# Patient Record
Sex: Male | Born: 1960 | Race: Black or African American | Hispanic: No | Marital: Single | Smoking: Current every day smoker
Health system: Southern US, Community
[De-identification: ages and names within clinical notes are randomized; demographics above are authoritative.]

## PROBLEM LIST (undated history)

## (undated) DIAGNOSIS — Z720 Tobacco use: Secondary | ICD-10-CM

## (undated) DIAGNOSIS — I639 Cerebral infarction, unspecified: Secondary | ICD-10-CM

## (undated) DIAGNOSIS — I82409 Acute embolism and thrombosis of unspecified deep veins of unspecified lower extremity: Secondary | ICD-10-CM

## (undated) DIAGNOSIS — E669 Obesity, unspecified: Secondary | ICD-10-CM

## (undated) DIAGNOSIS — E1169 Type 2 diabetes mellitus with other specified complication: Secondary | ICD-10-CM

## (undated) DIAGNOSIS — I739 Peripheral vascular disease, unspecified: Secondary | ICD-10-CM

## (undated) DIAGNOSIS — I63332 Cerebral infarction due to thrombosis of left posterior cerebral artery: Secondary | ICD-10-CM

## (undated) DIAGNOSIS — R569 Unspecified convulsions: Secondary | ICD-10-CM

## (undated) DIAGNOSIS — E785 Hyperlipidemia, unspecified: Secondary | ICD-10-CM

## (undated) DIAGNOSIS — I2699 Other pulmonary embolism without acute cor pulmonale: Secondary | ICD-10-CM

## (undated) DIAGNOSIS — F1090 Alcohol use, unspecified, uncomplicated: Secondary | ICD-10-CM

## (undated) DIAGNOSIS — IMO0002 Reserved for concepts with insufficient information to code with codable children: Secondary | ICD-10-CM

## (undated) HISTORY — DX: Cerebral infarction, unspecified: I63.9

## (undated) HISTORY — DX: Hyperlipidemia, unspecified: E78.5

## (undated) HISTORY — DX: Tobacco use: Z72.0

## (undated) HISTORY — DX: Type 2 diabetes mellitus with other specified complication: E66.9

## (undated) HISTORY — DX: Morbid (severe) obesity due to excess calories: E66.01

## (undated) HISTORY — DX: Reserved for concepts with insufficient information to code with codable children: IMO0002

## (undated) HISTORY — DX: Alcohol use, unspecified, uncomplicated: F10.90

## (undated) HISTORY — DX: Cerebral infarction due to thrombosis of left posterior cerebral artery: I63.332

## (undated) HISTORY — DX: Obesity, unspecified: E11.69

---

## 2016-02-04 ENCOUNTER — Other Ambulatory Visit: Payer: Self-pay

## 2016-02-04 ENCOUNTER — Inpatient Hospital Stay (HOSPITAL_COMMUNITY): Payer: Medicaid Other

## 2016-02-04 ENCOUNTER — Emergency Department (HOSPITAL_COMMUNITY): Payer: Medicaid Other

## 2016-02-04 ENCOUNTER — Inpatient Hospital Stay (HOSPITAL_COMMUNITY)
Admission: EM | Admit: 2016-02-04 | Discharge: 2016-02-10 | DRG: 041 | Disposition: A | Discharge status: Skilled Nursing Facility | Payer: Medicaid Other | Attending: Internal Medicine | Admitting: Internal Medicine

## 2016-02-04 ENCOUNTER — Encounter (HOSPITAL_COMMUNITY): Payer: Self-pay | Admitting: Emergency Medicine

## 2016-02-04 DIAGNOSIS — R7989 Other specified abnormal findings of blood chemistry: Secondary | ICD-10-CM | POA: Diagnosis not present

## 2016-02-04 DIAGNOSIS — E1169 Type 2 diabetes mellitus with other specified complication: Secondary | ICD-10-CM

## 2016-02-04 DIAGNOSIS — Z6835 Body mass index (BMI) 35.0-35.9, adult: Secondary | ICD-10-CM | POA: Diagnosis not present

## 2016-02-04 DIAGNOSIS — F1290 Cannabis use, unspecified, uncomplicated: Secondary | ICD-10-CM | POA: Diagnosis present

## 2016-02-04 DIAGNOSIS — Z720 Tobacco use: Secondary | ICD-10-CM | POA: Diagnosis present

## 2016-02-04 DIAGNOSIS — R262 Difficulty in walking, not elsewhere classified: Secondary | ICD-10-CM | POA: Diagnosis present

## 2016-02-04 DIAGNOSIS — R531 Weakness: Secondary | ICD-10-CM

## 2016-02-04 DIAGNOSIS — R471 Dysarthria and anarthria: Secondary | ICD-10-CM | POA: Diagnosis present

## 2016-02-04 DIAGNOSIS — G8191 Hemiplegia, unspecified affecting right dominant side: Secondary | ICD-10-CM | POA: Diagnosis present

## 2016-02-04 DIAGNOSIS — E041 Nontoxic single thyroid nodule: Secondary | ICD-10-CM | POA: Diagnosis present

## 2016-02-04 DIAGNOSIS — F1090 Alcohol use, unspecified, uncomplicated: Secondary | ICD-10-CM | POA: Diagnosis present

## 2016-02-04 DIAGNOSIS — I6789 Other cerebrovascular disease: Secondary | ICD-10-CM | POA: Diagnosis not present

## 2016-02-04 DIAGNOSIS — R488 Other symbolic dysfunctions: Secondary | ICD-10-CM | POA: Diagnosis present

## 2016-02-04 DIAGNOSIS — I63532 Cerebral infarction due to unspecified occlusion or stenosis of left posterior cerebral artery: Secondary | ICD-10-CM | POA: Diagnosis not present

## 2016-02-04 DIAGNOSIS — I10 Essential (primary) hypertension: Secondary | ICD-10-CM | POA: Diagnosis present

## 2016-02-04 DIAGNOSIS — F17210 Nicotine dependence, cigarettes, uncomplicated: Secondary | ICD-10-CM | POA: Diagnosis present

## 2016-02-04 DIAGNOSIS — R479 Unspecified speech disturbances: Secondary | ICD-10-CM | POA: Diagnosis present

## 2016-02-04 DIAGNOSIS — E785 Hyperlipidemia, unspecified: Secondary | ICD-10-CM | POA: Diagnosis present

## 2016-02-04 DIAGNOSIS — I639 Cerebral infarction, unspecified: Secondary | ICD-10-CM | POA: Diagnosis present

## 2016-02-04 DIAGNOSIS — R4701 Aphasia: Secondary | ICD-10-CM | POA: Diagnosis present

## 2016-02-04 DIAGNOSIS — I63432 Cerebral infarction due to embolism of left posterior cerebral artery: Secondary | ICD-10-CM | POA: Insufficient documentation

## 2016-02-04 DIAGNOSIS — E119 Type 2 diabetes mellitus without complications: Secondary | ICD-10-CM | POA: Diagnosis present

## 2016-02-04 DIAGNOSIS — F1099 Alcohol use, unspecified with unspecified alcohol-induced disorder: Secondary | ICD-10-CM | POA: Diagnosis not present

## 2016-02-04 DIAGNOSIS — H5340 Unspecified visual field defects: Secondary | ICD-10-CM | POA: Diagnosis present

## 2016-02-04 DIAGNOSIS — IMO0002 Reserved for concepts with insufficient information to code with codable children: Secondary | ICD-10-CM | POA: Diagnosis present

## 2016-02-04 DIAGNOSIS — R809 Proteinuria, unspecified: Secondary | ICD-10-CM | POA: Diagnosis present

## 2016-02-04 DIAGNOSIS — F1020 Alcohol dependence, uncomplicated: Secondary | ICD-10-CM

## 2016-02-04 DIAGNOSIS — E669 Obesity, unspecified: Secondary | ICD-10-CM

## 2016-02-04 DIAGNOSIS — I63332 Cerebral infarction due to thrombosis of left posterior cerebral artery: Secondary | ICD-10-CM | POA: Diagnosis present

## 2016-02-04 HISTORY — DX: Cerebral infarction, unspecified: I63.9

## 2016-02-04 LAB — HEPATIC FUNCTION PANEL
ALT: 25 U/L (ref 10–40)
AST: 47 U/L — AB (ref 14–40)
Alkaline Phosphatase: 55 U/L (ref 25–125)
BILIRUBIN, TOTAL: 1.7 mg/dL

## 2016-02-04 LAB — COMPREHENSIVE METABOLIC PANEL
ALBUMIN: 4.3 g/dL (ref 3.5–5.0)
ALK PHOS: 55 U/L (ref 38–126)
ALT: 25 U/L (ref 17–63)
AST: 47 U/L — AB (ref 15–41)
Anion gap: 9 (ref 5–15)
BILIRUBIN TOTAL: 1.7 mg/dL — AB (ref 0.3–1.2)
BUN: 15 mg/dL (ref 6–20)
CO2: 23 mmol/L (ref 22–32)
CREATININE: 0.82 mg/dL (ref 0.61–1.24)
Calcium: 9.2 mg/dL (ref 8.9–10.3)
Chloride: 104 mmol/L (ref 101–111)
GFR calc Af Amer: >60 mL/min (ref >60)
GLUCOSE: 139 mg/dL — AB (ref 65–99)
POTASSIUM: 4.9 mmol/L (ref 3.5–5.1)
Sodium: 136 mmol/L (ref 135–145)

## 2016-02-04 LAB — URINALYSIS, ROUTINE W REFLEX MICROSCOPIC
BILIRUBIN URINE: NEGATIVE
Glucose, UA: NEGATIVE mg/dL
KETONES UR: NEGATIVE mg/dL
LEUKOCYTES UA: NEGATIVE
NITRITE: NEGATIVE
PH: 5.5 (ref 5.0–8.0)
PROTEIN: 100 mg/dL — AB
Specific Gravity, Urine: 1.036 — ABNORMAL HIGH (ref 1.005–1.030)

## 2016-02-04 LAB — I-STAT TROPONIN, ED: Troponin i, poc: 0 ng/mL (ref 0.00–0.08)

## 2016-02-04 LAB — I-STAT CHEM 8, ED
BUN: 21 mg/dL — AB (ref 6–20)
CALCIUM ION: 1.06 mmol/L — AB (ref 1.12–1.23)
CREATININE: 0.8 mg/dL (ref 0.61–1.24)
Chloride: 104 mmol/L (ref 101–111)
GLUCOSE: 141 mg/dL — AB (ref 65–99)
HEMATOCRIT: 51 % (ref 39.0–52.0)
HEMOGLOBIN: 17.3 g/dL — AB (ref 13.0–17.0)
Potassium: 4.3 mmol/L (ref 3.5–5.1)
Sodium: 138 mmol/L (ref 135–145)
TCO2: 26 mmol/L (ref 0–100)

## 2016-02-04 LAB — CBC AND DIFFERENTIAL
HCT: 47 % (ref 41–53)
Hemoglobin: 16.7 g/dL (ref 13.5–17.5)
PLATELETS: 148 10*3/uL — AB (ref 150–399)
WBC: 9.9 10*3/mL

## 2016-02-04 LAB — CBC
HCT: 45.9 % (ref 39.0–52.0)
HEMATOCRIT: 47.4 % (ref 39.0–52.0)
HEMOGLOBIN: 16.7 g/dL (ref 13.0–17.0)
Hemoglobin: 15.8 g/dL (ref 13.0–17.0)
MCH: 32.3 pg (ref 26.0–34.0)
MCH: 31.7 pg (ref 26.0–34.0)
MCHC: 35.2 g/dL (ref 30.0–36.0)
MCHC: 34.4 g/dL (ref 30.0–36.0)
MCV: 91.7 fL (ref 78.0–100.0)
MCV: 92 fL (ref 78.0–100.0)
PLATELETS: 144 10*3/uL — AB (ref 150–400)
Platelets: 148 10*3/uL — ABNORMAL LOW (ref 150–400)
RBC: 5.17 MIL/uL (ref 4.22–5.81)
RBC: 4.99 MIL/uL (ref 4.22–5.81)
RDW: 13.2 % (ref 11.5–15.5)
RDW: 13.3 % (ref 11.5–15.5)
WBC: 9.9 10*3/uL (ref 4.0–10.5)
WBC: 9.6 10*3/uL (ref 4.0–10.5)

## 2016-02-04 LAB — DIFFERENTIAL
BASOS ABS: 0 10*3/uL (ref 0.0–0.1)
Basophils Relative: 0 %
EOS ABS: 0.1 10*3/uL (ref 0.0–0.7)
Eosinophils Relative: 1 %
LYMPHS ABS: 2.2 10*3/uL (ref 0.7–4.0)
LYMPHS PCT: 22 %
MONOS PCT: 6 %
Monocytes Absolute: 0.6 10*3/uL (ref 0.1–1.0)
NEUTROS ABS: 7.2 10*3/uL (ref 1.7–7.7)
NEUTROS PCT: 71 %

## 2016-02-04 LAB — TROPONIN I: Troponin I: 0.03 ng/mL (ref <0.031)

## 2016-02-04 LAB — BASIC METABOLIC PANEL
BUN: 15 mg/dL (ref 4–21)
CREATININE: 0.8 mg/dL (ref 0.6–1.3)
GLUCOSE: 139 mg/dL
Potassium: 4.9 mmol/L (ref 3.4–5.3)
Sodium: 136 mmol/L — AB (ref 137–147)

## 2016-02-04 LAB — RAPID URINE DRUG SCREEN, HOSP PERFORMED
Amphetamines: NOT DETECTED
Barbiturates: NOT DETECTED
Benzodiazepines: NOT DETECTED
Cocaine: NOT DETECTED
OPIATES: NOT DETECTED
TETRAHYDROCANNABINOL: POSITIVE — AB

## 2016-02-04 LAB — CREATININE, SERUM
Creatinine, Ser: 0.88 mg/dL (ref 0.61–1.24)
GFR calc Af Amer: >60 mL/min (ref >60)
GFR calc non Af Amer: >60 mL/min (ref >60)

## 2016-02-04 LAB — PROTIME-INR
INR: 1.01 (ref 0.00–1.49)
Prothrombin Time: 13.5 seconds (ref 11.6–15.2)

## 2016-02-04 LAB — APTT: APTT: 29 s (ref 24–37)

## 2016-02-04 LAB — URINE MICROSCOPIC-ADD ON: Bacteria, UA: NONE SEEN

## 2016-02-04 LAB — ETHANOL: Alcohol, Ethyl (B): <5 mg/dL (ref <5)

## 2016-02-04 LAB — CBG MONITORING, ED: Glucose-Capillary: 137 mg/dL — ABNORMAL HIGH (ref 65–99)

## 2016-02-04 MED ORDER — VITAMIN B-1 100 MG PO TABS
100.0000 mg | ORAL_TABLET | Freq: Every day | ORAL | Status: DC
  Administered 2016-02-05 – 2016-02-10 (×5): 100 mg via ORAL
  Filled 2016-02-04 (×6): qty 1

## 2016-02-04 MED ORDER — ACETAMINOPHEN 325 MG PO TABS
650.0000 mg | ORAL_TABLET | Freq: Four times a day (QID) | ORAL | Status: DC | PRN
  Administered 2016-02-04: 650 mg via ORAL
  Filled 2016-02-04: qty 2

## 2016-02-04 MED ORDER — NICOTINE 21 MG/24HR TD PT24
21.0000 mg | MEDICATED_PATCH | Freq: Every day | TRANSDERMAL | Status: DC
Start: 2016-02-04 — End: 2016-02-10
  Administered 2016-02-04 – 2016-02-10 (×7): 21 mg via TRANSDERMAL
  Filled 2016-02-04 (×7): qty 1

## 2016-02-04 MED ORDER — ACETAMINOPHEN 500 MG PO TABS: 1000.0000 mg | ORAL_TABLET | Freq: Three times a day (TID) | ORAL | Status: DC | PRN

## 2016-02-04 MED ORDER — IBUPROFEN 200 MG PO TABS
600.0000 mg | ORAL_TABLET | Freq: Four times a day (QID) | ORAL | Status: DC | PRN
  Administered 2016-02-04 – 2016-02-06 (×4): 600 mg via ORAL
  Filled 2016-02-04 (×4): qty 3

## 2016-02-04 MED ORDER — SODIUM CHLORIDE 0.9 % IV SOLN: INTRAVENOUS | Status: AC

## 2016-02-04 MED ORDER — ENOXAPARIN SODIUM 60 MG/0.6ML ~~LOC~~ SOLN
0.5000 mg/kg | SUBCUTANEOUS | Status: DC
  Administered 2016-02-04 – 2016-02-08 (×5): 60 mg via SUBCUTANEOUS
  Filled 2016-02-04 (×7): qty 0.6

## 2016-02-04 MED ORDER — FENTANYL CITRATE (PF) 100 MCG/2ML IJ SOLN
50.0000 ug | Freq: Once | INTRAMUSCULAR | Status: AC
  Administered 2016-02-04: 50 ug via INTRAVENOUS
  Filled 2016-02-04: qty 2

## 2016-02-04 MED ORDER — LORAZEPAM 1 MG PO TABS: 0.0000 mg | ORAL_TABLET | Freq: Two times a day (BID) | ORAL | Status: DC

## 2016-02-04 MED ORDER — ADULT MULTIVITAMIN W/MINERALS CH
1.0000 | ORAL_TABLET | Freq: Every day | ORAL | Status: DC
  Administered 2016-02-05 – 2016-02-07 (×3): 1 via ORAL
  Filled 2016-02-04 (×3): qty 1

## 2016-02-04 MED ORDER — METOCLOPRAMIDE HCL 5 MG/ML IJ SOLN
10.0000 mg | Freq: Once | INTRAMUSCULAR | Status: AC
  Administered 2016-02-04: 10 mg via INTRAVENOUS
  Filled 2016-02-04: qty 2

## 2016-02-04 MED ORDER — ASPIRIN 325 MG PO TABS
325.0000 mg | ORAL_TABLET | Freq: Every day | ORAL | Status: DC
  Administered 2016-02-04 – 2016-02-10 (×7): 325 mg via ORAL
  Filled 2016-02-04 (×7): qty 1

## 2016-02-04 MED ORDER — THIAMINE HCL 100 MG/ML IJ SOLN: 100.0000 mg | Freq: Every day | INTRAMUSCULAR | Status: DC

## 2016-02-04 MED ORDER — FOLIC ACID 1 MG PO TABS
1.0000 mg | ORAL_TABLET | Freq: Every day | ORAL | Status: DC
  Administered 2016-02-05 – 2016-02-07 (×3): 1 mg via ORAL
  Filled 2016-02-04 (×3): qty 1

## 2016-02-04 MED ORDER — LORAZEPAM 2 MG/ML IJ SOLN: 1.0000 mg | Freq: Four times a day (QID) | INTRAMUSCULAR | Status: DC | PRN

## 2016-02-04 MED ORDER — LORAZEPAM 1 MG PO TABS
1.0000 mg | ORAL_TABLET | Freq: Four times a day (QID) | ORAL | Status: DC | PRN
  Administered 2016-02-05 – 2016-02-06 (×2): 1 mg via ORAL

## 2016-02-04 MED ORDER — LORAZEPAM 1 MG PO TABS
0.0000 mg | ORAL_TABLET | Freq: Four times a day (QID) | ORAL | Status: AC
  Administered 2016-02-05: 2 mg via ORAL
  Administered 2016-02-06: 1 mg via ORAL
  Filled 2016-02-04: qty 2
  Filled 2016-02-04: qty 1

## 2016-02-04 MED ORDER — IOPAMIDOL (ISOVUE-370) INJECTION 76%
INTRAVENOUS | Status: AC
  Administered 2016-02-04: 50 mL
  Filled 2016-02-04: qty 50

## 2016-02-04 MED ORDER — STROKE: EARLY STAGES OF RECOVERY BOOK
Freq: Once | Status: AC
  Administered 2016-02-04: 22:00:00
  Filled 2016-02-04: qty 1

## 2016-02-04 MED ORDER — SODIUM CHLORIDE 0.9 % IV SOLN: 100.0000 mL/h | INTRAVENOUS | Status: DC

## 2016-02-04 MED ORDER — ENOXAPARIN SODIUM 40 MG/0.4ML ~~LOC~~ SOLN: 40.0000 mg | SUBCUTANEOUS | Status: DC

## 2016-02-04 MED ORDER — SENNOSIDES-DOCUSATE SODIUM 8.6-50 MG PO TABS: 1.0000 | ORAL_TABLET | Freq: Every evening | ORAL | Status: DC | PRN

## 2016-02-04 MED ORDER — ASPIRIN 300 MG RE SUPP: 300.0000 mg | Freq: Every day | RECTAL | Status: DC

## 2016-02-04 MED ORDER — SODIUM CHLORIDE 0.9 % IV BOLUS (SEPSIS)
500.0000 mL | Freq: Once | INTRAVENOUS | Status: AC
  Administered 2016-02-04: 500 mL via INTRAVENOUS

## 2016-02-04 NOTE — Progress Notes (Signed)
Patient admitted to room 5M11 at 1750. Alert and in stable condition.

## 2016-02-04 NOTE — ED Notes (Signed)
Pt arrives via [facility] for c/o right arm weakness that began 2 weeks ago, pt reports on Monday his right leg became weak and he was unable to walk. Pt presents with some slurred speech. Ems reports hand grips equal bilaterally and no leg drift was present.

## 2016-02-04 NOTE — ED Notes (Signed)
Dr. Patel at bedside 

## 2016-02-04 NOTE — ED Notes (Signed)
Patient called out for urinal and was moving all extremities at this time and has no further complaints. Asked to turn lights off and close curtains behind

## 2016-02-04 NOTE — Progress Notes (Signed)
CSW engaged with Patient's sister in law outside of Patient's room at Patient's sister in law's request. Patient's sister expressed concerns with Patient not having any insurance. CSW explained that Patient will be contacted by a financial counselor once inpatient. Patient's sister inquired about rehab or home health services upon discharge. CSW explained the SNF process. Patient to be admitted for further evaluation and management. During that time, Patient to be assessed by PT and OT to determine disposition. Patient's sister in law appreciative of CSW's visit. No further needs/concerns at this time.          Lance MussAshley Gardner,MSW, LCSW [facility] ED/50M Clinical Social Worker [phone]

## 2016-02-04 NOTE — H&P (Signed)
Date: 02/04/2016               Patient Name:  Ronald Forbes MRN: [id number]  DOB: [birth year] Age / Sex: 55 y.o., male   PCP: No primary care provider on file.         Medical Service: Internal Medicine Teaching Service         Attending Physician: Dr. Aldine Contes, MD    First Contact: Dr. Zada Finders Pager: [phone]  Second Contact: Dr. Charlott Rakes Pager: [phone]       After Hours (After 5p/  First Contact Pager: [phone]  weekends / holidays): Second Contact Pager: [phone]   Chief Complaint: "I cant move my right side"  History of Present Illness:  Ronald Forbes is a 55 year old male with PMH of alcohol and tobacco use who presented to the ED with slurred speech and right sided weakness. History is limited due to patient status. Patient states that he began to have severe headaches about 2 weeks ago that were not relieved with OTC tylenol. He says the headaches progressively worsened and he noticed numbness/tingling and some weakness in his right side. He says this slowly worsened until yesterday when he was unable to move his right arm or leg. He reports slurred speech with trouble speaking his intended words, but understands when spoken to and recognizes when he is saying words he does not mean to say. He reports decreased sensation on the right side of his face.  In the ED, CT head showed a large cortical infarct involving the posterior left parietal and occipital lobes extending into the posterior left temporal lobe, likely subacute. An old infarct of the left basal ganglia was also seen. CTA Head and Neck showed a large subacute infarct of the left PCA territory. The left posterior cerebral artery is occluded. A 2 cm right thyroid lesion was incidentally seen.  Social Hx: 1 PPD for 40 years, drinks 6 pack daily, occasional marijuana, no cocaine/heroin/IVDU  Family Hx: No known family history.  Meds: Current Facility-Administered Medications  Medication Dose Route  Frequency Provider Last Rate Last Dose  .  stroke: mapping our early stages of recovery book   Does not apply Once Zada Finders, MD      . 0.9 %  sodium chloride infusion  100 mL/hr Intravenous Continuous Carmin Muskrat, MD      . 0.9 %  sodium chloride infusion   Intravenous Continuous Zada Finders, MD      . aspirin suppository 300 mg  300 mg Rectal Daily Zada Finders, MD       Or  . aspirin tablet 325 mg  325 mg Oral Daily Zada Finders, MD   325 mg at 02/04/16 1641  . enoxaparin (LOVENOX) injection 60 mg  0.5 mg/kg Subcutaneous Q24H Nischal Narendra, MD      . folic acid (FOLVITE) tablet 1 mg  1 mg Oral Daily Riccardo Dubin, MD      . ibuprofen (ADVIL,MOTRIN) tablet 600 mg  600 mg Oral Q6H PRN Zada Finders, MD      . LORazepam (ATIVAN) tablet 1 mg  1 mg Oral Q6H PRN Riccardo Dubin, MD       Or  . LORazepam (ATIVAN) injection 1 mg  1 mg Intravenous Q6H PRN Riccardo Dubin, MD      . LORazepam (ATIVAN) tablet 0-4 mg  0-4 mg Oral Q6H Riccardo Dubin, MD       Followed by  . [  START ON 02/06/2016] LORazepam (ATIVAN) tablet 0-4 mg  0-4 mg Oral Q12H Riccardo Dubin, MD      . metoCLOPramide (REGLAN) injection 10 mg  10 mg Intravenous Once Greta Doom, MD      . multivitamin with minerals tablet 1 tablet  1 tablet Oral Daily Riccardo Dubin, MD      . nicotine (NICODERM CQ - dosed in mg/24 hours) patch 21 mg  21 mg Transdermal Daily Zada Finders, MD   21 mg at 02/04/16 1642  . senna-docusate (Senokot-S) tablet 1 tablet  1 tablet Oral QHS PRN Zada Finders, MD      . thiamine (VITAMIN B-1) tablet 100 mg  100 mg Oral Daily Riccardo Dubin, MD       Or  . thiamine (B-1) injection 100 mg  100 mg Intravenous Daily Riccardo Dubin, MD        Allergies: Allergies as of 02/04/2016  . (No Known Allergies)   History reviewed. No pertinent past medical history. History reviewed. No pertinent past surgical history. No family history on file. Social History   Social History  . Marital Status:  Single    Spouse Name: N/A  . Number of Children: N/A  . Years of Education: N/A   Occupational History  . Not on file.   Social History Main Topics  . Smoking status: Current Every Day Smoker -- 1.00 packs/day for 40 years    Types: Cigarettes  . Smokeless tobacco: Not on file  . Alcohol Use: 0.0 oz/week    0 Standard drinks or equivalent per week     Comment: 6 pack/day  . Drug Use: Yes    Special: Marijuana     Comment: occasional marijuana  . Sexual Activity: Not on file   Other Topics Concern  . Not on file   Social History Narrative   Lives alone   Used to work as a Dealer    Review of Systems: Review of Systems  Constitutional: Negative for fever and chills.  HENT: Negative for hearing loss and tinnitus.   Eyes: Negative for blurred vision and double vision.  Respiratory: Negative for cough, sputum production and shortness of breath.   Cardiovascular: Negative for chest pain, palpitations and leg swelling.  Gastrointestinal: Negative for nausea, vomiting, abdominal pain, diarrhea and constipation.  Genitourinary: Negative for dysuria and frequency.  Musculoskeletal: Negative for myalgias and falls.  Neurological: Positive for tingling, sensory change, speech change, focal weakness, weakness and headaches. Negative for dizziness, seizures and loss of consciousness.  Psychiatric/Behavioral: Negative for memory loss.     Physical Exam: Blood pressure 156/78, pulse 58, temperature 98.2 F (36.8 C), temperature source Oral, resp. rate 20, height _0  (1.905 m), weight 269 lb 6.4 oz (122.2 kg), SpO2 97 %. Physical Exam  Constitutional: He appears well-developed and well-nourished.  Left pupil more constricted compared to right, both reactive to light, EOMI  HENT:  Head: Normocephalic and atraumatic.  Mouth/Throat: Oropharynx is clear and moist.  Neck: Normal range of motion.  Cardiovascular: Normal rate and regular rhythm.   No murmur heard. Pulmonary/Chest:  Effort normal. No respiratory distress. He has no wheezes. He has no rales.  Abdominal: Soft. Bowel sounds are normal. He exhibits no distension. There is no tenderness. There is no guarding.  Musculoskeletal: He exhibits no edema or tenderness.  Neurological: He is alert.  Oriented to city and hospital, birth month and day, says "2003" for birth year and current year. Speech is  slurred with occasional aphasia. Slight right sided facial droop, decreased sensation on right side of face. RUE 3/5, RLE 3/5, LUE and LLE 5/5, diminished plantar and dorsiflexion on RLE, unable to illicit PT reflexes, finger to nose and rapid finger tap intact on left, decreased ability on right due to weakness. Gait not assessed due to fall risk.  Skin: He is not diaphoretic.     Lab results: Basic Metabolic Panel:  Recent Labs  02/04/16 0849 02/04/16 0859 02/04/16 1601  NA 136 138  --   K 4.9 4.3  --   CL 104 104  --   CO2 23  --   --   GLUCOSE 139* 141*  --   BUN 15 21*  --   CREATININE 0.82 0.80 0.88  CALCIUM 9.2  --   --    Liver Function Tests:  Recent Labs  02/04/16 0849  AST 47*  ALT 25  ALKPHOS 55  BILITOT 1.7*  PROT >12.0*  ALBUMIN 4.3   No results for input(s): LIPASE, AMYLASE in the last 72 hours. No results for input(s): AMMONIA in the last 72 hours. CBC:  Recent Labs  02/04/16 0849 02/04/16 0859 02/04/16 1601  WBC 9.9  --  9.6  NEUTROABS 7.2  --   --   HGB 16.7 17.3* 15.8  HCT 47.4 51.0 45.9  MCV 91.7  --  92.0  PLT 148*  --  144*   Cardiac Enzymes:  Recent Labs  02/04/16 0849  TROPONINI 0.03   BNP: No results for input(s): PROBNP in the last 72 hours. D-Dimer: No results for input(s): DDIMER in the last 72 hours. CBG:  Recent Labs  02/04/16 0850  GLUCAP 137*   Hemoglobin A1C: No results for input(s): HGBA1C in the last 72 hours. Fasting Lipid Panel: No results for input(s): CHOL, HDL, LDLCALC, TRIG, CHOLHDL, LDLDIRECT in the last 72 hours. Thyroid  Function Tests: No results for input(s): TSH, T4TOTAL, FREET4, T3FREE, THYROIDAB in the last 72 hours. Anemia Panel: No results for input(s): VITAMINB12, FOLATE, FERRITIN, TIBC, IRON, RETICCTPCT in the last 72 hours. Coagulation:  Recent Labs  02/04/16 0849  LABPROT 13.5  INR 1.01   Urine Drug Screen: Drugs of Abuse     Component Value Date/Time   LABOPIA NONE DETECTED 02/04/2016 1025   COCAINSCRNUR NONE DETECTED 02/04/2016 1025   LABBENZ NONE DETECTED 02/04/2016 1025   AMPHETMU NONE DETECTED 02/04/2016 1025   THCU POSITIVE* 02/04/2016 1025   LABBARB NONE DETECTED 02/04/2016 1025    Alcohol Level:  Recent Labs  02/04/16 1102  ETH <5   Urinalysis:  Recent Labs  02/04/16 1025  COLORURINE YELLOW  LABSPEC 1.036*  PHURINE 5.5  GLUCOSEU NEGATIVE  HGBUR TRACE*  BILIRUBINUR NEGATIVE  KETONESUR NEGATIVE  PROTEINUR 100*  NITRITE NEGATIVE  LEUKOCYTESUR NEGATIVE     Imaging results:  Ct Angio Head W Or Wo Contrast  02/04/2016  CLINICAL DATA:  Stroke. Right-sided weakness 2 weeks. Slurred speech. EXAM: CT ANGIOGRAPHY HEAD AND NECK TECHNIQUE: Multidetector CT imaging of the head and neck was performed using the standard protocol during bolus administration of intravenous contrast. Multiplanar CT image reconstructions and MIPs were obtained to evaluate the vascular anatomy. Carotid stenosis measurements (when applicable) are obtained utilizing NASCET criteria, using the distal internal carotid diameter as the denominator. CONTRAST:  50 mL Isovue 370 IV COMPARISON:  CT 02/04/2016 FINDINGS: CTA NECK Aortic arch: Normal aortic arch. Lung apices clear. Proximal great vessels widely patent. Right carotid system: Mild atherosclerotic disease  at the right carotid bifurcation without stenosis. No dissection Left carotid system: Mild atherosclerotic disease at the bifurcation without stenosis or dissection. Vertebral arteries:Right vertebral artery dominant. Right vertebral artery widely  patent to the basilar. Left vertebral artery is small and ends in PICA without significant contribution to the basilar. Skeleton: Cervical degenerative change.  No fracture or bony lesion. Other neck: Right thyroid lesion measuring 20 mm. This has irregular margins and is noncalcified. Thyroid ultrasound and possible biopsy recommended to rule out neoplasm. CTA HEAD Anterior circulation: Mild atherosclerotic calcified gauge in the cavernous carotid bilaterally without significant stenosis. Anterior and middle cerebral arteries widely patent bilaterally without stenosis. Posterior circulation: Right vertebral artery patent to the basilar. Right PICA patent. Left vertebral artery hypoplastic ending in PICA without significant contribution to the basilar. Basilar widely patent. Superior cerebellar artery patent bilaterally. Posterior communicating artery patent bilaterally. Right posterior cerebral artery widely patent Left posterior cerebral artery occluded at the origin with possible faint reconstitution distally. Large subacute infarct left PCA territory. Venous sinuses: Patent Anatomic variants: negative for cerebral aneurysm Delayed phase: Slight enhancement along the periphery of the left PCA infarct compatible with subacute infarction. No enhancing mass lesion. IMPRESSION: Large subacute infarct left PCA territory Left posterior cerebral artery is occluded with faint reconstitution in the midportion. No significant carotid or vertebral artery stenosis in the neck. 2 cm right thyroid lesion. Recommend thyroid ultrasound and biopsy to rule out neoplasm. Electronically Signed   By: Franchot Gallo M.D.   On: 02/04/2016 10:34   Ct Head Wo Contrast  02/04/2016  CLINICAL DATA:  Code stroke earlier today. Right-sided weakness for 2 weeks. Left PCA infarct. EXAM: CT HEAD WITHOUT CONTRAST TECHNIQUE: Contiguous axial images were obtained from the base of the skull through the vertex without intravenous contrast.  COMPARISON:  CTA earlier same date. FINDINGS: 1708 hours. Brain: Large subacute infarct in the left posterior cerebral artery distribution is again noted, involving the medial left temporal, parietal and occipital lobes. This has not significantly changed compared with the prior examination. There are chronic small vessel ischemic changes in the left basal ganglia. There is no midline shift, hydrocephalus or evidence of acute intracranial hemorrhage. Intracranial vascular calcifications are noted. Bones/sinuses/visualized face: The visualized paranasal sinuses, mastoid air cells and middle ears are clear. Cold lamina papyracea deformity on the right is noted. The calvarium is intact. IMPRESSION: 1. Stable large subacute left PCA distribution infarct from examination earlier today. 2. No new findings. Electronically Signed   By: Richardean Sale M.D.   On: 02/04/2016 17:39   Ct Head Wo Contrast  02/04/2016  ADDENDUM REPORT: 02/04/2016 09:49 ADDENDUM: Findings discussed with Dr. Leonel Ramsay on 02/04/2016 at 0945 hours. Electronically Signed   By: Lavonia Dana M.D.   On: 02/04/2016 09:49  02/04/2016  CLINICAL DATA:  Slurred speech, stroke symptoms, onset 2 weeks ago but worse this morning EXAM: CT HEAD WITHOUT CONTRAST TECHNIQUE: Contiguous axial images were obtained from the base of the skull through the vertex without intravenous contrast. COMPARISON:  None FINDINGS: Normal ventricular morphology. No midline shift. Large area of low attenuation involving the LEFT occipital and posterior parietal regions compatible with a LEFT PCA territory infarct. This extends into the posterior LEFT temporal lobe. Mild mass effect upon the atrium of the LEFT lateral ventricle. Old lacunar infarct anterior LEFT basal ganglia into anterior limb LEFT internal capsule. No intracranial hemorrhage or mass lesion. Posterior fossa unremarkable. No extra-axial fluid collections. Bones and sinuses unremarkable. IMPRESSION: Large area of  cortical infarction involving the posterior LEFT parietal and LEFT occipital lobes extending into posterior LEFT temporal lobe, probably subacute in age. Old appearing lacunar infarct anterior LEFT basal ganglia into the anterior limb of LEFT internal capsule. No midline shift or intracranial hemorrhage identified. Electronically Signed: By: Lavonia Dana M.D. On: 02/04/2016 09:32   Ct Angio Neck W Or Wo Contrast  02/04/2016  CLINICAL DATA:  Stroke. Right-sided weakness 2 weeks. Slurred speech. EXAM: CT ANGIOGRAPHY HEAD AND NECK TECHNIQUE: Multidetector CT imaging of the head and neck was performed using the standard protocol during bolus administration of intravenous contrast. Multiplanar CT image reconstructions and MIPs were obtained to evaluate the vascular anatomy. Carotid stenosis measurements (when applicable) are obtained utilizing NASCET criteria, using the distal internal carotid diameter as the denominator. CONTRAST:  50 mL Isovue 370 IV COMPARISON:  CT 02/04/2016 FINDINGS: CTA NECK Aortic arch: Normal aortic arch. Lung apices clear. Proximal great vessels widely patent. Right carotid system: Mild atherosclerotic disease at the right carotid bifurcation without stenosis. No dissection Left carotid system: Mild atherosclerotic disease at the bifurcation without stenosis or dissection. Vertebral arteries:Right vertebral artery dominant. Right vertebral artery widely patent to the basilar. Left vertebral artery is small and ends in PICA without significant contribution to the basilar. Skeleton: Cervical degenerative change.  No fracture or bony lesion. Other neck: Right thyroid lesion measuring 20 mm. This has irregular margins and is noncalcified. Thyroid ultrasound and possible biopsy recommended to rule out neoplasm. CTA HEAD Anterior circulation: Mild atherosclerotic calcified gauge in the cavernous carotid bilaterally without significant stenosis. Anterior and middle cerebral arteries widely patent  bilaterally without stenosis. Posterior circulation: Right vertebral artery patent to the basilar. Right PICA patent. Left vertebral artery hypoplastic ending in PICA without significant contribution to the basilar. Basilar widely patent. Superior cerebellar artery patent bilaterally. Posterior communicating artery patent bilaterally. Right posterior cerebral artery widely patent Left posterior cerebral artery occluded at the origin with possible faint reconstitution distally. Large subacute infarct left PCA territory. Venous sinuses: Patent Anatomic variants: negative for cerebral aneurysm Delayed phase: Slight enhancement along the periphery of the left PCA infarct compatible with subacute infarction. No enhancing mass lesion. IMPRESSION: Large subacute infarct left PCA territory Left posterior cerebral artery is occluded with faint reconstitution in the midportion. No significant carotid or vertebral artery stenosis in the neck. 2 cm right thyroid lesion. Recommend thyroid ultrasound and biopsy to rule out neoplasm. Electronically Signed   By: Franchot Gallo M.D.   On: 02/04/2016 10:34    Other results: EKG: sinus rhythm, incomplete RBBB, LAFB.  Assessment & Plan by Problem: Principal Problem:   CVA (cerebral vascular accident) ([place]) Active Problems:   Tobacco abuse   Alcohol use disorder ([place])   Morbid obesity ([place])  Left Occipital Infarct: CTA Head and Neck showed a large subacute infarct of the left PCA territory. The left posterior cerebral artery is occluded, suggesting an atherosclerotic cause. He does not follow with a PCP or have known past medical conditions, but does have risk factor in smoking history. Will continue with stroke workup and risk factor assessment and modification. -MRI Brain w/o contrast -ASA 325 mg -TTE -PT/OT/SLP eval -Check Hg A1C, Lipid panel -Admit to telemetry with neuro checks -Risk factor modification -NS @ 125 mL/hr -Permissive HTN <220/120  Elevated  total protein: AST/ALT is 47/25 with mild increased bilirubin of 1.7 and total protein elevated at >12.0. UA does show proteinuria at 100 mg/dL. Will repeat CMP in morning. Would think about Amyloidosis  or multiple myeloma if consistently elevated. -Repeat CMP -HCV antibody -HIV antibody  Thyroid nodule: 2 cm right thyroid lesion was seen on CTA.  -TSH -Consider thyroid U/S and possible biopsy  EtOH use: Reports drinking a 6 pack nightly, denies history of withdrawal or seizures. -CIWA protocol -Thiamine, Folic Acid, MVM   Tobacco use: Smoked 1 PPD for ~40 years.  -Smoking cessation counseling -Nicotine patch   Dispo: Disposition is deferred at this time, awaiting improvement of current medical problems. Anticipated discharge in approximately 1-3 day(s).   The patient does not have a current PCP (No primary care provider on file.) and does need an [facility] hospital follow-up appointment after discharge.  The patient does not have transportation limitations that hinder transportation to clinic appointments.  Signed: Zada Finders, MD 02/04/2016, 6:20 PM

## 2016-02-04 NOTE — ED Notes (Signed)
Pt now able to move right arm and right leg again. Dr. Amada JupiterKirkpatrick aware.

## 2016-02-04 NOTE — ED Notes (Signed)
Pt given meal tray.

## 2016-02-04 NOTE — ED Provider Notes (Signed)
CSN: [id number]     Arrival date & time 02/04/16  0865 History   First MD Initiated Contact with Patient 02/04/16 [phone]     Chief Complaint  Patient presents with  . Stroke Symptoms    HPI  Patient presents with concern of weakness, new speech difficulty. Symptoms began about 2 weeks ago, initially with right upper and right lower extremity weakness. Over the interval, the symptoms have increased, with difficulty walking. Patient notes that over the past day he has had new dysarthria as well. During this illness he has had no medical attention, and there are no clear alleviating or exacerbating factors. He denies pain, confusion, disorientation. He is here with 2 family members who corroborate the history of present illness.   History reviewed. No pertinent past medical history. History reviewed. No pertinent past surgical history. No family history on file. Social History  Substance Use Topics  . Smoking status: Current Every Day Smoker -- 1.00 packs/day    Types: Cigarettes  . Smokeless tobacco: None  . Alcohol Use: Yes     Comment: 40 oz/ week    Review of Systems  Constitutional:       Per HPI, otherwise negative  HENT:       Per HPI, otherwise negative  Respiratory:       Per HPI, otherwise negative  Cardiovascular:       Per HPI, otherwise negative  Gastrointestinal: Negative for vomiting.  Endocrine:       Negative aside from HPI  Genitourinary:       Neg aside from HPI   Musculoskeletal:       Per HPI, otherwise negative  Skin: Negative.   Neurological: Positive for facial asymmetry, speech difficulty, weakness, light-headedness, numbness and headaches. Negative for syncope.      Allergies  Review of patient's allergies indicates no known allergies.  Home Medications   Prior to Admission medications   Medication Sig Start Date End Date Taking? Authorizing Provider  ibuprofen (ADVIL,MOTRIN) 200 MG tablet Take 800 mg by mouth every 6 (six) hours as  needed for mild pain.   Yes Historical Provider, MD  Multiple Vitamins-Minerals (MULTIVITAMIN WITH MINERALS) tablet Take 1 tablet by mouth daily.   Yes Historical Provider, MD   BP 161/104 mmHg  Pulse 65  Temp(Src) 97.7 F (36.5 C) (Oral)  Resp 22  Ht 6\' 3"  (1.905 m)  Wt 280 lb (127.007 kg)  BMI 35.00 kg/m2  SpO2 94% Physical Exam  Constitutional: He is oriented to person, place, and time. He appears well-developed. No distress.  HENT:  Head: Normocephalic and atraumatic.  Eyes: Conjunctivae and EOM are normal.  Cardiovascular: Normal rate and regular rhythm.   Pulmonary/Chest: Effort normal. No stridor. No respiratory distress.  Abdominal: He exhibits no distension.  Musculoskeletal: He exhibits no edema.  Neurological: He is alert and oriented to person, place, and time. A cranial nerve deficit and sensory deficit is present. He exhibits abnormal muscle tone. He displays no seizure activity. Coordination abnormal.  R facial asym,  NIH 11  Skin: Skin is warm and dry.  Psychiatric: He has a normal mood and affect.  Nursing note and vitals reviewed.   ED Course  Procedures (including critical care time) Labs Review Labs Reviewed  CBC - Abnormal; Notable for the following:    Platelets 148 (*)    All other components within normal limits  COMPREHENSIVE METABOLIC PANEL - Abnormal; Notable for the following:    Glucose, Bld 139 (*)  Total Protein >12.0 (*)    AST 47 (*)    Total Bilirubin 1.7 (*)    All other components within normal limits  CBG MONITORING, ED - Abnormal; Notable for the following:    Glucose-Capillary 137 (*)    All other components within normal limits  I-STAT CHEM 8, ED - Abnormal; Notable for the following:    BUN 21 (*)    Glucose, Bld 141 (*)    Calcium, Ion 1.06 (*)    Hemoglobin 17.3 (*)    All other components within normal limits  PROTIME-INR  APTT  DIFFERENTIAL  TROPONIN I  ETHANOL  URINE RAPID DRUG SCREEN, HOSP PERFORMED  URINALYSIS,  ROUTINE W REFLEX MICROSCOPIC (NOT AT [facility])  I-STAT TROPOININ, ED  CBG MONITORING, ED    Imaging Review Ct Angio Head W Or Wo Contrast  02/04/2016  CLINICAL DATA:  Stroke. Right-sided weakness 2 weeks. Slurred speech. EXAM: CT ANGIOGRAPHY HEAD AND NECK TECHNIQUE: Multidetector CT imaging of the head and neck was performed using the standard protocol during bolus administration of intravenous contrast. Multiplanar CT image reconstructions and MIPs were obtained to evaluate the vascular anatomy. Carotid stenosis measurements (when applicable) are obtained utilizing NASCET criteria, using the distal internal carotid diameter as the denominator. CONTRAST:  50 mL Isovue 370 IV COMPARISON:  CT 02/04/2016 FINDINGS: CTA NECK Aortic arch: Normal aortic arch. Lung apices clear. Proximal great vessels widely patent. Right carotid system: Mild atherosclerotic disease at the right carotid bifurcation without stenosis. No dissection Left carotid system: Mild atherosclerotic disease at the bifurcation without stenosis or dissection. Vertebral arteries:Right vertebral artery dominant. Right vertebral artery widely patent to the basilar. Left vertebral artery is small and ends in PICA without significant contribution to the basilar. Skeleton: Cervical degenerative change.  No fracture or bony lesion. Other neck: Right thyroid lesion measuring 20 mm. This has irregular margins and is noncalcified. Thyroid ultrasound and possible biopsy recommended to rule out neoplasm. CTA HEAD Anterior circulation: Mild atherosclerotic calcified gauge in the cavernous carotid bilaterally without significant stenosis. Anterior and middle cerebral arteries widely patent bilaterally without stenosis. Posterior circulation: Right vertebral artery patent to the basilar. Right PICA patent. Left vertebral artery hypoplastic ending in PICA without significant contribution to the basilar. Basilar widely patent. Superior cerebellar artery patent  bilaterally. Posterior communicating artery patent bilaterally. Right posterior cerebral artery widely patent Left posterior cerebral artery occluded at the origin with possible faint reconstitution distally. Large subacute infarct left PCA territory. Venous sinuses: Patent Anatomic variants: negative for cerebral aneurysm Delayed phase: Slight enhancement along the periphery of the left PCA infarct compatible with subacute infarction. No enhancing mass lesion. IMPRESSION: Large subacute infarct left PCA territory Left posterior cerebral artery is occluded with faint reconstitution in the midportion. No significant carotid or vertebral artery stenosis in the neck. 2 cm right thyroid lesion. Recommend thyroid ultrasound and biopsy to rule out neoplasm. Electronically Signed   By: Marlan Palau M.D.   On: 02/04/2016 10:34   Ct Head Wo Contrast  02/04/2016  ADDENDUM REPORT: 02/04/2016 09:49 ADDENDUM: Findings discussed with Dr. Amada Jupiter on 02/04/2016 at 0945 hours. Electronically Signed   By: Ulyses Southward M.D.   On: 02/04/2016 09:49  02/04/2016  CLINICAL DATA:  Slurred speech, stroke symptoms, onset 2 weeks ago but worse this morning EXAM: CT HEAD WITHOUT CONTRAST TECHNIQUE: Contiguous axial images were obtained from the base of the skull through the vertex without intravenous contrast. COMPARISON:  None FINDINGS: Normal ventricular morphology. No midline shift.  Large area of low attenuation involving the LEFT occipital and posterior parietal regions compatible with a LEFT PCA territory infarct. This extends into the posterior LEFT temporal lobe. Mild mass effect upon the atrium of the LEFT lateral ventricle. Old lacunar infarct anterior LEFT basal ganglia into anterior limb LEFT internal capsule. No intracranial hemorrhage or mass lesion. Posterior fossa unremarkable. No extra-axial fluid collections. Bones and sinuses unremarkable. IMPRESSION: Large area of cortical infarction involving the posterior LEFT  parietal and LEFT occipital lobes extending into posterior LEFT temporal lobe, probably subacute in age. Old appearing lacunar infarct anterior LEFT basal ganglia into the anterior limb of LEFT internal capsule. No midline shift or intracranial hemorrhage identified. Electronically Signed: By: Ulyses Southward M.D. On: 02/04/2016 09:32   Ct Angio Neck W Or Wo Contrast  02/04/2016  CLINICAL DATA:  Stroke. Right-sided weakness 2 weeks. Slurred speech. EXAM: CT ANGIOGRAPHY HEAD AND NECK TECHNIQUE: Multidetector CT imaging of the head and neck was performed using the standard protocol during bolus administration of intravenous contrast. Multiplanar CT image reconstructions and MIPs were obtained to evaluate the vascular anatomy. Carotid stenosis measurements (when applicable) are obtained utilizing NASCET criteria, using the distal internal carotid diameter as the denominator. CONTRAST:  50 mL Isovue 370 IV COMPARISON:  CT 02/04/2016 FINDINGS: CTA NECK Aortic arch: Normal aortic arch. Lung apices clear. Proximal great vessels widely patent. Right carotid system: Mild atherosclerotic disease at the right carotid bifurcation without stenosis. No dissection Left carotid system: Mild atherosclerotic disease at the bifurcation without stenosis or dissection. Vertebral arteries:Right vertebral artery dominant. Right vertebral artery widely patent to the basilar. Left vertebral artery is small and ends in PICA without significant contribution to the basilar. Skeleton: Cervical degenerative change.  No fracture or bony lesion. Other neck: Right thyroid lesion measuring 20 mm. This has irregular margins and is noncalcified. Thyroid ultrasound and possible biopsy recommended to rule out neoplasm. CTA HEAD Anterior circulation: Mild atherosclerotic calcified gauge in the cavernous carotid bilaterally without significant stenosis. Anterior and middle cerebral arteries widely patent bilaterally without stenosis. Posterior circulation:  Right vertebral artery patent to the basilar. Right PICA patent. Left vertebral artery hypoplastic ending in PICA without significant contribution to the basilar. Basilar widely patent. Superior cerebellar artery patent bilaterally. Posterior communicating artery patent bilaterally. Right posterior cerebral artery widely patent Left posterior cerebral artery occluded at the origin with possible faint reconstitution distally. Large subacute infarct left PCA territory. Venous sinuses: Patent Anatomic variants: negative for cerebral aneurysm Delayed phase: Slight enhancement along the periphery of the left PCA infarct compatible with subacute infarction. No enhancing mass lesion. IMPRESSION: Large subacute infarct left PCA territory Left posterior cerebral artery is occluded with faint reconstitution in the midportion. No significant carotid or vertebral artery stenosis in the neck. 2 cm right thyroid lesion. Recommend thyroid ultrasound and biopsy to rule out neoplasm. Electronically Signed   By: Marlan Palau M.D.   On: 02/04/2016 10:34   I have personally reviewed and evaluated these images and lab results as part of my medical decision-making.   EKG Interpretation   Date/Time:  Wednesday February 04 2016 08:44:37 EDT Ventricular Rate:  63 PR Interval:    QRS Duration: 117 QT Interval:  422 QTC Calculation: 432 R Axis:   -46 Text Interpretation:  Sinus rhythm Incomplete RBBB and LAFB Artifact  Abnormal ekg Confirmed by Gerhard Munch  MD [phone]) on 02/04/2016 9:40:11  AM     I discussed patient's case with our neurology team after my initial  evaluation. Of concern for stroke, integration of transformation from ischemic to hemorrhagic given his description of new speech deficit, patient was a code stroke.   Update: Patient does not have hemorrhagic findings on CT, though CT demonstrates. MDM   Final diagnoses:  Stroke (cerebrum) (HCC)  Stroke (cerebrum) [facility](HCC)  Patient presents with new  right-sided weakness, speech difficulty, is found to have stroke. Patient's case discussed with neurology, patient required admission for further evaluation, management.   Gerhard Munchobert Quentina Fronek, MD 02/04/16 1052

## 2016-02-04 NOTE — ED Notes (Signed)
Dr. Jeraldine LootsLockwood at bedside to assess patient.

## 2016-02-04 NOTE — ED Notes (Signed)
Pt returning to room from CT with Marsh DollyBrooke RN, Pharmacy, Dr. Amada JupiterKirkpatrick and Ronaldo MiyamotoKyle EMT at bedside.

## 2016-02-04 NOTE — Consult Note (Signed)
Neurology Consultation Reason for Consult: Stroke Referring Physician: Jeraldine Loots, R  CC: right arm weakness  History is obtained from:patient  HPI: Ronald Forbes is a 55 y.o. male who does not go to the doctor who presents with right arm weakness/slurred speech worse today, but present over the past 2 weeks. He states that his symptoms got worse today and this ws what prompted him to come to the emergency room.    LKW: about 2 weeks ago tpa given?: no, out of window.     ROS: A 14 point ROS was performed and is negative except as noted in the HPI.   PMH: Does not see doctors  FHx: Mother - stroke  Social History:  reports that he has been smoking Cigarettes.  He has been smoking about 1.00 pack per day. He does not have any smokeless tobacco history on file. He reports that he drinks alcohol. He reports that he uses illicit drugs (Marijuana). Drinks 1 -2 40 oz beers per day  Exam: Current vital signs: BP 150/88 mmHg  Pulse 64  Temp(Src) 97.7 F (36.5 C) (Oral)  Resp 13  Ht  (1.905 m)  Wt 127.007 kg (280 lb)  BMI 35.00 kg/m2  SpO2 94% Vital signs in last 24 hours: Temp:  [97.7 F (36.5 C)] 97.7 F (36.5 C) (06/21 0849) Pulse Rate:  [64] 64 (06/21 0849) Resp:  [13] 13 (06/21 0849) BP: (150)/(88) 150/88 mmHg (06/21 0849) SpO2:  [94 %] 94 % (06/21 0849) Weight:  [127.007 kg (280 lb)] 127.007 kg (280 lb) (06/21 0849)   Physical Exam  Constitutional: Appears well-developed and well-nourished.  Psych: Affect appropriate to situation Eyes: No scleral injection HENT: No OP obstrucion Head: Normocephalic.  Cardiovascular: Normal rate and regular rhythm.  Respiratory: Effort normal and breath sounds normal to anterior ascultation GI: Soft.  No distension. There is no tenderness.  Skin: WDI  Neuro: Mental Status: Patient is awake, alert, oriented to person, place, month, year, and situation. Patient is able to give a clear and coherent history. No signs of  aphasia or neglect Cranial Nerves: II: R hemianopia. Pupils are equal, round, and reactive to light.   III,IV, VI: EOMI without ptosis or diploplia.  V: Facial sensation is decreased on the right VII: Facial movement is symmetric.  VIII: hearing is intact to voice X: Uvula elevates symmetrically XI: Shoulder shrug is symmetric. XII: tongue is midline without atrophy or fasciculations.  Motor: Tone is normal. Bulk is normal. 5/5 strength was present on the left, on the right he has 3/5 proximally and 4-/5 distally. In his leg he has 4-/5 strength throughout.  Sensory: Diminished throughout the right side.  Cerebellar: FNF intact on the left   I have reviewed labs in epic and the results pertinent to this consultation are: cmp - milldy elevated bilirubin/AST, otherwise unremarkable  I have reviewed the images obtained:CT head - left occipital infarct  Impression: 55 yo M with left occipital infarct, possibilities include large vessel athero(PCA) vs embolism. Will need further workup and PT. Concern for extension this AM.    Recommendations: 1. HgbA1c, fasting lipid panel 2. MRI brain without contrast, no MRA needed 3. Frequent neuro checks 4. Echocardiogram 5. CT angio head and neck 6. Prophylactic therapy-Antiplatelet med: Aspirin - dose  PO or  PR 7. Risk factor modification 8. Telemetry monitoring 9. PT consult, OT consult, Speech consult 10. please page stroke NP  Or  PA  Or MD  M-F from 8am -4 pm starting  6/22 as this patient will be followed by the stroke team at this point.   You can look them up on [email or web address]      Ritta SlotMcNeill Naryah Clenney, MD [facility] [phone]  If 7pm- 7am, please page neurology on call as listed in AMION.

## 2016-02-05 ENCOUNTER — Inpatient Hospital Stay (HOSPITAL_COMMUNITY): Payer: Medicaid Other

## 2016-02-05 DIAGNOSIS — Z720 Tobacco use: Secondary | ICD-10-CM

## 2016-02-05 DIAGNOSIS — E785 Hyperlipidemia, unspecified: Secondary | ICD-10-CM

## 2016-02-05 DIAGNOSIS — I639 Cerebral infarction, unspecified: Secondary | ICD-10-CM

## 2016-02-05 DIAGNOSIS — F1099 Alcohol use, unspecified with unspecified alcohol-induced disorder: Secondary | ICD-10-CM

## 2016-02-05 DIAGNOSIS — I6789 Other cerebrovascular disease: Secondary | ICD-10-CM

## 2016-02-05 LAB — ECHOCARDIOGRAM COMPLETE
CHL CUP MV DEC (S): 352
E decel time: 352 msec
FS: 28 % (ref 28–44)
Height: 75 in
IV/PV OW: 1.38
LA diam end sys: 32 mm
LA diam index: 1.29 cm/m2
LA vol A4C: 55.9 ml
LA vol: 65.1 mL
LASIZE: 32 mm
LAVOLIN: 26.1 mL/m2
LDCA: 3.46 cm2
LV TDI E'MEDIAL: 6.09
LV e' LATERAL: 9.14 cm/s
LVOTD: 21 mm
MVPKEVEL: 0.6 m/s
PW: 8 mm — AB (ref 0.6–1.1)
Reg peak vel: 224 cm/s
TAPSE: 23.3 mm
TDI e' lateral: 9.14
TRMAXVEL: 224 cm/s
Weight: 4310.43 oz

## 2016-02-05 LAB — GLUCOSE, CAPILLARY
GLUCOSE-CAPILLARY: 218 mg/dL — AB (ref 65–99)
Glucose-Capillary: 133 mg/dL — ABNORMAL HIGH (ref 65–99)
Glucose-Capillary: 177 mg/dL — ABNORMAL HIGH (ref 65–99)

## 2016-02-05 LAB — COMPREHENSIVE METABOLIC PANEL
ALBUMIN: 3.9 g/dL (ref 3.5–5.0)
ALT: 29 U/L (ref 17–63)
ANION GAP: 9 (ref 5–15)
AST: 23 U/L (ref 15–41)
Alkaline Phosphatase: 53 U/L (ref 38–126)
BILIRUBIN TOTAL: 0.9 mg/dL (ref 0.3–1.2)
BUN: 15 mg/dL (ref 6–20)
CHLORIDE: 103 mmol/L (ref 101–111)
CO2: 23 mmol/L (ref 22–32)
Calcium: 9.2 mg/dL (ref 8.9–10.3)
Creatinine, Ser: 0.91 mg/dL (ref 0.61–1.24)
GFR calc Af Amer: >60 mL/min (ref >60)
GLUCOSE: 131 mg/dL — AB (ref 65–99)
POTASSIUM: 3.6 mmol/L (ref 3.5–5.1)
Sodium: 135 mmol/L (ref 135–145)
TOTAL PROTEIN: 7.1 g/dL (ref 6.5–8.1)

## 2016-02-05 LAB — LIPID PANEL
Cholesterol: 221 mg/dL — ABNORMAL HIGH (ref 0–200)
HDL: 36 mg/dL — ABNORMAL LOW (ref >40)
LDL CALC: 112 mg/dL — AB (ref 0–99)
TRIGLYCERIDES: 364 mg/dL — AB (ref <150)
Total CHOL/HDL Ratio: 6.1 RATIO
VLDL: 73 mg/dL — AB (ref 0–40)

## 2016-02-05 LAB — SEDIMENTATION RATE: SED RATE: 3 mm/h (ref 0–16)

## 2016-02-05 LAB — CBC
HEMATOCRIT: 46.7 % (ref 39.0–52.0)
HEMOGLOBIN: 15.9 g/dL (ref 13.0–17.0)
MCH: 31.9 pg (ref 26.0–34.0)
MCHC: 34 g/dL (ref 30.0–36.0)
MCV: 93.8 fL (ref 78.0–100.0)
Platelets: 151 10*3/uL (ref 150–400)
RBC: 4.98 MIL/uL (ref 4.22–5.81)
RDW: 13.3 % (ref 11.5–15.5)
WBC: 10.1 10*3/uL (ref 4.0–10.5)

## 2016-02-05 LAB — C-REACTIVE PROTEIN: CRP: 1.2 mg/dL — ABNORMAL HIGH (ref <1.0)

## 2016-02-05 LAB — TSH: TSH: 0.318 u[IU]/mL — ABNORMAL LOW (ref 0.350–4.500)

## 2016-02-05 LAB — RPR: RPR: NONREACTIVE

## 2016-02-05 LAB — VITAMIN B12: VITAMIN B 12: 830 pg/mL (ref 180–914)

## 2016-02-05 LAB — HIV ANTIBODY (ROUTINE TESTING W REFLEX): HIV SCREEN 4TH GENERATION: NONREACTIVE

## 2016-02-05 MED ORDER — ATORVASTATIN CALCIUM 40 MG PO TABS
40.0000 mg | ORAL_TABLET | Freq: Every day | ORAL | Status: DC
  Administered 2016-02-05 – 2016-02-08 (×4): 40 mg via ORAL
  Filled 2016-02-05 (×5): qty 1

## 2016-02-05 NOTE — Progress Notes (Signed)
  Echocardiogram 2D Echocardiogram has been performed.  Arvil ChacoFoster, Iniko Robles 02/05/2016, 3:31 PM

## 2016-02-05 NOTE — Progress Notes (Signed)
   Subjective: Patient feels some improvement this morning however still has significant weakness at his RUE and RLE with intermittent fluent aphasia. Sister-in-law and nephew are present and updated on management and plan going forward. Family states that patient has "peaks and valleys" of his symptoms but no improvement back to his baseline during peaks. Patient reports right sided visual field deficit today.  Objective: Vital signs in last 24 hours: Filed Vitals:   02/05/16 0405 02/05/16 0634 02/05/16 1028 02/05/16 1409  BP: 132/84 143/89 149/88 115/67  Pulse: 59 61 64 65  Temp:   98.8 F (37.1 C) 99.9 F (37.7 C)  TempSrc:   Oral Oral  Resp: 18 22 19 17   Height:      Weight:      SpO2: 92% 94% 96% 95%   Weight change:  No intake or output data in the 24 hours ending 02/05/16 1757   General: resting in bed, no acute distress HEENT: PRRL left more constricted compared to right, right lateral visual field deficit Cardiac: RRR, no rubs, murmurs or gallops Pulm: clear to auscultation bilaterally, moving normal volumes of air Abd: soft, nontender, nondistended, BS present Ext: warm and well perfused, no pedal edema Neuro: alert and oriented to person, knows birthdate, cannot say year, fluent aphasia, RUE 1/5, RLE 3/5, LUE and LLE 5/5   Assessment/Plan: Principal Problem:   CVA (cerebral vascular accident) (HCC) Active Problems:   Tobacco abuse   Alcohol use disorder (HCC)   Morbid obesity (HCC)  Left PCA territory and midbrain infarct: CTA Head and Neck showed a large subacute infarct of the left PCA territory. The left posterior cerebral artery is occluded, suggesting an atherosclerotic cause. MRI shows a large acute/subacute PCA territory infarct and acute left midbrain infarct. TTE with EF 55-60%, no PFO. Neurology has arranged for TEE on 6/23. Patient with continued right sided weakness, fluent aphasia, and right lateral visual field deficit. -Neurology following, appreciate  assistance -ASA 325 mg -Atorvastatin 40 mg daily -PT/OT/SLP eval recommending CIR. -Hgb A1C pending -Lipid panel: Cholesterol 221, Triglycerides 364, HDL 36, LDL 112 -Admit to telemetry with neuro checks -Risk factor modification -Permissive HTN <220/120  Elevated total protein: On admission, AST/ALT is 47/25 with mild increased bilirubin of 1.7 and total protein elevated at >12.0. UA does show proteinuria at 100 mg/dL. Repeat CMP has normalized. -HCV antibody pending -HIV antibody negative  Thyroid nodule: 2 cm right thyroid lesion was seen on CTA. TSH slightly low at 0.318.  -Check free T4, T3 -Consider thyroid U/S and possible biopsy outpatient  EtOH use: Reports drinking a 6 pack nightly, denies history of withdrawal or seizures. -CIWA protocol -Thiamine, Folic Acid, MVM  Tobacco use: Smoked 1 PPD for ~40 years.  -Smoking cessation counseling -Nicotine patch  Dispo: Disposition is deferred at this time, awaiting improvement of current medical problems.  Anticipated discharge in approximately 2-3 day(s).   The patient does not have a current PCP (No primary care provider on file.) and [facility].  The patient does have transportation limitations that hinder transportation to clinic appointments.    LOS: 1 day   Darreld McleanVishal Shataya Winkles, MD 02/05/2016, 5:57 PM

## 2016-02-05 NOTE — Progress Notes (Signed)
VASCULAR LAB PRELIMINARY  PRELIMINARY  PRELIMINARY  PRELIMINARY  Bilateral lower extremity venous duplex has been completed.     Bilateral:  No evidence of DVT, superficial thrombosis, or Baker's Cyst.   Jenetta Logesami Brittish Bolinger, RVT, RDMS 02/05/2016, 12:40 PM

## 2016-02-05 NOTE — Evaluation (Addendum)
Speech Language Pathology Evaluation Patient Details Name: Ronald Forbes MRN: [id number] DOB: 12-05-1960 Today's Date: 02/05/2016 Time: 1030-1050 SLP Time Calculation (min) (ACUTE ONLY): 20 min  Problem List:  Patient Active Problem List   Diagnosis Date Noted  . Tobacco abuse 02/04/2016  . Alcohol use disorder (HCC) 02/04/2016  . Morbid obesity (HCC) 02/04/2016  . CVA (cerebral vascular accident) (HCC) 02/04/2016   Past Medical History: History reviewed. No pertinent past medical history. Past Surgical History: History reviewed. No pertinent past surgical history. HPI:  Pt is a [age] y.o. male with PMH of alcohol/ tobacco abuse, presented to ED on 6/21 with slurred speech and R side weakness. Pt reports slurred speech and trouble speaking intended words but understands when spoken to and recognizes when saying words not intended. Also reports decreased sensaiton on R side of face. MRI showed large subacute L PCA territory infarct affecting posterior temporal/ medial occipital lobes and much of thalamus, early hemorrhagic transformation; additional acute infarct of L midbrain- cerebral peduncle. Bedside swallow eval ordered- pt passed RN stroke swallow screen but RN reported concerns with alertness levels.    Assessment / Plan / Recommendation Clinical Impression  Pt currently presenting with a fluent aphasia. Speech in conversation containing frequent semantic paraphasias and neologisms. Pt was able to name objects in environment 2 out of 6 correct- phonemic/ semantic cues not helpful in word retrieval. No impairments in sentence repetition. Able to follow 1-step directions 100% accuracy, 2-step directions 50% accuracy. Pt aware of speech errors on most occasions and frustrated- stated "my brain knows it but the right words won't come out". Cognition was difficult to fully assess given aphasia; pt was able to recall recent events and demonstrated adequate selective attention to tasks presented,  along with good awareness of deficits. Speech is slightly slurred due to R side weakness but intelligible. Pt will benefit from continued speech therapy to address receptive and expressive language skills needed for safety and functional communication in the acute care setting, and will likely benefit from continued therapy at next level of care. Will continue to follow.      SLP Assessment  Patient needs continued Speech Lanaguage Pathology Services    Follow Up Recommendations  Inpatient Rehab    Frequency and Duration min 2x/week  2 weeks      SLP Evaluation Prior Functioning  Cognitive/Linguistic Baseline: Within functional limits Type of Home: House   Cognition  Overall Cognitive Status: Difficult to assess Arousal/Alertness: Awake/alert Orientation Level: Oriented to person;Other (comment) (difficult to assess other orientation ?s due to aphasia) Attention: Selective Selective Attention: Appears intact Memory: Appears intact (with limited assessment) Awareness: Appears intact Safety/Judgment: Appears intact    Comprehension  Auditory Comprehension Overall Auditory Comprehension: Impaired Yes/No Questions: Within Functional Limits Commands: Impaired One Step Basic Commands: 75-100% accurate Two Step Basic Commands: 50-74% accurate Conversation: Simple EffectiveTechniques: Repetition;Visual/Gestural cues Reading Comprehension Reading Status: Impaired Word level: Impaired    Expression Expression Primary Mode of Expression: Verbal Verbal Expression Overall Verbal Expression: Impaired Initiation: No impairment Level of Generative/Spontaneous Verbalization: Conversation Repetition: No impairment Naming: Impairment Confrontation: Impaired Convergent: 25-49% accurate Verbal Errors: Neologisms;Perseveration;Semantic paraphasias Pragmatics: No impairment Effective Techniques:  (attempted phonemic/ semantic cues- ineffective) Non-Verbal Means of Communication: Not  applicable Written Expression Dominant Hand: Right Written Expression: Unable to assess (comment)   Oral / Motor  Oral Motor/Sensory Function Overall Oral Motor/Sensory Function: Mild impairment Facial ROM: Reduced right Facial Symmetry: Abnormal symmetry right Facial Strength: Reduced right Facial Sensation: Within Functional Limits Lingual  ROM: Within Functional Limits Lingual Symmetry: Within Functional Limits Lingual Strength: Within Functional Limits Motor Speech Overall Motor Speech: Appears within functional limits for tasks assessed Respiration: Within functional limits Phonation: Normal Resonance: Within functional limits Articulation: Impaired Level of Impairment: Conversation Intelligibility: Intelligible Motor Planning: Witnin functional limits Motor Speech Errors: Not applicable   GO                    Ronald Forbes, Amy K, MA, CCC-SLP 02/05/2016, 11:04 AM [phone]

## 2016-02-05 NOTE — Evaluation (Signed)
Clinical/Bedside Swallow Evaluation Patient Details  Name: Ronald Forbes MRN: [id number] Date of Birth: 14-Apr-1961  Today's Date: 02/05/2016 Time: SLP Start Time (ACUTE ONLY): 1030 SLP Stop Time (ACUTE ONLY): 1050 SLP Time Calculation (min) (ACUTE ONLY): 20 min  Past Medical History: History reviewed. No pertinent past medical history. Past Surgical History: History reviewed. No pertinent past surgical history. HPI:  Pt is a 55 y.o. male with PMH of alcohol/ tobacco abuse, presented to ED on 6/21 with slurred speech and R side weakness. Pt reports slurred speech and trouble speaking intended words but understands when spoken to and recognizes when saying words not intended. Also reports decreased sensaiton on R side of face. MRI showed large subacute L PCA territory infarct affecting posterior temporal/ medial occipital lobes and much of thalamus, early hemorrhagic transformation; additional acute infarct of L midbrain- cerebral peduncle. Bedside swallow eval ordered- pt passed RN stroke swallow screen but RN reported concerns with alertness levels.    Assessment / Plan / Recommendation Clinical Impression  Pt showed no overt s/s of aspiration across consistencies. No pocketing or oral residuals noted despite R side weakness. Aspiration risk appears mild. Recommend continuing regulr diet, thin liquids, meds whole with liquid. Reviewed general aspiration precautions with pt- chew foods thoroughly, small bites/ sips, sit upright. Pt may need assistance with meal set-up for feeding on left side. SLP will sign off for swallowing order but will continue to follow for speech/ language.     Aspiration Risk  Mild aspiration risk    Diet Recommendation Regular;Thin liquid   Liquid Administration via: Cup;Straw Medication Administration: Whole meds with liquid Supervision: Patient able to self feed;Comment;Intermittent supervision to cue for compensatory strategies (staff assist with set-up for feeding  on L side) Compensations: Slow rate;Small sips/bites Postural Changes: Seated upright at 90 degrees    Other  Recommendations Oral Care Recommendations: Oral care BID   Follow up Recommendations  Other (comment) (none for swallowing; will cont to follow for speech/ languag)    Frequency and Duration            Prognosis        Swallow Study   General HPI: Pt is a 55 y.o. male with PMH of alcohol/ tobacco abuse, presented to ED on 6/21 with slurred speech and R side weakness. Pt reports slurred speech and trouble speaking intended words but understands when spoken to and recognizes when saying words not intended. Also reports decreased sensaiton on R side of face. MRI showed large subacute L PCA territory infarct affecting posterior temporal/ medial occipital lobes and much of thalamus, early hemorrhagic transformation; additional acute infarct of L midbrain- cerebral peduncle. Bedside swallow eval ordered- pt passed RN stroke swallow screen but RN reported concerns with alertness levels.  Type of Study: Bedside Swallow Evaluation Diet Prior to this Study: Regular;Thin liquids Temperature Spikes Noted: No Respiratory Status: Room air History of Recent Intubation: No Behavior/Cognition: Alert;Cooperative Oral Cavity Assessment: Within Functional Limits Oral Care Completed by SLP: No Oral Cavity - Dentition: Adequate natural dentition Vision: Functional for self-feeding Self-Feeding Abilities: Able to feed self (some assist due to R side weakness) Patient Positioning: Upright in bed Baseline Vocal Quality: Normal Volitional Cough: Strong Volitional Swallow: Able to elicit    Oral/Motor/Sensory Function Overall Oral Motor/Sensory Function: Mild impairment Facial ROM: Reduced right Facial Symmetry: Abnormal symmetry right Facial Strength: Reduced right Facial Sensation: Within Functional Limits Lingual ROM: Within Functional Limits Lingual Symmetry: Within Functional  Limits Lingual Strength: Within Functional Limits  Ice Chips Ice chips: Not tested   Thin Liquid Thin Liquid: Within functional limits Presentation: Cup;Straw    Nectar Thick Nectar Thick Liquid: Not tested   Honey Thick Honey Thick Liquid: Not tested   Puree Puree: Within functional limits Presentation: Self Fed;Spoon   Solid   GO   Solid: Within functional limits Presentation: Self Fed       Ronald Forbes Ronald Forbes K, MA, CCC-SLP 02/05/2016,10:52 AM [phone]

## 2016-02-05 NOTE — Evaluation (Signed)
Physical Therapy Evaluation Patient Details Name: Ronald Forbes MRN: [id number] DOB: Jan 11, 1961 Today's Date: 02/05/2016   History of Present Illness  pt is a 55 y/o male with h/o smoking, marijuana use, admitted to ED with slurred speech/ expressive aphasia and Right sided weakness. 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  Clinical Impression  Pt admitted with/for R sided weakness, MRI showing large L parieto-occipal infarct.  Pt currently limited functionally due to the problems listed. ( See problems list.)   Pt will benefit from PT to maximize function and safety in order to get ready for next venue listed below.     Follow Up Recommendations CIR    Equipment Recommendations  Other (comment) (TBA next venue)    Recommendations for Other Services Rehab consult     Precautions / Restrictions Precautions Precautions: Fall      Mobility  Bed Mobility Overal bed mobility: Needs Assistance;+2 for physical assistance Bed Mobility: Rolling;Sidelying to Sit;Sit to Supine Rolling: Min assist Sidelying to sit: Min assist;+2 for safety/equipment   Sit to supine: Mod assist;+2 for safety/equipment   General bed mobility comments: cues for technique, stability assist  Transfers Overall transfer level: Needs assistance   Transfers: Sit to/from Stand Sit to Stand: Mod assist;+2 physical assistance         General transfer comment: assist to come forward, stay symmetrical, stabilize R UE and R knee.  Ambulation/Gait             General Gait Details: NT  Stairs            Wheelchair Mobility    Modified Rankin (Stroke Patients Only) Modified Rankin (Stroke Patients Only) Pre-Morbid Rankin Score: No symptoms Modified Rankin: Severe disability     Balance Overall balance assessment: Needs assistance Sitting-balance support: No upper extremity supported Sitting balance-Leahy Scale: Fair Sitting balance -  Comments: Uncoordinated, but could maintain sitting EOB without assist,    Standing balance support: Single extremity supported;Bilateral upper extremity supported Standing balance-Leahy Scale: Poor Standing balance comment: stood at EOB with 2 person assist for 1-2 min working on w/shifting to left, stability of R side, weightbearing through R UE/hand                             Pertinent Vitals/Pain Pain Assessment: No/denies pain    Home Living Family/patient expects to be discharged to:: Private residence Living Arrangements: Alone Available Help at Discharge: Family;Available PRN/intermittently Type of Home: Apartment Home Access: Level entry     Home Layout: One level Home Equipment: None      Prior Function Level of Independence: Independent         Comments: 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[employer], educationalmaintenance     Hand Dominance   Dominant Hand: Right    Extremity/Trunk Assessment   Upper Extremity Assessment: Defer to OT evaluation           Lower Extremity Assessment: RLE deficits/detail RLE Deficits / Details: brunstrom 1       Communication   Communication: Expressive difficulties  Cognition Arousal/Alertness: Awake/alert Behavior During Therapy: WFL for tasks assessed/performed Overall Cognitive Status: Impaired/Different from baseline Area of Impairment: Memory     Memory: Decreased short-term memory              General Comments      Exercises        Assessment/Plan    PT Assessment Patient needs continued  PT services  PT Diagnosis Hemiplegia dominant side   PT Problem List Decreased strength;Decreased activity tolerance;Decreased balance;Decreased mobility;Decreased coordination;Decreased cognition;Decreased knowledge of use of DME;Decreased safety awareness;Decreased knowledge of precautions;Impaired sensation  PT Treatment Interventions DME instruction;Gait training;Functional mobility training;Therapeutic activities;Therapeutic  exercise;Neuromuscular re-education;Balance training;Patient/family education   PT Goals (Current goals can be found in the Care Plan section) Acute Rehab PT Goals Patient Stated Goal: get better, independent PT Goal Formulation: With patient Time For Goal Achievement: 02/19/16 Potential to Achieve Goals: Good    Frequency Min 3X/week   Barriers to discharge Decreased caregiver support      Co-evaluation               End of Session   Activity Tolerance: Patient tolerated treatment well Patient left: in bed;with call bell/phone within reach Nurse Communication: Mobility status         Time: [id number] PT Time Calculation (min) (ACUTE ONLY): 32 min   Charges:   PT Evaluation $PT Eval Moderate Complexity: 1 Procedure     PT G Codes:        [name], [name] 02/05/2016, 4:56 PM 02/05/2016  [name], PT [phone]  (pager)

## 2016-02-05 NOTE — Progress Notes (Signed)
    [facility] has been requested to perform a transesophageal echocardiogram on 02/06/2016 with Dr. Eden EmmsNishan for CVA.  After careful review of history and examination, the risks and benefits of transesophageal echocardiogram have been explained including risks of esophageal damage, perforation (1:10,000 risk), bleeding, pharyngeal hematoma as well as other potential complications associated with conscious sedation including aspiration, arrhythmia, respiratory failure and death. Alternatives to treatment were discussed, questions were answered. Patient is willing to proceed.   I have reviewed lab work, Hgb 15.9, Platelets 151,000. His blood pressure has been stable without the need for pressers.   Laverda PageLindsay Jemar Paulsen, NP-C 02/05/2016 4:00 PM

## 2016-02-05 NOTE — Progress Notes (Signed)
STROKE TEAM PROGRESS NOTE   HISTORY OF PRESENT ILLNESS (per record) Ronald Forbes is a 55 y.o. male who does not go to the doctor who presents with right arm weakness/slurred speech worse today, but present over the past 2 weeks. He states that his symptoms got worse today and this was what prompted him to come to the emergency room. He was LKW about 2 weeks ago. Patient was not administered IV t-PA secondary to delay in arrival. He was admitted for further evaluation and treatment.   SUBJECTIVE (INTERVAL HISTORY) No family is at the bedside.  He is awake, lying in the bed. Overall he feels his condition is stable. He is frustrated with his language. He still has right hemianopia, right  Hemisensory loss and hemiplegia. He needs TEE and loop.   OBJECTIVE Temp:  [97.8 F (36.6 C)-98.8 F (37.1 C)] 98.8 F (37.1 C) (06/22 1028) Pulse Rate:  [52-67] 64 (06/22 1028) Cardiac Rhythm:  [-] Normal sinus rhythm (06/22 0700) Resp:  [14-27] 19 (06/22 1028) BP: (119-156)/(69-96) 149/88 mmHg (06/22 1028) SpO2:  [88 %-97 %] 96 % (06/22 1028)  CBC:  Recent Labs Lab 02/04/16 0849  02/04/16 1601 02/05/16 0001  WBC 9.9  --  9.6 10.1  NEUTROABS 7.2  --   --   --   HGB 16.7  < > 15.8 15.9  HCT 47.4  < > 45.9 46.7  MCV 91.7  --  92.0 93.8  PLT 148*  --  144* 151  < > = values in this interval not displayed.  Basic Metabolic Panel:   Recent Labs Lab 02/04/16 0849 02/04/16 0859 02/04/16 1601 02/05/16 0001  NA 136 138  --  135  K 4.9 4.3  --  3.6  CL 104 104  --  103  CO2 23  --   --  23  GLUCOSE 139* 141*  --  131*  BUN 15 21*  --  15  CREATININE 0.82 0.80 0.88 0.91  CALCIUM 9.2  --   --  9.2    Lipid Panel:     Component Value Date/Time   CHOL 221* 02/05/2016 0001   TRIG 364* 02/05/2016 0001   HDL 36* 02/05/2016 0001   CHOLHDL 6.1 02/05/2016 0001   VLDL 73* 02/05/2016 0001   LDLCALC 112* 02/05/2016 0001   HgbA1c: No results found for: HGBA1C Urine Drug Screen:      Component Value Date/Time   LABOPIA NONE DETECTED 02/04/2016 1025   COCAINSCRNUR NONE DETECTED 02/04/2016 1025   LABBENZ NONE DETECTED 02/04/2016 1025   AMPHETMU NONE DETECTED 02/04/2016 1025   THCU POSITIVE* 02/04/2016 1025   LABBARB NONE DETECTED 02/04/2016 1025      IMAGING I have personally reviewed the radiological images below and agree with the radiology interpretations.  Ct Head Wo Contrast 02/04/2016   1. Stable large subacute left PCA distribution infarct from examination earlier today. 2. No new findings.   Ct Head Wo Contrast 02/04/2016  Large area of cortical infarction involving the posterior LEFT parietal and LEFT occipital lobes extending into posterior LEFT temporal lobe, probably subacute in age. Old appearing lacunar infarct anterior LEFT basal ganglia into the anterior limb of LEFT internal capsule. No midline shift or intracranial hemorrhage identified.   Ct Angio Head & Neck W Or Wo Contrast 02/04/2016  Large subacute infarct left PCA territory Left posterior cerebral artery is occluded with faint reconstitution in the midportion. No significant carotid or vertebral artery stenosis in the neck. 2 cm right  thyroid lesion. Recommend thyroid ultrasound and biopsy to rule out neoplasm.   Mr Brain Wo Contrast 02/04/2016  Large acute/subacute LEFT PCA territory infarct. Early hemorrhagic transformation without lobar hematoma. Additional acute infarction of the LEFT midbrain likely accounts for the reported RIGHT hemiparesis. Chronic microvascular ischemic change with areas of chronic lacunar hemorrhagic insult as described.   LE Venous Doppler Bilateral: No evidence of DVT, superficial thrombosis, or Baker's Cyst.  TTE - - Left ventricle: The cavity size was normal. Wall thickness was  increased in a pattern of mild LVH. Systolic function was normal.  The estimated ejection fraction was in the range of 55% to 60%.  Wall motion was normal; there were no regional wall  motion  abnormalities. Doppler parameters are consistent with abnormal  left ventricular relaxation (grade 1 diastolic dysfunction). - Atrial septum: No defect or patent foramen ovale was identified.  PHYSICAL EXAM Physical exam  Temp:  [98.8 F (37.1 C)-99.9 F (37.7 C)] 98.9 F (37.2 C) (06/22 2136) Pulse Rate:  [59-65] 65 (06/22 2136) Resp:  [16-22] 16 (06/22 2136) BP: (115-149)/(61-90) 142/90 mmHg (06/22 2136) SpO2:  [90 %-97 %] 97 % (06/22 2136)  General - Well nourished, well developed, in no apparent distress.  Ophthalmologic - Fundi not visualized due to noncooperation.  Cardiovascular - Regular rate and rhythm.  Mental Status -  Awake, alert, orientated to place, but not orientated to time or people. Language including expression, repetition, comprehension was assessed and found intact, however, has anomia. Fund of Knowledge was assessed and was impaired.  Cranial Nerves II - XII - II - right homonymous hemianopia. III, IV, VI - Extraocular movements intact. V - right facial decreased sensation, 50% of left. VII - right facial droop. VIII - Hearing & vestibular intact bilaterally. X - Palate elevates symmetrically. XI - Chin turning & shoulder shrug intact bilaterally. XII - Tongue protrusion intact.  Motor Strength - The patient's strength was normal in LUE and LLE, and 0/5 RUE and 2/5 RLE.  Bulk was normal and fasciculations were absent.   Motor Tone - Muscle tone was assessed at the neck and appendages and was normal.  Reflexes - The patient's reflexes were 1+ in all extremities and he had no pathological reflexes.  Sensory - Light touch, temperature/pinprick were assessed and were decreased on the right, 20% of left.    Coordination - The patient had normal movements in the left hand with no ataxia or dysmetria.  Tremor was absent.  Gait and Station - not tested due to weakness.   ASSESSMENT/PLAN [name] is a 55 y.o. male with history of  alcohol and cigarette use presenting with right sided weakness. He did not receive IV t-PA due to delay in arrival.   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 2cm R thyroid lesion.  2D Echo  EF 55-60%   LE doppler negative for DVT TEE to look for embolic source. Arranged with [facility] for tomorrow.  If positive for PFO (patent foramen ovale), check bilateral lower extremity venous dopplers to rule out DVT as possible source of stroke. (I have made patient NPO after midnight tonight). If TEE negative, a  [facility] electrophysiologist will consult and consider placement of an implantable loop recorder to evaluate for atrial  fibrillation as etiology of stroke. This has been explained to patient/family by Dr. Roda ShuttersXu and they are agreeable.   LDL 112  HgbA1c pending  Hypercoagulable work up pending  Lovenox 60 mg sq daily for VTE prophylaxis Diet heart healthy/carb modified Room service appropriate?: Yes; Fluid consistency:: Thin  No antithrombotic prior to admission, now on aspirin 325 mg daily.   Patient counseled to be compliant with his antithrombotic medications  Ongoing aggressive stroke risk factor management  Therapy recommendations:  pending   Disposition:  small bowel obstruction   Hypertension  Stable  Permissive hypertension (OK if < 220/120) but gradually normalize in 5-7 days  Long-term BP goal normotensive  Hyperlipidemia  Home meds:  No statin  LDL 112, goal < 70  Now on lipitor 40 mg daily  Continue statin at discharge  Tobacco abuse  Current smoker  Smoking cessation counseling provided  Nicotine patch provided  Pt is willing to quit  Other Stroke Risk Factors  ETOH use, advised to drink no  more than 2 drink(s) a day  Marijuana use, UDS positive for THC on admission  Obesity, Body mass index is 33.67 kg/(m^2)., recommend weight loss, diet and exercise as appropriate   Other Active Problems  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 After hours, contact General Neurology

## 2016-02-05 NOTE — Evaluation (Signed)
Occupational Therapy Evaluation Patient Details Name: Ronald Forbes MRN: [id number] DOB: Feb 08, 1961 Today's Date: 02/05/2016    History of Present Illness pt is a 55 y/o male with h/o smoking, marijuana use, admitted to ED with slurred speech/ expressive aphasia and Right sided weakness. 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   Clinical Impression   Pt reports he was independent with ADLs and mobility PTA. Currently pt requiring mod assist +2 for sit to stand from EOB and mod-max assist for ADLs. Pt presenting with R sided visual deficits, impaired short term memory, and RUE strength/coordination/sensation deficits impacting his independence and safety with ADLs and functional mobility. Pt very agreeable to participate in therapy and eager to regain functional independence. Recommending CIR level therapies to maximize pts independence and safety with ADLs and functional mobility prior to return home. Pt would benefit from continued skilled OT to address established goals.    Follow Up Recommendations  CIR;Supervision/Assistance - 24 hour    Equipment Recommendations  Other (comment) (TBD at next venue)    Recommendations for Other Services Rehab consult     Precautions / Restrictions Precautions Precautions: Fall Restrictions Weight Bearing Restrictions: No      Mobility Bed Mobility Overal bed mobility: Needs Assistance;+2 for physical assistance Bed Mobility: Rolling;Sidelying to Sit;Sit to Supine Rolling: Min assist Sidelying to sit: Min assist;+2 for safety/equipment   Sit to supine: Mod assist;+2 for safety/equipment   General bed mobility comments: cues for technique, stability assist  Transfers Overall transfer level: Needs assistance   Transfers: Sit to/from Stand Sit to Stand: Mod assist;+2 physical assistance         General transfer comment: assist to come forward, stay symmetrical, stabilize R UE and  R knee.    Balance Overall balance assessment: Needs assistance Sitting-balance support: No upper extremity supported Sitting balance-Leahy Scale: Fair Sitting balance - Comments: Uncoordinated, but could maintain sitting EOB without assist,    Standing balance support: Single extremity supported;Bilateral upper extremity supported Standing balance-Leahy Scale: Poor Standing balance comment: stood at EOB with 2 person assist for 1-2 min working on w/shifting to left, stability of R side, weightbearing through R UE/hand                            ADL Overall ADL's : Needs assistance/impaired Eating/Feeding: Moderate assistance;Bed level   Grooming: Minimal assistance;Sitting   Upper Body Bathing: Moderate assistance;Sitting   Lower Body Bathing: Maximal assistance;+2 for physical assistance;Sit to/from stand   Upper Body Dressing : Moderate assistance;Sitting   Lower Body Dressing: Maximal assistance;+2 for physical assistance;Sit to/from stand               Functional mobility during ADLs: Moderate assistance;+2 for physical assistance (for sit to stand only) General ADL Comments: Educated pt on visual compensatory strategies, proper positioning of RUE, providing sensory input to RUE via L hand, attempting to use RUE functionally.     Vision Vision Assessment?: Yes;Vision impaired- to be further tested in functional context Eye Alignment: Impaired (comment) (L eye droop with slight disconjugate gaze) Tracking/Visual Pursuits: Decreased smoothness of horizontal tracking;Decreased smoothness of vertical tracking;Requires cues, head turns, or add eye shifts to track;Impaired - to be further tested in functional context Visual Fields: Right visual field deficit (?homonymous hemianopsia) Additional Comments: Pt reports no visual changes but with formal testing pt unable to see past midline on R side.   Perception  Praxis      Pertinent Vitals/Pain Pain  Assessment: No/denies pain     Hand Dominance Right   Extremity/Trunk Assessment Upper Extremity Assessment Upper Extremity Assessment: RUE deficits/detail RUE Deficits / Details: Overall full PROM. 1 to 2-/5 strength overall. RUE Sensation: decreased light touch RUE Coordination: decreased fine motor;decreased gross motor   Lower Extremity Assessment Lower Extremity Assessment: Defer to PT evaluation    Cervical / Trunk Assessment Cervical / Trunk Assessment: Normal   Communication Communication Communication: Expressive difficulties   Cognition Arousal/Alertness: Awake/alert Behavior During Therapy: WFL for tasks assessed/performed Overall Cognitive Status: Impaired/Different from baseline Area of Impairment: Memory     Memory: Decreased short-term memory             General Comments       Exercises       Shoulder Instructions      Home Living Family/patient expects to be discharged to:: Private residence Living Arrangements: Alone Available Help at Discharge: Family;Available PRN/intermittently Type of Home: Apartment Home Access: Level entry     Home Layout: One level     Bathroom Shower/Tub: Chief Strategy OfficerTub/shower unit   Bathroom Toilet: Standard     Home Equipment: None          Prior Functioning/Environment Level of Independence: Independent        Comments: 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maintenance    OT Diagnosis: Generalized weakness;Cognitive deficits;Acute pain;Disturbance of vision;Hemiplegia dominant side   OT Problem List: Decreased strength;Decreased range of motion;Impaired balance (sitting and/or standing);Decreased activity tolerance;Impaired vision/perception;Decreased coordination;Decreased cognition;Decreased safety awareness;Decreased knowledge of use of DME or AE;Decreased knowledge of precautions;Impaired sensation;Impaired tone;Obesity;Impaired UE functional use   OT Treatment/Interventions: Self-care/ADL training;Therapeutic  exercise;Neuromuscular education;Energy conservation;DME and/or AE instruction;Therapeutic activities;Cognitive remediation/compensation;Visual/perceptual remediation/compensation;Patient/family education;Balance training    OT Goals(Current goals can be found in the care plan section) Acute Rehab OT Goals Patient Stated Goal: get better, independent OT Goal Formulation: With patient Time For Goal Achievement: 02/19/16 Potential to Achieve Goals: Good ADL Goals Pt Will Perform Grooming: with set-up;with supervision;sitting Pt Will Perform Upper Body Bathing: with min assist;sitting Pt Will Perform Lower Body Bathing: with min assist;sit to/from stand Pt Will Transfer to Toilet: with min assist;stand pivot transfer;bedside commode Pt Will Perform Toileting - Clothing Manipulation and hygiene: with min assist;sit to/from stand Additional ADL Goal #1: Pt will independently verbalize correct positioning of RUE in bed/chair. Additional ADL Goal #2: Pt will correctly identify objects on R side 75% of the time with min verbal cues.  OT Frequency: Min 3X/week   Barriers to D/C:            Co-evaluation PT/OT/SLP Co-Evaluation/Treatment: Yes Reason for Co-Treatment: Complexity of the patient's impairments (multi-system involvement);For patient/therapist safety   OT goals addressed during session: Other (comment) (mobility)      End of Session Equipment Utilized During Treatment: Rolling walker  Activity Tolerance: Patient tolerated treatment well Patient left: in bed;with call bell/phone within reach;with bed alarm set   Time: [id number] OT Time Calculation (min): 32 min Charges:  OT General Charges $OT Visit: 1 Procedure OT Evaluation $OT Eval Moderate Complexity: 1 Procedure G-Codes:     Gaye AlkenBailey A Roshawn Ayala M.S., OTR/L Pager: [phone]  02/05/2016, 5:10 PM

## 2016-02-06 ENCOUNTER — Inpatient Hospital Stay (HOSPITAL_COMMUNITY): Payer: Medicaid Other

## 2016-02-06 ENCOUNTER — Encounter (HOSPITAL_COMMUNITY): Payer: Self-pay | Admitting: Nurse Practitioner

## 2016-02-06 ENCOUNTER — Encounter (HOSPITAL_COMMUNITY)
Admission: EM | Disposition: A | Discharge status: Skilled Nursing Facility | Payer: Self-pay | Source: Home / Self Care | Attending: Internal Medicine

## 2016-02-06 DIAGNOSIS — I639 Cerebral infarction, unspecified: Secondary | ICD-10-CM

## 2016-02-06 DIAGNOSIS — I63332 Cerebral infarction due to thrombosis of left posterior cerebral artery: Principal | ICD-10-CM

## 2016-02-06 DIAGNOSIS — G8191 Hemiplegia, unspecified affecting right dominant side: Secondary | ICD-10-CM

## 2016-02-06 HISTORY — PX: TEE WITHOUT CARDIOVERSION: SHX5443

## 2016-02-06 LAB — GLUCOSE, CAPILLARY
GLUCOSE-CAPILLARY: 122 mg/dL — AB (ref 65–99)
Glucose-Capillary: 147 mg/dL — ABNORMAL HIGH (ref 65–99)
Glucose-Capillary: 118 mg/dL — ABNORMAL HIGH (ref 65–99)
Glucose-Capillary: 135 mg/dL — ABNORMAL HIGH (ref 65–99)

## 2016-02-06 LAB — LUPUS ANTICOAGULANT PANEL
DRVVT: 45.8 s (ref 0.0–47.0)
PTT Lupus Anticoagulant: 38.3 s (ref 0.0–43.6)

## 2016-02-06 LAB — T4, FREE: FREE T4: 0.91 ng/dL (ref 0.61–1.12)

## 2016-02-06 LAB — HOMOCYSTEINE: HOMOCYSTEINE-NORM: 14.5 umol/L (ref 0.0–15.0)

## 2016-02-06 LAB — RHEUMATOID FACTOR: Rhuematoid fact SerPl-aCnc: <10 IU/mL (ref 0.0–13.9)

## 2016-02-06 LAB — HEPATITIS C ANTIBODY: HCV Ab: <0.1 s/co ratio (ref 0.0–0.9)

## 2016-02-06 LAB — HEMOGLOBIN A1C
Hgb A1c MFr Bld: 6.6 % — ABNORMAL HIGH (ref 4.8–5.6)
Mean Plasma Glucose: 143 mg/dL

## 2016-02-06 LAB — ANTI-DNA ANTIBODY, DOUBLE-STRANDED: ds DNA Ab: 2 IU/mL (ref 0–9)

## 2016-02-06 LAB — ANTINUCLEAR ANTIBODIES, IFA: ANA Ab, IFA: NEGATIVE

## 2016-02-06 SURGERY — ECHOCARDIOGRAM, TRANSESOPHAGEAL
Anesthesia: Moderate Sedation

## 2016-02-06 MED ORDER — KETOROLAC TROMETHAMINE 15 MG/ML IJ SOLN
15.0000 mg | Freq: Once | INTRAMUSCULAR | Status: AC
  Administered 2016-02-06: 15 mg via INTRAVENOUS
  Filled 2016-02-06: qty 1

## 2016-02-06 MED ORDER — BUTAMBEN-TETRACAINE-BENZOCAINE 2-2-14 % EX AERO
INHALATION_SPRAY | CUTANEOUS | Status: DC | PRN
  Administered 2016-02-06: 2 via TOPICAL

## 2016-02-06 MED ORDER — DIPHENHYDRAMINE HCL 50 MG/ML IJ SOLN
INTRAMUSCULAR | Status: AC
  Filled 2016-02-06: qty 1

## 2016-02-06 MED ORDER — MIDAZOLAM HCL 5 MG/ML IJ SOLN
INTRAMUSCULAR | Status: AC
Start: 2016-02-06 — End: 2016-02-06
  Filled 2016-02-06: qty 2

## 2016-02-06 MED ORDER — FENTANYL CITRATE (PF) 100 MCG/2ML IJ SOLN
INTRAMUSCULAR | Status: DC | PRN
  Administered 2016-02-06 (×2): 25 ug via INTRAVENOUS

## 2016-02-06 MED ORDER — PROCHLORPERAZINE MALEATE 10 MG PO TABS
10.0000 mg | ORAL_TABLET | Freq: Once | ORAL | Status: AC
  Administered 2016-02-06: 10 mg via ORAL
  Filled 2016-02-06: qty 1

## 2016-02-06 MED ORDER — MIDAZOLAM HCL 10 MG/2ML IJ SOLN
INTRAMUSCULAR | Status: DC | PRN
  Administered 2016-02-06 (×2): 2 mg via INTRAVENOUS
  Administered 2016-02-06: 1 mg via INTRAVENOUS
  Administered 2016-02-06: 2 mg via INTRAVENOUS

## 2016-02-06 MED ORDER — ACETAMINOPHEN 325 MG PO TABS: 650.0000 mg | ORAL_TABLET | Freq: Once | ORAL | Status: DC

## 2016-02-06 MED ORDER — DIPHENHYDRAMINE HCL 50 MG/ML IJ SOLN
INTRAMUSCULAR | Status: DC | PRN
  Administered 2016-02-06: 50 mg via INTRAVENOUS

## 2016-02-06 MED ORDER — FENTANYL CITRATE (PF) 100 MCG/2ML IJ SOLN
INTRAMUSCULAR | Status: AC
  Filled 2016-02-06: qty 2

## 2016-02-06 MED ORDER — DIPHENHYDRAMINE HCL 25 MG PO CAPS
25.0000 mg | ORAL_CAPSULE | Freq: Once | ORAL | Status: AC
  Administered 2016-02-06: 25 mg via ORAL
  Filled 2016-02-06: qty 1

## 2016-02-06 MED ORDER — SODIUM CHLORIDE 0.9 % IV SOLN
INTRAVENOUS | Status: DC
  Administered 2016-02-06: 09:00:00 via INTRAVENOUS

## 2016-02-06 NOTE — Consult Note (Signed)
Physical Medicine and Rehabilitation Consult Reason for Consult: Left PCA territory infarct Referring Physician: [facility]   HPI: Ronald Forbes is a 55 y.o. right handed male with history of alcohol and tobacco abuse. On no prescription medications. Per chart review patient lives alone. One level apartment. 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 Presented 02/04/2016 with slurred speech and right-sided weakness as well as intermittent headaches. Urine drug screen positive for marijuana. CT/MRI of the brain showed large acute/subacute left PCA territory infarct. Early hemorrhagic transformation without lobar hematoma. Additional acute infarcts of the left midbrain. Patient did not receive TPA. Echocardiogram with ejection fraction 60% grade 1 diastolic dysfunction. Bilateral lower extremity Dopplers negative. CT angiogram head and neck showed left posterior cerebral artery occluded with faint reconstitution in the midportion. No significant carotid or vertebral artery stenosis in the neck. Neurology consulted presently on aspirin for CVA prophylaxis. Subcutaneous Lovenox for DVT prophylaxis. TEE is pending await plan loop recorder. Physical and occupational therapy evaluations completed with recommendations of physical medicine rehabilitation consult.   Review of Systems  Constitutional: Negative for fever and chills.  HENT: Negative for hearing loss.   Eyes: Positive for blurred vision. Negative for double vision.  Respiratory: Positive for cough. Negative for shortness of breath.   Cardiovascular: Negative for chest pain, palpitations and leg swelling.  Gastrointestinal: Negative for nausea and vomiting.  Genitourinary: Negative for dysuria and hematuria.  Musculoskeletal: Positive for myalgias.  Skin: Negative for rash.  Neurological: Positive for weakness and headaches. Negative for seizures and loss of consciousness.  All other systems reviewed and are  negative.  History reviewed. No pertinent past medical history. History reviewed. No pertinent past surgical history. No family history on file. Social History:  reports that he has been smoking Cigarettes.  He has a 40 pack-year smoking history. He does not have any smokeless tobacco history on file. He reports that he drinks alcohol. He reports that he uses illicit drugs (Marijuana). Allergies: No Known Allergies Medications Prior to Admission  Medication Sig Dispense Refill  . ibuprofen (ADVIL,MOTRIN) 200 MG tablet Take 800 mg by mouth every 6 (six) hours as needed for mild pain.    . Multiple Vitamins-Minerals (MULTIVITAMIN WITH MINERALS) tablet Take 1 tablet by mouth daily.      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 Ambulation/Gait General Gait Details: NT    ADL: ADL Overall ADL's : Needs assistance/impaired Eating/Feeding: Moderate assistance, Bed level Grooming: Minimal assistance,  Sitting Upper Body Bathing: Moderate assistance, Sitting Lower Body Bathing: Maximal assistance, +2 for physical assistance, Sit to/from stand Upper Body Dressing : Moderate assistance, Sitting Lower Body Dressing: Maximal assistance, +2 for physical assistance,  Sit to/from stand Functional mobility during ADLs: Moderate assistance, +2 for physical assistance (for sit to stand only) General ADL Comments: Educated pt on visual compensatory strategies, proper positioning of RUE, providing sensory input to RUE via L hand, attempting to use RUE functionally.  Cognition: Cognition Overall Cognitive Status: Impaired/Different from baseline Arousal/Alertness: Awake/alert Orientation Level: Oriented to person, Disoriented to place, Disoriented to time, Oriented to situation Attention: Selective Selective Attention: Appears intact Memory: Appears intact (with limited assessment) Awareness: Appears intact Safety/Judgment: Appears intact Cognition Arousal/Alertness: Awake/alert Behavior During Therapy: WFL for tasks assessed/performed Overall Cognitive Status: Impaired/Different from baseline Area of Impairment: Memory Memory: Decreased short-term memory  Blood pressure 152/84, pulse 65, temperature 99 F (37.2 C), temperature source Oral, resp. rate 16, height 6\' 3"  (1.905 m), weight 122.2 kg (269 lb 6.4 oz), SpO2 96 %. Physical Exam  Constitutional: He is oriented to person, place, and time. He appears well-developed.  Large build  HENT:  Head: Normocephalic.  Eyes: EOM are normal.  Neck: Normal range of motion. Neck supple. No thyromegaly present.  Cardiovascular: Normal rate and regular rhythm.   Respiratory: Effort normal and breath sounds normal. No respiratory distress.  GI: Soft. Bowel sounds are normal. He exhibits no distension.  Neurological: He is alert and oriented to person, place, and time.  Speech is dysarthric but intelligible. Right central 7.  Follows simple commands.  Fair awareness of deficits. RUE: 1/5 deltoid, bicep, 0/5 trice,wrist, hand. RLE: 1+ HE,HF, KE and trace ADF/PF. Decreased LT RUE and RLE but senses pain and gross touch. Sedated from recent procedure.   Skin: Skin is warm and dry.  Psychiatric:  flat    Results for orders placed or performed during the hospital encounter of 02/04/16 (from the past 24 hour(s))  Glucose, capillary     Status: Abnormal   Collection Time: 02/05/16  8:36 AM  Result Value Ref Range   Glucose-Capillary 218 (H) 65 - 99 mg/dL   Comment 1 Notify RN    Comment 2 Document in Chart   Glucose, capillary     Status: Abnormal   Collection Time: 02/05/16 11:41 AM  Result Value Ref Range   Glucose-Capillary 133 (H) 65 - 99 mg/dL   Comment 1 Notify RN    Comment 2 Document in Chart   Glucose, capillary     Status: Abnormal   Collection Time: 02/05/16  9:36 PM  Result Value Ref Range   Glucose-Capillary 177 (H) 65 - 99 mg/dL   Comment 1 Notify RN    Comment 2 Document in Chart    Ct Angio Head W Or Wo Contrast  02/04/2016  CLINICAL DATA:  Stroke. Right-sided weakness 2 weeks. Slurred speech. EXAM: CT ANGIOGRAPHY HEAD AND NECK TECHNIQUE: Multidetector CT imaging of the head and neck was performed using the standard protocol during bolus administration of intravenous contrast. Multiplanar CT image reconstructions and MIPs were obtained to evaluate the vascular anatomy. Carotid stenosis measurements (when applicable) are obtained utilizing NASCET criteria, using the distal internal carotid diameter as the denominator. CONTRAST:  50 mL Isovue 370 IV COMPARISON:  CT 02/04/2016 FINDINGS: CTA NECK Aortic arch: Normal aortic arch. Lung apices clear. Proximal great vessels widely patent. Right carotid system: Mild atherosclerotic disease at the right carotid bifurcation without stenosis. No dissection Left carotid system: Mild atherosclerotic disease at the bifurcation without stenosis or dissection. Vertebral arteries:Right  vertebral artery dominant. Right vertebral artery widely patent to the basilar. Left vertebral artery is small and ends in PICA without  significant contribution to the basilar. Skeleton: Cervical degenerative change.  No fracture or bony lesion. Other neck: Right thyroid lesion measuring 20 mm. This has irregular margins and is noncalcified. Thyroid ultrasound and possible biopsy recommended to rule out neoplasm. CTA HEAD Anterior circulation: Mild atherosclerotic calcified gauge in the cavernous carotid bilaterally without significant stenosis. Anterior and middle cerebral arteries widely patent bilaterally without stenosis. Posterior circulation: Right vertebral artery patent to the basilar. Right PICA patent. Left vertebral artery hypoplastic ending in PICA without significant contribution to the basilar. Basilar widely patent. Superior cerebellar artery patent bilaterally. Posterior communicating artery patent bilaterally. Right posterior cerebral artery widely patent Left posterior cerebral artery occluded at the origin with possible faint reconstitution distally. Large subacute infarct left PCA territory. Venous sinuses: Patent Anatomic variants: negative for cerebral aneurysm Delayed phase: Slight enhancement along the periphery of the left PCA infarct compatible with subacute infarction. No enhancing mass lesion. IMPRESSION: Large subacute infarct left PCA territory Left posterior cerebral artery is occluded with faint reconstitution in the midportion. No significant carotid or vertebral artery stenosis in the neck. 2 cm right thyroid lesion. Recommend thyroid ultrasound and biopsy to rule out neoplasm. Electronically Signed   By: Marlan Palauharles  Clark M.D.   On: 02/04/2016 10:34   Ct Head Wo Contrast  02/04/2016  CLINICAL DATA:  Code stroke earlier today. Right-sided weakness for 2 weeks. Left PCA infarct. EXAM: CT HEAD WITHOUT CONTRAST TECHNIQUE: Contiguous axial images were obtained from the base of the skull  through the vertex without intravenous contrast. COMPARISON:  CTA earlier same date. FINDINGS: 1708 hours. Brain: Large subacute infarct in the left posterior cerebral artery distribution is again noted, involving the medial left temporal, parietal and occipital lobes. This has not significantly changed compared with the prior examination. There are chronic small vessel ischemic changes in the left basal ganglia. There is no midline shift, hydrocephalus or evidence of acute intracranial hemorrhage. Intracranial vascular calcifications are noted. Bones/sinuses/visualized face: The visualized paranasal sinuses, mastoid air cells and middle ears are clear. Cold lamina papyracea deformity on the right is noted. The calvarium is intact. IMPRESSION: 1. Stable large subacute left PCA distribution infarct from examination earlier today. 2. No new findings. Electronically Signed   By: Carey BullocksWilliam  Veazey M.D.   On: 02/04/2016 17:39   Ct Head Wo Contrast  02/04/2016  ADDENDUM REPORT: 02/04/2016 09:49 ADDENDUM: Findings discussed with Dr. Amada JupiterKirkpatrick on 02/04/2016 at 0945 hours. Electronically Signed   By: Ulyses SouthwardMark  Boles M.D.   On: 02/04/2016 09:49  02/04/2016  CLINICAL DATA:  Slurred speech, stroke symptoms, onset 2 weeks ago but worse this morning EXAM: CT HEAD WITHOUT CONTRAST TECHNIQUE: Contiguous axial images were obtained from the base of the skull through the vertex without intravenous contrast. COMPARISON:  None FINDINGS: Normal ventricular morphology. No midline shift. Large area of low attenuation involving the LEFT occipital and posterior parietal regions compatible with a LEFT PCA territory infarct. This extends into the posterior LEFT temporal lobe. Mild mass effect upon the atrium of the LEFT lateral ventricle. Old lacunar infarct anterior LEFT basal ganglia into anterior limb LEFT internal capsule. No intracranial hemorrhage or mass lesion. Posterior fossa unremarkable. No extra-axial fluid collections. Bones and  sinuses unremarkable. IMPRESSION: Large area of cortical infarction involving the posterior LEFT parietal and LEFT occipital lobes extending into posterior LEFT temporal lobe, probably subacute in age. Old appearing lacunar infarct anterior LEFT basal ganglia into the anterior limb of LEFT internal capsule. No midline shift or intracranial hemorrhage identified. Electronically  Signed: By: Ulyses Southward M.D. On: 02/04/2016 09:32   Ct Angio Neck W Or Wo Contrast  02/04/2016  CLINICAL DATA:  Stroke. Right-sided weakness 2 weeks. Slurred speech. EXAM: CT ANGIOGRAPHY HEAD AND NECK TECHNIQUE: Multidetector CT imaging of the head and neck was performed using the standard protocol during bolus administration of intravenous contrast. Multiplanar CT image reconstructions and MIPs were obtained to evaluate the vascular anatomy. Carotid stenosis measurements (when applicable) are obtained utilizing NASCET criteria, using the distal internal carotid diameter as the denominator. CONTRAST:  50 mL Isovue 370 IV COMPARISON:  CT 02/04/2016 FINDINGS: CTA NECK Aortic arch: Normal aortic arch. Lung apices clear. Proximal great vessels widely patent. Right carotid system: Mild atherosclerotic disease at the right carotid bifurcation without stenosis. No dissection Left carotid system: Mild atherosclerotic disease at the bifurcation without stenosis or dissection. Vertebral arteries:Right vertebral artery dominant. Right vertebral artery widely patent to the basilar. Left vertebral artery is small and ends in PICA without significant contribution to the basilar. Skeleton: Cervical degenerative change.  No fracture or bony lesion. Other neck: Right thyroid lesion measuring 20 mm. This has irregular margins and is noncalcified. Thyroid ultrasound and possible biopsy recommended to rule out neoplasm. CTA HEAD Anterior circulation: Mild atherosclerotic calcified gauge in the cavernous carotid bilaterally without significant stenosis.  Anterior and middle cerebral arteries widely patent bilaterally without stenosis. Posterior circulation: Right vertebral artery patent to the basilar. Right PICA patent. Left vertebral artery hypoplastic ending in PICA without significant contribution to the basilar. Basilar widely patent. Superior cerebellar artery patent bilaterally. Posterior communicating artery patent bilaterally. Right posterior cerebral artery widely patent Left posterior cerebral artery occluded at the origin with possible faint reconstitution distally. Large subacute infarct left PCA territory. Venous sinuses: Patent Anatomic variants: negative for cerebral aneurysm Delayed phase: Slight enhancement along the periphery of the left PCA infarct compatible with subacute infarction. No enhancing mass lesion. IMPRESSION: Large subacute infarct left PCA territory Left posterior cerebral artery is occluded with faint reconstitution in the midportion. No significant carotid or vertebral artery stenosis in the neck. 2 cm right thyroid lesion. Recommend thyroid ultrasound and biopsy to rule out neoplasm. Electronically Signed   By: Marlan Palau M.D.   On: 02/04/2016 10:34   Mr Brain Wo Contrast  02/04/2016  CLINICAL DATA:  RIGHT-sided weakness for 2 weeks.  Recent worsening EXAM: MRI HEAD WITHOUT CONTRAST TECHNIQUE: Multiplanar, multiecho pulse sequences of the brain and surrounding structures were obtained without intravenous contrast. COMPARISON:  Multiple prior CT studies earlier today. FINDINGS: The patient was unable to remain motionless for the exam. Small or subtle lesions could be overlooked. Marked restricted diffusion in the areas of CT demonstrated to have cytotoxic edema. This is consistent with a acute/subacute LEFT PCA territory infarct affecting the posterior temporal lobe, medial occipital lobe, and much of the thalamus. Restricted diffusion is noted to additionally involve the LEFT midbrain, specifically the LEFT cerebral  peduncle, (see images 24 and 25, series 4) and likely accounts for the RIGHT hemiparesis. Early T2 shortening, and T1 prolongation, reflect gyriform type reperfusion hemorrhage. There is no significant confluent lobar hematoma or measurable volume of blood. Absent flow void LEFT PCA as predicted from CTA. Normal for age cerebral volume. Mild to moderate T2 and FLAIR hyperintensities throughout the white matter, premature for age, representing chronic microvascular ischemic change. There are at least two chronic hemorrhagic lacunar infarcts in the LEFT hemisphere, affecting the caudate as well as the periventricular white matter. Unremarkable midline structures. Negative  orbits. Chronic medial blowout fracture, RIGHT orbit. IMPRESSION: Large acute/subacute LEFT PCA territory infarct. Early hemorrhagic transformation without lobar hematoma. Additional acute infarction of the LEFT midbrain likely accounts for the reported RIGHT hemiparesis. Chronic microvascular ischemic change with areas of chronic lacunar hemorrhagic insult as described. Electronically Signed   By: Elsie StainJohn T Curnes M.D.   On: 02/04/2016 20:17    Assessment/Plan: Diagnosis: Left PCA infarct with right hemiparesis 1. Does the need for close, 24 hr/day medical supervision in concert with the patient's rehab needs make it unreasonable for this patient to be served in a less intensive setting? Yes 2. Co-Morbidities requiring supervision/potential complications: htn, morbid obesity, hx of etoh abuse 3. Due to bladder management, bowel management, safety, skin/wound care, disease management, medication administration and patient education, does the patient require 24 hr/day rehab nursing? Yes 4. Does the patient require coordinated care of a physician, rehab nurse, PT (1-2 hrs/day, 5 days/week), OT (1-2 hrs/day, 5 days/week) and SLP (1-2 hrs/day, 5 days/week) to address physical and functional deficits in the context of the above medical diagnosis(es)?  Yes Addressing deficits in the following areas: balance, endurance, locomotion, strength, transferring, bowel/bladder control, bathing, dressing, feeding, grooming, toileting, cognition, speech and psychosocial support 5. Can the patient actively participate in an intensive therapy program of at least 3 hrs of therapy per day at least 5 days per week? Yes 6. The potential for patient to make measurable gains while on inpatient rehab is excellent 7. Anticipated functional outcomes upon discharge from inpatient rehab are supervision  with PT, supervision and min assist with OT, modified independent with SLP. 8. Estimated rehab length of stay to reach the above functional goals is: likely 16-22 days 9. Does the patient have adequate social supports and living environment to accommodate these discharge functional goals? Yes 10. Anticipated D/C setting: Home 11. Anticipated post D/C treatments: HH therapy and Outpatient therapy 12. Overall Rehab/Functional Prognosis: excellent  RECOMMENDATIONS: This patient's condition is appropriate for continued rehabilitative care in the following setting: CIR Patient has agreed to participate in recommended program. Potentially Note that insurance prior authorization may be required for reimbursement for recommended care.  Comment: Spoke with sister. Rehab Admissions Coordinator to follow up.  Thanks,  Ranelle OysterZachary T. Linc Renne, MD, [place] DomFAAPMR     02/06/2016

## 2016-02-06 NOTE — Consult Note (Signed)
ELECTROPHYSIOLOGY CONSULT NOTE  Patient ID: Ronald Forbes MRN: [id number], DOB/AGE: 1960-11-12   Admit date: 02/04/2016 Date of Consult: 02/06/2016  Primary Physician: No primary care provider on file. Primary Cardiologist: new to [facility] Reason for Consultation: Cryptogenic stroke; recommendations regarding Implantable Loop Recorder  History of Present Illness Ronald Forbes was admitted on 02/04/2016 with right arm weakness and slurred speech for the past 2 weeks.  He reports that his symptoms waxed and waned but became more pronounced on the day of admission which prompted ER evaluation.  Imaging demonstrated dominant L PCA infarct with hemorrhagic transformation felt to be embolic 2/2 unknown source.  He has undergone workup for stroke including echocardiogram and carotid imaging.  The patient has been monitored on telemetry which has demonstrated sinus rhythm with no arrhythmias.  Inpatient stroke work-up is to be completed with a TEE.   Echocardiogram this admission demonstrated EF 55-60%, grade 1 diastolic dysfunction, LA 32.  Lab work is reviewed.  Prior to admission, the patient denies chest pain, shortness of breath, dizziness, palpitations, or syncope.  They are recovering from their stroke with plans to go to CIR at discharge.  EP has been asked to evaluate for placement of an implantable loop recorder to monitor for atrial fibrillation.   Past Medical History  Diagnosis Date  . Stroke [facility])      Surgical History: History reviewed. No pertinent past surgical history.   Prescriptions prior to admission  Medication Sig Dispense Refill Last Dose  . ibuprofen (ADVIL,MOTRIN) 200 MG tablet Take 800 mg by mouth every 6 (six) hours as needed for mild pain.   02/03/2016 at Unknown time  . Multiple Vitamins-Minerals (MULTIVITAMIN WITH MINERALS) tablet Take 1 tablet by mouth daily.   02/03/2016 at Unknown time    Inpatient Medications:  . aspirin  300 mg Rectal Daily   Or  .  aspirin  325 mg Oral Daily  . atorvastatin  40 mg Oral q1800  . enoxaparin (LOVENOX) injection  0.5 mg/kg Subcutaneous Q24H  . folic acid  1 mg Oral Daily  . LORazepam  0-4 mg Oral Q6H   Followed by  . LORazepam  0-4 mg Oral Q12H  . multivitamin with minerals  1 tablet Oral Daily  . nicotine  21 mg Transdermal Daily  . thiamine  100 mg Oral Daily   Or  . thiamine  100 mg Intravenous Daily    Allergies: No Known Allergies  Social History   Social History  . Marital Status: Single    Spouse Name: N/A  . Number of Children: N/A  . Years of Education: N/A   Occupational History  . Not on file.   Social History Main Topics  . Smoking status: Current Every Day Smoker -- 1.00 packs/day for 40 years    Types: Cigarettes  . Smokeless tobacco: Not on file  . Alcohol Use: 0.0 oz/week    0 Standard drinks or equivalent per week     Comment: 6 pack/day  . Drug Use: Yes    Special: Marijuana     Comment: occasional marijuana  . Sexual Activity: Not on file   Other Topics Concern  . Not on file   Social History Narrative   Lives alone   Used to work as a Curator     Family History: pt is unaware of premature CAD    Review of Systems: All other systems reviewed and are otherwise negative except as noted above.  Physical Exam: Filed Vitals:  02/05/16 1700 02/05/16 2136 02/06/16 0100 02/06/16 0405  BP: 140/61 142/90 138/73 152/84  Pulse: 63 65 62 65  Temp: 98.8 F (37.1 C) 98.9 F (37.2 C) 97.9 F (36.6 C) 99 F (37.2 C)  TempSrc: Oral Oral Oral Oral  Resp: 18 16 16 16   Height:      Weight:      SpO2: 95% 97% 98% 96%    GEN- The patient is well appearing, alert and oriented to person Head- normocephalic, atraumatic Eyes-  Sclera clear, conjunctiva pink Ears- hearing intact Oropharynx- clear Neck- supple Lungs- Clear to ausculation bilaterally, normal work of breathing Heart- Regular rate and rhythm, no murmurs, rubs or gallops  GI- soft, NT, ND, +  BS Extremities- no clubbing, cyanosis, or edema MS- no significant deformity or atrophy Skin- no rash or lesion Psych- euthymic mood, full affect   Labs:   Lab Results  Component Value Date   WBC 10.1 02/05/2016   HGB 15.9 02/05/2016   HCT 46.7 02/05/2016   MCV 93.8 02/05/2016   PLT 151 02/05/2016     Recent Labs Lab 02/05/16 0001  NA 135  K 3.6  CL 103  CO2 23  BUN 15  CREATININE 0.91  CALCIUM 9.2  PROT 7.1  BILITOT 0.9  ALKPHOS 53  ALT 29  AST 23  GLUCOSE 131*    Radiology/Studies: Ct Angio Head W Or Wo Contrast 02/04/2016  CLINICAL DATA:  Stroke. Right-sided weakness 2 weeks. Slurred speech. EXAM: CT ANGIOGRAPHY HEAD AND NECK TECHNIQUE: Multidetector CT imaging of the head and neck was performed using the standard protocol during bolus administration of intravenous contrast. Multiplanar CT image reconstructions and MIPs were obtained to evaluate the vascular anatomy. Carotid stenosis measurements (when applicable) are obtained utilizing NASCET criteria, using the distal internal carotid diameter as the denominator. CONTRAST:  50 mL Isovue 370 IV COMPARISON:  CT 02/04/2016 FINDINGS: CTA NECK Aortic arch: Normal aortic arch. Lung apices clear. Proximal great vessels widely patent. Right carotid system: Mild atherosclerotic disease at the right carotid bifurcation without stenosis. No dissection Left carotid system: Mild atherosclerotic disease at the bifurcation without stenosis or dissection. Vertebral arteries:Right vertebral artery dominant. Right vertebral artery widely patent to the basilar. Left vertebral artery is small and ends in PICA without significant contribution to the basilar. Skeleton: Cervical degenerative change.  No fracture or bony lesion. Other neck: Right thyroid lesion measuring 20 mm. This has irregular margins and is noncalcified. Thyroid ultrasound and possible biopsy recommended to rule out neoplasm. CTA HEAD Anterior circulation: Mild atherosclerotic  calcified gauge in the cavernous carotid bilaterally without significant stenosis. Anterior and middle cerebral arteries widely patent bilaterally without stenosis. Posterior circulation: Right vertebral artery patent to the basilar. Right PICA patent. Left vertebral artery hypoplastic ending in PICA without significant contribution to the basilar. Basilar widely patent. Superior cerebellar artery patent bilaterally. Posterior communicating artery patent bilaterally. Right posterior cerebral artery widely patent Left posterior cerebral artery occluded at the origin with possible faint reconstitution distally. Large subacute infarct left PCA territory. Venous sinuses: Patent Anatomic variants: negative for cerebral aneurysm Delayed phase: Slight enhancement along the periphery of the left PCA infarct compatible with subacute infarction. No enhancing mass lesion. IMPRESSION: Large subacute infarct left PCA territory Left posterior cerebral artery is occluded with faint reconstitution in the midportion. No significant carotid or vertebral artery stenosis in the neck. 2 cm right thyroid lesion. Recommend thyroid ultrasound and biopsy to rule out neoplasm. Electronically Signed   By: Leonette Mostharles  Chestine Sporelark M.D.   On: 02/04/2016 10:34   Ct Angio Neck W Or Wo Contrast 02/04/2016  CLINICAL DATA:  Stroke. Right-sided weakness 2 weeks. Slurred speech. EXAM: CT ANGIOGRAPHY HEAD AND NECK TECHNIQUE: Multidetector CT imaging of the head and neck was performed using the standard protocol during bolus administration of intravenous contrast. Multiplanar CT image reconstructions and MIPs were obtained to evaluate the vascular anatomy. Carotid stenosis measurements (when applicable) are obtained utilizing NASCET criteria, using the distal internal carotid diameter as the denominator. CONTRAST:  50 mL Isovue 370 IV COMPARISON:  CT 02/04/2016 FINDINGS: CTA NECK Aortic arch: Normal aortic arch. Lung apices clear. Proximal great vessels widely  patent. Right carotid system: Mild atherosclerotic disease at the right carotid bifurcation without stenosis. No dissection Left carotid system: Mild atherosclerotic disease at the bifurcation without stenosis or dissection. Vertebral arteries:Right vertebral artery dominant. Right vertebral artery widely patent to the basilar. Left vertebral artery is small and ends in PICA without significant contribution to the basilar. Skeleton: Cervical degenerative change.  No fracture or bony lesion. Other neck: Right thyroid lesion measuring 20 mm. This has irregular margins and is noncalcified. Thyroid ultrasound and possible biopsy recommended to rule out neoplasm. CTA HEAD Anterior circulation: Mild atherosclerotic calcified gauge in the cavernous carotid bilaterally without significant stenosis. Anterior and middle cerebral arteries widely patent bilaterally without stenosis. Posterior circulation: Right vertebral artery patent to the basilar. Right PICA patent. Left vertebral artery hypoplastic ending in PICA without significant contribution to the basilar. Basilar widely patent. Superior cerebellar artery patent bilaterally. Posterior communicating artery patent bilaterally. Right posterior cerebral artery widely patent Left posterior cerebral artery occluded at the origin with possible faint reconstitution distally. Large subacute infarct left PCA territory. Venous sinuses: Patent Anatomic variants: negative for cerebral aneurysm Delayed phase: Slight enhancement along the periphery of the left PCA infarct compatible with subacute infarction. No enhancing mass lesion. IMPRESSION: Large subacute infarct left PCA territory Left posterior cerebral artery is occluded with faint reconstitution in the midportion. No significant carotid or vertebral artery stenosis in the neck. 2 cm right thyroid lesion. Recommend thyroid ultrasound and biopsy to rule out neoplasm. Electronically Signed   By: Marlan Palauharles  Clark M.D.   On:  02/04/2016 10:34   Mr Brain Wo Contrast 02/04/2016  CLINICAL DATA:  RIGHT-sided weakness for 2 weeks.  Recent worsening EXAM: MRI HEAD WITHOUT CONTRAST TECHNIQUE: Multiplanar, multiecho pulse sequences of the brain and surrounding structures were obtained without intravenous contrast. COMPARISON:  Multiple prior CT studies earlier today. FINDINGS: The patient was unable to remain motionless for the exam. Small or subtle lesions could be overlooked. Marked restricted diffusion in the areas of CT demonstrated to have cytotoxic edema. This is consistent with a acute/subacute LEFT PCA territory infarct affecting the posterior temporal lobe, medial occipital lobe, and much of the thalamus. Restricted diffusion is noted to additionally involve the LEFT midbrain, specifically the LEFT cerebral peduncle, (see images 24 and 25, series 4) and likely accounts for the RIGHT hemiparesis. Early T2 shortening, and T1 prolongation, reflect gyriform type reperfusion hemorrhage. There is no significant confluent lobar hematoma or measurable volume of blood. Absent flow void LEFT PCA as predicted from CTA. Normal for age cerebral volume. Mild to moderate T2 and FLAIR hyperintensities throughout the white matter, premature for age, representing chronic microvascular ischemic change. There are at least two chronic hemorrhagic lacunar infarcts in the LEFT hemisphere, affecting the caudate as well as the periventricular white matter. Unremarkable midline structures. Negative orbits. Chronic medial blowout  fracture, RIGHT orbit. IMPRESSION: Large acute/subacute LEFT PCA territory infarct. Early hemorrhagic transformation without lobar hematoma. Additional acute infarction of the LEFT midbrain likely accounts for the reported RIGHT hemiparesis. Chronic microvascular ischemic change with areas of chronic lacunar hemorrhagic insult as described. Electronically Signed   By: Elsie Stain M.D.   On: 02/04/2016 20:17    12-lead ECG sinus  rhythm, rate 63, iRBBB  Telemetry sinus rhythm  Assessment and Plan:  1. Cryptogenic stroke The patient presents with cryptogenic stroke.  The patient has a TEE planned for this AM.  I spoke at length with the patient about monitoring for afib with an implantable loop recorder.  I am not confident in his ability to understand our conversation and make his own medical decisions at this point. I called and spoke with his sister-in-law who stated that patient's brother is coming to the hospital later today. I will plan on talking with him when he arrives and make decision about ILR.   Please call with questions.   Gypsy Balsam, NP 02/06/2016 7:39 AM  Addendum: Pt's brother still not available to speak with.  Looks like patient may be here until Monday for inpatient rehab. Will reassess then. If patient discharged over the weekend, please have him follow up with EP as an outpatient to discuss ILR.  Gypsy Balsam, NP 02/06/2016 3:28 PM  EP Attending  Patient seen and examined. Agree with above. The patient has had a cryptogenic stroke and dense right HP and expressive aphasia. He has an indication for ILR but appears to be unable to understand at this time. We will attempt to obtain consent and perform ILR on Monday.  Leonia Reeves.D.

## 2016-02-06 NOTE — Progress Notes (Signed)
STROKE TEAM PROGRESS NOTE   SUBJECTIVE (INTERVAL HISTORY) Patient just back from TEE without SOE found. EP following for possible loop, to speak with brother. Patient without new complaints. Will need therapy.    OBJECTIVE Temp:  [97.9 F (36.6 C)-99.9 F (37.7 C)] 98 F (36.7 C) (06/23 1018) Pulse Rate:  [59-97] 61 (06/23 1018) Cardiac Rhythm:  [-] Normal sinus rhythm (06/23 0953) Resp:  [15-24] 18 (06/23 1018) BP: (115-190)/(61-126) 138/80 mmHg (06/23 1018) SpO2:  [93 %-100 %] 98 % (06/23 1018) Weight:  [122.2 kg (269 lb 6.4 oz)] 122.2 kg (269 lb 6.4 oz) (06/23 0825)  CBC:  Recent Labs Lab 02/04/16 0849  02/04/16 1601 02/05/16 0001  WBC 9.9  --  9.6 10.1  NEUTROABS 7.2  --   --   --   HGB 16.7  < > 15.8 15.9  HCT 47.4  < > 45.9 46.7  MCV 91.7  --  92.0 93.8  PLT 148*  --  144* 151  < > = values in this interval not displayed.  Basic Metabolic Panel:   Recent Labs Lab 02/04/16 0849 02/04/16 0859 02/04/16 1601 02/05/16 0001  NA 136 138  --  135  K 4.9 4.3  --  3.6  CL 104 104  --  103  CO2 23  --   --  23  GLUCOSE 139* 141*  --  131*  BUN 15 21*  --  15  CREATININE 0.82 0.80 0.88 0.91  CALCIUM 9.2  --   --  9.2    Lipid Panel:     Component Value Date/Time   CHOL 221* 02/05/2016 0001   TRIG 364* 02/05/2016 0001   HDL 36* 02/05/2016 0001   CHOLHDL 6.1 02/05/2016 0001   VLDL 73* 02/05/2016 0001   LDLCALC 112* 02/05/2016 0001   HgbA1c:  Lab Results  Component Value Date   HGBA1C 6.6* 02/05/2016   Urine Drug Screen:     Component Value Date/Time   LABOPIA NONE DETECTED 02/04/2016 1025   COCAINSCRNUR NONE DETECTED 02/04/2016 1025   LABBENZ NONE DETECTED 02/04/2016 1025   AMPHETMU NONE DETECTED 02/04/2016 1025   THCU POSITIVE* 02/04/2016 1025   LABBARB NONE DETECTED 02/04/2016 1025      IMAGING I have personally reviewed the radiological images below and agree with the radiology interpretations.  Ct Head Wo Contrast 02/04/2016   1. Stable  large subacute left PCA distribution infarct from examination earlier today. 2. No new findings.   Ct Head Wo Contrast 02/04/2016  Large area of cortical infarction involving the posterior LEFT parietal and LEFT occipital lobes extending into posterior LEFT temporal lobe, probably subacute in age. Old appearing lacunar infarct anterior LEFT basal ganglia into the anterior limb of LEFT internal capsule. No midline shift or intracranial hemorrhage identified.   Ct Angio Head & Neck W Or Wo Contrast 02/04/2016  Large subacute infarct left PCA territory Left posterior cerebral artery is occluded with faint reconstitution in the midportion. No significant carotid or vertebral artery stenosis in the neck. 2 cm right thyroid lesion. Recommend thyroid ultrasound and biopsy to rule out neoplasm.   Mr Brain Wo Contrast 02/04/2016  Large acute/subacute LEFT PCA territory infarct. Early hemorrhagic transformation without lobar hematoma. Additional acute infarction of the LEFT midbrain likely accounts for the reported RIGHT hemiparesis. Chronic microvascular ischemic change with areas of chronic lacunar hemorrhagic insult as described.   LE Venous Doppler Bilateral: No evidence of DVT, superficial thrombosis, or Baker's Cyst.  TTE  -  Left ventricle: The cavity size was normal. Wall thickness was increased in a pattern of mild LVH. Systolic function was normal. The estimated ejection fraction was in the range of 55% to 60%. Wall motion was normal; there were no regional wall motion abnormalities. Doppler parameters are consistent with abnormal left ventricular relaxation (grade 1 diastolic dysfunction). - Atrial septum: No defect or patent foramen ovale was identified.  TEE No SOE   PHYSICAL EXAM General - Well nourished, well developed, in no apparent distress.  Ophthalmologic - Fundi not visualized due to noncooperation.  Cardiovascular - Regular rate and rhythm.  Mental Status -  Awake, alert,  orientated to place, but not orientated to time or people. Language including expression, repetition, comprehension was assessed and found intact, however, has anomia. Fund of Knowledge was assessed and was impaired.  Cranial Nerves II - XII - II - right homonymous hemianopia. III, IV, VI - Extraocular movements intact. V - right facial decreased sensation, 50% of left. VII - right facial droop. VIII - Hearing & vestibular intact bilaterally. X - Palate elevates symmetrically. XI - Chin turning & shoulder shrug intact bilaterally. XII - Tongue protrusion intact.  Motor Strength - The patient's strength was normal in LUE and LLE, and 0/5 RUE and 2/5 RLE.  Bulk was normal and fasciculations were absent.   Motor Tone - Muscle tone was assessed at the neck and appendages and was normal.  Reflexes - The patient's reflexes were 1+ in all extremities and he had no pathological reflexes.  Sensory - Light touch, temperature/pinprick were assessed and were decreased on the right, 20% of left.    Coordination - The patient had normal movements in the left hand with no ataxia or dysmetria.  Tremor was absent.  Gait and Station - not tested due to weakness.   ASSESSMENT/PLAN Ronald Forbes is a [age] y.o. male with history of alcohol and cigarette use presenting with right sided weakness. He did not receive IV t-PA due to delay in arrival.   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 2cm R thyroid lesion.  2D Echo  EF 55-60%   LE doppler negative for DVT  TEE no SOE  Loop recorder pending to evaluate for atrial fibrillation as etiology of stroke. Card will speak with brother next week.   LDL 112  HgbA1c 6.6  Hypercoagulable work up - CRP 1.2, labs otherwise normal or  pending  Lovenox 60 mg sq daily for VTE prophylaxis Diet heart healthy/carb modified Room service appropriate?: Yes; Fluid consistency:: Thin  No antithrombotic prior to admission, now on aspirin 325 mg daily.   Patient counseled to be compliant with his antithrombotic medications  Ongoing aggressive stroke risk factor management  Therapy recommendations:  CIR  Disposition:  pending   Hypertension  Stable  Permissive hypertension (OK if < 220/120) but gradually normalize in 5-7 days  Long-term BP goal normotensive  Hyperlipidemia  Home meds:  No statin  LDL 112, goal < 70  Now on lipitor 40 mg daily  Continue statin at discharge  Tobacco abuse  Current smoker  Smoking cessation counseling provided  Nicotine patch provided  Pt is willing to quit  Other Stroke Risk Factors  ETOH use, advised to drink no more than 2 drink(s) a day  Marijuana use, UDS  positive for THC on admission. Patient advised to stop use.   Obesity, Body mass index is 33.67 kg/(m^2)., recommend weight loss, diet and exercise as appropriate   Other Active Problems  Elevated total protein  thyroid nodule seen on CTA. Will need OP followup  Hospital day # 2  Neurology will sign off. Please call with questions. [facility]. Thanks for the consult.  Marvel PlanJindong Kelin Borum, MD PhD Stroke Neurology 02/06/2016 5:06 PM    To contact Stroke Continuity provider, please refer to [email or web address]. After hours, contact General Neurology

## 2016-02-06 NOTE — Progress Notes (Signed)
PT Cancellation Note  Patient Details Name: Ronald Forbes MRN: [id number] DOB: 11-Jan-1961   Cancelled Treatment:    Reason Eval/Treat Not Completed: Patient declined, no reason specified.  Couldn't get a "handle" on whether pt mad at therapist's 17:45 arrival or just tired.  "I don't feel like doing anything now." 02/06/2016   Ronald Forbes, PT [phone]  (pager)   Arnulfo Batson, Ronald Forbes 02/06/2016, 5:51 PM

## 2016-02-06 NOTE — CV Procedure (Addendum)
During this procedure the patient is administered a total of Versed 5 mg and Fentanyl 50 mg and Benddryl 50 mg to achieve and maintain moderate conscious sedation.  The patient's heart rate, blood pressure, and oxygen saturation are monitored continuously during the procedure. The period of conscious sedation is 45 minutes, of which I was present face-to-face 100% of this time.  See note in [employer] LVH No SOE  Charlton HawsPeter Wissam Resor

## 2016-02-06 NOTE — Progress Notes (Signed)
Patient returned to room from TEE. Pt noted lethargic but arousable, able to answer appropriate questions. VSS. Pt denied any distress, TELE reapplied and confirmed. Lunch tray order. Will continue to monitor.   Sim BoastHavy, RN

## 2016-02-06 NOTE — Progress Notes (Signed)
I will follow up with pt and family to discuss a possible inpt rehab admission pending medical work up  Completion and bed availability when medically ready. I will follow up Monday if pt remains in house. [phone]

## 2016-02-06 NOTE — Interval H&P Note (Signed)
History and Physical Interval Note:  02/06/2016 7:48 AM  Ronald HobbyKevin Kinchen  has presented today for surgery, with the diagnosis of STROKE  The various methods of treatment have been discussed with the patient and family. After consideration of risks, benefits and other options for treatment, the patient has consented to  Procedure(s): TRANSESOPHAGEAL ECHOCARDIOGRAM (TEE) (N/A) as a surgical intervention .  The patient's history has been reviewed, patient examined, no change in status, stable for surgery.  I have reviewed the patient's chart and labs.  Questions were answered to the patient's satisfaction.     Charlton HawsPeter Merrill Villarruel

## 2016-02-06 NOTE — Discharge Summary (Signed)
Name: Ronald Forbes MRN: [id number] DOB: 1960/08/21 55 y.o. PCP: No primary care provider on file.  Date of Admission: 02/04/2016  8:39 AM Date of Discharge: 02/10/2016 Attending Physician: Earl Lagos, MD  Discharge Diagnosis: 1. 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  Start taking on:  02/11/2016     atorvastatin 40 MG tablet  Commonly known as:  LIPITOR  Take 1 tablet (40 mg total) by mouth daily at 6 PM.     ibuprofen 200 MG tablet  Commonly known as:  ADVIL,MOTRIN  Take 800 mg by mouth every 6 (six) hours as needed for mild pain.     multivitamin with minerals tablet  Take 1 tablet by mouth daily.        Disposition and follow-up:   RonaldAamir Forbes was discharged from [facility] in Stable condition.  At the hospital follow up visit please address:  1.  Continued speech and physical therapy. Continue Aspirin and Atorvastatin. Monitor for Hypertension and Diabetes to modify risks for recurrent stroke. Follow up with Neurology and Cardiology.  Consider thyroid U/S to evaluate thyroid nodule and possible biopsy.  T2DM: Monitor blood sugars, consider starting Metformin if not diet controlled.  2.  Labs / imaging needed at time of follow-up: Hgb A1C 3 months post-discharge, follow up Loop Recorder readings  3.  Pending labs/ test needing follow-up: none  Follow-up Appointments: Follow-up Information    Follow up with Xu,Jindong, MD. Schedule an appointment as soon as possible  for a visit in 2 months.   Specialty:  Neurology   Why:  stroke clinic   Contact information:   [address] [place] [address] [phone]       Follow up with [facility] Office On 02/19/2016.   Specialty:  Cardiology   Why:  12:00PM, noon, wound check   Contact information:   [address] [place] [address] [phone]      Discharge Instructions: Discharge Instructions    Ambulatory referral to Neurology    Complete by:  As directed   Pt will follow up with Dr. Roda Shutters at [facility] in about 2 months. Thanks.     Call MD for:  difficulty breathing, headache or visual disturbances    Complete by:  As directed      Call MD for:  persistant dizziness or light-headedness    Complete by:  As directed      Call MD for:  persistant nausea and vomiting    Complete by:  As directed      Call MD for:  severe uncontrolled pain    Complete by:  As directed      Call MD for:  temperature >100.4    Complete by:  As directed      Care order/instruction    Complete by:  As directed   Remove bulky dressing in Am Steristrips in place until seen  In office Tagaderm  dressing to remain in place x 5 days Keep wound dry for 24 hours Wound check in office , to be scheduled prior to release     Diet - low sodium heart healthy    Complete by:  As directed      Increase activity slowly    Complete by:  As directed            Consultations:    Procedures Performed:  Ct Angio Head W Or Wo Contrast  02/04/2016  CLINICAL DATA:  Stroke. Right-sided weakness 2 weeks. Slurred speech. EXAM: CT ANGIOGRAPHY HEAD AND NECK TECHNIQUE: Multidetector CT imaging of the head and neck was performed using the standard protocol during bolus administration of intravenous contrast. Multiplanar CT image reconstructions and MIPs were obtained to evaluate the vascular anatomy. Carotid stenosis measurements (when applicable) are obtained utilizing NASCET  criteria, using the distal internal carotid diameter as the denominator. CONTRAST:  50 mL Isovue 370 IV COMPARISON:  CT 02/04/2016 FINDINGS: CTA NECK Aortic arch: Normal aortic arch. Lung apices clear. Proximal great vessels widely patent. Right carotid system: Mild atherosclerotic disease at the right carotid bifurcation without stenosis. No dissection Left carotid system: Mild atherosclerotic disease at the bifurcation without stenosis or dissection. Vertebral arteries:Right vertebral artery dominant. Right vertebral artery widely patent to the basilar. Left vertebral artery is small and ends in PICA without significant contribution to the basilar. Skeleton: Cervical degenerative change.  No fracture or bony lesion. Other neck: Right thyroid lesion measuring 20 mm. This has irregular margins and is noncalcified. Thyroid ultrasound and possible biopsy recommended to rule out neoplasm. CTA HEAD Anterior circulation: Mild atherosclerotic calcified gauge in the cavernous carotid bilaterally without significant stenosis. Anterior and middle cerebral arteries widely patent bilaterally without stenosis. Posterior circulation: Right vertebral artery patent to the basilar. Right PICA patent. Left vertebral artery hypoplastic ending in PICA without significant contribution to the basilar. Basilar widely patent. Superior cerebellar artery patent bilaterally. Posterior communicating artery patent bilaterally. Right posterior cerebral artery widely patent Left posterior cerebral artery occluded at the origin with possible faint reconstitution distally. Large subacute infarct left PCA territory. Venous sinuses: Patent Anatomic variants: negative for cerebral aneurysm Delayed phase: Slight enhancement along the periphery of the left PCA infarct compatible with subacute infarction. No enhancing mass lesion. IMPRESSION: Large subacute infarct left PCA territory Left posterior cerebral artery is occluded with faint reconstitution  in the midportion. No significant carotid or vertebral artery stenosis in the neck. 2 cm right thyroid lesion. Recommend thyroid ultrasound and biopsy to rule out neoplasm. Electronically Signed   By: Marlan Palau M.D.   On: 02/04/2016 10:34   Ct Head Wo Contrast  02/04/2016  CLINICAL DATA:  Code stroke earlier today. Right-sided weakness for 2 weeks. Left PCA infarct. EXAM: CT HEAD WITHOUT CONTRAST TECHNIQUE: Contiguous axial images were obtained from the base of the skull through the vertex without intravenous contrast. COMPARISON:  CTA earlier same date. FINDINGS: 1708 hours. Brain: Large subacute infarct in the left posterior cerebral artery distribution is again noted, involving the medial left temporal, parietal and occipital lobes. This has not significantly changed compared with the prior examination. There are chronic small vessel ischemic changes in the left basal ganglia. There is no midline shift, hydrocephalus or evidence of acute intracranial hemorrhage. Intracranial vascular calcifications are noted. Bones/sinuses/visualized face: The visualized paranasal sinuses, mastoid air cells and middle ears are clear. Cold lamina papyracea deformity on the right is noted. The calvarium is intact. IMPRESSION: 1. Stable large  subacute left PCA distribution infarct from examination earlier today. 2. No new findings. Electronically Signed   By: Carey BullocksWilliam  Veazey M.D.   On: 02/04/2016 17:39   Ct Head Wo Contrast  02/04/2016  ADDENDUM REPORT: 02/04/2016 09:49 ADDENDUM: Findings discussed with Dr. Amada JupiterKirkpatrick on 02/04/2016 at 0945 hours. Electronically Signed   By: Ulyses SouthwardMark  Boles M.D.   On: 02/04/2016 09:49  02/04/2016  CLINICAL DATA:  Slurred speech, stroke symptoms, onset 2 weeks ago but worse this morning EXAM: CT HEAD WITHOUT CONTRAST TECHNIQUE: Contiguous axial images were obtained from the base of the skull through the vertex without intravenous contrast. COMPARISON:  None FINDINGS: Normal ventricular  morphology. No midline shift. Large area of low attenuation involving the LEFT occipital and posterior parietal regions compatible with a LEFT PCA territory infarct. This extends into the posterior LEFT temporal lobe. Mild mass effect upon the atrium of the LEFT lateral ventricle. Old lacunar infarct anterior LEFT basal ganglia into anterior limb LEFT internal capsule. No intracranial hemorrhage or mass lesion. Posterior fossa unremarkable. No extra-axial fluid collections. Bones and sinuses unremarkable. IMPRESSION: Large area of cortical infarction involving the posterior LEFT parietal and LEFT occipital lobes extending into posterior LEFT temporal lobe, probably subacute in age. Old appearing lacunar infarct anterior LEFT basal ganglia into the anterior limb of LEFT internal capsule. No midline shift or intracranial hemorrhage identified. Electronically Signed: By: Ulyses SouthwardMark  Boles M.D. On: 02/04/2016 09:32   Ct Angio Neck W Or Wo Contrast  02/04/2016  CLINICAL DATA:  Stroke. Right-sided weakness 2 weeks. Slurred speech. EXAM: CT ANGIOGRAPHY HEAD AND NECK TECHNIQUE: Multidetector CT imaging of the head and neck was performed using the standard protocol during bolus administration of intravenous contrast. Multiplanar CT image reconstructions and MIPs were obtained to evaluate the vascular anatomy. Carotid stenosis measurements (when applicable) are obtained utilizing NASCET criteria, using the distal internal carotid diameter as the denominator. CONTRAST:  50 mL Isovue 370 IV COMPARISON:  CT 02/04/2016 FINDINGS: CTA NECK Aortic arch: Normal aortic arch. Lung apices clear. Proximal great vessels widely patent. Right carotid system: Mild atherosclerotic disease at the right carotid bifurcation without stenosis. No dissection Left carotid system: Mild atherosclerotic disease at the bifurcation without stenosis or dissection. Vertebral arteries:Right vertebral artery dominant. Right vertebral artery widely patent to the  basilar. Left vertebral artery is small and ends in PICA without significant contribution to the basilar. Skeleton: Cervical degenerative change.  No fracture or bony lesion. Other neck: Right thyroid lesion measuring 20 mm. This has irregular margins and is noncalcified. Thyroid ultrasound and possible biopsy recommended to rule out neoplasm. CTA HEAD Anterior circulation: Mild atherosclerotic calcified gauge in the cavernous carotid bilaterally without significant stenosis. Anterior and middle cerebral arteries widely patent bilaterally without stenosis. Posterior circulation: Right vertebral artery patent to the basilar. Right PICA patent. Left vertebral artery hypoplastic ending in PICA without significant contribution to the basilar. Basilar widely patent. Superior cerebellar artery patent bilaterally. Posterior communicating artery patent bilaterally. Right posterior cerebral artery widely patent Left posterior cerebral artery occluded at the origin with possible faint reconstitution distally. Large subacute infarct left PCA territory. Venous sinuses: Patent Anatomic variants: negative for cerebral aneurysm Delayed phase: Slight enhancement along the periphery of the left PCA infarct compatible with subacute infarction. No enhancing mass lesion. IMPRESSION: Large subacute infarct left PCA territory Left posterior cerebral artery is occluded with faint reconstitution in the midportion. No significant carotid or vertebral artery stenosis in the neck. 2 cm right thyroid lesion. Recommend thyroid ultrasound and  biopsy to rule out neoplasm. Electronically Signed   By: Marlan Palau M.D.   On: 02/04/2016 10:34   Mr Brain Wo Contrast  02/04/2016  CLINICAL DATA:  RIGHT-sided weakness for 2 weeks.  Recent worsening EXAM: MRI HEAD WITHOUT CONTRAST TECHNIQUE: Multiplanar, multiecho pulse sequences of the brain and surrounding structures were obtained without intravenous contrast. COMPARISON:  Multiple prior CT  studies earlier today. FINDINGS: The patient was unable to remain motionless for the exam. Small or subtle lesions could be overlooked. Marked restricted diffusion in the areas of CT demonstrated to have cytotoxic edema. This is consistent with a acute/subacute LEFT PCA territory infarct affecting the posterior temporal lobe, medial occipital lobe, and much of the thalamus. Restricted diffusion is noted to additionally involve the LEFT midbrain, specifically the LEFT cerebral peduncle, (see images 24 and 25, series 4) and likely accounts for the RIGHT hemiparesis. Early T2 shortening, and T1 prolongation, reflect gyriform type reperfusion hemorrhage. There is no significant confluent lobar hematoma or measurable volume of blood. Absent flow void LEFT PCA as predicted from CTA. Normal for age cerebral volume. Mild to moderate T2 and FLAIR hyperintensities throughout the white matter, premature for age, representing chronic microvascular ischemic change. There are at least two chronic hemorrhagic lacunar infarcts in the LEFT hemisphere, affecting the caudate as well as the periventricular white matter. Unremarkable midline structures. Negative orbits. Chronic medial blowout fracture, RIGHT orbit. IMPRESSION: Large acute/subacute LEFT PCA territory infarct. Early hemorrhagic transformation without lobar hematoma. Additional acute infarction of the LEFT midbrain likely accounts for the reported RIGHT hemiparesis. Chronic microvascular ischemic change with areas of chronic lacunar hemorrhagic insult as described. Electronically Signed   By: Elsie Stain M.D.   On: 02/04/2016 20:17    2D Echo: TTE 02/05/16 - Left ventricle: The cavity size was normal. Wall thickness was  increased in a pattern of mild LVH. Systolic function was normal.  The estimated ejection fraction was in the range of 55% to 60%.  Wall motion was normal; there were no regional wall motion  abnormalities. Doppler parameters are consistent  with abnormal  left ventricular relaxation (grade 1 diastolic dysfunction). - Atrial septum: No defect or patent foramen ovale was identified.  TEE 02/06/16 - Left ventricle: Wall thickness was increased in a pattern of  moderate LVH. Systolic function was normal. The estimated  ejection fraction was in the range of 55% to 60%. Wall motion was  normal; there were no regional wall motion abnormalities. - Left atrium: No evidence of thrombus in the atrial cavity or  appendage. - Right atrium: No evidence of thrombus in the atrial cavity or  appendage. - Atrial septum: Atrial septum redundant and mobile but no PFO seen  by 2D / color flow and bubble study negative. Echo contrast study  showed no right-to-left atrial level shunt, at baseline or with  provocation.  Impressions:  - No cardiac source of emboli was indentified.  TEE 02/06/2016  - Left ventricle: Wall thickness was increased in a pattern of  moderate LVH. Systolic function was normal. The estimated  ejection fraction was in the range of 55% to 60%. Wall motion was  normal; there were no regional wall motion abnormalities. - Left atrium: No evidence of thrombus in the atrial cavity or  appendage. - Right atrium: No evidence of thrombus in the atrial cavity or  appendage. - Atrial septum: Atrial septum redundant and mobile but no PFO seen  by 2D / color flow and bubble study negative. Echo contrast  study  showed no right-to-left atrial level shunt, at baseline or with  provocation.  Impressions:  - No cardiac source of emboli was indentified.    Cardiac Cath: n/a  Admission HPI:  Mr. Laverle HobbyKevin Ditmars is a 55 year old male with PMH of alcohol and tobacco use who presented to the ED with slurred speech and right sided weakness. History is limited due to patient status. Patient states that he began to have severe headaches about 2 weeks ago that were not relieved with OTC tylenol. He says the headaches  progressively worsened and he noticed numbness/tingling and some weakness in his right side. He says this slowly worsened until yesterday when he was unable to move his right arm or leg. He reports slurred speech with trouble speaking his intended words, but understands when spoken to and recognizes when he is saying words he does not mean to say. He reports decreased sensation on the right side of his face.  In the ED, CT head showed a large cortical infarct involving the posterior left parietal and occipital lobes extending into the posterior left temporal lobe, likely subacute. An old infarct of the left basal ganglia was also seen. CTA Head and Neck showed a large subacute infarct of the left PCA territory. The left posterior cerebral artery is occluded. A 2 cm right thyroid lesion was incidentally seen.  Social Hx: 1 PPD for 40 years, drinks 6 pack daily, occasional marijuana, no cocaine/heroin/IVDU  Family Hx: No known family history.  Hospital Course by problem list: Principal Problem:   CVA (cerebral vascular accident) (HCC) Active Problems:   Tobacco abuse   Alcohol use disorder (HCC)   Morbid obesity (HCC)   HLD (hyperlipidemia)   Diabetes mellitus type 2 in obese [facility](HCC)   Cerebrovascular accident (CVA) due to thrombosis of left posterior cerebral artery (HCC)   Left PCA territory and midbrain infarct: CTA Head and Neck showed a large subacute infarct of the left PCA territory. The left posterior cerebral artery is occluded. MRI shows a large acute/subacute PCA territory infarct and acute left midbrain infarct. LE dopplers were negative for DVT. TTE with EF 55-60%, no PFO. Neurology initially felt patients stroke was of embolic cause with unknown source. TEE was negative for embolic source. EP were consulted to evaluate for implantable loop recorder to assess for arrhythmia.This was placed on 6/26 without complication. Patient had continued deficit of right hemianopsia with right-sided  hemiparesis. He had continued fluent aphasia which showed some improvement with speech therapy. He was discharged to outpatient rehab in stable condition.   Elevated total protein: On admission, AST/ALT were 47/25 with mild increased bilirubin of 1.7 and total protein elevated at >12.0. UA showed proteinuria at 100 mg/dL. Repeat CMP normalized. HCV and HIV antibody were negative  Thyroid nodule: 2 cm right thyroid lesion was seen on CTA. TSH was slightly low at 0.318. Free T4 was 0.91, T3 was 2.9. Consider thyroid U/S and possible biopsy outpatient.  Type 2 diabetes: A1c 6.6 which is borderline and consistent with CBGs throughout his hospital stay of mid 100s to low 200s. Recommend lifestyle modification and starting Metformin if not well controlled.   Discharge Vitals:   BP 113/75 mmHg  Pulse 78  Temp(Src) 98.9 F (37.2 C) (Oral)  Resp 20  Ht 6\' 3"  (1.905 m)  Wt 269 lb 6.4 oz (122.2 kg)  BMI 33.67 kg/m2  SpO2 99%  Discharge Labs:  Results for orders placed or performed during the hospital encounter of 02/04/16 (  from the past 24 hour(s))  Glucose, capillary     Status: Abnormal   Collection Time: 02/09/16  4:43 PM  Result Value Ref Range   Glucose-Capillary 127 (H) 65 - 99 mg/dL  Glucose, capillary     Status: Abnormal   Collection Time: 02/09/16 10:00 PM  Result Value Ref Range   Glucose-Capillary 153 (H) 65 - 99 mg/dL   Comment 1 Notify RN    Comment 2 Document in Chart   Glucose, capillary     Status: Abnormal   Collection Time: 02/10/16  6:53 AM  Result Value Ref Range   Glucose-Capillary 152 (H) 65 - 99 mg/dL   Comment 1 Notify RN    Comment 2 Document in Chart   Glucose, capillary     Status: Abnormal   Collection Time: 02/10/16 11:14 AM  Result Value Ref Range   Glucose-Capillary 193 (H) 65 - 99 mg/dL    Signed: Darreld Mclean, MD 02/10/2016, 2:08 PM    Services Ordered on Discharge: SNF Equipment Ordered on Discharge: To be decided at next venue.

## 2016-02-06 NOTE — Progress Notes (Signed)
Echocardiogram Echocardiogram Transesophageal has been performed.  Ronald Forbes 02/06/2016, 9:41 AM

## 2016-02-06 NOTE — Clinical Documentation Improvement (Signed)
Internal Medicine Neurology  Please clarify the clinical significance, if any, of the patients cerebral edema noted in MR of brain dated 02/05/16. Please document findings in next progress note. Thank you!   Other  Clinically Undetermined  Supporting Information:  "Marked restricted diffusion in the areas of CT demonstrated to have cytotoxic edema".   Please exercise your independent, professional judgment when responding. A specific answer is not anticipated or expected.  Thank You, Ruthine DoseEileen Ethne Jeon RN, BSN, CCDS Clinical Documentation Specialist [facility] [phone]; cell [phone]

## 2016-02-06 NOTE — Progress Notes (Signed)
Subjective: Patient seen prior to transport for TEE. He has not noticed much improvement in his right sided weakness or visual deficit overnight.   Objective: Vital signs in last 24 hours: Filed Vitals:   02/05/16 2136 02/06/16 0100 02/06/16 0405 02/06/16 0825  BP: 142/90 138/73 152/84 144/90  Pulse: 65 62 65 59  Temp: 98.9 F (37.2 C) 97.9 F (36.6 C) 99 F (37.2 C) 98.5 F (36.9 C)  TempSrc: Oral Oral Oral Oral  Resp: 16 16 16 21   Height:    6\' 3"  (1.905 m)  Weight:    269 lb 6.4 oz (122.2 kg)  SpO2: 97% 98% 96% 96%   Weight change:   Intake/Output Summary (Last 24 hours) at 02/06/16 0848 Last data filed at 02/06/16 0811  Gross per 24 hour  Intake      0 ml  Output      0 ml  Net      0 ml     General: resting in bed, no acute distress HEENT: PRRL left more constricted compared to right, right lateral visual field deficit Cardiac: RRR, no rubs, murmurs or gallops Pulm: clear to auscultation bilaterally, moving normal volumes of air Abd: soft, nontender, nondistended, BS present Ext: warm and well perfused, no pedal edema Neuro: alert and oriented to person, knows birthdate, cannot say year, fluent aphasia, RUE 1/5, RLE 3/5, LUE and LLE 5/5. Can slightly squeeze fingers with right hand, cannot wiggle toes on right leg.   Assessment/Plan: Principal Problem:   CVA (cerebral vascular accident) (HCC) Active Problems:   Tobacco abuse   Alcohol use disorder (HCC)   Morbid obesity (HCC)   HLD (hyperlipidemia)  Left PCA territory and midbrain infarct: CTA Head and Neck showed a large subacute infarct of the left PCA territory. The left posterior cerebral artery is occluded. MRI shows a large acute/subacute PCA territory infarct and acute left midbrain infarct. TTE with EF 55-60%, no PFO. Neurology feels patients stroke is of embolic cause of unknown source. They have arranged for TEE today and consulted EP to evaluate for implantable loop recorder to assess for  arrhythmia.  Patient with continued right sided weakness, fluent aphasia, and right lateral visual field deficit. -Neurology following, appreciate assistance -EP consulted for possible ILR -f/u TEE -LE dopplers negative for DVT -ASA 325 mg -Atorvastatin 40 mg daily -PT/OT/SLP eval recommending CIR. -Hgb A1C is 6.6 -Lipid panel: Cholesterol 221, Triglycerides 364, HDL 36, LDL 112 -Telemetry -Continue risk factor modification -Permissive HTN <220/120  Elevated total protein: On admission, AST/ALT is 47/25 with mild increased bilirubin of 1.7 and total protein elevated at >12.0. UA does show proteinuria at 100 mg/dL. Repeat CMP has normalized. -HCV antibody pending -HIV antibody negative  Thyroid nodule: 2 cm right thyroid lesion was seen on CTA. TSH slightly low at 0.318.  -Free T4 is 0.91, T3 pending -Consider thyroid U/S and possible biopsy outpatient  EtOH use: Reports drinking a 6 pack nightly, denies history of withdrawal or seizures. -CIWA protocol -Thiamine, Folic Acid, MVM  Tobacco use: Smoked 1 PPD for ~40 years.  -Smoking cessation counseling -Nicotine patch  Dispo: Disposition is deferred at this time, awaiting improvement of current medical problems.  Anticipated discharge in approximately 2-3 day(s).   The patient does not have a current PCP (No primary care provider on file.) and [facility].  The patient does have transportation limitations that hinder transportation to clinic appointments.    LOS: 2 days  Darreld McleanVishal Jahmiya Guidotti, MD 02/06/2016, 8:48 AM

## 2016-02-07 DIAGNOSIS — E669 Obesity, unspecified: Secondary | ICD-10-CM

## 2016-02-07 DIAGNOSIS — E1169 Type 2 diabetes mellitus with other specified complication: Secondary | ICD-10-CM

## 2016-02-07 LAB — GLUCOSE, CAPILLARY
GLUCOSE-CAPILLARY: 118 mg/dL — AB (ref 65–99)
GLUCOSE-CAPILLARY: 124 mg/dL — AB (ref 65–99)
Glucose-Capillary: 167 mg/dL — ABNORMAL HIGH (ref 65–99)
Glucose-Capillary: 139 mg/dL — ABNORMAL HIGH (ref 65–99)

## 2016-02-07 LAB — CARDIOLIPIN ANTIBODIES, IGG, IGM, IGA
Anticardiolipin IgA: <9 APL U/mL (ref 0–11)
Anticardiolipin IgG: <9 GPL U/mL (ref 0–14)
Anticardiolipin IgM: <9 MPL U/mL (ref 0–12)

## 2016-02-07 LAB — BETA-2-GLYCOPROTEIN I ABS, IGG/M/A

## 2016-02-07 LAB — T3, FREE: T3, Free: 2.9 pg/mL (ref 2.0–4.4)

## 2016-02-07 NOTE — Progress Notes (Signed)
Physical Therapy Treatment Patient Details Name: Ronald HobbyKevin Nole MRN: [id number] DOB: 1961/04/22 Today's Date: 02/07/2016    History of Present Illness pt is a 55 y/o male with h/o smoking, marijuana use, admitted to ED with slurred speech/ expressive aphasia and Right sided weakness. 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    PT Comments    Pt continues to present with severe right sided weakness/deficits post CVA. More participatory with therapy today, however does present with a flat affect and possibly depressive mood at times. Acute PT to continue during pt's hospital stay working on increasing pt's functional mobility. Pt did have less right knee buckling today needing light support at the knee in stance. Continue to recommend CIR post acute as per PT recommendation on evaluation.    Follow Up Recommendations  CIR     Equipment Recommendations  Other (comment) (TBD at next venue)    Recommendations for Other Services Rehab consult     Precautions / Restrictions Precautions Precautions: Fall Restrictions Weight Bearing Restrictions: No    Mobility  Bed Mobility     Rolling: Min assist Sidelying to sit: Min assist       General bed mobility comments: bed flat. cues on sequencing and technique. cues to use left UE to assist with moving right UE. cues to remember to move his right side required along with total assist for right UE and leg mobility.  Transfers     Transfers: Sit to/from Stand Sit to Stand: Mod assist;+2 physical assistance;+2 safety/equipment         General transfer comment: cues to scoot closer to edge of bed prior to standing. sit<>stand x 2 reps with right knee blocked to prevent buckling and support provided under pt's right elbow/forearm to facilitate right UE weight bearing. worked on lateral weight shifting and midline orientaiton with 1st stand and performed stand<>pivot transfer with  second stand. 2 person assist needed with all transfers. pt attempted to step left foot toward chair with right knee blocked, was more a wiggle step however with decreased weight shifting onto his right leg.  P                                 Modified Rankin (Stroke Patients Only) Modified Rankin (Stroke Patients Only) Pre-Morbid Rankin Score: No symptoms Modified Rankin: Severe disability     Cognition Arousal/Alertness: Awake/alert Behavior During Therapy: Flat affect;WFL for tasks assessed/performed Overall Cognitive Status: Impaired/Different from baseline Area of Impairment: Memory;Safety/judgement;Problem solving     Memory: Decreased short-term memory       Problem Solving: Difficulty sequencing General Comments: pt alternating between needing cues to initiate mobility to being impulsive and needing cues to stop/slow down for safety throught out session    Exercises General Exercises - Lower Extremity Heel Slides: PROM;Right;Supine;Limitations Heel Slides Limitations: working on range and attempting to facitate musce contractions.  Other Exercises Other Exercises: passive heel cord stretching to right for 20 secs x 3 reps     Pertinent Vitals/Pain Pain Assessment: No/denies pain     PT Goals (current goals can now be found in the care plan section) Acute Rehab PT Goals Patient Stated Goal: get better, independent PT Goal Formulation: With patient Time For Goal Achievement: 02/19/16 Potential to Achieve Goals: Good Progress towards PT goals: Progressing toward goals    Frequency  Min 3X/week    PT Plan Current plan  remains appropriate    End of Session Equipment Utilized During Treatment: Gait belt Activity Tolerance: Patient tolerated treatment well Patient left: in chair;with call bell/phone within reach;with chair alarm set     Time: [id number] PT Time Calculation (min) (ACUTE ONLY): 24 min  Charges:  $Therapeutic Activity: 8-22 mins $Neuromuscular  Re-education: 8-22 mins           Sallyanne KusterBury, Kathy 02/07/2016, 3:28 PM   Sallyanne KusterKathy Bury, PTA, CLT Acute Rehab Services [phone] 02/07/2016, 3:31 PM

## 2016-02-07 NOTE — Progress Notes (Signed)
Subjective: This morning, he appears disappointed and disheartened. He would like to go home and not remain in the hospital any longer than he needs to though I explained to him that he needs to work with physical therapy to assess his needs.  Objective: Vital signs in last 24 hours: Filed Vitals:   02/06/16 1807 02/06/16 2126 02/07/16 0122 02/07/16 0518  BP: 138/75 121/71 138/66 122/72  Pulse: 63 62 61 76  Temp: 99.1 F (37.3 C) 98.9 F (37.2 C) 98 F (36.7 C) 98.5 F (36.9 C)  TempSrc: Oral Oral Oral Oral  Resp: 18 18 18 18   Height:      Weight:      SpO2: 93% 100% 96% 100%   Weight change:   Intake/Output Summary (Last 24 hours) at 02/07/16 0951 Last data filed at 02/06/16 1830  Gross per 24 hour  Intake    720 ml  Output    375 ml  Net    345 ml    General: resting in bed, no acute distress HEENT: right lateral visual field deficit Cardiac: RRR, no rubs, murmurs or gallops Pulm: clear to auscultation bilaterally, moving normal volumes of air Abd: soft, nontender, nondistended, BS present Ext: warm and well perfused, no pedal edema Neuro: answering questions appropriately, RUE 1/5, RLE 3/5, LUE and LLE 5/5. Can slightly squeeze fingers with right hand, cannot wiggle toes on right leg.  Assessment/Plan: Principal Problem:   CVA (cerebral vascular accident) (HCC) Active Problems:   Tobacco abuse   Alcohol use disorder (HCC)   Morbid obesity (HCC)   HLD (hyperlipidemia)   Diabetes mellitus type 2 in obese (HCC)  Left PCA territory and midbrain infarct: CTA Head and Neck showed a large subacute infarct of the left PCA territory from left posterior cerebral artery occlusion. MRI shows a large acute/subacute PCA territory infarct and acute left midbrain infarct. TTE with EF 55-60%, no PFO. Neurology assessed it to be an embolic cause of unknown source though TEE was reassuring. Loop recorder implantation deferred as it appeared patient did not understand the nature of  the intervention.  Patient with continued right sided weakness, fluent aphasia, and right lateral visual field deficit. -Continue ASA 325 mg -Continue Atorvastatin 40 mg daily -PT/OT/SLP eval recommending CIR. -Continue telemetry until loop recorder implantation on Monday  Elevated total protein: Resolved. On admission, AST/ALT is 47/25 with mild increased bilirubin of 1.7 and total protein elevated at >12.0. UA does show proteinuria at 100 mg/dL. Repeat CMP has normalized. HCV, HIV reassuring.  Thyroid nodule: 2 cm right thyroid lesion was seen on CTA. TSH slightly low at 0.318 with reassuring free T4 is 0.91, free T3. -Consider thyroid U/S and possible biopsy outpatient  EtOH use: Reports drinking a 6 pack nightly, denies history of withdrawal or seizures. -Discontinue CIWA protocol now that he has been at least 72 hours from last drink -Thiamine, Folic Acid, MVM  Tobacco use: Smoked 1 PPD for ~40 years.  -Smoking cessation counseling -Nicotine patch  Type 2 diabetes: A1c 6.6 which is borderline and consistent with CBGs throughout his hospital stay of mid 100s to low 200s. -Recommend lifestyle modification   Dispo: Disposition is deferred at this time, awaiting improvement of current medical problems hopefully to CIR.  The patient does not have a current PCP (No primary care provider on file.) and [facility]  The patient does have transportation limitations that hinder transportation to clinic appointments.    LOS:  3 days   Ronald Arbourushil V Skip Litke, MD 02/07/2016, 9:51 AM

## 2016-02-08 ENCOUNTER — Encounter (HOSPITAL_COMMUNITY): Payer: Self-pay | Admitting: Cardiovascular Disease

## 2016-02-08 DIAGNOSIS — I639 Cerebral infarction, unspecified: Secondary | ICD-10-CM

## 2016-02-08 DIAGNOSIS — I63432 Cerebral infarction due to embolism of left posterior cerebral artery: Secondary | ICD-10-CM | POA: Insufficient documentation

## 2016-02-08 LAB — GLUCOSE, CAPILLARY
GLUCOSE-CAPILLARY: 162 mg/dL — AB (ref 65–99)
GLUCOSE-CAPILLARY: 121 mg/dL — AB (ref 65–99)
GLUCOSE-CAPILLARY: 116 mg/dL — AB (ref 65–99)
Glucose-Capillary: 104 mg/dL — ABNORMAL HIGH (ref 65–99)

## 2016-02-08 LAB — BASIC METABOLIC PANEL
Anion gap: 9 (ref 5–15)
BUN: 13 mg/dL (ref 6–20)
CALCIUM: 9.1 mg/dL (ref 8.9–10.3)
CO2: 24 mmol/L (ref 22–32)
CREATININE: 0.83 mg/dL (ref 0.61–1.24)
Chloride: 103 mmol/L (ref 101–111)
Glucose, Bld: 139 mg/dL — ABNORMAL HIGH (ref 65–99)
Potassium: 4.1 mmol/L (ref 3.5–5.1)
SODIUM: 136 mmol/L (ref 135–145)

## 2016-02-08 LAB — CBC
HCT: 44.7 % (ref 39.0–52.0)
Hemoglobin: 15.5 g/dL (ref 13.0–17.0)
MCH: 31.9 pg (ref 26.0–34.0)
MCHC: 34.7 g/dL (ref 30.0–36.0)
MCV: 92 fL (ref 78.0–100.0)
PLATELETS: 161 10*3/uL (ref 150–400)
RBC: 4.86 MIL/uL (ref 4.22–5.81)
RDW: 12.7 % (ref 11.5–15.5)
WBC: 10.1 10*3/uL (ref 4.0–10.5)

## 2016-02-08 NOTE — Progress Notes (Signed)
Subjective: Patient awake this morning. Says he wants to know when he can go home. Understands the plan for ILR placement tomorrow with possible Inpatient Rehab afterwards. He is able to eat without issue. Appears disinterested.  Objective: Vital signs in last 24 hours: Filed Vitals:   02/07/16 2136 02/08/16 0108 02/08/16 0618 02/08/16 0844  BP: 124/74 137/82 127/75 126/84  Pulse: 66 70 63 66  Temp: 98.2 F (36.8 C) 98.3 F (36.8 C) 98.2 F (36.8 C) 99 F (37.2 C)  TempSrc: Oral Oral Oral Oral  Resp: 18 18 20 20   Height:      Weight:      SpO2: 92% 100% 96% 96%   Weight change:   Intake/Output Summary (Last 24 hours) at 02/08/16 1250 Last data filed at 02/08/16 0844  Gross per 24 hour  Intake    360 ml  Output    575 ml  Net   -215 ml    General: resting in bed, no acute distress HEENT: right lateral visual field deficit Cardiac: RRR, no rubs, murmurs or gallops Pulm: clear to auscultation bilaterally, moving normal volumes of air Abd: soft, nontender, nondistended, BS present Ext: warm and well perfused, no pedal edema Neuro: answering questions appropriately, RUE 1/5, RLE 1/5, LUE and LLE 5/5. Unable to squeeze fingers with right hand, cannot wiggle toes on right leg.  Assessment/Plan: Principal Problem:   CVA (cerebral vascular accident) (HCC) Active Problems:   Tobacco abuse   Alcohol use disorder (HCC)   Morbid obesity (HCC)   HLD (hyperlipidemia)   Diabetes mellitus type 2 in obese [facility](HCC)   Cerebrovascular accident (CVA) due to thrombosis of left posterior cerebral artery (HCC)  Left PCA territory and midbrain infarct: CTA Head and Neck showed a large subacute infarct of the left PCA territory from left posterior cerebral artery occlusion. MRI shows a large acute/subacute PCA territory infarct and acute left midbrain infarct. TTE with EF 55-60%, no PFO. Neurology assessed it to be an embolic cause of unknown source though TEE was reassuring. Loop recorder  implantation planned for tomorrow.  Patient with continued right sided weakness, fluent aphasia, and right lateral visual field deficit. -Continue ASA 325 mg -Continue Atorvastatin 40 mg daily -PT/OT/SLP eval recommending CIR. -Continue telemetry until loop recorder implantation on Monday  Elevated total protein: Resolved. On admission, AST/ALT is 47/25 with mild increased bilirubin of 1.7 and total protein elevated at >12.0. UA does show proteinuria at 100 mg/dL. Repeat CMP has normalized. HCV, HIV reassuring.  Thyroid nodule: 2 cm right thyroid lesion was seen on CTA. TSH slightly low at 0.318 with reassuring free T4 0.91, free T3 2.9. -Consider thyroid U/S and possible biopsy outpatient  EtOH use: Reports drinking a 6 pack nightly, denies history of withdrawal or seizures. -Discontinued CIWA protocol now that he has been at least 72 hours from last drink -Thiamine, Folic Acid, MVM  Tobacco use: Smoked 1 PPD for ~40 years.  -Smoking cessation counseling -Nicotine patch  Type 2 diabetes: A1c 6.6 which is borderline and consistent with CBGs throughout his hospital stay of mid 100s to low 200s. -Recommend lifestyle modification   Dispo: Disposition is deferred at this time, awaiting improvement of current medical problems hopefully to CIR.  The patient does not have a current PCP (No primary care provider on file.) and [facility] after [facility].  The patient does have transportation limitations that hinder transportation to clinic appointments.    LOS: 4 days  Darreld McleanVishal Londan Coplen, MD 02/08/2016, 12:50 PM

## 2016-02-08 NOTE — Progress Notes (Signed)
Patient ID: Arcangel Sultan, male   DOB: 04/29/1961, 55 y.o.   MRN: [id number]    Patient Name: Franke Litzinger Date of Encounter: 02/08/2016     Principal Problem:   CVA (cerebral vascular accident) (HCC) Active Problems:   Tobacco abuse   Alcohol use disorder (HCC)   Morbid obesity (HCC)   HLD (hyperlipidemia)   Diabetes mellitus type 2 in obese (HCC)    SUBJECTIVE  No chest pain or sob. Wants to go home.  CURRENT MEDS . aspirin  300 mg Rectal Daily   Or  . aspirin  325 mg Oral Daily  . atorvastatin  40 mg Oral q1800  . enoxaparin (LOVENOX) injection  0.5 mg/kg Subcutaneous Q24H  . nicotine  21 mg Transdermal Daily  . thiamine  100 mg Oral Daily   Or  . thiamine  100 mg Intravenous Daily    OBJECTIVE  Filed Vitals:   02/07/16 1845 02/07/16 2136 02/08/16 0108 02/08/16 0618  BP: 121/70 124/74 137/82 127/75  Pulse: 75 66 70 63  Temp: 99.1 F (37.3 C) 98.2 F (36.8 C) 98.3 F (36.8 C) 98.2 F (36.8 C)  TempSrc: Oral Oral Oral Oral  Resp: 18 18 18 20  Height:      Weight:      SpO2: 96% 92% 100% 96%   No intake or output data in the 24 hours ending 02/08/16 0827 Filed Weights   02/04/16 0849 02/04/16 1132 02/06/16 0825  Weight: 280 lb (127.007 kg) 269 lb 6.4 oz (122.2 kg) 269 lb 6.4 oz (122.2 kg)    PHYSICAL EXAM  General: Pleasant, NAD. Neuro: Alert and oriented X 3. Dense right HP, expressive aphasia Psych: blunted affect. HEENT:  Normal  Neck: Supple without bruits or JVD. Lungs:  Resp regular and unlabored, CTA. Heart: RRR no s3, s4, or murmurs. Abdomen: Soft, non-tender, non-distended, BS + x 4.  Extremities: No clubbing, cyanosis or edema. DP/PT/Radials 2+ and equal bilaterally.  Accessory Clinical Findings  CBC  Recent Labs  02/08/16 0535  WBC 10.1  HGB 15.5  HCT 44.7  MCV 92.0  PLT 161   Basic Metabolic Panel  Recent Labs  02/08/16 0535  NA 136  K 4.1  CL 103  CO2 24  GLUCOSE 139*  BUN 13  CREATININE 0.83  CALCIUM 9.1    Liver Function Tests No results for input(s): AST, ALT, ALKPHOS, BILITOT, PROT, ALBUMIN in the last 72 hours. No results for input(s): LIPASE, AMYLASE in the last 72 hours. Cardiac Enzymes No results for input(s): CKTOTAL, CKMB, CKMBINDEX, TROPONINI in the last 72 hours. BNP Invalid input(s): POCBNP D-Dimer No results for input(s): DDIMER in the last 72 hours. Hemoglobin A1C No results for input(s): HGBA1C in the last 72 hours. Fasting Lipid Panel No results for input(s): CHOL, HDL, LDLCALC, TRIG, CHOLHDL, LDLDIRECT in the last 72 hours. Thyroid Function Tests  Recent Labs  02/06/16 0635  T3FREE 2.9    TELE  nsr  Radiology/Studies  Ct Angio Head W Or Wo Contrast  02/04/2016  CLINICAL DATA:  Stroke. Right-sided weakness 2 weeks. Slurred speech. EXAM: CT ANGIOGRAPHY HEAD AND NECK TECHNIQUE: Multidetector CT imaging of the head and neck was performed using the standard protocol during bolus administration of intravenous contrast. Multiplanar CT image reconstructions and MIPs were obtained to evaluate the vascular anatomy. Carotid stenosis measurements (when applicable) are obtained utilizing NASCET criteria, using the distal internal carotid diameter as the denominator. CONTRAST:  50 mL Isovue 370 IV COMPARISON:  CT 02/04/2016   FINDINGS: CTA NECK Aortic arch: Normal aortic arch. Lung apices clear. Proximal great vessels widely patent. Right carotid system: Mild atherosclerotic disease at the right carotid bifurcation without stenosis. No dissection Left carotid system: Mild atherosclerotic disease at the bifurcation without stenosis or dissection. Vertebral arteries:Right vertebral artery dominant. Right vertebral artery widely patent to the basilar. Left vertebral artery is small and ends in PICA without significant contribution to the basilar. Skeleton: Cervical degenerative change.  No fracture or bony lesion. Other neck: Right thyroid lesion measuring 20 mm. This has irregular margins  and is noncalcified. Thyroid ultrasound and possible biopsy recommended to rule out neoplasm. CTA HEAD Anterior circulation: Mild atherosclerotic calcified gauge in the cavernous carotid bilaterally without significant stenosis. Anterior and middle cerebral arteries widely patent bilaterally without stenosis. Posterior circulation: Right vertebral artery patent to the basilar. Right PICA patent. Left vertebral artery hypoplastic ending in PICA without significant contribution to the basilar. Basilar widely patent. Superior cerebellar artery patent bilaterally. Posterior communicating artery patent bilaterally. Right posterior cerebral artery widely patent Left posterior cerebral artery occluded at the origin with possible faint reconstitution distally. Large subacute infarct left PCA territory. Venous sinuses: Patent Anatomic variants: negative for cerebral aneurysm Delayed phase: Slight enhancement along the periphery of the left PCA infarct compatible with subacute infarction. No enhancing mass lesion. IMPRESSION: Large subacute infarct left PCA territory Left posterior cerebral artery is occluded with faint reconstitution in the midportion. No significant carotid or vertebral artery stenosis in the neck. 2 cm right thyroid lesion. Recommend thyroid ultrasound and biopsy to rule out neoplasm. Electronically Signed   By: Charles  Clark M.D.   On: 02/04/2016 10:34   Ct Head Wo Contrast  02/04/2016  CLINICAL DATA:  Code stroke earlier today. Right-sided weakness for 2 weeks. Left PCA infarct. EXAM: CT HEAD WITHOUT CONTRAST TECHNIQUE: Contiguous axial images were obtained from the base of the skull through the vertex without intravenous contrast. COMPARISON:  CTA earlier same date. FINDINGS: 1708 hours. Brain: Large subacute infarct in the left posterior cerebral artery distribution is again noted, involving the medial left temporal, parietal and occipital lobes. This has not significantly changed compared with the  prior examination. There are chronic small vessel ischemic changes in the left basal ganglia. There is no midline shift, hydrocephalus or evidence of acute intracranial hemorrhage. Intracranial vascular calcifications are noted. Bones/sinuses/visualized face: The visualized paranasal sinuses, mastoid air cells and middle ears are clear. Cold lamina papyracea deformity on the right is noted. The calvarium is intact. IMPRESSION: 1. Stable large subacute left PCA distribution infarct from examination earlier today. 2. No new findings. Electronically Signed   By: William  Veazey M.D.   On: 02/04/2016 17:39   Ct Head Wo Contrast  02/04/2016  ADDENDUM REPORT: 02/04/2016 09:49 ADDENDUM: Findings discussed with Dr. Kirkpatrick on 02/04/2016 at 0945 hours. Electronically Signed   By: Mark  Boles M.D.   On: 02/04/2016 09:49  02/04/2016  CLINICAL DATA:  Slurred speech, stroke symptoms, onset 2 weeks ago but worse this morning EXAM: CT HEAD WITHOUT CONTRAST TECHNIQUE: Contiguous axial images were obtained from the base of the skull through the vertex without intravenous contrast. COMPARISON:  None FINDINGS: Normal ventricular morphology. No midline shift. Large area of low attenuation involving the LEFT occipital and posterior parietal regions compatible with a LEFT PCA territory infarct. This extends into the posterior LEFT temporal lobe. Mild mass effect upon the atrium of the LEFT lateral ventricle. Old lacunar infarct anterior LEFT basal ganglia into anterior limb LEFT internal   capsule. No intracranial hemorrhage or mass lesion. Posterior fossa unremarkable. No extra-axial fluid collections. Bones and sinuses unremarkable. IMPRESSION: Large area of cortical infarction involving the posterior LEFT parietal and LEFT occipital lobes extending into posterior LEFT temporal lobe, probably subacute in age. Old appearing lacunar infarct anterior LEFT basal ganglia into the anterior limb of LEFT internal capsule. No midline  shift or intracranial hemorrhage identified. Electronically Signed: By: Mark  Boles M.D. On: 02/04/2016 09:32   Ct Angio Neck W Or Wo Contrast  02/04/2016  CLINICAL DATA:  Stroke. Right-sided weakness 2 weeks. Slurred speech. EXAM: CT ANGIOGRAPHY HEAD AND NECK TECHNIQUE: Multidetector CT imaging of the head and neck was performed using the standard protocol during bolus administration of intravenous contrast. Multiplanar CT image reconstructions and MIPs were obtained to evaluate the vascular anatomy. Carotid stenosis measurements (when applicable) are obtained utilizing NASCET criteria, using the distal internal carotid diameter as the denominator. CONTRAST:  50 mL Isovue 370 IV COMPARISON:  CT 02/04/2016 FINDINGS: CTA NECK Aortic arch: Normal aortic arch. Lung apices clear. Proximal great vessels widely patent. Right carotid system: Mild atherosclerotic disease at the right carotid bifurcation without stenosis. No dissection Left carotid system: Mild atherosclerotic disease at the bifurcation without stenosis or dissection. Vertebral arteries:Right vertebral artery dominant. Right vertebral artery widely patent to the basilar. Left vertebral artery is small and ends in PICA without significant contribution to the basilar. Skeleton: Cervical degenerative change.  No fracture or bony lesion. Other neck: Right thyroid lesion measuring 20 mm. This has irregular margins and is noncalcified. Thyroid ultrasound and possible biopsy recommended to rule out neoplasm. CTA HEAD Anterior circulation: Mild atherosclerotic calcified gauge in the cavernous carotid bilaterally without significant stenosis. Anterior and middle cerebral arteries widely patent bilaterally without stenosis. Posterior circulation: Right vertebral artery patent to the basilar. Right PICA patent. Left vertebral artery hypoplastic ending in PICA without significant contribution to the basilar. Basilar widely patent. Superior cerebellar artery patent  bilaterally. Posterior communicating artery patent bilaterally. Right posterior cerebral artery widely patent Left posterior cerebral artery occluded at the origin with possible faint reconstitution distally. Large subacute infarct left PCA territory. Venous sinuses: Patent Anatomic variants: negative for cerebral aneurysm Delayed phase: Slight enhancement along the periphery of the left PCA infarct compatible with subacute infarction. No enhancing mass lesion. IMPRESSION: Large subacute infarct left PCA territory Left posterior cerebral artery is occluded with faint reconstitution in the midportion. No significant carotid or vertebral artery stenosis in the neck. 2 cm right thyroid lesion. Recommend thyroid ultrasound and biopsy to rule out neoplasm. Electronically Signed   By: Charles  Clark M.D.   On: 02/04/2016 10:34   Mr Brain Wo Contrast  02/04/2016  CLINICAL DATA:  RIGHT-sided weakness for 2 weeks.  Recent worsening EXAM: MRI HEAD WITHOUT CONTRAST TECHNIQUE: Multiplanar, multiecho pulse sequences of the brain and surrounding structures were obtained without intravenous contrast. COMPARISON:  Multiple prior CT studies earlier today. FINDINGS: The patient was unable to remain motionless for the exam. Small or subtle lesions could be overlooked. Marked restricted diffusion in the areas of CT demonstrated to have cytotoxic edema. This is consistent with a acute/subacute LEFT PCA territory infarct affecting the posterior temporal lobe, medial occipital lobe, and much of the thalamus. Restricted diffusion is noted to additionally involve the LEFT midbrain, specifically the LEFT cerebral peduncle, (see images 24 and 25, series 4) and likely accounts for the RIGHT hemiparesis. Early T2 shortening, and T1 prolongation, reflect gyriform type reperfusion hemorrhage. There is no significant confluent   lobar hematoma or measurable volume of blood. Absent flow void LEFT PCA as predicted from CTA. Normal for age cerebral  volume. Mild to moderate T2 and FLAIR hyperintensities throughout the white matter, premature for age, representing chronic microvascular ischemic change. There are at least two chronic hemorrhagic lacunar infarcts in the LEFT hemisphere, affecting the caudate as well as the periventricular white matter. Unremarkable midline structures. Negative orbits. Chronic medial blowout fracture, RIGHT orbit. IMPRESSION: Large acute/subacute LEFT PCA territory infarct. Early hemorrhagic transformation without lobar hematoma. Additional acute infarction of the LEFT midbrain likely accounts for the reported RIGHT hemiparesis. Chronic microvascular ischemic change with areas of chronic lacunar hemorrhagic insult as described. Electronically Signed   By: John T Curnes M.D.   On: 02/04/2016 20:17    ASSESSMENT AND PLAN  1. Stroke - he has residual speech and right HP. He is able to give consent at this point. Will plan ILR insertion tomorrow. 2. HTN - note need for better long term blood pressure control. He is currently stable.   Gregg Taylor,M.D.  02/08/2016 8:27 AM 

## 2016-02-09 ENCOUNTER — Encounter (HOSPITAL_COMMUNITY)
Admission: EM | Disposition: A | Discharge status: Skilled Nursing Facility | Payer: Self-pay | Source: Home / Self Care | Attending: Internal Medicine

## 2016-02-09 ENCOUNTER — Encounter (HOSPITAL_COMMUNITY): Payer: Self-pay | Admitting: Internal Medicine

## 2016-02-09 DIAGNOSIS — E119 Type 2 diabetes mellitus without complications: Secondary | ICD-10-CM

## 2016-02-09 DIAGNOSIS — I639 Cerebral infarction, unspecified: Secondary | ICD-10-CM

## 2016-02-09 HISTORY — PX: EP IMPLANTABLE DEVICE: SHX172B

## 2016-02-09 LAB — GLUCOSE, CAPILLARY
GLUCOSE-CAPILLARY: 127 mg/dL — AB (ref 65–99)
GLUCOSE-CAPILLARY: 153 mg/dL — AB (ref 65–99)
Glucose-Capillary: 135 mg/dL — ABNORMAL HIGH (ref 65–99)
Glucose-Capillary: 136 mg/dL — ABNORMAL HIGH (ref 65–99)

## 2016-02-09 SURGERY — LOOP RECORDER INSERTION
Anesthesia: LOCAL

## 2016-02-09 MED ORDER — SODIUM CHLORIDE 0.9 % IV SOLN
INTRAVENOUS | Status: AC
  Administered 2016-02-09: 09:00:00 via INTRAVENOUS

## 2016-02-09 MED ORDER — LIDOCAINE-EPINEPHRINE 1 %-1:100000 IJ SOLN
INTRAMUSCULAR | Status: AC
  Filled 2016-02-09: qty 1

## 2016-02-09 MED ORDER — LIDOCAINE-EPINEPHRINE 1 %-1:100000 IJ SOLN
INTRAMUSCULAR | Status: DC | PRN
  Administered 2016-02-09: 10 mL via INTRADERMAL

## 2016-02-09 SURGICAL SUPPLY — 2 items
LOOP REVEAL LINQSYS (Prosthesis & Implant Heart) ×2 IMPLANT
PACK LOOP INSERTION (CUSTOM PROCEDURE TRAY) ×2 IMPLANT

## 2016-02-09 NOTE — Progress Notes (Signed)
Chaplain met with the patient's sister who wanted to assist the patient with completing a Advance Directive. Chaplain spoke with the patient's Nurse to ensure that the patient who had experienced a stoke was cogitative enough to make decisions for an Advance Directive, the Nurse confirmed that he was alert to do so.  The sister in law completed parts A and B of the form with the patient's consent, part C will be completed by the patient. Prior to completing the form the patient was taken to the Cath Lab for a prodecure, Chaplain will follow up with the patient at his return from the Cath Lab. Chaplain Yaakov Guthrie [phone]

## 2016-02-09 NOTE — Progress Notes (Addendum)
Physical Therapy Treatment Patient Details Name: Ronald Forbes MRN: [id number] DOB: 1961/05/24 Today's Date: 02/09/2016    History of Present Illness pt is a 55 y/o male with h/o smoking, marijuana use, admitted to ED with slurred speech/ expressive aphasia and Right sided weakness. 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    PT Comments    Progressing as expected.  Needing moderate cues for safe technique and sequencing due to no ST memory. Using "good" side well to stand and able to follow commands well to work on w/shifting and control of R knee.  Great CIR candidate.   Follow Up Recommendations  CIR     Equipment Recommendations  Other (comment)    Recommendations for Other Services Rehab consult     Precautions / Restrictions Precautions Precautions: Fall      Mobility  Bed Mobility Overal bed mobility: Needs Assistance Bed Mobility: Sit to Supine       Sit to supine: Min assist   General bed mobility comments: cues for sequencing. full assist for R LE and help positioning and scooting to [facility]  Transfers Overall transfer level: Needs assistance Equipment used: Straight cane Transfers: Sit to/from [employer]Stand;Stand Pivot Transfers Sit to Stand: Mod assist;+2 safety/equipment Stand pivot transfers: Mod assist;+2 physical assistance       General transfer comment: mod cues for technique and sequencing  Ambulation/Gait             General Gait Details: unable   Stairs            Wheelchair Mobility    Modified Rankin (Stroke Patients Only) Modified Rankin (Stroke Patients Only) Pre-Morbid Rankin Score: No symptoms Modified Rankin: Severe disability     Balance Overall balance assessment: Needs assistance Sitting-balance support: No upper extremity supported Sitting balance-Leahy Scale: Fair Sitting balance - Comments: can sit at edge of chair without UE assist , but w/shift and scooting  unccordinated     Standing balance-Leahy Scale: Poor Standing balance comment: 3 standing trials each 3-5 minutes working on balance, w/shift, stepping with L LE while on R LE, w/bearing through R UE                    Cognition Arousal/Alertness: Awake/alert Behavior During Therapy: Flat affect;WFL for tasks assessed/performed Overall Cognitive Status: Impaired/Different from baseline Area of Impairment: Memory;Safety/judgement;Problem solving     Memory: Decreased short-term memory   Safety/Judgement: Decreased awareness of deficits   Problem Solving: Difficulty sequencing      Exercises General Exercises - Lower Extremity Long Arc Quad: AROM;AAROM;Both;10 reps;Seated    General Comments General comments (skin integrity, edema, etc.): As pt started complain of dizziness, BP's checeked 128/81 and standing  97/64.      Pertinent Vitals/Pain Pain Assessment: Faces Faces Pain Scale: Hurts little more Pain Location: R side Pain Descriptors / Indicators: Aching Pain Intervention(s): Monitored during session;Repositioned    Home Living                      Prior Function            PT Goals (current goals can now be found in the care plan section) Acute Rehab PT Goals Patient Stated Goal: get better, independent PT Goal Formulation: With patient Time For Goal Achievement: 02/19/16 Potential to Achieve Goals: Good Progress towards PT goals: Progressing toward goals    Frequency  Min 3X/week    PT Plan Current plan remains  appropriate    Co-evaluation PT/OT/SLP Co-Evaluation/Treatment: Yes Reason for Co-Treatment: Complexity of the patient's impairments (multi-system involvement);For patient/therapist safety PT goals addressed during session: Mobility/safety with mobility       End of Session   Activity Tolerance: Patient tolerated treatment well;Other (comment) (limited by dizziness) Patient left: in bed;with nursing/sitter in room      Time: [id number] PT Time Calculation (min) (ACUTE ONLY): 45 min  Charges:  $Therapeutic Activity: 8-22 mins $Neuromuscular Re-education: 8-22 mins                    G Codes:      Ronald Forbes, [name] 02/09/2016, 12:03 PM 02/09/2016  Amesti BingKen Marceline Forbes, PT [phone]  (pager)

## 2016-02-09 NOTE — Progress Notes (Signed)
Patient refused most of his medication today along with his lovenox injection. RN explained the important of getting his meds, Pt verbalizes that he is upset at this situation. Emotional support give.   Sim BoastHavy, RN

## 2016-02-09 NOTE — Progress Notes (Signed)
I met with pt at bedside and then contacted his brother, Luvenia Heller, by phone with his permission to discuss rehab plans. Guy plans for pt to eventually come to live with him and his wife once pt has received some rehab. Inpt rehab bed is not available today. I have alerted SW for the need for SNF rehab backup in case inpt rehab bed not available when pt ready for d/c. I will follow up tomorrow. [phone]

## 2016-02-09 NOTE — NC FL2 (Signed)
[place] MEDICAID FL2 LEVEL OF CARE SCREENING TOOL     IDENTIFICATION  Patient Name: Ronald Forbes Birthdate: 1961/02/13 Sex: male Admission Date (Current Location): 02/04/2016  [facility] [place]   Facility and Address:  [facility], [address], [place], [place] [address]      Provider Number: [id number]  Attending Physician Name and Address:  Earl LagosNischal Narendra, MD  Relative Name and Phone Number:       Current Level of Care: Hospital Recommended Level of Care: Skilled Nursing Facility Prior Approval Number:    Date Approved/Denied:   PASRR Number:   [id number]   Discharge Plan: SNF    Current Diagnoses: Patient Active Problem List   Diagnosis Date Noted  . 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 Diabetes mellitus type 2 in obese (HCC) 02/07/2016  . HLD (hyperlipidemia)   . Tobacco abuse 02/04/2016  . Alcohol use disorder (HCC) 02/04/2016  . Morbid obesity (HCC) 02/04/2016  . CVA (cerebral vascular accident) (HCC) 02/04/2016    Orientation RESPIRATION BLADDER Height & Weight     Self, Time, Situation, Place  Normal Continent Weight: 269 lb 6.4 oz (122.2 kg) Height:  6\' 3"  (190.5 cm)  BEHAVIORAL SYMPTOMS/MOOD NEUROLOGICAL BOWEL NUTRITION STATUS   (NONE )  (NONE ) Continent Diet (HEART HEALTHY/CARB MODIFIED )  AMBULATORY STATUS COMMUNICATION OF NEEDS Skin   Extensive Assist Verbally Normal                       Personal Care Assistance Level of Assistance  Bathing, Feeding, Dressing Bathing Assistance: Maximum assistance Feeding assistance: Limited assistance Dressing Assistance: Maximum assistance     Functional Limitations Info  Speech, Hearing, Sight Sight Info: Impaired Hearing Info: Adequate Speech Info: Impaired    SPECIAL CARE FACTORS FREQUENCY  PT (By licensed PT), OT (By licensed OT)     PT Frequency: 3 OT Frequency: 2             Contractures      Additional Factors Info  Code Status, Allergies Code Status Info: FULL CODE  Allergies Info: N/A           Current Medications (02/09/2016):  This is the current hospital active medication list Current Facility-Administered Medications  Medication Dose Route Frequency Provider Last Rate Last Dose  . aspirin suppository 300 mg  300 mg Rectal Daily Darreld McleanVishal Patel, MD       Or  . aspirin tablet 325 mg  325 mg Oral Daily Darreld McleanVishal Patel, MD   325 mg at 02/09/16 0831  . atorvastatin (LIPITOR) tablet 40 mg  40 mg Oral q1800 Beather Arbourushil V Patel, MD   40 mg at 02/08/16 1724  . enoxaparin (LOVENOX) injection 60 mg  0.5 mg/kg Subcutaneous Q24H Nischal Narendra, MD   60 mg at 02/08/16 1724  . ibuprofen (ADVIL,MOTRIN) tablet 600 mg  600 mg Oral Q6H PRN Darreld McleanVishal Patel, MD   600 mg at 02/06/16 2003  . nicotine (NICODERM CQ - dosed in mg/24 hours) patch 21 mg  21 mg Transdermal Daily Darreld McleanVishal Patel, MD   21 mg at 02/09/16 [id number]  . senna-docusate (Senokot-S) tablet 1 tablet  1 tablet Oral QHS PRN Darreld McleanVishal Patel, MD      . thiamine (VITAMIN B-1) tablet 100 mg  100 mg Oral Daily Beather Arbourushil V Patel, MD   100 mg at 02/08/16 0949   Or  . thiamine (B-1)  injection 100 mg  100 mg Intravenous Daily Beather Arbourushil V Patel, MD         Discharge Medications: Please see discharge summary for a list of discharge medications.  Relevant Imaging Results:  Relevant Lab Results:   Additional Information SSN [id number]  Derenda FennelBashira Vonzell Lindblad, MSW, LCSWA [phone] 02/09/2016 4:05 PM

## 2016-02-09 NOTE — Care Management Note (Signed)
Case Management Note  Patient Details  Name: Laverle HobbyKevin Barno MRN: [id number] Date of Birth: 08-17-1960  Subjective/Objective:                    Action/Plan: Plan is CIR vs SNF. CM following for discharge needs.  Expected Discharge Date:   (Pending)               Expected Discharge Plan:     In-House Referral:     Discharge planning Services     Post Acute Care Choice:    Choice offered to:     DME Arranged:    DME Agency:     HH Arranged:    HH Agency:     Status of Service:     If discussed at [employer] Length of Stay Meetings, dates discussed:    Additional Comments:  Kermit BaloKelli F Laniqua Torrens, RN 02/09/2016, 2:16 PM

## 2016-02-09 NOTE — Progress Notes (Signed)
Subjective: Patient awake this morning. He thinks he has some improvement in his fluent aphasia, however right sided weakness and visual field deficit is unchanged. Sister-in-law was present during rounds. We explained to patient and family the plan for ILR today and rehab when a bed is available with CIR.   Objective: Vital signs in last 24 hours: Filed Vitals:   02/08/16 2119 02/09/16 0044 02/09/16 0632 02/09/16 0950  BP: 133/72 105/59 101/50 142/70  Pulse: 65 69 65 72  Temp: 98.6 F (37 C) 97.6 F (36.4 C) 97.7 F (36.5 C) 98.5 F (36.9 C)  TempSrc: Oral Oral Oral Oral  Resp: 20 20 20 19   Height:      Weight:      SpO2: 96% 94% 95% 98%   Weight change:   Intake/Output Summary (Last 24 hours) at 02/09/16 1136 Last data filed at 02/08/16 2120  Gross per 24 hour  Intake      0 ml  Output    500 ml  Net   -500 ml    General: resting in bed, no acute distress HEENT: right lateral visual field deficit Cardiac: RRR, no rubs, murmurs or gallops Pulm: clear to auscultation anteriorly, moving normal volumes of air Abd: soft, nontender, nondistended, BS present Ext: warm and well perfused, no pedal edema Neuro: answering questions appropriately, RUE 1/5, RLE 1/5, LUE and LLE 5/5. Unable to squeeze fingers with right hand, cannot wiggle toes on right leg. Can raise RLE slightly off bed against gravity.  Assessment/Plan: Principal Problem:   CVA (cerebral vascular accident) (HCC) Active Problems:   Tobacco abuse   Alcohol use disorder (HCC)   Morbid obesity (HCC)   HLD (hyperlipidemia)   Diabetes mellitus type 2 in obese [facility](HCC)   Cerebrovascular accident (CVA) due to thrombosis of left posterior cerebral artery (HCC)  Left PCA territory and midbrain infarct: CTA Head and Neck showed a large subacute infarct of the left PCA territory from left posterior cerebral artery occlusion. MRI shows a large acute/subacute PCA territory infarct and acute left midbrain infarct. TTE with  EF 55-60%, no PFO. Neurology assessed it to be an embolic cause of unknown source though TEE was reassuring. Loop recorder implantation planned for today.  -Continue ASA 325 mg -Continue Atorvastatin 40 mg daily -PT/OT/SLP eval recommending CIR. -Continue telemetry until loop recorder implantation on Monday  Elevated total protein: Resolved. On admission, AST/ALT is 47/25 with mild increased bilirubin of 1.7 and total protein elevated at >12.0. UA does show proteinuria at 100 mg/dL. Repeat CMP has normalized. HCV, HIV reassuring.  Thyroid nodule: 2 cm right thyroid lesion was seen on CTA. TSH slightly low at 0.318 with reassuring free T4 0.91, free T3 2.9. -Consider thyroid U/S and possible biopsy outpatient  EtOH use: Reports drinking a 6 pack nightly, denies history of withdrawal or seizures. -Discontinued CIWA protocol now that he has been at least 72 hours from last drink -Thiamine, Folic Acid, MVM  Tobacco use: Smoked 1 PPD for ~40 years.  -Smoking cessation counseling -Nicotine patch  Type 2 diabetes: A1c 6.6 which is borderline and consistent with CBGs throughout his hospital stay of mid 100s to low 200s. -Recommend lifestyle modification   Dispo: Disposition is deferred at this time, awaiting improvement of current medical problems hopefully to CIR.  The patient does not have a current PCP (No primary care provider on file.) and [facility] after discharge.  The patient does have transportation limitations that hinder transportation  to clinic appointments.    LOS: 5 days   Darreld McleanVishal Patel, MD 02/09/2016, 11:36 AM

## 2016-02-09 NOTE — Progress Notes (Signed)
Chaplain returned to the patient's room to complete his Advance Directive.  It was noted by the Chaplain that the patient is sleeping and makes a decision that the patient is not alert at time to complete the Advance Directive, chaplain will follow up on tomorrow for completion. The sister in law who the Chaplain assisted this morning was called and informed of the same., in addition to his assigned Nurse. Chaplain Janell QuietAudrey Starlena Beil [phone]

## 2016-02-09 NOTE — Progress Notes (Signed)
Patient ID: Ronald HobbyKevin Liebert, male   DOB: Feb 28, 1961, 55 y.o.   MRN: [id number]    Patient Name: Ronald HobbyKevin Seda Date of Encounter: 02/09/2016     Principal Problem:   CVA (cerebral vascular accident) [facility](HCC) Active Problems:   Tobacco abuse   Alcohol use disorder (HCC)   Morbid obesity (HCC)   HLD (hyperlipidemia)   Diabetes mellitus type 2 in obese [facility](HCC)   Cerebrovascular accident (CVA) due to thrombosis of left posterior cerebral artery (HCC)    SUBJECTIVE No complaints, asks about rehab and therapy getting started  CURRENT MEDS . aspirin  300 mg Rectal Daily   Or  . aspirin  325 mg Oral Daily  . atorvastatin  40 mg Oral q1800  . enoxaparin (LOVENOX) injection  0.5 mg/kg Subcutaneous Q24H  . nicotine  21 mg Transdermal Daily  . thiamine  100 mg Oral Daily   Or  . thiamine  100 mg Intravenous Daily    OBJECTIVE  Filed Vitals:   02/08/16 1700 02/08/16 2119 02/09/16 0044 02/09/16 0632  BP: 130/83 133/72 105/59 101/50  Pulse: 60 65 69 65  Temp: 98.6 F (37 C) 98.6 F (37 C) 97.6 F (36.4 C) 97.7 F (36.5 C)  TempSrc: Oral Oral Oral Oral  Resp: 18 20 20 20   Height:      Weight:      SpO2: 100% 96% 94% 95%    Intake/Output Summary (Last 24 hours) at 02/09/16 0843 Last data filed at 02/08/16 2120  Gross per 24 hour  Intake    360 ml  Output   1075 ml  Net   -715 ml   Filed Weights   02/04/16 0849 02/04/16 1132 02/06/16 0825  Weight: 280 lb (127.007 kg) 269 lb 6.4 oz (122.2 kg) 269 lb 6.4 oz (122.2 kg)    PHYSICAL EXAM  General: Pleasant, NAD. Neuro: Alert and oriented X 3. Dense right HP, expressive aphasia, though appears better then his previous notes describe Psych: blunted affect. HEENT:  Normal  Neck: Supple without bruits or JVD. Lungs:  Resp regular and unlabored, CTA. Heart: RRR no s3, s4, or murmurs. Abdomen: Soft, non-tender, non-distended Extremities: No clubbing, cyanosis or edema   CBC  Recent Labs  02/08/16 0535  WBC 10.1  HGB 15.5    HCT 44.7  MCV 92.0  PLT 161   Basic Metabolic Panel  Recent Labs  02/08/16 0535  NA 136  K 4.1  CL 103  CO2 24  GLUCOSE 139*  BUN 13  CREATININE 0.83  CALCIUM 9.1    TELE: no longer on telemetry, prior note state had been SR All available EKGs reviewed are all SR      ASSESSMENT AND PLAN  1. Stroke , he has residual speech and right HP. For ILR insertion today.     TEE negative for source of emboli 02/06/16      Risk/benefit re-discussed today with the patient of ILR, monitoring and follow, up, wound care as well.  The patient remains agreeable to pursue the ILR.  2. HTN      Stable  Continue care with neuro/primary care team   Francis Dowseenee Ursuy, PA-C  02/09/2016 8:43 AM

## 2016-02-09 NOTE — Progress Notes (Signed)
Offer to ambualte pt in chair this AM but pt refused.

## 2016-02-09 NOTE — Progress Notes (Signed)
Occupational Therapy Treatment Patient Details Name: Ronald HobbyKevin Bibby MRN: [id number] DOB: [birth year] Today's Date: 02/09/2016    History of present illness pt is a 55 y/o male with h/o smoking, marijuana use, admitted to ED with slurred speech/ expressive aphasia and Right sided weakness. 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   OT comments  Pt making progress toward OT goals this session. Pt tolerated weight bearing through RUE and PROM exercise to RUE, continues to demo no active motion in RUE; pt c/o R side feeling "heavy" and "sore" today. Pt requiring min assist for grooming activities in sitting. Educated pt on management of RUE and visual compensatory strategies; pt unable to recall information minutes later. Pt c/o dizziness with second attempt at sit to stand; BP in sitting 128/81, standing 97/64. D/c plan remains appropriate. Will continue to follow acutely.   Follow Up Recommendations  CIR;Supervision/Assistance - 24 hour    Equipment Recommendations  Other (comment) (TBD at next venue)    Recommendations for Other Services      Precautions / Restrictions Precautions Precautions: Fall Restrictions Weight Bearing Restrictions: No       Mobility Bed Mobility Overal bed mobility: Needs Assistance Bed Mobility: Sit to Supine       Sit to supine: Min assist   General bed mobility comments: cues for sequencing. full assist for R LE and help positioning and scooting to [facility]  Transfers Overall transfer level: Needs assistance Equipment used: Straight cane Transfers: Sit to/from [employer]Stand;Stand Pivot Transfers Sit to Stand: Mod assist;+2 safety/equipment Stand pivot transfers: Mod assist;+2 physical assistance       General transfer comment: mod cues for technique and sequencing    Balance Overall balance assessment: Needs assistance Sitting-balance support: No upper extremity supported Sitting balance-Leahy Scale:  Fair Sitting balance - Comments: can sit at edge of chair without UE assist , but w/shift and scooting unccordinated     Standing balance-Leahy Scale: Poor Standing balance comment: 3 standing trials each 3-5 minutes working on balance, w/shift, stepping with L LE while on R LE, w/bearing through R UE                   ADL Overall ADL's : Needs assistance/impaired     Grooming: Minimal assistance;Sitting;Oral care Grooming Details (indicate cue type and reason): Assist for applying toothpaste to toothbrush.                 Toilet Transfer: Moderate assistance;+2 for physical assistance;Stand-pivot;BSC Toilet Transfer Details (indicate cue type and reason): simulated by transfer from chair to EOB         Functional mobility during ADLs: Moderate assistance;+2 for physical assistance (for stand pivot only) General ADL Comments: Educated pt on proper positioning of RUE, attempting to use RUE, providing sensory input to RUE, visual compensatory strategies; pt unable to teach back to therapist later in session. Pt c/o dizziness with second trial at sit to stand; BP in sitting 128/81 in standing 97/64.      Vision                     Perception     Praxis      Cognition   Behavior During Therapy: Flat affect;WFL for tasks assessed/performed Overall Cognitive Status: Impaired/Different from baseline Area of Impairment: Memory;Safety/judgement;Problem solving     Memory: Decreased short-term memory    Safety/Judgement: Decreased awareness of deficits   Problem Solving: Difficulty sequencing  Extremity/Trunk Assessment               Exercises  Other Exercises Other Exercises: PROM shoulder flex/ext, elbow flex/ext, digit flex/ext x 5 each. Other Exercises: Pt tolerated weight bearing through RUE x 3 in standing during weight shift exercises.   Shoulder Instructions       General Comments      Pertinent Vitals/ Pain       Pain  Assessment: Faces Faces Pain Scale: Hurts little more Pain Location: R side Pain Descriptors / Indicators: Aching;Heaviness Pain Intervention(s): Monitored during session;Repositioned  Home Living                                          Prior Functioning/Environment              Frequency Min 3X/week     Progress Toward Goals  OT Goals(current goals can now be found in the care plan section)  Progress towards OT goals: Progressing toward goals  Acute Rehab OT Goals Patient Stated Goal: get better, independent  Plan Discharge plan remains appropriate    Co-evaluation    PT/OT/SLP Co-Evaluation/Treatment: Yes Reason for Co-Treatment: Complexity of the patient's impairments (multi-system involvement);For patient/therapist safety PT goals addressed during session: Mobility/safety with mobility OT goals addressed during session: ADL's and self-care;Other (comment) (mobility)      End of Session     Activity Tolerance Patient tolerated treatment well   Patient Left in bed;with call bell/phone within reach   Nurse Communication          Time: [id number] OT Time Calculation (min): 42 min  Charges: OT General Charges $OT Visit: 1 Procedure OT Treatments $Therapeutic Activity: 8-22 mins  Gaye AlkenBailey A Ashlan Dignan M.S., OTR/L Pager: [phone]  02/09/2016, 12:13 PM

## 2016-02-09 NOTE — Progress Notes (Signed)
Consent for loop recorder insertion signed and in chart. Chaplain consulted fro legal documents at this time.    Sim BoastHavy, RN

## 2016-02-09 NOTE — H&P (View-Only) (Signed)
Patient ID: Ronald Forbes, male   DOB: [birth year], 55 y.o.   MRN: [id number]    Patient Name: Ronald Forbes Date of Encounter: 02/08/2016     Principal Problem:   CVA (cerebral vascular accident) [facility](HCC) Active Problems:   Tobacco abuse   Alcohol use disorder (HCC)   Morbid obesity (HCC)   HLD (hyperlipidemia)   Diabetes mellitus type 2 in obese (HCC)    SUBJECTIVE  No chest pain or sob. Wants to go home.  CURRENT MEDS . aspirin  300 mg Rectal Daily   Or  . aspirin  325 mg Oral Daily  . atorvastatin  40 mg Oral q1800  . enoxaparin (LOVENOX) injection  0.5 mg/kg Subcutaneous Q24H  . nicotine  21 mg Transdermal Daily  . thiamine  100 mg Oral Daily   Or  . thiamine  100 mg Intravenous Daily    OBJECTIVE  Filed Vitals:   02/07/16 1845 02/07/16 2136 02/08/16 0108 02/08/16 0618  BP: 121/70 124/74 137/82 127/75  Pulse: 75 66 70 63  Temp: 99.1 F (37.3 C) 98.2 F (36.8 C) 98.3 F (36.8 C) 98.2 F (36.8 C)  TempSrc: Oral Oral Oral Oral  Resp: 18 18 18 20   Height:      Weight:      SpO2: 96% 92% 100% 96%   No intake or output data in the 24 hours ending 02/08/16 0827 Filed Weights   02/04/16 0849 02/04/16 1132 02/06/16 0825  Weight: 280 lb (127.007 kg) 269 lb 6.4 oz (122.2 kg) 269 lb 6.4 oz (122.2 kg)    PHYSICAL EXAM  General: Pleasant, NAD. Neuro: Alert and oriented X 3. Dense right HP, expressive aphasia Psych: blunted affect. HEENT:  Normal  Neck: Supple without bruits or JVD. Lungs:  Resp regular and unlabored, CTA. Heart: RRR no s3, s4, or murmurs. Abdomen: Soft, non-tender, non-distended, BS + x 4.  Extremities: No clubbing, cyanosis or edema. DP/PT/Radials 2+ and equal bilaterally.  Accessory Clinical Findings  CBC  Recent Labs  02/08/16 0535  WBC 10.1  HGB 15.5  HCT 44.7  MCV 92.0  PLT 161   Basic Metabolic Panel  Recent Labs  02/08/16 0535  NA 136  K 4.1  CL 103  CO2 24  GLUCOSE 139*  BUN 13  CREATININE 0.83  CALCIUM 9.1    Liver Function Tests No results for input(s): AST, ALT, ALKPHOS, BILITOT, PROT, ALBUMIN in the last 72 hours. No results for input(s): LIPASE, AMYLASE in the last 72 hours. Cardiac Enzymes No results for input(s): CKTOTAL, CKMB, CKMBINDEX, TROPONINI in the last 72 hours. BNP Invalid input(s): POCBNP D-Dimer No results for input(s): DDIMER in the last 72 hours. Hemoglobin A1C No results for input(s): HGBA1C in the last 72 hours. Fasting Lipid Panel No results for input(s): CHOL, HDL, LDLCALC, TRIG, CHOLHDL, LDLDIRECT in the last 72 hours. Thyroid Function Tests  Recent Labs  02/06/16 0635  T3FREE 2.9    TELE  nsr  Radiology/Studies  Ct Angio Head W Or Wo Contrast  02/04/2016  CLINICAL DATA:  Stroke. Right-sided weakness 2 weeks. Slurred speech. EXAM: CT ANGIOGRAPHY HEAD AND NECK TECHNIQUE: Multidetector CT imaging of the head and neck was performed using the standard protocol during bolus administration of intravenous contrast. Multiplanar CT image reconstructions and MIPs were obtained to evaluate the vascular anatomy. Carotid stenosis measurements (when applicable) are obtained utilizing NASCET criteria, using the distal internal carotid diameter as the denominator. CONTRAST:  50 mL Isovue 370 IV COMPARISON:  CT 02/04/2016  FINDINGS: CTA NECK Aortic arch: Normal aortic arch. Lung apices clear. Proximal great vessels widely patent. Right carotid system: Mild atherosclerotic disease at the right carotid bifurcation without stenosis. No dissection Left carotid system: Mild atherosclerotic disease at the bifurcation without stenosis or dissection. Vertebral arteries:Right vertebral artery dominant. Right vertebral artery widely patent to the basilar. Left vertebral artery is small and ends in PICA without significant contribution to the basilar. Skeleton: Cervical degenerative change.  No fracture or bony lesion. Other neck: Right thyroid lesion measuring 20 mm. This has irregular margins  and is noncalcified. Thyroid ultrasound and possible biopsy recommended to rule out neoplasm. CTA HEAD Anterior circulation: Mild atherosclerotic calcified gauge in the cavernous carotid bilaterally without significant stenosis. Anterior and middle cerebral arteries widely patent bilaterally without stenosis. Posterior circulation: Right vertebral artery patent to the basilar. Right PICA patent. Left vertebral artery hypoplastic ending in PICA without significant contribution to the basilar. Basilar widely patent. Superior cerebellar artery patent bilaterally. Posterior communicating artery patent bilaterally. Right posterior cerebral artery widely patent Left posterior cerebral artery occluded at the origin with possible faint reconstitution distally. Large subacute infarct left PCA territory. Venous sinuses: Patent Anatomic variants: negative for cerebral aneurysm Delayed phase: Slight enhancement along the periphery of the left PCA infarct compatible with subacute infarction. No enhancing mass lesion. IMPRESSION: Large subacute infarct left PCA territory Left posterior cerebral artery is occluded with faint reconstitution in the midportion. No significant carotid or vertebral artery stenosis in the neck. 2 cm right thyroid lesion. Recommend thyroid ultrasound and biopsy to rule out neoplasm. Electronically Signed   By: Marlan Palauharles  Clark M.D.   On: 02/04/2016 10:34   Ct Head Wo Contrast  02/04/2016  CLINICAL DATA:  Code stroke earlier today. Right-sided weakness for 2 weeks. Left PCA infarct. EXAM: CT HEAD WITHOUT CONTRAST TECHNIQUE: Contiguous axial images were obtained from the base of the skull through the vertex without intravenous contrast. COMPARISON:  CTA earlier same date. FINDINGS: 1708 hours. Brain: Large subacute infarct in the left posterior cerebral artery distribution is again noted, involving the medial left temporal, parietal and occipital lobes. This has not significantly changed compared with the  prior examination. There are chronic small vessel ischemic changes in the left basal ganglia. There is no midline shift, hydrocephalus or evidence of acute intracranial hemorrhage. Intracranial vascular calcifications are noted. Bones/sinuses/visualized face: The visualized paranasal sinuses, mastoid air cells and middle ears are clear. Cold lamina papyracea deformity on the right is noted. The calvarium is intact. IMPRESSION: 1. Stable large subacute left PCA distribution infarct from examination earlier today. 2. No new findings. Electronically Signed   By: Carey BullocksWilliam  Veazey M.D.   On: 02/04/2016 17:39   Ct Head Wo Contrast  02/04/2016  ADDENDUM REPORT: 02/04/2016 09:49 ADDENDUM: Findings discussed with Dr. Amada JupiterKirkpatrick on 02/04/2016 at 0945 hours. Electronically Signed   By: Ulyses SouthwardMark  Boles M.D.   On: 02/04/2016 09:49  02/04/2016  CLINICAL DATA:  Slurred speech, stroke symptoms, onset 2 weeks ago but worse this morning EXAM: CT HEAD WITHOUT CONTRAST TECHNIQUE: Contiguous axial images were obtained from the base of the skull through the vertex without intravenous contrast. COMPARISON:  None FINDINGS: Normal ventricular morphology. No midline shift. Large area of low attenuation involving the LEFT occipital and posterior parietal regions compatible with a LEFT PCA territory infarct. This extends into the posterior LEFT temporal lobe. Mild mass effect upon the atrium of the LEFT lateral ventricle. Old lacunar infarct anterior LEFT basal ganglia into anterior limb LEFT internal  capsule. No intracranial hemorrhage or mass lesion. Posterior fossa unremarkable. No extra-axial fluid collections. Bones and sinuses unremarkable. IMPRESSION: Large area of cortical infarction involving the posterior LEFT parietal and LEFT occipital lobes extending into posterior LEFT temporal lobe, probably subacute in age. Old appearing lacunar infarct anterior LEFT basal ganglia into the anterior limb of LEFT internal capsule. No midline  shift or intracranial hemorrhage identified. Electronically Signed: By: Ulyses Southward M.D. On: 02/04/2016 09:32   Ct Angio Neck W Or Wo Contrast  02/04/2016  CLINICAL DATA:  Stroke. Right-sided weakness 2 weeks. Slurred speech. EXAM: CT ANGIOGRAPHY HEAD AND NECK TECHNIQUE: Multidetector CT imaging of the head and neck was performed using the standard protocol during bolus administration of intravenous contrast. Multiplanar CT image reconstructions and MIPs were obtained to evaluate the vascular anatomy. Carotid stenosis measurements (when applicable) are obtained utilizing NASCET criteria, using the distal internal carotid diameter as the denominator. CONTRAST:  50 mL Isovue 370 IV COMPARISON:  CT 02/04/2016 FINDINGS: CTA NECK Aortic arch: Normal aortic arch. Lung apices clear. Proximal great vessels widely patent. Right carotid system: Mild atherosclerotic disease at the right carotid bifurcation without stenosis. No dissection Left carotid system: Mild atherosclerotic disease at the bifurcation without stenosis or dissection. Vertebral arteries:Right vertebral artery dominant. Right vertebral artery widely patent to the basilar. Left vertebral artery is small and ends in PICA without significant contribution to the basilar. Skeleton: Cervical degenerative change.  No fracture or bony lesion. Other neck: Right thyroid lesion measuring 20 mm. This has irregular margins and is noncalcified. Thyroid ultrasound and possible biopsy recommended to rule out neoplasm. CTA HEAD Anterior circulation: Mild atherosclerotic calcified gauge in the cavernous carotid bilaterally without significant stenosis. Anterior and middle cerebral arteries widely patent bilaterally without stenosis. Posterior circulation: Right vertebral artery patent to the basilar. Right PICA patent. Left vertebral artery hypoplastic ending in PICA without significant contribution to the basilar. Basilar widely patent. Superior cerebellar artery patent  bilaterally. Posterior communicating artery patent bilaterally. Right posterior cerebral artery widely patent Left posterior cerebral artery occluded at the origin with possible faint reconstitution distally. Large subacute infarct left PCA territory. Venous sinuses: Patent Anatomic variants: negative for cerebral aneurysm Delayed phase: Slight enhancement along the periphery of the left PCA infarct compatible with subacute infarction. No enhancing mass lesion. IMPRESSION: Large subacute infarct left PCA territory Left posterior cerebral artery is occluded with faint reconstitution in the midportion. No significant carotid or vertebral artery stenosis in the neck. 2 cm right thyroid lesion. Recommend thyroid ultrasound and biopsy to rule out neoplasm. Electronically Signed   By: Marlan Palau M.D.   On: 02/04/2016 10:34   Mr Brain Wo Contrast  02/04/2016  CLINICAL DATA:  RIGHT-sided weakness for 2 weeks.  Recent worsening EXAM: MRI HEAD WITHOUT CONTRAST TECHNIQUE: Multiplanar, multiecho pulse sequences of the brain and surrounding structures were obtained without intravenous contrast. COMPARISON:  Multiple prior CT studies earlier today. FINDINGS: The patient was unable to remain motionless for the exam. Small or subtle lesions could be overlooked. Marked restricted diffusion in the areas of CT demonstrated to have cytotoxic edema. This is consistent with a acute/subacute LEFT PCA territory infarct affecting the posterior temporal lobe, medial occipital lobe, and much of the thalamus. Restricted diffusion is noted to additionally involve the LEFT midbrain, specifically the LEFT cerebral peduncle, (see images 24 and 25, series 4) and likely accounts for the RIGHT hemiparesis. Early T2 shortening, and T1 prolongation, reflect gyriform type reperfusion hemorrhage. There is no significant confluent  lobar hematoma or measurable volume of blood. Absent flow void LEFT PCA as predicted from CTA. Normal for age cerebral  volume. Mild to moderate T2 and FLAIR hyperintensities throughout the white matter, premature for age, representing chronic microvascular ischemic change. There are at least two chronic hemorrhagic lacunar infarcts in the LEFT hemisphere, affecting the caudate as well as the periventricular white matter. Unremarkable midline structures. Negative orbits. Chronic medial blowout fracture, RIGHT orbit. IMPRESSION: Large acute/subacute LEFT PCA territory infarct. Early hemorrhagic transformation without lobar hematoma. Additional acute infarction of the LEFT midbrain likely accounts for the reported RIGHT hemiparesis. Chronic microvascular ischemic change with areas of chronic lacunar hemorrhagic insult as described. Electronically Signed   By: Elsie Stain M.D.   On: 02/04/2016 20:17    ASSESSMENT AND PLAN  1. Stroke - he has residual speech and right HP. He is able to give consent at this point. Will plan ILR insertion tomorrow. 2. HTN - note need for better long term blood pressure control. He is currently stable.   Gregg Taylor,M.D.  02/08/2016 8:27 AM

## 2016-02-09 NOTE — Interval H&P Note (Signed)
History and Physical Interval Note:  02/09/2016 12:07 PM  Ronald Forbes  has presented today for surgery, with the diagnosis of cva  The various methods of treatment have been discussed with the patient and family. After consideration of risks, benefits and other options for treatment, the patient has consented to  Procedure(s): Loop Recorder Insertion (N/A) as a surgical intervention .  The patient's history has been reviewed, patient examined, no change in status, stable for surgery.  I have reviewed the patient's chart and labs.  Questions were answered to the patient's satisfaction.     Sherryl MangesSteven Melony Tenpas  R weakness speech improving Procedure exsplained

## 2016-02-09 NOTE — Clinical Social Work Note (Signed)
Clinical Social Worker spoke pt's sister, Windell MouldingRuth at length in regards to SNF placement as an alternative plan to CIR. CSW plans to meet with pt's sister tomorrow morning, 6/27.   Psychosocial assessment completed. Full psychosocial assessment to follow.   Derenda FennelBashira Jacqualyn Sedgwick, MSW, LCSWA [phone] 02/09/2016 3:10 PM

## 2016-02-09 NOTE — Progress Notes (Signed)
   02/09/16 1115  Clinical Encounter Type  Visited With Patient;Health care provider  Referral From Nurse  Spiritual Encounters  Spiritual Needs (Advanced Directive)  Chaplain visited patient per consult.  Patient was with PT.

## 2016-02-10 LAB — GLUCOSE, CAPILLARY
GLUCOSE-CAPILLARY: 152 mg/dL — AB (ref 65–99)
GLUCOSE-CAPILLARY: 193 mg/dL — AB (ref 65–99)

## 2016-02-10 MED ORDER — ASPIRIN 81 MG PO CHEW: 81.0000 mg | CHEWABLE_TABLET | Freq: Every day | ORAL | Status: DC

## 2016-02-10 MED ORDER — ATORVASTATIN CALCIUM 40 MG PO TABS: 40.0000 mg | ORAL_TABLET | Freq: Every day | ORAL | Status: DC

## 2016-02-10 NOTE — Progress Notes (Signed)
   02/10/16 1000  What Happened  Was fall witnessed? No  Was patient injured? No  Patient found on floor  Found by Staff-comment  Stated prior activity other (comment) (leaning on bed side table- unassisted)  Follow Up  MD notified yes   Time MD notified [id number]  Family notified No- patient refusal (stated "no, it's ok")  Additional tests No  Simple treatment Other (comment) (patient was unharm)  Progress note created (see row info) Yes  Adult Fall Risk Assessment  Risk Factor Category (scoring not indicated) High fall risk per protocol (document High fall risk)  Patient's Fall Risk High Fall Risk (>13 points)  Adult Fall Risk Interventions  Required Bundle Interventions *See Row Information* High fall risk - low, moderate, and high requirements implemented  Additional Interventions PT/OT need assessed if change in mobility from baseline  Screening for Fall Injury Risk  Risk For Fall Injury- See Row Information  Nurse judgement  Injury Prevention Interventions Safety Sitter/Safety Rounder (tech and RN rounding on patient hourly)  Vitals  Temp (taken at 9:53)  Neurological  Level of Consciousness Alert  Orientation Level Oriented to person;Oriented to place  Cognition Follows commands;Impulsive;Poor judgement;Poor safety awareness  Speech Slurred/Dysarthria;Delayed responses;Expressive aphasia  Facial Symmetry Asymmetrical right  R Hand Grip Absent  L Hand Grip Strong  RUE Sensation Decreased  RUE Motor Strength 1  LUE Motor Response Purposeful movement  LUE Sensation Full sensation  LUE Motor Strength 5  RLE Motor Response Non-purposeful movement  RLE Sensation Decreased  RLE Motor Strength 1  LLE Motor Response Purposeful movement  LLE Sensation Full sensation  LLE Motor Strength 5  Glasgow Coma Scale  Eye Opening 4  Best Verbal Response (NON-intubated) 5  Best Motor Response 6  Glasgow Coma Scale Score 15  Musculoskeletal Details  RUE Paralysis  RLE Paralysis

## 2016-02-10 NOTE — Progress Notes (Signed)
Chaplain followed up with the patient to complete his Advance Directive.  Chaplain contacted the Notary, and two witnesses for signature and Advance Directive was completed. Copies will be provided for family, a copy will be placed in the patient's chart,  the original document will be given to the patient.  Chaplain spiritual care support will continue as needed. Chaplain Janell QuietAudrey Thaila Bottoms [phone]

## 2016-02-10 NOTE — Progress Notes (Signed)
Pt ready for discharge to [facility]. Report called. AVS printed for transport. IV removed. Pt. Is alert and oriented. Pt is hemodynamically stable. Discharge plan appropriate and in place.

## 2016-02-10 NOTE — Discharge Instructions (Signed)
Please continue Aspirin and Atorvastatin (Lipitor) to help reduce risk for further stroke. It is important that you try your best to quit smoking as this can increase risk for recurrent stroke. We are discharging you to a rehab center to help with your speech and right sided weakness.    Heart monitor implant wound instructions: Do not shower or get the wound wet for 3 days. If any drainage, bleeding, or signs of infection, please return.

## 2016-02-10 NOTE — Progress Notes (Signed)
Subjective: Patient had a fall this morning when trying to sit up on the edge of bed and leaning on the bedside table which slipped away. He did not hit his head or injure himself. He is working with Speech Therapy and feels that he is making progress with regards to his aphasia. He has not noticed any improvement with his right sided weakness.  Objective: Vital signs in last 24 hours: Filed Vitals:   02/09/16 2050 02/10/16 0117 02/10/16 0541 02/10/16 0953  BP: 131/66 124/68 132/81 113/75  Pulse: 66 85 62 78  Temp:  98.6 F (37 C) 98 F (36.7 C) 98.9 F (37.2 C)  TempSrc: Oral Oral Oral Oral  Resp: 20 20 20 20   Height:      Weight:      SpO2: 97% 95% 98% 99%   Weight change:   Intake/Output Summary (Last 24 hours) at 02/10/16 1323 Last data filed at 02/09/16 2050  Gross per 24 hour  Intake      0 ml  Output    550 ml  Net   -550 ml    General: resting in bed, no acute distress HEENT: right lateral visual field deficit Cardiac: RRR, no rubs, murmurs or gallops, ILR in place Pulm: clear to auscultation anteriorly, moving normal volumes of air Abd: soft, nontender, nondistended, BS present Ext: warm and well perfused, no pedal edema Neuro: answering questions appropriately, RUE 0/5, RLE 1/5, LUE and LLE 5/5. Unable to squeeze fingers with right hand, cannot wiggle toes on right leg. Can raise RLE slightly off bed against gravity.  Assessment/Plan: Principal Problem:   CVA (cerebral vascular accident) (HCC) Active Problems:   Tobacco abuse   Alcohol use disorder (HCC)   Morbid obesity (HCC)   HLD (hyperlipidemia)   Diabetes mellitus type 2 in obese [facility](HCC)   Cerebrovascular accident (CVA) due to thrombosis of left posterior cerebral artery (HCC)  Left PCA territory and midbrain infarct: CTA Head and Neck showed a large subacute infarct of the left PCA territory from left posterior cerebral artery occlusion. MRI shows a large acute/subacute PCA territory infarct and acute  left midbrain infarct. TTE with EF 55-60%, no PFO. Neurology assessed it to be an embolic cause of unknown source though TEE was reassuring. Loop recorder implanted on 6/26. CIR without any bed availability. Outpatient rehab bed is available today. He is stable for discharge to outpatient rehab. -ASA 81 mg -Continue Atorvastatin 40 mg daily  Elevated total protein: Resolved. On admission, AST/ALT is 47/25 with mild increased bilirubin of 1.7 and total protein elevated at >12.0. UA does show proteinuria at 100 mg/dL. Repeat CMP has normalized. HCV, HIV reassuring.  Thyroid nodule: 2 cm right thyroid lesion was seen on CTA. TSH slightly low at 0.318 with reassuring free T4 0.91, free T3 2.9. -Consider thyroid U/S and possible biopsy outpatient  EtOH use: Reports drinking a 6 pack nightly, denies history of withdrawal or seizures. -Discontinued CIWA protocol now that he has been at least 72 hours from last drink -Thiamine, Folic Acid, MVM  Tobacco use: Smoked 1 PPD for ~40 years.  -Smoking cessation counseling -Nicotine patch  Type 2 diabetes: A1c 6.6 which is borderline and consistent with CBGs throughout his hospital stay of mid 100s to low 200s. -Recommend lifestyle modification   Dispo: Anticipated discharge to SNF today.  The patient does not have a current PCP (No primary care provider on file.) and [facility].  The patient  does have transportation limitations that hinder transportation to clinic appointments.    LOS: 6 days   Darreld McleanVishal Donato Studley, MD 02/10/2016, 1:23 PM

## 2016-02-10 NOTE — Clinical Social Work Note (Signed)
[facility]. Letter of Guarantee extended for 30 days room and board and 14 days PT,OT,ST.   CSW remains available as needed.   Derenda FennelBashira Vaniya Augspurger, MSW, LCSWA [phone] 02/10/2016 1:15 PM

## 2016-02-10 NOTE — Clinical Social Work Note (Signed)
Clinical Social Work Assessment  Patient Details  Name: Ronald Forbes MRN: [id number] Date of Birth: Aug 12, 1961  Date of referral:  02/10/16               Reason for consult:  Facility Placement, Discharge Planning                Permission sought to share information with:  Ronald Forbes Permission granted to share information::  Yes, Verbal Permission Granted  Name::      Ronald Forbes)  Agency::   (SNF's )  Relationship::   (Forbes-in-law)  Contact Information:   [phone])  Housing/Transportation Living arrangements for the past 2 months:  Apartment Source of Information:   Radiation protection practitioner) Patient Interpreter Needed:  None Criminal Activity/Legal Involvement Pertinent to Current Situation/Hospitalization:  No - Comment as needed Significant Relationships:  Siblings, Other Ronald Members Lives with:  Self Do you feel safe going back to the place where you live?  No Need for Ronald participation in patient care:  Yes (Comment)  Care giving concerns:  Patient with acute stroke and requiring acute rehab vs ST SNF. Currently no beds in CIR.    Social Worker assessment / plan:  CSW met with patient's Forbes, Ronald Holler in reference to post-acute placement for SNF. CSW introduced CSW role and SNF process. CSW also reviewed and provided SNF list. CSW explained that SNF options would be limited due to Ronald Forbes having no insurance. Ronald Forbes expressed understanding and stated Ronald appreciated social work interventions. CSW provided Medicaid application as Ronald Forbes plans to begin process. Ronald Forbes also met with [facility] lia[facility] who has extended bed offer under Letter of Guarantee (LOG) 30 days room/board and 14 days Ronald Forbes, OT, ST.   CSW contacted financial counseling to follow up with Ronald on disability/Medicaid application process. Financial counseling to meet with patient and Ronald today before d/c. No further concerns reported at this time. CSW will continue to follow  Ronald Forbes and Ronald Ronald for continued support and to facilitate Ronald d/c needs.    Employment status:  [employer] information:  Self Pay (Medicaid Pending) Ronald Forbes Recommendations:  Inpatient Rehab Consult Information / Referral to community resources:  [place]  Patient/Ronald's Response to care:  Ronald Forbes alert and oriented x2. Ronald Ronald supportive and strongly involved in Ronald care. Ronald appreciative of social work interventions.   Patient/Ronald's Understanding of and Emotional Response to Diagnosis, Current Treatment, and Prognosis:  Ronald Ronald knowledgeable of acute stroke and continued care.   Emotional Assessment Appearance:  Appears stated age Attitude/Demeanor/Rapport:  Unable to Assess Affect (typically observed):  Unable to Assess Orientation:  Oriented to Self, Oriented to Place Alcohol / Substance use:  Not Applicable Psych involvement (Current and /or in the community):  No (Comment)  Discharge Needs  Concerns to be addressed:  Care Coordination, Basic Needs Readmission within the last 30 days:  No Current discharge risk:  Dependent with Mobility Barriers to Discharge:  Inadequate or no insurance   Glendon Axe, MSW, LCSWA [phone] 02/10/2016 1:28 PM

## 2016-02-10 NOTE — Progress Notes (Signed)
RN heard patient's bed alarm and ran into the room to find patient trying to sit up. Patient stated that he was NOT getting but trying to find a new comfortable position. RN stated that not try to get up and placed bed alarm back on. As RN left the room, about 30 seconds later, the bed alarm went off. RN walked in to find patient on the floor. RN asked what had happened and patient replied that he tried to lean on the table- which wheeled out from under him. MD has been notified along with charge nurse. Vitals will be taken at this time. Patient denies hitting his head. No abrasions or bruising found.

## 2016-02-10 NOTE — Progress Notes (Signed)
Speech Language Pathology Treatment: Cognitive-Linquistic  Patient Details Name: Ronald Forbes MRN: [id number] DOB: [birth year] Today's Date: 02/10/2016 Time: [id number] SLP Time Calculation (min) (ACUTE ONLY): 29 min  Assessment / Plan / Recommendation Clinical Impression  Pt seen for f/u SLP tx today. While his initial accuracy remains at about 30% with confrontational naming, he is more receptive to cueing today and is able to increase his accuracy to 90% with the use of sentence completion and phonemic cues. Conversational speech is marked by word-finding errors and phonemic paraphasias, needing Max cues for use of word-finding strategies. Continue to recommend CIR-level f/u upon d/c.   HPI HPI: Pt is a 55 y.o. male with PMH of alcohol/ tobacco abuse, presented to ED on 6/21 with slurred speech and R side weakness. Pt reports slurred speech and trouble speaking intended words but understands when spoken to and recognizes when saying words not intended. Also reports decreased sensaiton on R side of face. MRI showed large subacute L PCA territory infarct affecting posterior temporal/ medial occipital lobes and much of thalamus, early hemorrhagic transformation; additional acute infarct of L midbrain- cerebral peduncle. Bedside swallow eval ordered- pt passed RN stroke swallow screen but RN reported concerns with alertness levels.       SLP Plan  Continue with current plan of care     Recommendations                Oral Care Recommendations: Oral care BID Follow up Recommendations: Inpatient Rehab Plan: Continue with current plan of care     GO               Ronald Forbes, M.A. CCC-SLP [phone]  Ronald Forbes, Ronald Forbes 02/10/2016, 11:41 AM

## 2016-02-10 NOTE — Progress Notes (Signed)
I do not have an inpt rehab bed available to admit this pt to today. RN CM and SW are aware. [phone] 

## 2016-02-10 NOTE — Care Management Note (Signed)
Case Management Note  Patient Details  Name: Ronald Forbes MRN: [id number] Date of Birth: [birth year]  Subjective/Objective:                    Action/Plan: [facility]. No further needs per CM.   Expected Discharge Date:   (Pending)               Expected Discharge Plan:  Skilled Nursing Facility  In-House Referral:  Clinical Social Work  Discharge planning Services  CM Consult  Post Acute Care Choice:    Choice offered to:     DME Arranged:    DME Agency:     HH Arranged:    HH Agency:     Status of Service:  Completed, signed off  If discussed at [employer] Meetings, dates discussed:    Additional Comments:  Kermit BaloKelli F Delron Comer, RN 02/10/2016, 2:54 PM

## 2016-02-10 NOTE — Clinical Social Work Note (Addendum)
Clinical Social Worker facilitated patient discharge including contacting patient family and facility to confirm patient discharge plans.  Clinical information faxed to facility and family agreeable with plan.  CSW arranged [facility] via [facility].  RN to call report prior to discharge.  2:29PM- Pt's sister, Windell MouldingRuth has completed disability/Medicaid application with [employer] counselor. [facility] to follow up with pt's sister.   Clinical Social Worker will sign off for now as social work intervention is no longer needed. Please consult us again if new need arises.  Derenda FennelBashira Schuyler Behan, MSW, LCSWA [phone] 02/10/2016 1:59 PM

## 2016-02-10 NOTE — Clinical Social Work Placement (Signed)
   CLINICAL SOCIAL WORK PLACEMENT  NOTE  Date:  02/10/2016  Patient Details  Name: Ronald HobbyKevin Kidd MRN: [id number] Date of Birth: Dec 04, 1960  Clinical Social Work is seeking post-discharge placement for this patient at the Skilled  Nursing Facility level of care (*CSW will initial, date and re-position this form in  chart as items are completed):  Yes   Patient/family provided with [place] Clinical Social Work Department's list of facilities offering this level of care within the geographic area requested by the patient (or if unable, by the patient's family).  Yes   Patient/family informed of their freedom to choose among providers that offer the needed level of care, that participate in Medicare, Medicaid or managed care program needed by the patient, have an available bed and are willing to accept the patient.  Yes   Patient/family informed of [facility] ownership interest [facility], as well as of the fact that they are under no obligation to receive care at these facilities.  PASRR submitted to EDS on 02/09/16     PASRR number received on 02/09/16     Existing PASRR number confirmed on       FL2 transmitted to all facilities in geographic area requested by pt/family on 02/09/16     FL2 transmitted to all facilities within larger geographic area on       Patient informed that his/her managed care company has contracts with or will negotiate with certain facilities, including the following:        Yes   Patient/family informed of bed offers received.  [facility] bed [facility]  [facility]     Physician recommends and patient chooses bed at      Patient to be transferred to  [facility] ) on 02/10/16.  Patient to be transferred to facility by  [facility] )     Patient family notified on 02/10/16 of transfer.  Name of family member notified:   (Pt's sister, Windell MouldingRuth )     PHYSICIAN Please sign FL2,  Please prepare priority discharge summary, including medications     Additional Comment:    _______________________________________________ Vaughan BrownerNixon, Varian Innes A, LCSW 02/10/2016, 1:29 PM

## 2016-02-11 ENCOUNTER — Telehealth: Payer: Self-pay | Admitting: Internal Medicine

## 2016-02-11 NOTE — Telephone Encounter (Signed)
New message      The occp. PT has a question concerning the loop record wound check, The pt is S/p [place] and the question if it ok to do the electronic stimulation is ok.

## 2016-02-11 NOTE — Telephone Encounter (Signed)
Spoke w/ therapist and informed her that the loop recorder is a non therapy device and that as long as the stimulation would be 12 inches or more from the loop recorder it should be fine to use. But informed her to call [employer] w/ further questions. Therapist verbalized understanding.

## 2016-02-12 ENCOUNTER — Encounter: Payer: Self-pay | Admitting: Internal Medicine

## 2016-02-12 ENCOUNTER — Non-Acute Institutional Stay (SKILLED_NURSING_FACILITY): Payer: Medicaid Other | Admitting: Internal Medicine

## 2016-02-12 DIAGNOSIS — I63432 Cerebral infarction due to embolism of left posterior cerebral artery: Secondary | ICD-10-CM

## 2016-02-12 DIAGNOSIS — F1099 Alcohol use, unspecified with unspecified alcohol-induced disorder: Secondary | ICD-10-CM

## 2016-02-12 DIAGNOSIS — E785 Hyperlipidemia, unspecified: Secondary | ICD-10-CM | POA: Diagnosis not present

## 2016-02-12 DIAGNOSIS — E669 Obesity, unspecified: Secondary | ICD-10-CM

## 2016-02-12 DIAGNOSIS — E119 Type 2 diabetes mellitus without complications: Secondary | ICD-10-CM

## 2016-02-12 DIAGNOSIS — Z720 Tobacco use: Secondary | ICD-10-CM | POA: Diagnosis not present

## 2016-02-12 DIAGNOSIS — E041 Nontoxic single thyroid nodule: Secondary | ICD-10-CM | POA: Diagnosis not present

## 2016-02-12 DIAGNOSIS — IMO0002 Reserved for concepts with insufficient information to code with codable children: Secondary | ICD-10-CM

## 2016-02-12 DIAGNOSIS — R778 Other specified abnormalities of plasma proteins: Secondary | ICD-10-CM | POA: Diagnosis not present

## 2016-02-12 DIAGNOSIS — E1169 Type 2 diabetes mellitus with other specified complication: Secondary | ICD-10-CM

## 2016-02-12 NOTE — Progress Notes (Signed)
MRN: [id number] Name: Ronald HobbyKevin Looney  Sex: male Age: 55 y.o. DOB: 09/04/1960  PSC #:  Facility/Room: [facility] / 119 A Level Of Care: SNF Provider: Randon GoldsmithAnne D. Lyn HollingsheadAlexander, MD Emergency Contacts: Extended Emergency Contact Information Primary Emergency Contact: Radene GunningVandunk,Guy  [place] Home Phone: [phone] Mobile Phone: [phone] Relation: Brother Secondary Emergency Contact: Arbutus PedVandunk,Ruth  [place] Mobile Phone: [phone] Relation: Other  Code Status: Full Code  Allergies: Review of patient's allergies indicates no known allergies.  Chief Complaint  Patient presents with  . New Admit To SNF    Admit to Facility    HPI: Patient is 55 y.o. male with PMH of alcohol and tobacco use who presented to the ED with slurred speech and right sided weakness. History was limited due to patient status. Patient stated that he began to have severe headaches about 2 weeks ago that were not relieved with OTC tylenol. He said the headaches progressively worsened and he noticed numbness/tingling and some weakness in his right side. He said this slowly worsened until yesterday when he was unable to move his right arm or leg. He reports slurred speech with trouble speakings intended words, but understands when spoken to and recognizes when he is saying words he does not mean to say. He reported decreased sensation on the right side of his face.   In the ED, CT head showed a large cortical infarct involving the posterior left parietal and occipital lobes extending into the posterior left temporal lobe, likely subacute. An old infarct of the left basal ganglia was also seen. CTA Head and Neck showed a large subacute infarct of the left PCA territory. The left posterior cerebral artery is occluded. A 2 cm right thyroid lesion was incidentally seen. Pt [facility]27 where he was worked up and received supportive care and initial PT. Pt is admitted to SNF for  continued OT/PT. While at SNF pt will be followed for DM2, tx with diet, alcoholism, tx with abstinence and to abuse tx with nicoderm patch  Past Medical History  Diagnosis Date  . Stroke (HCC)   . Tobacco abuse   . HLD (hyperlipidemia)   . Alcohol use disorder (HCC)   . Morbid obesity (HCC)   . Cerebral vascular accident (HCC)   . 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 Diabetes mellitus type 2 in obese [facility](HCC)     Past Surgical History  Procedure Laterality Date  . Tee without cardioversion N/A 02/06/2016    Procedure: TRANSESOPHAGEAL ECHOCARDIOGRAM (TEE);  Surgeon: Wendall StadePeter C Nishan, MD;  [facility];  Service: Cardiovascular;  Laterality: N/A;  . Ep implantable device N/A 02/09/2016    Procedure: Loop Recorder Insertion;  Surgeon: Duke SalviaSteven C Klein, MD;  [facility];  Service: Cardiovascular;  Laterality: N/A;      Medication List       This list is accurate as of: 02/12/16 11:59 PM.  Always use your most recent med list.               aspirin 81 MG chewable tablet  Chew 1 tablet (81 mg total) by mouth daily.     atorvastatin 40 MG tablet  Commonly known as:  LIPITOR  Take 1 tablet (40 mg total) by mouth daily at 6 PM.     ibuprofen 200 MG tablet  Commonly known as:  ADVIL,MOTRIN  Take 800 mg by mouth every 6 (six) hours  as needed for mild pain.     multivitamin with minerals tablet  Take 1 tablet by mouth daily.        No orders of the defined types were placed in this encounter.     There is no immunization history on file for this patient.  Social History  Substance Use Topics  . Smoking status: Current Every Day Smoker -- 1.00 packs/day for 40 years    Types: Cigarettes  . Smokeless tobacco: Not on file  . Alcohol Use: 0.0 oz/week    0 Standard drinks or equivalent per week     Comment: 6 pack/day    Family history is + DM  No family history on file.    Review of  Systems  DATA OBTAINED: from patient, nurse GENERAL:  no fevers, fatigue, appetite changes SKIN: No itching, rash or wounds EYES: No eye pain, redness, discharge EARS: No earache, tinnitus, change in hearing NOSE: No congestion, drainage or bleeding  MOUTH/THROAT: No mouth or tooth pain, No sore throat RESPIRATORY: No cough, wheezing, SOB CARDIAC: No chest pain, palpitations, lower extremity edema  GI: No abdominal pain, No N/V/D or constipation, No heartburn or reflux  GU: No dysuria, frequency or urgency, or incontinence  MUSCULOSKELETAL: No unrelieved bone/joint pain NEUROLOGIC: No headache, dizziness;+ focal weakness PSYCHIATRIC: No c/o anxiety or sadness   Filed Vitals:   02/12/16 0921  BP: 150/88  Pulse: 65  Temp: 97.8 F (36.6 C)  Resp: 12    SpO2 Readings from Last 1 Encounters:  02/10/16 99%        Physical Exam  GENERAL APPEARANCE: Alert, conversant,  No acute distress.  SKIN: No diaphoresis rash HEAD: Normocephalic, atraumatic  EYES: Conjunctiva/lids clear. Pupils round, reactive. EOMs intact.  EARS: External exam WNL, canals clear. Hearing grossly normal.  NOSE: No deformity or discharge.  MOUTH/THROAT: Lips w/o lesions  RESPIRATORY: Breathing is even, unlabored. Lung sounds are clear   CARDIOVASCULAR: Heart RRR no murmurs, rubs or gallops. No peripheral edema.   GASTROINTESTINAL: Abdomen is soft, non-tender, not distended w/ normal bowel sounds. GENITOURINARY: Bladder non tender, not distended  MUSCULOSKELETAL: No abnormal joints or musculature NEUROLOGIC:  Cranial nerves 2-12 grossly intact.R side weakness, dysarthria PSYCHIATRIC: Mood and affect appropriate to situation, no behavioral issues  Patient Active Problem List   Diagnosis Date Noted  . Thyroid nodule 02/29/2016  . 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 Cerebrovascular accident (CVA) due to embolism of left posterior cerebral artery (HCC)   . Diabetes mellitus type 2 in obese (HCC)  02/07/2016  . HLD (hyperlipidemia)   . Tobacco abuse 02/04/2016  . Alcohol use disorder (HCC) 02/04/2016  . Morbid obesity (HCC) 02/04/2016  . CVA (cerebral vascular accident) (HCC) 02/04/2016       Component Value Date/Time   WBC 10.1 02/08/2016 0535   WBC 9.9 02/04/2016   RBC 4.86 02/08/2016 0535   HGB 15.5 02/08/2016 0535   HCT 44.7 02/08/2016 0535   PLT 161 02/08/2016 0535   MCV 92.0 02/08/2016 0535   LYMPHSABS 2.2 02/04/2016 0849   MONOABS 0.6 02/04/2016 0849   EOSABS 0.1 02/04/2016 0849   BASOSABS 0.0 02/04/2016 0849        Component Value Date/Time   NA 136 02/08/2016 0535   NA 136* 02/04/2016   K 4.1 02/08/2016 0535   CL 103 02/08/2016 0535   CO2 24 02/08/2016 0535   GLUCOSE 139* 02/08/2016 0535   BUN 13 02/08/2016 0535   BUN 15 02/04/2016  CREATININE 0.83 02/08/2016 0535   CREATININE 0.8 02/04/2016   CALCIUM 9.1 02/08/2016 0535   PROT 7.1 02/05/2016 0001   ALBUMIN 3.9 02/05/2016 0001   AST 23 02/05/2016 0001   ALT 29 02/05/2016 0001   ALKPHOS 53 02/05/2016 0001   BILITOT 0.9 02/05/2016 0001   GFRNONAA >60 02/08/2016 0535   GFRAA >60 02/08/2016 0535    Lab Results  Component Value Date   HGBA1C 6.6* 02/05/2016    Lab Results  Component Value Date   CHOL 221* 02/05/2016   HDL 36* 02/05/2016   LDLCALC 112* 02/05/2016   TRIG 364* 02/05/2016   CHOLHDL 6.1 02/05/2016     Ct Angio Head W Or Forbes Contrast  02/04/2016  CLINICAL DATA:  Stroke. Right-sided weakness 2 weeks. Slurred speech. EXAM: CT ANGIOGRAPHY HEAD AND NECK TECHNIQUE: Multidetector CT imaging of the head and neck was performed using the standard protocol during bolus administration of intravenous contrast. Multiplanar CT image reconstructions and MIPs were obtained to evaluate the vascular anatomy. Carotid stenosis measurements (when applicable) are obtained utilizing NASCET criteria, using the distal internal carotid diameter as the denominator. CONTRAST:  50 mL Isovue 370 IV COMPARISON:  CT  02/04/2016 FINDINGS: CTA NECK Aortic arch: Normal aortic arch. Lung apices clear. Proximal great vessels widely patent. Right carotid system: Mild atherosclerotic disease at the right carotid bifurcation without stenosis. No dissection Left carotid system: Mild atherosclerotic disease at the bifurcation without stenosis or dissection. Vertebral arteries:Right vertebral artery dominant. Right vertebral artery widely patent to the basilar. Left vertebral artery is small and ends in PICA without significant contribution to the basilar. Skeleton: Cervical degenerative change.  No fracture or bony lesion. Other neck: Right thyroid lesion measuring 20 mm. This has irregular margins and is noncalcified. Thyroid ultrasound and possible biopsy recommended to rule out neoplasm. CTA HEAD Anterior circulation: Mild atherosclerotic calcified gauge in the cavernous carotid bilaterally without significant stenosis. Anterior and middle cerebral arteries widely patent bilaterally without stenosis. Posterior circulation: Right vertebral artery patent to the basilar. Right PICA patent. Left vertebral artery hypoplastic ending in PICA without significant contribution to the basilar. Basilar widely patent. Superior cerebellar artery patent bilaterally. Posterior communicating artery patent bilaterally. Right posterior cerebral artery widely patent Left posterior cerebral artery occluded at the origin with possible faint reconstitution distally. Large subacute infarct left PCA territory. Venous sinuses: Patent Anatomic variants: negative for cerebral aneurysm Delayed phase: Slight enhancement along the periphery of the left PCA infarct compatible with subacute infarction. No enhancing mass lesion. IMPRESSION: Large subacute infarct left PCA territory Left posterior cerebral artery is occluded with faint reconstitution in the midportion. No significant carotid or vertebral artery stenosis in the neck. 2 cm right thyroid lesion. Recommend  thyroid ultrasound and biopsy to rule out neoplasm. Electronically Signed   By: Marlan Palauharles  Clark M.D.   On: 02/04/2016 10:34   Ct Head Forbes Contrast  02/04/2016  CLINICAL DATA:  Code stroke earlier today. Right-sided weakness for 2 weeks. Left PCA infarct. EXAM: CT HEAD WITHOUT CONTRAST TECHNIQUE: Contiguous axial images were obtained from the base of the skull through the vertex without intravenous contrast. COMPARISON:  CTA earlier same date. FINDINGS: 1708 hours. Brain: Large subacute infarct in the left posterior cerebral artery distribution is again noted, involving the medial left temporal, parietal and occipital lobes. This has not significantly changed compared with the prior examination. There are chronic small vessel ischemic changes in the left basal ganglia. There is no midline shift, hydrocephalus or evidence of acute  intracranial hemorrhage. Intracranial vascular calcifications are noted. Bones/sinuses/visualized face: The visualized paranasal sinuses, mastoid air cells and middle ears are clear. Cold lamina papyracea deformity on the right is noted. The calvarium is intact. IMPRESSION: 1. Stable large subacute left PCA distribution infarct from examination earlier today. 2. No new findings. Electronically Signed   By: Carey Bullocks M.D.   On: 02/04/2016 17:39   Ct Head Forbes Contrast  02/04/2016  ADDENDUM REPORT: 02/04/2016 09:49 ADDENDUM: Findings discussed with Dr. Amada Jupiter on 02/04/2016 at 0945 hours. Electronically Signed   By: Ulyses Southward M.D.   On: 02/04/2016 09:49  02/04/2016  CLINICAL DATA:  Slurred speech, stroke symptoms, onset 2 weeks ago but worse this morning EXAM: CT HEAD WITHOUT CONTRAST TECHNIQUE: Contiguous axial images were obtained from the base of the skull through the vertex without intravenous contrast. COMPARISON:  None FINDINGS: Normal ventricular morphology. No midline shift. Large area of low attenuation involving the LEFT occipital and posterior parietal regions  compatible with a LEFT PCA territory infarct. This extends into the posterior LEFT temporal lobe. Mild mass effect upon the atrium of the LEFT lateral ventricle. Old lacunar infarct anterior LEFT basal ganglia into anterior limb LEFT internal capsule. No intracranial hemorrhage or mass lesion. Posterior fossa unremarkable. No extra-axial fluid collections. Bones and sinuses unremarkable. IMPRESSION: Large area of cortical infarction involving the posterior LEFT parietal and LEFT occipital lobes extending into posterior LEFT temporal lobe, probably subacute in age. Old appearing lacunar infarct anterior LEFT basal ganglia into the anterior limb of LEFT internal capsule. No midline shift or intracranial hemorrhage identified. Electronically Signed: By: Ulyses Southward M.D. On: 02/04/2016 09:32   Ct Angio Neck W Or Forbes Contrast  02/04/2016  CLINICAL DATA:  Stroke. Right-sided weakness 2 weeks. Slurred speech. EXAM: CT ANGIOGRAPHY HEAD AND NECK TECHNIQUE: Multidetector CT imaging of the head and neck was performed using the standard protocol during bolus administration of intravenous contrast. Multiplanar CT image reconstructions and MIPs were obtained to evaluate the vascular anatomy. Carotid stenosis measurements (when applicable) are obtained utilizing NASCET criteria, using the distal internal carotid diameter as the denominator. CONTRAST:  50 mL Isovue 370 IV COMPARISON:  CT 02/04/2016 FINDINGS: CTA NECK Aortic arch: Normal aortic arch. Lung apices clear. Proximal great vessels widely patent. Right carotid system: Mild atherosclerotic disease at the right carotid bifurcation without stenosis. No dissection Left carotid system: Mild atherosclerotic disease at the bifurcation without stenosis or dissection. Vertebral arteries:Right vertebral artery dominant. Right vertebral artery widely patent to the basilar. Left vertebral artery is small and ends in PICA without significant contribution to the basilar. Skeleton:  Cervical degenerative change.  No fracture or bony lesion. Other neck: Right thyroid lesion measuring 20 mm. This has irregular margins and is noncalcified. Thyroid ultrasound and possible biopsy recommended to rule out neoplasm. CTA HEAD Anterior circulation: Mild atherosclerotic calcified gauge in the cavernous carotid bilaterally without significant stenosis. Anterior and middle cerebral arteries widely patent bilaterally without stenosis. Posterior circulation: Right vertebral artery patent to the basilar. Right PICA patent. Left vertebral artery hypoplastic ending in PICA without significant contribution to the basilar. Basilar widely patent. Superior cerebellar artery patent bilaterally. Posterior communicating artery patent bilaterally. Right posterior cerebral artery widely patent Left posterior cerebral artery occluded at the origin with possible faint reconstitution distally. Large subacute infarct left PCA territory. Venous sinuses: Patent Anatomic variants: negative for cerebral aneurysm Delayed phase: Slight enhancement along the periphery of the left PCA infarct compatible with subacute infarction. No enhancing mass lesion.  IMPRESSION: Large subacute infarct left PCA territory Left posterior cerebral artery is occluded with faint reconstitution in the midportion. No significant carotid or vertebral artery stenosis in the neck. 2 cm right thyroid lesion. Recommend thyroid ultrasound and biopsy to rule out neoplasm. Electronically Signed   By: Marlan Palau M.D.   On: 02/04/2016 10:34   Ronald Forbes Contrast  02/04/2016  CLINICAL DATA:  RIGHT-sided weakness for 2 weeks.  Recent worsening EXAM: MRI HEAD WITHOUT CONTRAST TECHNIQUE: Multiplanar, multiecho pulse sequences of the brain and surrounding structures were obtained without intravenous contrast. COMPARISON:  Multiple prior CT studies earlier today. FINDINGS: The patient was unable to remain motionless for the exam. Small or subtle lesions could  be overlooked. Marked restricted diffusion in the areas of CT demonstrated to have cytotoxic edema. This is consistent with a acute/subacute LEFT PCA territory infarct affecting the posterior temporal lobe, medial occipital lobe, and much of the thalamus. Restricted diffusion is noted to additionally involve the LEFT midbrain, specifically the LEFT cerebral peduncle, (see images 24 and 25, series 4) and likely accounts for the RIGHT hemiparesis. Early T2 shortening, and T1 prolongation, reflect gyriform type reperfusion hemorrhage. There is no significant confluent lobar hematoma or measurable volume of blood. Absent flow void LEFT PCA as predicted from CTA. Normal for age cerebral volume. Mild to moderate T2 and FLAIR hyperintensities throughout the white matter, premature for age, representing chronic microvascular ischemic change. There are at least two chronic hemorrhagic lacunar infarcts in the LEFT hemisphere, affecting the caudate as well as the periventricular white matter. Unremarkable midline structures. Negative orbits. Chronic medial blowout fracture, RIGHT orbit. IMPRESSION: Large acute/subacute LEFT PCA territory infarct. Early hemorrhagic transformation without lobar hematoma. Additional acute infarction of the LEFT midbrain likely accounts for the reported RIGHT hemiparesis. Chronic microvascular ischemic change with areas of chronic lacunar hemorrhagic insult as described. Electronically Signed   By: Elsie Stain M.D.   On: 02/04/2016 20:17    Not all labs, radiology exams or other studies done during hospitalization come through on my EPIC note; however they are reviewed by me.    Assessment and Plan  Left PCA territory and midbrain infarct: CTA Head and Neck showed a large subacute infarct of the left PCA territory. The left posterior cerebral artery is occluded. MRI shows a large acute/subacute PCA territory infarct and acute left midbrain infarct. LE dopplers were negative for DVT. TTE  with EF 55-60%, no PFO. Neurology initially felt patients stroke was of embolic cause with unknown source. TEE was negative for embolic source. EP were consulted to evaluate for implantable loop recorder to assess for arrhythmia.This was placed on 6/26 without complication. Patient had continued deficit of right hemianopsia with right-sided hemiparesis. He had continued fluent aphasia which showed some improvement with speech therapy SNF -  discharged to outpatient rehab in stable condition; ASA 81 mg daily as prophylaxis  Elevated total protein: On admission, AST/ALT were 47/25 with mild increased bilirubin of 1.7 and total protein elevated at >12.0. UA showed proteinuria at 100 mg/dL. Repeat CMP normalized. HCV and HIV antibody were negative  Thyroid nodule: 2 cm right thyroid lesion was seen on CTA. TSH was slightly low at 0.318. Free T4 was 0.91, T3 was 2.9. Consider thyroid U/S and possible biopsy outpatient.  Type 2 diabetes: A1c 6.6 which is borderline and consistent with CBGs throughout his hospital stay of mid 100s to low  .SNF - diet and CBG checks; consider starting Metformin if not well controlled.  HLD (hyperlipidemia) SNF - LDL 112, HDL 36, pt was started on lipitor; cont lipitor 40 mg daily  Tobacco abuse SNF - Nicoderm patch was not listed as d/c med;will restart nicoderm patches  Alcohol use disorder (HCC) SNF - no reported withdrawal sx in hospital; pt not d/c on ETOH abuse specific meds; abstinence at SNF   Time spent > 45 min;> 50% of time with patient was spent reviewing records, labs, tests and studies, counseling and developing plan of care  Thurston Hole D. Lyn Hollingshead, MD

## 2016-02-19 ENCOUNTER — Ambulatory Visit: Payer: Self-pay

## 2016-02-19 ENCOUNTER — Ambulatory Visit (INDEPENDENT_AMBULATORY_CARE_PROVIDER_SITE_OTHER): Payer: Self-pay | Admitting: *Deleted

## 2016-02-19 DIAGNOSIS — I639 Cerebral infarction, unspecified: Secondary | ICD-10-CM

## 2016-02-19 DIAGNOSIS — Z95818 Presence of other cardiac implants and grafts: Secondary | ICD-10-CM

## 2016-02-19 LAB — CUP PACEART INCLINIC DEVICE CHECK: MDC IDC SESS DTM: 20170706125026

## 2016-02-19 NOTE — Progress Notes (Signed)
Loop wound check in clinic. No steri-strips in place. Incision edges approximated, wound well healed. No redness, swelling or [facility]. Patient educated about wound care.  Carelink monitor not connected at this time- patient reports family will know where monitor is. I have educated him to keep the monitor plugged in near his bedside, and I have written a verbal order for the facility to keep monitor plugged in at bedside at all times.  Battery status: good. R-waves 0.3948mV. No episodes recorded. [place]uston FoleyBrady and Pause detection turned off due to implantation for cryptogenic stroke. Monthly summary reports and ROV with [facility] in 12 months.

## 2016-02-20 ENCOUNTER — Ambulatory Visit: Payer: Self-pay

## 2016-02-27 ENCOUNTER — Telehealth: Payer: Self-pay | Admitting: Cardiology

## 2016-02-27 NOTE — Telephone Encounter (Signed)
Spoke w/ pt sister in law and requested that pt send a remote transmission w/ his home monitor b/c it's not updated in at least 14 days. Pt sister in law stated that she has never seen the home monitor. Gave pt sister in Administrator, [employer] service number so she can call and have a new monitor order if she doesn't find it. Pt sister in law verbalized understanding.

## 2016-02-29 ENCOUNTER — Encounter: Payer: Self-pay | Admitting: Internal Medicine

## 2016-02-29 DIAGNOSIS — R778 Other specified abnormalities of plasma proteins: Secondary | ICD-10-CM | POA: Insufficient documentation

## 2016-02-29 DIAGNOSIS — E041 Nontoxic single thyroid nodule: Secondary | ICD-10-CM | POA: Insufficient documentation

## 2016-02-29 NOTE — Assessment & Plan Note (Signed)
SNF - Nicoderm patch was not listed as d/c med;will restart nicoderm patches

## 2016-02-29 NOTE — Assessment & Plan Note (Addendum)
SNF - no reported withdrawal sx in hospital; pt not d/c on ETOH abuse specific meds; [facility]

## 2016-02-29 NOTE — Assessment & Plan Note (Signed)
SNF - LDL 112, HDL 36, pt was started on lipitor; cont lipitor 40 mg daily

## 2016-03-04 ENCOUNTER — Encounter: Payer: Self-pay | Admitting: Cardiology

## 2016-03-10 ENCOUNTER — Ambulatory Visit (INDEPENDENT_AMBULATORY_CARE_PROVIDER_SITE_OTHER): Payer: Medicaid Other | Admitting: *Deleted

## 2016-03-10 DIAGNOSIS — I639 Cerebral infarction, unspecified: Secondary | ICD-10-CM

## 2016-03-12 NOTE — Progress Notes (Signed)
Carelink Summary Report / Loop Recorder 

## 2016-03-15 ENCOUNTER — Encounter: Payer: Self-pay | Admitting: Internal Medicine

## 2016-03-15 ENCOUNTER — Non-Acute Institutional Stay (SKILLED_NURSING_FACILITY): Payer: Medicaid Other | Admitting: Internal Medicine

## 2016-03-15 DIAGNOSIS — E119 Type 2 diabetes mellitus without complications: Secondary | ICD-10-CM

## 2016-03-15 DIAGNOSIS — Z720 Tobacco use: Secondary | ICD-10-CM | POA: Diagnosis not present

## 2016-03-15 DIAGNOSIS — E785 Hyperlipidemia, unspecified: Secondary | ICD-10-CM

## 2016-03-15 DIAGNOSIS — E1169 Type 2 diabetes mellitus with other specified complication: Secondary | ICD-10-CM

## 2016-03-15 DIAGNOSIS — E669 Obesity, unspecified: Secondary | ICD-10-CM | POA: Diagnosis not present

## 2016-03-15 NOTE — Progress Notes (Signed)
MRN: [id number] Name: Ronald Forbes  Sex: male Age: 55 y.o. DOB: [birth year]  PSC #:  Facility/Room: [facility] /119 A Level Of Care: SNF Robb Sibal: Randon Goldsmith. Lyn Hollingshead, MD Emergency Contacts: Extended Emergency Contact Information Primary Emergency Contact: Radene Gunning [place] Home Phone: [phone] Mobile Phone: [phone] Relation: Brother Secondary Emergency Contact: Arbutus Ped [place] Mobile Phone: [phone] Relation: Other  Code Status: DNR  Allergies: Review of patient's allergies indicates no known allergies.  Chief Complaint  Patient presents with  . Medical Management of Chronic Issues    Routine Visit    HPI: Patient is 55 y.o. male who is being seen for routine issues of DM2, tobacco abuse and HLD.   Past Medical History:  Diagnosis Date  . Alcohol use disorder (HCC)   . Cerebral vascular accident (HCC)   . 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 Diabetes mellitus type 2 in obese (HCC)   . HLD (hyperlipidemia)   . Morbid obesity (HCC)   . Stroke (HCC)   . Tobacco abuse     Past Surgical History:  Procedure Laterality Date  . EP IMPLANTABLE DEVICE N/A 02/09/2016   Procedure: Loop Recorder Insertion;  Surgeon: Duke Salvia, MD;  Location: [facility] INVASIVE CV LAB;  Service: Cardiovascular;  Laterality: N/A;  . TEE WITHOUT CARDIOVERSION N/A 02/06/2016   Procedure: TRANSESOPHAGEAL ECHOCARDIOGRAM (TEE);  Surgeon: Wendall Stade, MD;  Location: [facility] ENDOSCOPY;  Service: Cardiovascular;  Laterality: N/A;      Medication List       Accurate as of 03/15/16 11:59 PM. Always use your most recent med list.          aspirin 81 MG chewable tablet Chew 1 tablet (81 mg total) by mouth daily.   atorvastatin 40 MG tablet Commonly known as:  LIPITOR Take 1 tablet (40 mg total) by mouth daily at 6 PM.   buPROPion 300 MG 24 hr tablet Commonly known as:  WELLBUTRIN XL Take  300 mg by mouth daily.   DECUBI-VITE PO Take 1 tablet by mouth daily.   ibuprofen 200 MG tablet Commonly known as:  ADVIL,MOTRIN Take 800 mg by mouth every 6 (six) hours as needed for mild pain.   traZODone 100 MG tablet Commonly known as:  DESYREL Take 100 mg by mouth at bedtime.       Meds ordered this encounter  Medications  . traZODone (DESYREL) 100 MG tablet    Sig: Take 100 mg by mouth at bedtime.  . Multiple Vitamins-Minerals (DECUBI-VITE PO)    Sig: Take 1 tablet by mouth daily.  Marland Kitchen buPROPion (WELLBUTRIN XL) 300 MG 24 hr tablet    Sig: Take 300 mg by mouth daily.     There is no immunization history on file for this patient.  Social History  Substance Use Topics  . Smoking status: Current Every Day Smoker    Packs/day: 1.00    Years: 40.00    Types: Cigarettes  . Smokeless tobacco: Not on file  . Alcohol use 0.0 oz/week     Comment: 6 pack/day    Review of Systems  DATA OBTAINED: from patient, nurse, medical record, family member GENERAL:  no fevers, fatigue, appetite changes SKIN: No itching, rash HEENT: No complaint RESPIRATORY: No cough, wheezing, SOB CARDIAC: No chest pain, palpitations, lower extremity edema  GI: No abdominal pain, No N/V/D or constipation, No heartburn or reflux  GU: No dysuria, frequency or urgency, or  incontinence  MUSCULOSKELETAL: No unrelieved bone/joint pain NEUROLOGIC: No headache, dizziness  PSYCHIATRIC: No overt anxiety or sadness  Vitals:   03/15/16 1341  BP: 133/67  Pulse: 67  Resp: 18  Temp: 98.7 F (37.1 C)    Physical Exam  GENERAL APPEARANCE: Alert, conversant, No acute distress  SKIN: No diaphoresis rash HEENT: Unremarkable RESPIRATORY: Breathing is even, unlabored. Lung sounds are clear   CARDIOVASCULAR: Heart RRR no murmurs, rubs or gallops. No peripheral edema  GASTROINTESTINAL: Abdomen is soft, non-tender, not distended w/ normal bowel sounds.  GENITOURINARY: Bladder non tender, not distended   MUSCULOSKELETAL: No abnormal joints or musculature NEUROLOGIC: Cranial nerves 2-12 grossly intact; R side weakness,dysarthria PSYCHIATRIC: Mood and affect appropriate to situation, no behavioral issues  Patient Active Problem List   Diagnosis Date Noted  . Thyroid nodule 02/29/2016  . 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 Cerebrovascular accident (CVA) due to embolism of left posterior cerebral artery (HCC)   . Diabetes mellitus type 2 in obese (HCC) 02/07/2016  . HLD (hyperlipidemia)   . Tobacco abuse 02/04/2016  . Alcohol use disorder (HCC) 02/04/2016  . Morbid obesity (HCC) 02/04/2016  . CVA (cerebral vascular accident) (HCC) 02/04/2016    CBC    Component Value Date/Time   WBC 10.1 02/08/2016 0535   RBC 4.86 02/08/2016 0535   HGB 15.5 02/08/2016 0535   HCT 44.7 02/08/2016 0535   PLT 161 02/08/2016 0535   MCV 92.0 02/08/2016 0535   LYMPHSABS 2.2 02/04/2016 0849   MONOABS 0.6 02/04/2016 0849   EOSABS 0.1 02/04/2016 0849   BASOSABS 0.0 02/04/2016 0849    CMP     Component Value Date/Time   NA 136 02/08/2016 0535   NA 136 (A) 02/04/2016   K 4.1 02/08/2016 0535   CL 103 02/08/2016 0535   CO2 24 02/08/2016 0535   GLUCOSE 139 (H) 02/08/2016 0535   BUN 13 02/08/2016 0535   BUN 15 02/04/2016   CREATININE 0.83 02/08/2016 0535   CALCIUM 9.1 02/08/2016 0535   PROT 7.1 02/05/2016 0001   ALBUMIN 3.9 02/05/2016 0001   AST 23 02/05/2016 0001   ALT 29 02/05/2016 0001   ALKPHOS 53 02/05/2016 0001   BILITOT 0.9 02/05/2016 0001   GFRNONAA >60 02/08/2016 0535   GFRAA >60 02/08/2016 0535    Assessment and Plan  TYPE 2 DM- A1c 6.6 which is borderline and consistent with CBGs throughout his hospital stay of mid 100s to low  SNF -Stable on diet ;cont CBG checks  HLD (hyperlipidemia) SNF - LDL 112, HDL 36, cont lipitor 40 mg daily; Will order FLP in another month  Tobacco abuse SNF - Nicoderm patch was not listed as d/c med; nicoderm patches were started and  cont    Thurston Hole D. Lyn Hollingshead, MD

## 2016-03-17 ENCOUNTER — Encounter: Payer: Self-pay | Admitting: Cardiology

## 2016-03-24 ENCOUNTER — Non-Acute Institutional Stay (SKILLED_NURSING_FACILITY): Payer: Medicaid Other | Admitting: Adult Health

## 2016-03-24 DIAGNOSIS — I63432 Cerebral infarction due to embolism of left posterior cerebral artery: Secondary | ICD-10-CM

## 2016-03-24 DIAGNOSIS — E1169 Type 2 diabetes mellitus with other specified complication: Secondary | ICD-10-CM

## 2016-03-24 DIAGNOSIS — E785 Hyperlipidemia, unspecified: Secondary | ICD-10-CM | POA: Diagnosis not present

## 2016-03-24 DIAGNOSIS — F1099 Alcohol use, unspecified with unspecified alcohol-induced disorder: Secondary | ICD-10-CM

## 2016-03-24 DIAGNOSIS — E669 Obesity, unspecified: Secondary | ICD-10-CM

## 2016-03-24 DIAGNOSIS — E119 Type 2 diabetes mellitus without complications: Secondary | ICD-10-CM | POA: Diagnosis not present

## 2016-03-24 DIAGNOSIS — IMO0002 Reserved for concepts with insufficient information to code with codable children: Secondary | ICD-10-CM

## 2016-03-26 LAB — CUP PACEART REMOTE DEVICE CHECK: Date Time Interrogation Session: 20170726153536

## 2016-04-09 ENCOUNTER — Ambulatory Visit: Payer: Self-pay | Admitting: Neurology

## 2016-04-09 ENCOUNTER — Encounter: Payer: Self-pay | Admitting: *Deleted

## 2016-04-18 ENCOUNTER — Encounter: Payer: Self-pay | Admitting: Internal Medicine

## 2016-04-26 NOTE — Progress Notes (Signed)
Location:      Place of Service:  SNF (31)    CODE STATUS: full code   No Known Allergies  Chief Complaint  Patient presents with  . Discharge Note    HPI:  He is being discharged to home with home health for pt/ot/rn. He will need a standard a wheelchair and a hemi walker. He will need his prescriptions to be written and will need to follow up with his primary care provider.   Past Medical History:  Diagnosis Date  . Alcohol use disorder (HCC)   . Cerebral vascular accident (HCC)   . 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 Diabetes mellitus type 2 in obese (HCC)   . HLD (hyperlipidemia)   . Morbid obesity (HCC)   . Stroke (HCC)   . Tobacco abuse     Past Surgical History:  Procedure Laterality Date  . EP IMPLANTABLE DEVICE N/A 02/09/2016   Procedure: Loop Recorder Insertion;  Surgeon: Duke SalviaSteven C Klein, MD;  [facility];  Service: Cardiovascular;  Laterality: N/A;  . TEE WITHOUT CARDIOVERSION N/A 02/06/2016   Procedure: TRANSESOPHAGEAL ECHOCARDIOGRAM (TEE);  Surgeon: Wendall StadePeter C Nishan, MD;  [facility];  Service: Cardiovascular;  Laterality: N/A;    Social History   Social History  . Marital status: Single    Spouse name: N/A  . Number of children: N/A  . Years of education: N/A   Occupational History  . Curatormechanic    Social History Main Topics  . Smoking status: Current Every Day Smoker    Packs/day: 1.00    Years: 40.00    Types: Cigarettes  . Smokeless tobacco: Not on file  . Alcohol use 0.0 oz/week     Comment: 6 pack/day  . Drug use:     Types: Marijuana     Comment: occasional marijuana  . Sexual activity: Not on file   Other Topics Concern  . Not on file   Social History Narrative   Lives alone   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   No family history on file.  VITAL SIGNS BP 133/67   Pulse 67   Ht 6\' 3"  (1.905 m)   Wt 253 lb (114.8 kg)   SpO2 98%   BMI 31.62 kg/m     Patient's Medications  New Prescriptions   No medications on file  Previous Medications   ASPIRIN 81 MG CHEWABLE TABLET    Chew 1 tablet (81 mg total) by mouth daily.   ATORVASTATIN (LIPITOR) 40 MG TABLET    Take 1 tablet (40 mg total) by mouth daily at 6 PM.   BUPROPION (WELLBUTRIN XL) 300 MG 24 HR TABLET    Take 300 mg by mouth daily.   IBUPROFEN (ADVIL,MOTRIN) 200 MG TABLET    Take 800 mg by mouth every 6 (six) hours as needed for mild pain.   MULTIPLE VITAMINS-MINERALS (DECUBI-VITE PO)    Take 1 tablet by mouth daily.   TRAZODONE (DESYREL) 100 MG TABLET    Take 100 mg by mouth at bedtime.  Modified Medications   No medications on file  Discontinued Medications   No medications on file     SIGNIFICANT DIAGNOSTIC EXAMS   Review of Systems  Constitutional: Negative for malaise/fatigue.  Respiratory: Negative for cough and shortness of breath.   Cardiovascular: Negative for chest pain, palpitations and leg swelling.  Gastrointestinal: Negative for abdominal pain, constipation and heartburn.  Musculoskeletal: Negative  for back pain, joint pain and myalgias.  Skin: Negative.   Neurological: Negative for dizziness.  Psychiatric/Behavioral: The patient is not nervous/anxious.     Physical Exam  Constitutional: No distress.  Eyes: Conjunctivae are normal.  Neck: Neck supple. No JVD present. No thyromegaly present.  Cardiovascular: Normal rate, regular rhythm and intact distal pulses.   Respiratory: Effort normal and breath sounds normal. No respiratory distress. He has no wheezes.  GI: Soft. Bowel sounds are normal. He exhibits no distension. There is no tenderness.  Musculoskeletal: He exhibits no edema.  Able to move all extremities  Right side weakness   Lymphadenopathy:    He has no cervical adenopathy.  Neurological: He is alert.  Skin: Skin is warm and dry. He is not diaphoretic.  Psychiatric: He has a normal mood and affect.     ASSESSMENT/ PLAN:   Patient is  being discharged with the following home health services:  Pt/ot/rn: to evaluate and treat as indicated for gait; balance; strength; adl training  Patient is being discharged with the following durable medical equipment:  Standard wheelchair with cushion; leg rests; brake extensions; anti-tippers; in order for him to maintain his current level of independence with his adls; which cannot be achieved with a standard walkers. Hemi walker   Patient has been advised to f/u with their PCP in 1-2 weeks to bring them up to date on their rehab stay.  Social services at facility was responsible for arranging this appointment.  Pt was provided with a 30 day supply of prescriptions for medications and refills must be obtained from their PCP.  For controlled substances, a more limited supply may be provided adequate until PCP appointment only.   Time spent with patient   40   minutes >50% time spent counseling; reviewing medical record; tests; labs; and developing future plan of care    Synthia Innocent NP [facility]  Contact [phone] Monday through Friday 8am- 5pm  After hours call [phone]

## 2016-05-21 ENCOUNTER — Emergency Department (HOSPITAL_COMMUNITY)
Admission: EM | Admit: 2016-05-21 | Discharge: 2016-05-22 | Disposition: A | Discharge status: Home / Self Care | Payer: Medicaid Other | Attending: Emergency Medicine | Admitting: Emergency Medicine

## 2016-05-21 ENCOUNTER — Encounter (HOSPITAL_COMMUNITY): Payer: Self-pay | Admitting: Emergency Medicine

## 2016-05-21 DIAGNOSIS — M79601 Pain in right arm: Secondary | ICD-10-CM

## 2016-05-21 DIAGNOSIS — F17210 Nicotine dependence, cigarettes, uncomplicated: Secondary | ICD-10-CM | POA: Insufficient documentation

## 2016-05-21 DIAGNOSIS — M79604 Pain in right leg: Secondary | ICD-10-CM | POA: Diagnosis not present

## 2016-05-21 DIAGNOSIS — E119 Type 2 diabetes mellitus without complications: Secondary | ICD-10-CM | POA: Insufficient documentation

## 2016-05-21 DIAGNOSIS — Z7982 Long term (current) use of aspirin: Secondary | ICD-10-CM | POA: Diagnosis not present

## 2016-05-21 DIAGNOSIS — Z79899 Other long term (current) drug therapy: Secondary | ICD-10-CM | POA: Insufficient documentation

## 2016-05-21 DIAGNOSIS — R930 Abnormal findings on diagnostic imaging of skull and head, not elsewhere classified: Secondary | ICD-10-CM | POA: Diagnosis not present

## 2016-05-21 DIAGNOSIS — Z8673 Personal history of transient ischemic attack (TIA), and cerebral infarction without residual deficits: Secondary | ICD-10-CM | POA: Diagnosis not present

## 2016-05-21 LAB — CBC
HEMATOCRIT: 41.7 % (ref 39.0–52.0)
Hemoglobin: 14.5 g/dL (ref 13.0–17.0)
MCH: 33 pg (ref 26.0–34.0)
MCHC: 34.8 g/dL (ref 30.0–36.0)
MCV: 94.8 fL (ref 78.0–100.0)
Platelets: 138 10*3/uL — ABNORMAL LOW (ref 150–400)
RBC: 4.4 MIL/uL (ref 4.22–5.81)
RDW: 13.7 % (ref 11.5–15.5)
WBC: 6.7 10*3/uL (ref 4.0–10.5)

## 2016-05-21 LAB — BASIC METABOLIC PANEL
ANION GAP: 9 (ref 5–15)
BUN: 11 mg/dL (ref 6–20)
CHLORIDE: 104 mmol/L (ref 101–111)
CO2: 23 mmol/L (ref 22–32)
Calcium: 8.8 mg/dL — ABNORMAL LOW (ref 8.9–10.3)
Creatinine, Ser: 0.8 mg/dL (ref 0.61–1.24)
GFR calc non Af Amer: >60 mL/min (ref >60)
Glucose, Bld: 117 mg/dL — ABNORMAL HIGH (ref 65–99)
POTASSIUM: 3.6 mmol/L (ref 3.5–5.1)
SODIUM: 136 mmol/L (ref 135–145)

## 2016-05-21 LAB — URINALYSIS, ROUTINE W REFLEX MICROSCOPIC
Bilirubin Urine: NEGATIVE
GLUCOSE, UA: NEGATIVE mg/dL
Hgb urine dipstick: NEGATIVE
Ketones, ur: NEGATIVE mg/dL
Nitrite: NEGATIVE
PH: 6 (ref 5.0–8.0)
PROTEIN: 30 mg/dL — AB
Specific Gravity, Urine: >1.03 — ABNORMAL HIGH (ref 1.005–1.030)

## 2016-05-21 LAB — URINE MICROSCOPIC-ADD ON: RBC / HPF: NONE SEEN RBC/hpf (ref 0–5)

## 2016-05-21 LAB — CBG MONITORING, ED: Glucose-Capillary: 119 mg/dL — ABNORMAL HIGH (ref 65–99)

## 2016-05-21 MED ORDER — OXYCODONE-ACETAMINOPHEN 5-325 MG PO TABS
1.0000 | ORAL_TABLET | ORAL | Status: DC | PRN
  Administered 2016-05-21: 1 via ORAL
  Filled 2016-05-21: qty 1

## 2016-05-21 NOTE — ED Triage Notes (Addendum)
Pt arrives via EMS from home with c/o R arm and R leg pain and increased weakness ongoing x2 . Non compliance with physical therapy and HTN meds, states hasn't had them in a month - states he cannot afford them.  BP with EMS162/92  Hx of stroke in May with R arm and leg weakness residual.  Pt given narcotic pain medicine in triage. Advised of side effects and instructed to avoid driving for a minimum of four hours.

## 2016-05-22 ENCOUNTER — Emergency Department (HOSPITAL_COMMUNITY): Payer: Medicaid Other

## 2016-05-22 LAB — RAPID URINE DRUG SCREEN, HOSP PERFORMED
AMPHETAMINES: NOT DETECTED
BARBITURATES: NOT DETECTED
BENZODIAZEPINES: NOT DETECTED
Cocaine: NOT DETECTED
Opiates: NOT DETECTED
TETRAHYDROCANNABINOL: NOT DETECTED

## 2016-05-22 LAB — ETHANOL

## 2016-05-22 MED ORDER — OXYCODONE-ACETAMINOPHEN 5-325 MG PO TABS: 1.0000 | ORAL_TABLET | ORAL | 0 refills | Status: DC | PRN

## 2016-05-22 MED ORDER — OXYCODONE-ACETAMINOPHEN 5-325 MG PO TABS
1.0000 | ORAL_TABLET | Freq: Once | ORAL | Status: AC
  Administered 2016-05-22: 1 via ORAL
  Filled 2016-05-22: qty 1

## 2016-05-22 MED ORDER — FENTANYL CITRATE (PF) 100 MCG/2ML IJ SOLN
100.0000 ug | Freq: Once | INTRAMUSCULAR | Status: AC
  Administered 2016-05-22: 100 ug via INTRAVENOUS
  Filled 2016-05-22: qty 2

## 2016-05-22 MED ORDER — ASPIRIN 81 MG PO CHEW: 81.0000 mg | CHEWABLE_TABLET | Freq: Every day | ORAL | 0 refills | Status: DC

## 2016-05-22 MED ORDER — ATORVASTATIN CALCIUM 40 MG PO TABS: 40.0000 mg | ORAL_TABLET | Freq: Every day | ORAL | 0 refills | Status: DC

## 2016-05-22 NOTE — Care Management Note (Signed)
Case Management Note  Patient Details  Name: Laverle HobbyKevin Mckeag MRN: [id number] Date of Birth: 28-Dec-1960  Subjective/Objective:   CM received consult for this 55 y.o. M who is being seen in the ED today for c/o R arm /leg weakness and pain. After reviewing the chart this appears to be an ongoing issue since his admission with [facility] 02/2016. Discharged to SNF from this hospital. Discharged from SNF with HHPT/OT/RN  With which he has been "noncompliant" pt does have a follow-up  Neurology appt scheduled with Roda ShuttersXu, MD on 06/04/2016. Pt tells me the address on file is that of his brother and [place] Agrees to go to [facility] to obtain a PCP and Navigator as well as, assistance with medications al though I see no evidence of Antihypertensives on his MAR. Most of the meds on his list can be purchased OTC or on the $4.00 list at [facility].                Action/Plan: Discaharge home/self care   Expected Discharge Date:                  Expected Discharge Plan:  Home/Self Care  In-House Referral:  PCP / [facility]  Discharge planning Services  CM Consult, Medication Assistance, Indigent Health Clinic  Post Acute Care Choice:  NA Choice offered to:  Patient  DME Arranged:  N/A (Has W/C and Hemi Walker from SNF discharge approx 03/13/2016) DME Agency:     HH Arranged:  PT, OT, RN (Noncompliant with these Therapies post Discharge from SNF) [facility] Agency:     Status of Service:  Completed, signed off  If discussed at Long Length of Stay Meetings, dates discussed:    Additional Comments: [facility] during Open Clinic Hours Thursday 05/27/2016 08:30. Do not leave until you are seen. Ask for PCP, ED visit follow up, Navigator and Assist with Medications and Insurance. Attend you follow up with Xu 06/04/2016.   Shawnya Mayor, Derrill MemoGenese M, RN 05/22/2016, 10:02 AM

## 2016-05-22 NOTE — ED Provider Notes (Signed)
[facility]-EM[facility]Y DEPT Provider Note   CSN: [id number] Arrival date & time: 05/21/16  2106  History   Chief Complaint Chief Complaint  Patient presents with  . Leg Pain  . Arm Pain    HPI Ronald Forbes is a 55 y.o. male.  HPI  Patient to the ER with complaints of ETOH disorder, CVA, diabetes, HLD, morbid, stroke, tobacco use.  Patient comes to the ER by EMS with  Complaints of R arm and leg pain. He says that he had an exacerbation of his weakness starting two weeks ago, the pain started 5 days ago. He says that he hasnt been drinking the past two weeks but he believes eating pigs feet and drinking alcohol last month is what caused this episode. He does not know why he is having this pain but says he sometimes does have this. He has difficulty finding his words and says it has been this way since his previous stroke. He has not had any CP, SOB, N/V/D, confusion, back pain, dysuria. He says that he lives independently and is able to walk and get around but is having to use a walker when last month he was able to walk without it.  The patient is supposed to be doing physical therapy and taking blood pressure medications but he says that he cant afford them. The patient has residual weakness from the stroke July 2017, the patient was given pain medication in triage.  Past Medical History:  Diagnosis Date  . Alcohol use disorder (HCC)   . Cerebral vascular accident (HCC)   . 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 Diabetes mellitus type 2 in obese (HCC)   . HLD (hyperlipidemia)   . Morbid obesity (HCC)   . Stroke (HCC)   . Tobacco abuse     Patient Active Problem List   Diagnosis Date Noted  . Thyroid nodule 02/29/2016  . 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 Cerebrovascular accident (CVA) due to embolism of left posterior cerebral artery (HCC)   . Diabetes mellitus type 2 in obese (HCC) 02/07/2016  . HLD (hyperlipidemia)   .  Tobacco abuse 02/04/2016  . Alcohol use disorder (HCC) 02/04/2016  . Morbid obesity (HCC) 02/04/2016  . CVA (cerebral vascular accident) (HCC) 02/04/2016    Past Surgical History:  Procedure Laterality Date  . EP IMPLANTABLE DEVICE N/A 02/09/2016   Procedure: Loop Recorder Insertion;  Surgeon: Duke Salvia, MD;  Location: [facility] INVASIVE CV LAB;  Service: Cardiovascular;  Laterality: N/A;  . TEE WITHOUT CARDIOVERSION N/A 02/06/2016   Procedure: TRANSESOPHAGEAL ECHOCARDIOGRAM (TEE);  Surgeon: Wendall Stade, MD;  Location: [facility] ENDOSCOPY;  Service: Cardiovascular;  Laterality: N/A;       Home Medications    Prior to Admission medications   Medication Sig Start Date End Date Taking? Authorizing Provider  aspirin 81 MG chewable tablet Chew 1 tablet (81 mg total) by mouth daily. 02/11/16   Darreld Mclean, MD  atorvastatin (LIPITOR) 40 MG tablet Take 1 tablet (40 mg total) by mouth daily at 6 PM. 02/10/16   Darreld Mclean, MD  buPROPion (WELLBUTRIN XL) 300 MG 24 hr tablet Take 300 mg by mouth daily.    Historical Provider, MD  ibuprofen (ADVIL,MOTRIN) 200 MG tablet Take 800 mg by mouth every 6 (six) hours as needed for mild pain.    Historical Provider, MD  Multiple Vitamins-Minerals (DECUBI-VITE PO) Take 1 tablet by mouth daily.    Historical Provider, MD  traZODone (DESYREL) 100 MG tablet Take 100 mg by mouth at bedtime.    Historical Provider, MD    Family History No family history on file.  Social History Social History  Substance Use Topics  . Smoking status: Current Every Day Smoker    Packs/day: 1.00    Years: 40.00    Types: Cigarettes  . Smokeless tobacco: Never Used  . Alcohol use 0.0 oz/week     Comment: 6 pack/day     Allergies   Review of patient's allergies indicates no known allergies.   Review of Systems Review of Systems Review of Systems All other systems negative except as documented in the HPI. All pertinent positives and negatives as reviewed in the  HPI.   Physical Exam Updated Vital Signs BP 137/89 (BP Location: Right Arm)   Pulse 73   Temp 98.2 F (36.8 C) (Oral)   Resp 18   Ht 6\' 3"  (1.905 m)   Wt 113.4 kg   SpO2 96%   BMI 31.25 kg/m   Physical Exam  Constitutional: He appears well-developed and well-nourished. No distress.  HENT:  Head: Normocephalic and atraumatic.  Right Ear: Tympanic membrane and ear canal normal.  Left Ear: Tympanic membrane and ear canal normal.  Nose: Nose normal.  Mouth/Throat: Uvula is midline, oropharynx is clear and moist and mucous membranes are normal.  Eyes: Pupils are equal, round, and reactive to light.  Neck: Normal range of motion. Neck supple.  Cardiovascular: Normal rate and regular rhythm.   Pulmonary/Chest: Effort normal.  Abdominal: Soft.  No signs of abdominal distention  Musculoskeletal:  No LE swelling  Neurological: He is alert.  Patient is having weakness to the right leg 3/5 and right arm 3/5. He has intact sensation, the limbs are warm. He has difficulty finding his words and slowed speech.  Skin: Skin is warm and dry. No rash noted.  Nursing note and vitals reviewed.    ED Treatments / Results  Labs (all labs ordered are listed, but only abnormal results are displayed) Labs Reviewed  BASIC METABOLIC PANEL - Abnormal; Notable for the following:       Result Value   Glucose, Bld 117 (*)    Calcium 8.8 (*)    All other components within normal limits  CBC - Abnormal; Notable for the following:    Platelets 138 (*)    All other components within normal limits  URINALYSIS, ROUTINE W REFLEX MICROSCOPIC (NOT AT [facility]) - Abnormal; Notable for the following:    Specific Gravity, Urine >1.030 (*)    Protein, ur 30 (*)    Leukocytes, UA TRACE (*)    All other components within normal limits  URINE MICROSCOPIC-ADD ON - Abnormal; Notable for the following:    Squamous Epithelial / LPF 0-5 (*)    Bacteria, UA RARE (*)    All other components within normal limits   CBG MONITORING, ED - Abnormal; Notable for the following:    Glucose-Capillary 119 (*)    All other components within normal limits    EKG  EKG Interpretation None       Radiology No results found.  Procedures Procedures (including critical care time)  Medications Ordered in ED Medications  oxyCODONE-acetaminophen (PERCOCET/ROXICET) 5-325 MG per tablet 1 tablet (1 tablet Oral Given 05/21/16 2125)     Initial Impression / Assessment and Plan / ED Course  I have reviewed the triage vital signs and the nursing notes.  Pertinent labs & imaging results that were  available during my care of the patient were reviewed by me and considered in my medical decision making (see chart for details).  Clinical Course    Case discussed with Dr. Roseanne Reno, after a normal Head CT he recommends MRI brain wo contrast to r/o stroke, since the patient is ambulatory, if MRI is negative he can be discharged with outpatient follow-up will obtain Case Manager follow-up for patient since he is unable to afford medications and physical therapy.   6: 40 am - pt still waiting to Forbes to MRI. If MRI shows no new findings patient has been ambulatory and can be discharged. I recommend Case Manager consult first before DC to help obtain BP medications and Physical Therapy as recommended by neuro. At end of shift patient sign out to [employer], PA-C.  Final Clinical Impressions(s) / ED Diagnoses   Final diagnoses:  None    New Prescriptions New Prescriptions   No medications on file     Marlon Pel, Cordelia Poche 05/22/16 0640    Lorre Nick, MD 05/23/16 [phone]

## 2016-05-22 NOTE — Discharge Instructions (Signed)
Medications: Percocet, Lipitor, aspirin  Treatment: Take 1-2 Percocet every 4-6 hours as needed for your pain. Do not drive or operate machinery when taking this medication. Only take as prescribed and do not take with Tylenol. Take Lipitor as prescribed. Take aspirin as prescribed.  Follow-up: Please follow-up at the appointments listed below. It is important that you go to these appointments. Please make your doctor aware at the [facility] of your previous treatment with blood pressure medication so that they can further evaluate this. Please return to emergency department if you develop any new or worsening symptoms.

## 2016-05-22 NOTE — ED Notes (Signed)
Patient transported to CT 

## 2016-05-22 NOTE — ED Provider Notes (Signed)
Sign out from Ronald Peliffany Greene, PA-C  History of stroke, July 2017, R arm and R leg dficits; able to walk well without a walker; weakness again getting worse; ambulating with walker; 1 week ago began with pain; Head CT shows no acute finding; MRI pending. Not going to PT or taking his BP meds. D/C if MRI shows no new stroke, admit if new findings. Consult case management.  MRI shows no acute findings. Case management arrange for [facility] to [facility] for open clinic. Case management or I could not find history of patient's blood pressure medication and patient's blood pressure is in normal range today. Advised patient to have this reassessed at [facility]. I discharged patient home with short course of Percocet, refill of his Lipitor, and aspirin, as Lipitor and aspirin are also vital to his stroke prevention. Patient vitals stable throughout ED course and discharged in satisfactory condition. Return precautions discussed. Patient is distended and agrees with plan.   Ronald Holeslexandra M Jerrod Damiano, PA-C 05/22/16 1201    Lorre NickAnthony Allen, MD 05/23/16 [phone]

## 2016-05-22 NOTE — ED Notes (Signed)
Pt. Denies bus pass home, pt. States he has no money for cab ride. Pt. Denies knowing address or contact number for family. Pt. States he no longer lives at the [place] address we have on file. Case management contacted and working on transportation.   Pt. Verbalized understanding of teaching and medication cost. NAD VSS

## 2016-05-22 NOTE — ED Notes (Signed)
Pt leaving department via PTAR at this time. NAD. Given Malaw[place]key sandwich and coke prior to leaving. A/o x4.

## 2016-05-22 NOTE — ED Notes (Signed)
Pt remains in MRI at this time  

## 2016-05-22 NOTE — Care Management Note (Signed)
Case Management Note  Patient Details  Name: Ronald Forbes MRN: [id number] Date of Birth: Jul 21, 1961  Subjective/Objective:  CM spoke with CSW, [name] to see if Taxi voucher is an option for this pt as he was unable to bring Hemi-walker with him [facility]. She reports Liability issues as pt might be unable to ambulate safely when transported to home. CM made EDRN aware that [employer] would be pts only option for assistance in transportation. Pt did say he could call family at 16:00 after they completed work. No further CM needs at this time.                   Action/Plan:   Expected Discharge Date:                  Expected Discharge Plan:  Home/Self Care  In-House Referral:  PCP / [facility]  Discharge planning Services  CM Consult, Medication Assistance, Indigent Health Clinic  Post Acute Care Choice:  NA Choice offered to:  Patient  DME Arranged:  N/A (Has W/C and Hemi Walker from SNF discharge approx 03/13/2016) DME Agency:     HH Arranged:  PT, OT, RN (Noncompliant with these Therapies post Discharge from SNF) [facility] Agency:     Status of Service:  Completed, signed off  If discussed at Long Length of Stay Meetings, dates discussed:    Additional Comments:  Yvone NeuCrutchfield, Javi Bollman M, RN 05/22/2016, 11:59 AM

## 2016-05-22 NOTE — ED Notes (Signed)
Pt. Back from MRI

## 2016-06-04 ENCOUNTER — Ambulatory Visit: Payer: MEDICAID | Admitting: Neurology

## 2016-08-24 ENCOUNTER — Ambulatory Visit: Payer: Self-pay | Admitting: Neurology

## 2016-09-21 ENCOUNTER — Other Ambulatory Visit: Payer: Self-pay | Admitting: Internal Medicine

## 2016-10-20 ENCOUNTER — Emergency Department (HOSPITAL_COMMUNITY)
Admit: 2016-10-20 | Discharge: 2016-10-20 | Disposition: A | Discharge status: Home / Self Care | Payer: Medicaid Other | Attending: Emergency Medicine | Admitting: Emergency Medicine

## 2016-10-20 ENCOUNTER — Encounter (HOSPITAL_COMMUNITY): Payer: Self-pay

## 2016-10-20 ENCOUNTER — Inpatient Hospital Stay (HOSPITAL_COMMUNITY)
Admission: EM | Admit: 2016-10-20 | Discharge: 2016-10-28 | DRG: 253 | Disposition: A | Discharge status: Rehabilitation Facility | Payer: Medicaid Other | Attending: Vascular Surgery | Admitting: Vascular Surgery

## 2016-10-20 ENCOUNTER — Encounter (HOSPITAL_COMMUNITY)
Admission: EM | Disposition: A | Discharge status: Rehabilitation Facility | Payer: Self-pay | Source: Home / Self Care | Attending: Vascular Surgery

## 2016-10-20 DIAGNOSIS — E876 Hypokalemia: Secondary | ICD-10-CM | POA: Diagnosis not present

## 2016-10-20 DIAGNOSIS — I69359 Hemiplegia and hemiparesis following cerebral infarction affecting unspecified side: Secondary | ICD-10-CM | POA: Diagnosis not present

## 2016-10-20 DIAGNOSIS — Z6831 Body mass index (BMI) 31.0-31.9, adult: Secondary | ICD-10-CM | POA: Diagnosis not present

## 2016-10-20 DIAGNOSIS — I998 Other disorder of circulatory system: Secondary | ICD-10-CM | POA: Diagnosis not present

## 2016-10-20 DIAGNOSIS — M7989 Other specified soft tissue disorders: Secondary | ICD-10-CM

## 2016-10-20 DIAGNOSIS — E1142 Type 2 diabetes mellitus with diabetic polyneuropathy: Secondary | ICD-10-CM | POA: Diagnosis not present

## 2016-10-20 DIAGNOSIS — Z79899 Other long term (current) drug therapy: Secondary | ICD-10-CM | POA: Diagnosis not present

## 2016-10-20 DIAGNOSIS — E46 Unspecified protein-calorie malnutrition: Secondary | ICD-10-CM | POA: Diagnosis not present

## 2016-10-20 DIAGNOSIS — M79609 Pain in unspecified limb: Secondary | ICD-10-CM

## 2016-10-20 DIAGNOSIS — Z9114 Patient's other noncompliance with medication regimen: Secondary | ICD-10-CM

## 2016-10-20 DIAGNOSIS — I70201 Unspecified atherosclerosis of native arteries of extremities, right leg: Secondary | ICD-10-CM | POA: Diagnosis present

## 2016-10-20 DIAGNOSIS — I69351 Hemiplegia and hemiparesis following cerebral infarction affecting right dominant side: Secondary | ICD-10-CM

## 2016-10-20 DIAGNOSIS — I739 Peripheral vascular disease, unspecified: Secondary | ICD-10-CM

## 2016-10-20 DIAGNOSIS — F17210 Nicotine dependence, cigarettes, uncomplicated: Secondary | ICD-10-CM | POA: Diagnosis present

## 2016-10-20 DIAGNOSIS — F17200 Nicotine dependence, unspecified, uncomplicated: Secondary | ICD-10-CM | POA: Diagnosis present

## 2016-10-20 DIAGNOSIS — E785 Hyperlipidemia, unspecified: Secondary | ICD-10-CM | POA: Diagnosis present

## 2016-10-20 DIAGNOSIS — F1010 Alcohol abuse, uncomplicated: Secondary | ICD-10-CM | POA: Diagnosis present

## 2016-10-20 DIAGNOSIS — I10 Essential (primary) hypertension: Secondary | ICD-10-CM | POA: Diagnosis not present

## 2016-10-20 DIAGNOSIS — G8191 Hemiplegia, unspecified affecting right dominant side: Secondary | ICD-10-CM | POA: Diagnosis not present

## 2016-10-20 DIAGNOSIS — M79671 Pain in right foot: Secondary | ICD-10-CM

## 2016-10-20 DIAGNOSIS — Z95828 Presence of other vascular implants and grafts: Secondary | ICD-10-CM

## 2016-10-20 DIAGNOSIS — M109 Gout, unspecified: Secondary | ICD-10-CM | POA: Diagnosis present

## 2016-10-20 DIAGNOSIS — Z419 Encounter for procedure for purposes other than remedying health state, unspecified: Secondary | ICD-10-CM

## 2016-10-20 DIAGNOSIS — Z01810 Encounter for preprocedural cardiovascular examination: Secondary | ICD-10-CM | POA: Diagnosis not present

## 2016-10-20 DIAGNOSIS — E1151 Type 2 diabetes mellitus with diabetic peripheral angiopathy without gangrene: Secondary | ICD-10-CM | POA: Diagnosis present

## 2016-10-20 DIAGNOSIS — Z7982 Long term (current) use of aspirin: Secondary | ICD-10-CM

## 2016-10-20 DIAGNOSIS — R5381 Other malaise: Secondary | ICD-10-CM | POA: Diagnosis not present

## 2016-10-20 DIAGNOSIS — I70221 Atherosclerosis of native arteries of extremities with rest pain, right leg: Secondary | ICD-10-CM

## 2016-10-20 DIAGNOSIS — D62 Acute posthemorrhagic anemia: Secondary | ICD-10-CM | POA: Diagnosis not present

## 2016-10-20 DIAGNOSIS — I69920 Aphasia following unspecified cerebrovascular disease: Secondary | ICD-10-CM | POA: Diagnosis not present

## 2016-10-20 HISTORY — PX: ABDOMINAL AORTOGRAM W/LOWER EXTREMITY: CATH118223

## 2016-10-20 HISTORY — DX: Peripheral vascular disease, unspecified: I73.9

## 2016-10-20 LAB — CBC
HEMATOCRIT: 40 % (ref 39.0–52.0)
HEMOGLOBIN: 13.7 g/dL (ref 13.0–17.0)
MCH: 30.9 pg (ref 26.0–34.0)
MCHC: 34.3 g/dL (ref 30.0–36.0)
MCV: 90.3 fL (ref 78.0–100.0)
Platelets: 155 10*3/uL (ref 150–400)
RBC: 4.43 MIL/uL (ref 4.22–5.81)
RDW: 13.3 % (ref 11.5–15.5)
WBC: 8.1 10*3/uL (ref 4.0–10.5)

## 2016-10-20 LAB — CBC WITH DIFFERENTIAL/PLATELET
BASOS ABS: 0 10*3/uL (ref 0.0–0.1)
BASOS PCT: 0 %
Eosinophils Absolute: 0.1 10*3/uL (ref 0.0–0.7)
Eosinophils Relative: 1 %
HEMATOCRIT: 42.4 % (ref 39.0–52.0)
HEMOGLOBIN: 14.7 g/dL (ref 13.0–17.0)
Lymphocytes Relative: 34 %
Lymphs Abs: 2.7 10*3/uL (ref 0.7–4.0)
MCH: 31.3 pg (ref 26.0–34.0)
MCHC: 34.7 g/dL (ref 30.0–36.0)
MCV: 90.4 fL (ref 78.0–100.0)
Monocytes Absolute: 0.5 10*3/uL (ref 0.1–1.0)
Monocytes Relative: 7 %
NEUTROS ABS: 4.5 10*3/uL (ref 1.7–7.7)
NEUTROS PCT: 58 %
Platelets: 160 10*3/uL (ref 150–400)
RBC: 4.69 MIL/uL (ref 4.22–5.81)
RDW: 13.4 % (ref 11.5–15.5)
WBC: 7.8 10*3/uL (ref 4.0–10.5)

## 2016-10-20 LAB — BASIC METABOLIC PANEL
Anion gap: 9 (ref 5–15)
BUN: 13 mg/dL (ref 6–20)
CHLORIDE: 104 mmol/L (ref 101–111)
CO2: 26 mmol/L (ref 22–32)
Calcium: 9.4 mg/dL (ref 8.9–10.3)
Creatinine, Ser: 0.82 mg/dL (ref 0.61–1.24)
GFR calc Af Amer: >60 mL/min (ref >60)
Glucose, Bld: 105 mg/dL — ABNORMAL HIGH (ref 65–99)
POTASSIUM: 4.1 mmol/L (ref 3.5–5.1)
Sodium: 139 mmol/L (ref 135–145)

## 2016-10-20 LAB — CREATININE, SERUM
Creatinine, Ser: 0.83 mg/dL (ref 0.61–1.24)
GFR calc non Af Amer: >60 mL/min (ref >60)

## 2016-10-20 SURGERY — ABDOMINAL AORTOGRAM W/LOWER EXTREMITY
Anesthesia: LOCAL | Laterality: Right

## 2016-10-20 MED ORDER — ASPIRIN 81 MG PO CHEW
81.0000 mg | CHEWABLE_TABLET | Freq: Every day | ORAL | Status: DC
  Administered 2016-10-21 – 2016-10-28 (×6): 81 mg via ORAL
  Filled 2016-10-20 (×8): qty 1

## 2016-10-20 MED ORDER — MORPHINE SULFATE (PF) 4 MG/ML IV SOLN
4.0000 mg | Freq: Once | INTRAVENOUS | Status: AC
  Administered 2016-10-20: 4 mg via INTRAVENOUS
  Filled 2016-10-20: qty 1

## 2016-10-20 MED ORDER — LABETALOL HCL 5 MG/ML IV SOLN
20.0000 mg | Freq: Once | INTRAVENOUS | Status: AC
Start: 2016-10-20 — End: 2016-10-20
  Administered 2016-10-20: 20 mg via INTRAVENOUS

## 2016-10-20 MED ORDER — HEPARIN SODIUM (PORCINE) 5000 UNIT/ML IJ SOLN
5000.0000 [IU] | Freq: Three times a day (TID) | INTRAMUSCULAR | Status: DC
  Administered 2016-10-20 – 2016-10-25 (×14): 5000 [IU] via SUBCUTANEOUS
  Filled 2016-10-20 (×14): qty 1

## 2016-10-20 MED ORDER — IODIXANOL 320 MG/ML IV SOLN
INTRAVENOUS | Status: DC | PRN
  Administered 2016-10-20: 55 mL via INTRA_ARTERIAL

## 2016-10-20 MED ORDER — LIDOCAINE HCL (PF) 1 % IJ SOLN
INTRAMUSCULAR | Status: DC | PRN
  Administered 2016-10-20: 15 mL via SUBCUTANEOUS

## 2016-10-20 MED ORDER — ATORVASTATIN CALCIUM 40 MG PO TABS
40.0000 mg | ORAL_TABLET | Freq: Every day | ORAL | Status: DC
  Administered 2016-10-20 – 2016-10-27 (×8): 40 mg via ORAL
  Filled 2016-10-20 (×8): qty 1

## 2016-10-20 MED ORDER — ONDANSETRON HCL 4 MG/2ML IJ SOLN: 4.0000 mg | Freq: Four times a day (QID) | INTRAMUSCULAR | Status: DC | PRN

## 2016-10-20 MED ORDER — HEPARIN (PORCINE) IN NACL 2-0.9 UNIT/ML-% IJ SOLN
INTRAMUSCULAR | Status: AC
  Filled 2016-10-20: qty 1000

## 2016-10-20 MED ORDER — LABETALOL HCL 5 MG/ML IV SOLN: 10.0000 mg | INTRAVENOUS | Status: DC | PRN

## 2016-10-20 MED ORDER — MIDAZOLAM HCL 2 MG/2ML IJ SOLN
INTRAMUSCULAR | Status: AC
  Filled 2016-10-20: qty 2

## 2016-10-20 MED ORDER — LABETALOL HCL 5 MG/ML IV SOLN
INTRAVENOUS | Status: AC
  Filled 2016-10-20: qty 4

## 2016-10-20 MED ORDER — KETOROLAC TROMETHAMINE 30 MG/ML IJ SOLN
15.0000 mg | Freq: Once | INTRAMUSCULAR | Status: AC
  Administered 2016-10-20: 15 mg via INTRAVENOUS
  Filled 2016-10-20: qty 1

## 2016-10-20 MED ORDER — MIDAZOLAM HCL 2 MG/2ML IJ SOLN
INTRAMUSCULAR | Status: DC | PRN
  Administered 2016-10-20: 1 mg via INTRAVENOUS

## 2016-10-20 MED ORDER — OXYCODONE-ACETAMINOPHEN 5-325 MG PO TABS
1.0000 | ORAL_TABLET | ORAL | Status: DC | PRN
  Administered 2016-10-20 – 2016-10-22 (×11): 2 via ORAL
  Administered 2016-10-23: 1 via ORAL
  Administered 2016-10-23 – 2016-10-26 (×16): 2 via ORAL
  Filled 2016-10-20: qty 2
  Filled 2016-10-20: qty 1
  Filled 2016-10-20 (×17): qty 2
  Filled 2016-10-20: qty 1
  Filled 2016-10-20 (×9): qty 2

## 2016-10-20 MED ORDER — ACETAMINOPHEN 325 MG PO TABS: 650.0000 mg | ORAL_TABLET | ORAL | Status: DC | PRN

## 2016-10-20 MED ORDER — HYDRALAZINE HCL 20 MG/ML IJ SOLN: 10.0000 mg | Freq: Four times a day (QID) | INTRAMUSCULAR | Status: DC | PRN

## 2016-10-20 MED ORDER — FENTANYL CITRATE (PF) 100 MCG/2ML IJ SOLN
INTRAMUSCULAR | Status: AC
  Filled 2016-10-20: qty 2

## 2016-10-20 MED ORDER — FENTANYL CITRATE (PF) 100 MCG/2ML IJ SOLN
INTRAMUSCULAR | Status: DC | PRN
  Administered 2016-10-20 (×2): 50 ug via INTRAVENOUS

## 2016-10-20 MED ORDER — MORPHINE SULFATE (PF) 4 MG/ML IV SOLN
INTRAVENOUS | Status: AC
  Filled 2016-10-20: qty 1

## 2016-10-20 MED ORDER — OXYCODONE-ACETAMINOPHEN 5-325 MG PO TABS
ORAL_TABLET | ORAL | Status: AC
  Filled 2016-10-20: qty 2

## 2016-10-20 MED ORDER — MORPHINE SULFATE (PF) 4 MG/ML IV SOLN
2.0000 mg | INTRAVENOUS | Status: DC | PRN
  Administered 2016-10-20 – 2016-10-23 (×12): 2 mg via INTRAVENOUS
  Filled 2016-10-20 (×11): qty 1

## 2016-10-20 MED ORDER — SODIUM CHLORIDE 0.9 % IV SOLN
INTRAVENOUS | Status: DC
  Administered 2016-10-20: 100 mL/h via INTRAVENOUS
  Administered 2016-10-21 – 2016-10-26 (×11): via INTRAVENOUS

## 2016-10-20 MED ORDER — LIDOCAINE HCL (PF) 1 % IJ SOLN
INTRAMUSCULAR | Status: AC
  Filled 2016-10-20: qty 30

## 2016-10-20 MED ORDER — HEPARIN (PORCINE) IN NACL 2-0.9 UNIT/ML-% IJ SOLN
INTRAMUSCULAR | Status: DC | PRN
  Administered 2016-10-20: 1000 mL

## 2016-10-20 SURGICAL SUPPLY — 8 items
CATH OMNI FLUSH 5F 65CM (CATHETERS) ×2 IMPLANT
KIT PV (KITS) ×2 IMPLANT
SHEATH PINNACLE 5F 10CM (SHEATH) ×2 IMPLANT
SYR MEDRAD MARK V 150ML (SYRINGE) ×2 IMPLANT
TRANSDUCER W/STOPCOCK (MISCELLANEOUS) ×2 IMPLANT
TRAY PV CATH (CUSTOM PROCEDURE TRAY) ×2 IMPLANT
WIRE BENTSON .035X145CM (WIRE) ×2 IMPLANT
WIRE MINI STICK MAX (SHEATH) ×2 IMPLANT

## 2016-10-20 NOTE — Progress Notes (Signed)
Ankle Brachial Index   Upper Extremity Right Left  Brachial Pressures 144, Tri 161, Tri    Lower  Extremity Right Left  Dorsalis Pedis No flow detected 154, Tri  Posterior Tibial No flow detected 157, Tri  Ankle/Brachial Indices NA 0.98    Findings:  Right ABI indeterminate, Left ABI within normal limits Levin Baconlaire Sebert Stollings- RDMS, RVT 12:52 PM  10/20/2016

## 2016-10-20 NOTE — Progress Notes (Signed)
*  PRELIMINARY RESULTS* Vascular Ultrasound Right lower extemity venous duplex has been completed.  Preliminary findings: No evidence of deep vein thrombosis or baker's cyst in the right lower extremity.   Ronald FischerCharlotte C Kahlie Deutscher 10/20/2016, 12:46 PM

## 2016-10-20 NOTE — ED Notes (Signed)
Pt returned from vascular on stretcher, reports 10/10 pain to R big toe and generalized headache.

## 2016-10-20 NOTE — ED Notes (Signed)
Pt transported to vascular.  °

## 2016-10-20 NOTE — ED Notes (Signed)
Pt transported for angiogram.

## 2016-10-20 NOTE — Op Note (Signed)
    Patient name: Ronald Forbes MRN: [id number] DOB: 03-28-1961 Sex: male  10/20/2016 Pre-operative Diagnosis: right leg rest pain Post-operative diagnosis:  Same Surgeon:  Apolinar JunesBrandon C. Randie Heinzain, MD Procedure Performed: 1.  US guided access of left common femoral artery 2.  Aortogram with right lower extremity angiogram 3.  Moderate sedation for 15 minutes with fentanyl and versed  Indications:  56 year old male history of a stroke with residual right-sided weakness. He now presents with several days of severe rest pain and numbness to his foot. He is indicated for the above procedure for  Findings: Aortoiliac segments are patent. There is a flush occlusion of the right superficial femoral artery which is reconstituted distally via tibioperoneal trunk. There is runoff to the level of the ankle via the peroneal and PT artery on the right with dominant to the foot being the PT artery. Anterior tibial artery is occluded at its origin.   Procedure:  The patient was identified in the holding area and taken to room 8.  The patient was then placed supine on the table and prepped and draped in the usual sterile fashion.  A time out was called.  Ultrasound was used to evaluate the left common femoral artery.  It was patent .  A digital ultrasound image was acquired.  A micropuncture needle was used to access the left common femoral artery under ultrasound guidance.  An 018 wire was advanced without resistance and a micropuncture sheath was placed.  The 018 wire was removed and a benson wire was placed.  The micropuncture sheath was exchanged for a 5 french sheath.  An omniflush catheter was advanced over the wire to the level of L-1.  An abdominal angiogram was obtained.  Next, using the omniflush catheter and a benson wire, the aortic bifurcation was crossed and the catheter was placed into the right external iliac artery and right runoff was obtained. With the findings above we elected to terminate procedure,  catheter and wire were removed and he tolerated well.   Contrast: 55cc    Novaleigh Kohlman C. Randie Heinzain, MD [facility] [place] Office: [phone] Pager: [phone]

## 2016-10-20 NOTE — Consult Note (Signed)
[facility] [place] CONSULT NOTE   MRN : [id number]  Reason for Consult: Right ischemic foot Referring Physician: ED  History of Present Illness: 56 y/o male with acute onset of right lower leg pain, numbness, and loss of motor since Monday that has gotten progressively worse.  He was able to ambulate with a cane prior to this.  He has a history of CVA with right UE/LE weakness 01/2016.  He was discharged to a SNF on a statin and Asprin.  An echo was performed during the admission which demonstrated no cardiac sorce.  He was discharged from the SNF in OCT. 2017.  He has not taken any medications since then.  Other medical history includes DM untreated since Oct.  He is a current smoker and has smoked for 20+ years and alcohol abuse.  He denise CAD and A fib history.     No current facility-administered medications for this encounter.    Current Outpatient Prescriptions  Medication Sig Dispense Refill  . aspirin 81 MG chewable tablet Chew 1 tablet (81 mg total) by mouth daily. 30 tablet 0  . atorvastatin (LIPITOR) 40 MG tablet Take 1 tablet (40 mg total) by mouth daily at 6 PM. 30 tablet 0  . oxyCODONE-acetaminophen (PERCOCET/ROXICET) 5-325 MG tablet Take 1-2 tablets by mouth every 4 (four) hours as needed for severe pain. 15 tablet 0    Pt meds include: Statin :No Betablocker: No ASA: No Other anticoagulants/antiplatelets: none  Past Medical History:  Diagnosis Date  . Alcohol use disorder (HCC)   . Cerebral vascular accident (HCC)   . 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 Diabetes mellitus type 2 in obese (HCC)   . HLD (hyperlipidemia)   . Morbid obesity (HCC)   . Stroke (HCC)   . Tobacco abuse     Past Surgical History:  Procedure Laterality Date  . EP IMPLANTABLE DEVICE N/A 02/09/2016   Procedure: Loop Recorder Insertion;  Surgeon: Duke SalviaSteven C Klein, MD;  [facility];  Service:  Cardiovascular;  Laterality: N/A;  . TEE WITHOUT CARDIOVERSION N/A 02/06/2016   Procedure: TRANSESOPHAGEAL ECHOCARDIOGRAM (TEE);  Surgeon: Wendall StadePeter C Nishan, MD;  [facility];  Service: Cardiovascular;  Laterality: N/A;    Social History Social History  Substance Use Topics  . Smoking status: Current Every Day Smoker    Packs/day: 1.00    Years: 40.00    Types: Cigarettes  . Smokeless tobacco: Never Used  . Alcohol use 0.0 oz/week     Comment: 6 pack/day    Family History No family history on file. Unknown  No Known Allergies   REVIEW OF SYSTEMS  General: [ ]  Weight loss, [ ]  Fever, [ ]  chills Neurologic: [ ]  Dizziness, [ ]  Blackouts, [ ]  Seizure [ ]  Stroke, [ ]  "Mini stroke", [ ]  Slurred speech, [ ]  Temporary blindness; [ x] weakness in arms or legs, [ ]  Hoarseness [ ]  Dysphagia Cardiac: [ ]  Chest pain/pressure, [ ]  Shortness of breath at rest [ ]  Shortness of breath with exertion, [ ]  Atrial fibrillation or irregular heartbeat  Vascular: [ ]  Pain in legs with walking, [x ] Pain in legs at rest, [ ]  Pain in legs at night,  [ ]  Non-healing ulcer, [ ]  Blood clot in vein/DVT,   Pulmonary: [ ]  Home oxygen, [ ]  Productive cough, [ ]  Coughing up blood, [ ]  Asthma,  [ ]  Wheezing [ ]  COPD  Musculoskeletal:  [ ]  Arthritis, [ ]  Low back pain, [ ]  Joint pain Hematologic: [ ]  Easy Bruising, [ ]  Anemia; [ ]  Hepatitis Gastrointestinal: [ ]  Blood in stool, [ ]  Gastroesophageal Reflux/heartburn, Urinary: [ ]  chronic Kidney disease, [ ]  on HD - [ ]  MWF or [ ]  TTHS, [ ]  Burning with urination, [ ]  Difficulty urinating Skin: [ ]  Rashes, [ ]  Wounds Psychological: [ ]  Anxiety, [ ]  Depression  Physical Examination Vitals:   10/20/16 0926 10/20/16 1000 10/20/16 1216  BP:  134/87 (!) 159/108  Pulse:  66 64  Resp:  17 20  SpO2: 97% 96% 96%   There is no height or weight on file to calculate BMI.  General:  WDWN in NAD Gait: not observed HENT: WNL Eyes: Pupils equal Pulmonary:  normal non-labored breathing , without Rales, rhonchi,  wheezing Cardiac: RRR, without  Murmurs, rubs or gallops; No carotid bruits Abdomen: soft, NT, palpable aortic pulse Skin: no rashes, ulcers noted;  no Gangrene , no cellulitis; no open wounds;   Vascular Exam/Pulses:palpable B radial, femoral pulses, left popliteal, DP, PT. Doppler signal right popliteal and peroneal.  No DP/PT signals.   Musculoskeletal: no muscle wasting or atrophy; no edema  Neurologic: A&O X 3; Appropriate Affect ;  SENSATION: normal; MOTOR FUNCTION: 5/5 Left UE and LE, right UE 3/5, minimal right great toe and ankle motor  Speech is intact   Significant Diagnostic Studies: CBC Lab Results  Component Value Date   WBC 7.8 10/20/2016   HGB 14.7 10/20/2016   HCT 42.4 10/20/2016   MCV 90.4 10/20/2016   PLT 160 10/20/2016    BMET    Component Value Date/Time   NA 139 10/20/2016 1048   NA 136 (A) 02/04/2016   K 4.1 10/20/2016 1048   CL 104 10/20/2016 1048   CO2 26 10/20/2016 1048   GLUCOSE 105 (H) 10/20/2016 1048   BUN 13 10/20/2016 1048   BUN 15 02/04/2016   CREATININE 0.82 10/20/2016 1048   CALCIUM 9.4 10/20/2016 1048   GFRNONAA >60 10/20/2016 1048   GFRAA >60 10/20/2016 1048   CrCl cannot be calculated (Unknown ideal weight.).  COAG Lab Results  Component Value Date   INR 1.01 02/04/2016     Non-Invasive Vascular Imaging:  Ankle Brachial Index   Upper Extremity Right Left  Brachial Pressures 144, Tri 161, Tri    Lower  Extremity Right Left  Dorsalis Pedis No flow detected 154, Tri  Posterior Tibial No flow detected 157, Tri  Ankle/Brachial Indices NA 0.98    *PRELIMINARY RESULTS* Vascular Ultrasound Lower Extremity Arterial Duplex has been completed.  Preliminary findings: Triphasic flow seen in the right iliac, monophasic flow in the common femoral artery and profunda. No flow seen in the femoral artery, popliteal or calf arteries.  Anterior tibial artery and dorsalis  pedis not well interrogated due to patients pain tolerance and refusal to finish exam.  ASSESSMENT/PLAN:  Right LE ischemia likely at the level of the common femoral artery. Plan angiogram with run off and possible intervention by Dr.Abeeha Twist.  There is the possibility of further surgery pending angiogram findings.   Thomasena Edis EMMA MAUREEN 10/20/2016 1:32 PM   I have interviewed patient and agree with PA assessment and plan above. Long-term smoker and diabetic with a few day history of right lower extremity pain and numbness to his foot. This appears to be a subacute issue with a chronic component. His left foot is completely viable. We'll proceed with aortogram runoff  on the right and possible intervention. We discussed the risk and benefits he is willing to proceed and consent signed.  Anaih Brander C. Randie Heinz, MD [facility] [place] Office: [phone] Pager: [phone]

## 2016-10-20 NOTE — ED Triage Notes (Addendum)
Pt presents via [facility] for evaluation of R great toe pain x 3 days. Reports numbness to R great toe. Pt AxO x4. Hx of CVA in 8/17 with R arm deficit. Pt reports uncertain of whether he hit toe or not.

## 2016-10-20 NOTE — ED Provider Notes (Signed)
[facility] Provider Note   CSN: [id number] Arrival date & time: 10/20/16  1191     History   Chief Complaint Chief Complaint  Patient presents with  . Toe Pain    HPI Ronald Forbes is a 56 y.o. male who presents with right foot pain. PMH significant for hx of CVA with right sided deficits, alcohol abuse, HLD, tobacco abuse, medication non-adherence. He is a poor historian. He states that his right leg started hurting several days ago. That got better but then his right foot continued to hurt. He has pain from the mid foot to the toes. He reports associated numbness and tingling in the toes. He has not taken any thing for pain. Has hx of gout but denies redness, swelling, or injury.  HPI  Past Medical History:  Diagnosis Date  . Alcohol use disorder (HCC)   . Cerebral vascular accident (HCC)   . 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 Diabetes mellitus type 2 in obese (HCC)   . HLD (hyperlipidemia)   . Morbid obesity (HCC)   . Stroke (HCC)   . Tobacco abuse     Patient Active Problem List   Diagnosis Date Noted  . Thyroid nodule 02/29/2016  . 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 Cerebrovascular accident (CVA) due to embolism of left posterior cerebral artery (HCC)   . Diabetes mellitus type 2 in obese (HCC) 02/07/2016  . HLD (hyperlipidemia)   . Tobacco abuse 02/04/2016  . Alcohol use disorder (HCC) 02/04/2016  . Morbid obesity (HCC) 02/04/2016  . CVA (cerebral vascular accident) (HCC) 02/04/2016    Past Surgical History:  Procedure Laterality Date  . EP IMPLANTABLE DEVICE N/A 02/09/2016   Procedure: Loop Recorder Insertion;  Surgeon: Duke Salvia, MD;  Location[facility] INVASIVE CV LAB;  Service: Cardiovascular;  Laterality: N/A;  . TEE WITHOUT CARDIOVERSION N/A 02/06/2016   Procedure: TRANSESOPHAGEAL ECHOCARDIOGRAM (TEE);  Surgeon: Wendall Stade, MD;  [facility]COPY;  Service: Cardiovascular;   Laterality: N/A;       Home Medications    Prior to Admission medications   Medication Sig Start Date End Date Taking? Authorizing Provider  aspirin 81 MG chewable tablet Chew 1 tablet (81 mg total) by mouth daily. 05/22/16   Emi Holes, PA-C  atorvastatin (LIPITOR) 40 MG tablet Take 1 tablet (40 mg total) by mouth daily at 6 PM. 05/22/16   Emi Holes, PA-C  oxyCODONE-acetaminophen (PERCOCET/ROXICET) 5-325 MG tablet Take 1-2 tablets by mouth every 4 (four) hours as needed for severe pain. 05/22/16   Emi Holes, PA-C    Family History No family history on file.  Social History Social History  Substance Use Topics  . Smoking status: Current Every Day Smoker    Packs/day: 1.00    Years: 40.00    Types: Cigarettes  . Smokeless tobacco: Never Used  . Alcohol use 0.0 oz/week     Comment: 6 pack/day     Allergies   Patient has no known allergies.   Review of Systems Review of Systems  Musculoskeletal: Positive for arthralgias. Negative for joint swelling.  Skin: Positive for color change.  Neurological: Positive for numbness.  All other systems reviewed and are negative.    Physical Exam Updated Vital Signs BP (!) 159/108 (BP Location: Right Arm)   Pulse 64   Resp 20   SpO2 96%   Physical Exam  Constitutional: He is oriented to person, place, and  time. He appears well-developed and well-nourished. No distress.  HENT:  Head: Normocephalic and atraumatic.  Eyes: Conjunctivae are normal. Pupils are equal, round, and reactive to light. Right eye exhibits no discharge. Left eye exhibits no discharge. No scleral icterus.  Neck: Normal range of motion.  Cardiovascular: Normal rate.   Pulmonary/Chest: Effort normal. No respiratory distress.  Abdominal: He exhibits no distension.  Musculoskeletal:  Right leg: No redness or swelling. Foot is cool to touch when compared to left. There is pallor of the foot. No palpable DP or PT pulse. Femoral pulse is palpable.  Unable to obtain dopplerable pulse.  Neurological: He is alert and oriented to person, place, and time.  Skin: Skin is warm and dry.  Psychiatric: He has a normal mood and affect. His behavior is normal.  Nursing note and vitals reviewed.    ED Treatments / Results  Labs (all labs ordered are listed, but only abnormal results are displayed) Labs Reviewed  BASIC METABOLIC PANEL - Abnormal; Notable for the following:       Result Value   Glucose, Bld 105 (*)    All other components within normal limits  CBC WITH DIFFERENTIAL/PLATELET  CBC WITH DIFFERENTIAL/PLATELET    EKG  EKG Interpretation None       Radiology No results found.  Procedures Procedures (including critical care time)  Medications Ordered in ED Medications  midazolam (VERSED) injection (1 mg Intravenous Given 10/20/16 1431)  fentaNYL (SUBLIMAZE) injection (50 mcg Intravenous Given 10/20/16 1454)  lidocaine (PF) (XYLOCAINE) 1 % injection (15 mLs Subcutaneous Given 10/20/16 1431)  heparin infusion 2 units/mL in 0.9 % sodium chloride (1,000 mLs Other New Bag/Given 10/20/16 1432)  iodixanol (VISIPAQUE) 320 MG/ML injection (55 mLs Intra-arterial Given 10/20/16 1454)  morphine 4 MG/ML injection 4 mg (4 mg Intravenous Given 10/20/16 1049)  morphine 4 MG/ML injection 4 mg (4 mg Intravenous Given 10/20/16 1233)  ketorolac (TORADOL) 30 MG/ML injection 15 mg (15 mg Intravenous Given 10/20/16 1233)     Initial Impression / Assessment and Plan / ED Course  I have reviewed the triage vital signs and the nursing notes.  Pertinent labs & imaging results that were available during my care of the patient were reviewed by me and considered in my medical decision making (see chart for details).  56 year old male presents with symptoms worrisome for ischemic limb due to PAD. He is hypertensive otherwise vitals are normal. Labs are overall unremarkable. LE arterial duplex is remarkable for no flow from femoral artery down. DVT study is  negative. Right ABI is not detected. Left ABI is normal. Spoke with Dr. Darrick PennaFields who will come to evaluate patient.   Update: After eval by PA, pt will be taken for angiogram.   Final Clinical Impressions(s) / ED Diagnoses   Final diagnoses:  Right foot pain  PAD (peripheral artery disease) [facility](HCC)    New Prescriptions New Prescriptions   No medications on file     Bethel BornKelly Marie Gekas, PA-C 10/20/16 1507    Lavera Guiseana Duo Liu, MD 10/20/16 [phone]

## 2016-10-20 NOTE — Progress Notes (Addendum)
*  PRELIMINARY RESULTS* Vascular Ultrasound Lower Extremity Arterial Duplex has been completed.  Preliminary findings: Triphasic flow seen in the right iliac, monophasic flow in the common femoral artery and profunda. No flow seen in the femoral artery, popliteal or calf arteries.  Anterior tibial artery and dorsalis pedis not well interrogated due to patients pain tolerance and refusal to finish exam.  Preliminary results called to Norberta KeensRn, Julie @ 12:05p Called Terance HartKelly Gekas, PA, @ 12:40 to confirm preliminary report.  Chauncey FischerCharlotte C Collis Thede 10/20/2016, 12:17 PM

## 2016-10-21 ENCOUNTER — Inpatient Hospital Stay (HOSPITAL_COMMUNITY): Payer: Medicaid Other

## 2016-10-21 ENCOUNTER — Encounter (HOSPITAL_COMMUNITY): Payer: Self-pay | Admitting: Vascular Surgery

## 2016-10-21 DIAGNOSIS — I739 Peripheral vascular disease, unspecified: Secondary | ICD-10-CM

## 2016-10-21 LAB — SURGICAL PCR SCREEN
MRSA, PCR: NEGATIVE
Staphylococcus aureus: NEGATIVE

## 2016-10-21 MED ORDER — DEXTROSE 5 % IV SOLN
1.5000 g | INTRAVENOUS | Status: AC
  Filled 2016-10-21: qty 1.5

## 2016-10-21 NOTE — Progress Notes (Signed)
  Progress Note    10/21/2016 9:41 AM 1 Day Post-Op  Subjective:  Having right foot rest pain  Vitals:   10/20/16 1956 10/21/16 0510  BP: (!) 146/92 (!) 148/87  Pulse: 66 64  Resp: 18 18  Temp: 98.6 F (37 C) 98.4 F (36.9 C)    Physical Exam: aaox3 Non labored respirations Abdomen is soft Right foot is warm, monophasic pt  CBC    Component Value Date/Time   WBC 8.1 10/20/2016 1848   RBC 4.43 10/20/2016 1848   HGB 13.7 10/20/2016 1848   HCT 40.0 10/20/2016 1848   PLT 155 10/20/2016 1848   MCV 90.3 10/20/2016 1848   MCH 30.9 10/20/2016 1848   MCHC 34.3 10/20/2016 1848   RDW 13.3 10/20/2016 1848   LYMPHSABS 2.7 10/20/2016 1234   MONOABS 0.5 10/20/2016 1234   EOSABS 0.1 10/20/2016 1234   BASOSABS 0.0 10/20/2016 1234    BMET    Component Value Date/Time   NA 139 10/20/2016 1048   NA 136 (A) 02/04/2016   K 4.1 10/20/2016 1048   CL 104 10/20/2016 1048   CO2 26 10/20/2016 1048   GLUCOSE 105 (H) 10/20/2016 1048   BUN 13 10/20/2016 1048   BUN 15 02/04/2016   CREATININE 0.83 10/20/2016 1848   CALCIUM 9.4 10/20/2016 1048   GFRNONAA >60 10/20/2016 1848   GFRAA >60 10/20/2016 1848    INR    Component Value Date/Time   INR 1.01 02/04/2016 0849     Intake/Output Summary (Last 24 hours) at 10/21/16 0941 Last data filed at 10/20/16 2100  Gross per 24 hour  Intake              480 ml  Output                0 ml  Net              480 ml     Assessment:  56 y.o. male is here with right foot rest pain, angio demonstrated flush occlusion of the right sfa, reconstitutes his below knee popliteal artery/tp trunk  Plan: Vein mapping today Bypass tomorrow scheduled with Dr. Rayann HemanBrabham   Brandon C. Randie Heinzain, MD [facility] [place] Office: [phone] Pager: [phone]  10/21/2016 9:41 AM

## 2016-10-22 ENCOUNTER — Encounter (HOSPITAL_COMMUNITY): Payer: Self-pay | Admitting: Certified Registered Nurse Anesthetist

## 2016-10-22 ENCOUNTER — Encounter (HOSPITAL_COMMUNITY)
Admission: EM | Disposition: A | Discharge status: Rehabilitation Facility | Payer: Self-pay | Source: Home / Self Care | Attending: Vascular Surgery

## 2016-10-22 LAB — BASIC METABOLIC PANEL
ANION GAP: 8 (ref 5–15)
BUN: 9 mg/dL (ref 6–20)
CALCIUM: 8.5 mg/dL — AB (ref 8.9–10.3)
CO2: 21 mmol/L — ABNORMAL LOW (ref 22–32)
Chloride: 109 mmol/L (ref 101–111)
Creatinine, Ser: 0.8 mg/dL (ref 0.61–1.24)
GLUCOSE: 105 mg/dL — AB (ref 65–99)
POTASSIUM: 3.9 mmol/L (ref 3.5–5.1)
Sodium: 138 mmol/L (ref 135–145)

## 2016-10-22 LAB — CBC
HEMATOCRIT: 39.2 % (ref 39.0–52.0)
Hemoglobin: 13.3 g/dL (ref 13.0–17.0)
MCH: 31 pg (ref 26.0–34.0)
MCHC: 33.9 g/dL (ref 30.0–36.0)
MCV: 91.4 fL (ref 78.0–100.0)
PLATELETS: 154 10*3/uL (ref 150–400)
RBC: 4.29 MIL/uL (ref 4.22–5.81)
RDW: 13.3 % (ref 11.5–15.5)
WBC: 6.7 10*3/uL (ref 4.0–10.5)

## 2016-10-22 SURGERY — BYPASS GRAFT FEMORAL-POPLITEAL ARTERY
Anesthesia: General | Laterality: Right

## 2016-10-22 MED ORDER — LIDOCAINE 2% (20 MG/ML) 5 ML SYRINGE
INTRAMUSCULAR | Status: AC
  Filled 2016-10-22: qty 5

## 2016-10-22 MED ORDER — MIDAZOLAM HCL 2 MG/2ML IJ SOLN
INTRAMUSCULAR | Status: AC
  Filled 2016-10-22: qty 2

## 2016-10-22 MED ORDER — FENTANYL CITRATE (PF) 100 MCG/2ML IJ SOLN
INTRAMUSCULAR | Status: AC
  Filled 2016-10-22: qty 2

## 2016-10-22 MED ORDER — PROPOFOL 10 MG/ML IV BOLUS
INTRAVENOUS | Status: AC
  Filled 2016-10-22: qty 20

## 2016-10-22 NOTE — Progress Notes (Addendum)
  Progress Note    10/22/2016 10:51 AM 2 Days Post-Op  Subjective:  Pain in right foot  Vitals:   10/21/16 2001 10/22/16 0516  BP: (!) 145/88 (!) 158/89  Pulse: 65 (!) 57  Resp: 18 18  Temp: 98.7 F (37.1 C) 98.2 F (36.8 C)    Physical Exam: aaox3 Monophasic left pt  CBC    Component Value Date/Time   WBC 6.7 10/22/2016 0218   RBC 4.29 10/22/2016 0218   HGB 13.3 10/22/2016 0218   HCT 39.2 10/22/2016 0218   PLT 154 10/22/2016 0218   MCV 91.4 10/22/2016 0218   MCH 31.0 10/22/2016 0218   MCHC 33.9 10/22/2016 0218   RDW 13.3 10/22/2016 0218   LYMPHSABS 2.7 10/20/2016 1234   MONOABS 0.5 10/20/2016 1234   EOSABS 0.1 10/20/2016 1234   BASOSABS 0.0 10/20/2016 1234    BMET    Component Value Date/Time   NA 138 10/22/2016 0218   NA 136 (A) 02/04/2016   K 3.9 10/22/2016 0218   CL 109 10/22/2016 0218   CO2 21 (L) 10/22/2016 0218   GLUCOSE 105 (H) 10/22/2016 0218   BUN 9 10/22/2016 0218   BUN 15 02/04/2016   CREATININE 0.80 10/22/2016 0218   CALCIUM 8.5 (L) 10/22/2016 0218   GFRNONAA >60 10/22/2016 0218   GFRAA >60 10/22/2016 0218    INR    Component Value Date/Time   INR 1.01 02/04/2016 0849     Intake/Output Summary (Last 24 hours) at 10/22/16 1051 Last data filed at 10/22/16 0001  Gross per 24 hour  Intake              800 ml  Output                0 ml  Net              800 ml     Assessment:  56 y.o. male is here with rest pain rle with severely diminshed abi and occluded sfa/pop on the right.   Plan: OR today with Dr. Myra GianottiBrabham for RLE bypass with vein. He demonstrates understanding but is mostly focused on his pain.    Rakesh Dutko C. Randie Heinzain, MD [facility] [place] Office: [phone] Pager: [phone]  10/22/2016 10:51 AM    Procedure postponed until Monday with Dr. Darrick PennaFields.   Lemar LivingsBrandon Donevin Sainsbury

## 2016-10-22 NOTE — Progress Notes (Signed)
Patient cont. To complain of rt. Ft. pain 9/10 even with Morphine and Perocet given tonight no changes in pulse color or temp of rt ext.

## 2016-10-22 NOTE — Care Management Note (Addendum)
Case Management Note  Patient Details  Name: Ronald Forbes MRN: [id number] Date of Birth: 06/29/1961  Subjective/Objective:                  56 y.o. male is here with rest pain rle with severely diminshed abi and occluded sfa/pop on the right. For OR today. Covered through medicaid.  PCP    [facility]     [facility]  Address: [address] [place] [id number]  Contact: [facility]  Telephone: [phone]  Telephone: [phone]  Contact: [employer] [place]  Work Phone: [phone]        Action/Plan:   Expected Discharge Date:                  Expected Discharge Plan:  Home/Self Care  In-House Referral:     Discharge planning Services  CM Consult  Post Acute Care Choice:    Choice offered to:     DME Arranged:    DME Agency:     HH Arranged:    HH Agency:     Status of Service:  In process, will continue to follow  If discussed at Long Length of Stay Meetings, dates discussed:    Additional Comments:  Lawerance SabalDebbie Marguerita Stapp, RN 10/22/2016, 3:53 PM

## 2016-10-23 MED ORDER — DIPHENHYDRAMINE HCL 12.5 MG/5ML PO ELIX: 12.5000 mg | ORAL_SOLUTION | Freq: Four times a day (QID) | ORAL | Status: DC | PRN

## 2016-10-23 MED ORDER — ONDANSETRON HCL 4 MG/2ML IJ SOLN: 4.0000 mg | Freq: Four times a day (QID) | INTRAMUSCULAR | Status: DC | PRN

## 2016-10-23 MED ORDER — HYDROMORPHONE 1 MG/ML IV SOLN: INTRAVENOUS | Status: DC

## 2016-10-23 MED ORDER — DIPHENHYDRAMINE HCL 50 MG/ML IJ SOLN: 12.5000 mg | Freq: Four times a day (QID) | INTRAMUSCULAR | Status: DC | PRN

## 2016-10-23 MED ORDER — NALOXONE HCL 0.4 MG/ML IJ SOLN: 0.4000 mg | INTRAMUSCULAR | Status: DC | PRN

## 2016-10-23 MED ORDER — SODIUM CHLORIDE 0.9% FLUSH: 9.0000 mL | INTRAVENOUS | Status: DC | PRN

## 2016-10-23 MED ORDER — MORPHINE SULFATE 2 MG/ML IV SOLN
INTRAVENOUS | Status: DC
  Administered 2016-10-23: 1 mg via INTRAVENOUS
  Administered 2016-10-23: 26 mg via INTRAVENOUS
  Administered 2016-10-23: 13.5 mg via INTRAVENOUS
  Administered 2016-10-24: 6 mg via INTRAVENOUS
  Administered 2016-10-24: 0 mg via INTRAVENOUS
  Administered 2016-10-24: 3 mg via INTRAVENOUS
  Filled 2016-10-23 (×2): qty 30

## 2016-10-23 NOTE — Progress Notes (Signed)
Pt had orders for Morphine and Dilaudid for PCA pump. Contacted MD. Verbal order given to continue with Morphine and D/C Dilaudid. Will continue to monitor. Gregor HamsAlisha Elsy Chiang, RN

## 2016-10-23 NOTE — Progress Notes (Addendum)
  Progress Note 10/23/2016 7:46 AM 3 Days Post-Op  Subjective:  Says his foot is still numb & hurts-wants pain medicine  Afebrile HR  60's-70's NSR 130's-150's systolic 97% RA  Vitals:   10/22/16 2003 10/23/16 0417  BP: 132/82 (!) 152/84  Pulse: 71 64  Resp: 18 16  Temp: 98.9 F (37.2 C) 97.8 F (36.6 C)    Physical Exam: Cardiac:  regular Lungs:  Non labored Incisions:  Left groin is soft without hematoma Extremities:  Monophasic right PT doppler signal   CBC    Component Value Date/Time   WBC 6.7 10/22/2016 0218   RBC 4.29 10/22/2016 0218   HGB 13.3 10/22/2016 0218   HCT 39.2 10/22/2016 0218   PLT 154 10/22/2016 0218   MCV 91.4 10/22/2016 0218   MCH 31.0 10/22/2016 0218   MCHC 33.9 10/22/2016 0218   RDW 13.3 10/22/2016 0218   LYMPHSABS 2.7 10/20/2016 1234   MONOABS 0.5 10/20/2016 1234   EOSABS 0.1 10/20/2016 1234   BASOSABS 0.0 10/20/2016 1234    BMET    Component Value Date/Time   NA 138 10/22/2016 0218   NA 136 (A) 02/04/2016   K 3.9 10/22/2016 0218   CL 109 10/22/2016 0218   CO2 21 (L) 10/22/2016 0218   GLUCOSE 105 (H) 10/22/2016 0218   BUN 9 10/22/2016 0218   BUN 15 02/04/2016   CREATININE 0.80 10/22/2016 0218   CALCIUM 8.5 (L) 10/22/2016 0218   GFRNONAA >60 10/22/2016 0218   GFRAA >60 10/22/2016 0218    INR    Component Value Date/Time   INR 1.01 02/04/2016 0849     Intake/Output Summary (Last 24 hours) at 10/23/16 0746 Last data filed at 10/23/16 0300  Gross per 24 hour  Intake              240 ml  Output                0 ml  Net              240 ml     Assessment:  56 y.o. male is s/p:  Procedure Performed: 1.  US guided access of left common femoral artery 2.  Aortogram with right lower extremity angiogram 3.  Moderate sedation for 15 minutes with fentanyl and versed  3 Days Post-Op  Plan: -plan is for right femoropopliteal bypass grafting on Monday by Dr. Darrick PennaFields -he has a monophasic PT doppler signal  -continue pain  control -DVT prophylaxis:  SQ heparin   Doreatha MassedSamantha Rhyne, PA-C [facility] [phone] 10/23/2016 7:46 AM  I have interviewed the patient and examined the patient. I agree with the findings by the PA. Still having significant right foot pain. Will order PCA For Right Fem Pop bypass Monday by Dr. Darrick PennaFields.  Cari Carawayhris Telisa Ohlsen, MD [phone]

## 2016-10-24 MED ORDER — MORPHINE SULFATE (PF) 4 MG/ML IV SOLN
4.0000 mg | INTRAVENOUS | Status: DC | PRN
  Administered 2016-10-24 – 2016-10-26 (×8): 4 mg via INTRAVENOUS
  Filled 2016-10-24 (×9): qty 1

## 2016-10-24 MED ORDER — CEFAZOLIN IN D5W 1 GM/50ML IV SOLN: 1.0000 g | INTRAVENOUS | Status: DC

## 2016-10-24 MED ORDER — CEFAZOLIN SODIUM-DEXTROSE 2-4 GM/100ML-% IV SOLN
2.0000 g | INTRAVENOUS | Status: AC
  Administered 2016-10-25 (×2): 2 g via INTRAVENOUS
  Filled 2016-10-24 (×2): qty 100

## 2016-10-24 NOTE — Progress Notes (Addendum)
  Progress Note    10/24/2016 8:46 AM 4 Days Post-Op  Subjective:  Pt disconnected from PCA; frustrated with the beeping and oxygen.  Continues to have pain.  Says he has not had any pain meds bc they haven't brought it to him but he has not asked for it because he forgot to ask.   Tm 99 now afebrile HR 60's-70's NSR 140's-150's systolic 97% RA  Vitals:   10/23/16 2341 10/24/16 0510  BP:  (!) 146/84  Pulse:  61  Resp: 16 13  Temp:  98.7 F (37.1 C)    Physical Exam: Cardiac:  regular Lungs:  Non labored Extremities:  Monophasic PT doppler signal   CBC    Component Value Date/Time   WBC 6.7 10/22/2016 0218   RBC 4.29 10/22/2016 0218   HGB 13.3 10/22/2016 0218   HCT 39.2 10/22/2016 0218   PLT 154 10/22/2016 0218   MCV 91.4 10/22/2016 0218   MCH 31.0 10/22/2016 0218   MCHC 33.9 10/22/2016 0218   RDW 13.3 10/22/2016 0218   LYMPHSABS 2.7 10/20/2016 1234   MONOABS 0.5 10/20/2016 1234   EOSABS 0.1 10/20/2016 1234   BASOSABS 0.0 10/20/2016 1234    BMET    Component Value Date/Time   NA 138 10/22/2016 0218   NA 136 (A) 02/04/2016   K 3.9 10/22/2016 0218   CL 109 10/22/2016 0218   CO2 21 (L) 10/22/2016 0218   GLUCOSE 105 (H) 10/22/2016 0218   BUN 9 10/22/2016 0218   BUN 15 02/04/2016   CREATININE 0.80 10/22/2016 0218   CALCIUM 8.5 (L) 10/22/2016 0218   GFRNONAA >60 10/22/2016 0218   GFRAA >60 10/22/2016 0218    INR    Component Value Date/Time   INR 1.01 02/04/2016 0849    No intake or output data in the 24 hours ending 10/24/16 0846   Assessment:  55 y.o. male is s/p:  Procedure Performed: 1. US guided access of left common femoral artery 2. Aortogram with right lower extremity angiogram 3. Moderate sedation for 15 minutes with fentanyl and versed   4 Days Post-Op  Plan: -plan for right femoropopliteal bypass grafting tomorrow by Dr. Fields; pt pre-op'd and reminded pt nothing to eat or drink after MN -monophasic doppler signal present -PCA  is off and disconnected-he was frustrated with the beeping and O2 sensor.  He says he has not gotten any pain medicine and that they were supposed to bring it to him this morning.  He has not asked for any.  I reminded him again that this is not scheduled and he has to ask for it.  -DVT prophylaxis:  SQ heparin   Samantha Rhyne, PA-C [facility] [phone] 10/24/2016 8:46 AM  I have interviewed the patient and examined the patient. I agree with the findings by the PA. Upset about IV beeping. I will D/C PCA and give MSO4 IV prn and Percocet prn. For right fempop bypass tomorrow by Dr. Fields.   Chris Sundra Haddix, MD [phone]   

## 2016-10-25 ENCOUNTER — Inpatient Hospital Stay (HOSPITAL_COMMUNITY): Payer: Medicaid Other | Admitting: Anesthesiology

## 2016-10-25 ENCOUNTER — Encounter (HOSPITAL_COMMUNITY)
Admission: EM | Disposition: A | Discharge status: Rehabilitation Facility | Payer: Self-pay | Source: Home / Self Care | Attending: Vascular Surgery

## 2016-10-25 ENCOUNTER — Inpatient Hospital Stay (HOSPITAL_COMMUNITY): Payer: Medicaid Other

## 2016-10-25 DIAGNOSIS — I998 Other disorder of circulatory system: Secondary | ICD-10-CM | POA: Diagnosis present

## 2016-10-25 DIAGNOSIS — I70221 Atherosclerosis of native arteries of extremities with rest pain, right leg: Secondary | ICD-10-CM | POA: Diagnosis not present

## 2016-10-25 HISTORY — PX: INTRAOPERATIVE ARTERIOGRAM: SHX5157

## 2016-10-25 HISTORY — PX: FEMORAL-TIBIAL BYPASS GRAFT: SHX938

## 2016-10-25 LAB — CBC
HCT: 34.4 % — ABNORMAL LOW (ref 39.0–52.0)
HCT: 37 % — ABNORMAL LOW (ref 39.0–52.0)
Hemoglobin: 11.4 g/dL — ABNORMAL LOW (ref 13.0–17.0)
Hemoglobin: 12.4 g/dL — ABNORMAL LOW (ref 13.0–17.0)
MCH: 30 pg (ref 26.0–34.0)
MCH: 30.5 pg (ref 26.0–34.0)
MCHC: 33.1 g/dL (ref 30.0–36.0)
MCHC: 33.5 g/dL (ref 30.0–36.0)
MCV: 90.5 fL (ref 78.0–100.0)
MCV: 90.9 fL (ref 78.0–100.0)
PLATELETS: 160 10*3/uL (ref 150–400)
Platelets: 159 10*3/uL (ref 150–400)
RBC: 3.8 MIL/uL — ABNORMAL LOW (ref 4.22–5.81)
RBC: 4.07 MIL/uL — ABNORMAL LOW (ref 4.22–5.81)
RDW: 13.1 % (ref 11.5–15.5)
RDW: 13.1 % (ref 11.5–15.5)
WBC: 6.2 10*3/uL (ref 4.0–10.5)
WBC: 10.3 10*3/uL (ref 4.0–10.5)

## 2016-10-25 LAB — BASIC METABOLIC PANEL
Anion gap: 8 (ref 5–15)
BUN: 6 mg/dL (ref 6–20)
CO2: 25 mmol/L (ref 22–32)
CREATININE: 0.72 mg/dL (ref 0.61–1.24)
Calcium: 8.3 mg/dL — ABNORMAL LOW (ref 8.9–10.3)
Chloride: 107 mmol/L (ref 101–111)
GFR calc non Af Amer: >60 mL/min (ref >60)
Glucose, Bld: 94 mg/dL (ref 65–99)
Potassium: 3.4 mmol/L — ABNORMAL LOW (ref 3.5–5.1)
SODIUM: 140 mmol/L (ref 135–145)

## 2016-10-25 LAB — CREATININE, SERUM
CREATININE: 0.89 mg/dL (ref 0.61–1.24)
GFR calc non Af Amer: >60 mL/min (ref >60)

## 2016-10-25 LAB — GLUCOSE, CAPILLARY
GLUCOSE-CAPILLARY: 98 mg/dL (ref 65–99)
GLUCOSE-CAPILLARY: 84 mg/dL (ref 65–99)
Glucose-Capillary: 123 mg/dL — ABNORMAL HIGH (ref 65–99)

## 2016-10-25 LAB — PROTIME-INR
INR: 1.04
Prothrombin Time: 13.6 seconds (ref 11.4–15.2)

## 2016-10-25 SURGERY — CREATION, BYPASS, ARTERIAL, FEMORAL TO TIBIAL, USING GRAFT
Anesthesia: General | Site: Leg Upper | Laterality: Right

## 2016-10-25 MED ORDER — PANTOPRAZOLE SODIUM 40 MG PO TBEC
40.0000 mg | DELAYED_RELEASE_TABLET | Freq: Every day | ORAL | Status: DC
  Administered 2016-10-25 – 2016-10-28 (×4): 40 mg via ORAL
  Filled 2016-10-25 (×4): qty 1

## 2016-10-25 MED ORDER — HEPARIN SODIUM (PORCINE) 1000 UNIT/ML IJ SOLN
INTRAMUSCULAR | Status: DC | PRN
Start: 2016-10-25 — End: 2016-10-25
  Administered 2016-10-25: 10000 [IU] via INTRAVENOUS

## 2016-10-25 MED ORDER — POTASSIUM CHLORIDE CRYS ER 20 MEQ PO TBCR
20.0000 meq | EXTENDED_RELEASE_TABLET | Freq: Every day | ORAL | Status: AC | PRN
  Administered 2016-10-25: 40 meq via ORAL
  Filled 2016-10-25: qty 2

## 2016-10-25 MED ORDER — HEPARIN SODIUM (PORCINE) 1000 UNIT/ML IJ SOLN
INTRAMUSCULAR | Status: AC
  Filled 2016-10-25: qty 1

## 2016-10-25 MED ORDER — CEFAZOLIN SODIUM 1 G IJ SOLR
INTRAMUSCULAR | Status: AC
  Filled 2016-10-25: qty 20

## 2016-10-25 MED ORDER — LABETALOL HCL 5 MG/ML IV SOLN: 10.0000 mg | INTRAVENOUS | Status: DC | PRN

## 2016-10-25 MED ORDER — ALUM & MAG HYDROXIDE-SIMETH 200-200-20 MG/5ML PO SUSP: 15.0000 mL | ORAL | Status: DC | PRN

## 2016-10-25 MED ORDER — HYDRALAZINE HCL 20 MG/ML IJ SOLN: 5.0000 mg | INTRAMUSCULAR | Status: DC | PRN

## 2016-10-25 MED ORDER — GUAIFENESIN-DM 100-10 MG/5ML PO SYRP: 15.0000 mL | ORAL_SOLUTION | ORAL | Status: DC | PRN

## 2016-10-25 MED ORDER — PROPOFOL 10 MG/ML IV BOLUS
INTRAVENOUS | Status: DC | PRN
  Administered 2016-10-25: 200 mg via INTRAVENOUS

## 2016-10-25 MED ORDER — MIDAZOLAM HCL 2 MG/2ML IJ SOLN
INTRAMUSCULAR | Status: AC
  Filled 2016-10-25: qty 2

## 2016-10-25 MED ORDER — ROCURONIUM BROMIDE 100 MG/10ML IV SOLN
INTRAVENOUS | Status: DC | PRN
  Administered 2016-10-25: 50 mg via INTRAVENOUS
  Administered 2016-10-25: 25 mg via INTRAVENOUS

## 2016-10-25 MED ORDER — NEOSTIGMINE METHYLSULFATE 10 MG/10ML IV SOLN
INTRAVENOUS | Status: DC | PRN
  Administered 2016-10-25: 2.5 mg via INTRAVENOUS

## 2016-10-25 MED ORDER — FENTANYL CITRATE (PF) 250 MCG/5ML IJ SOLN
INTRAMUSCULAR | Status: DC | PRN
  Administered 2016-10-25: 50 ug via INTRAVENOUS
  Administered 2016-10-25: 100 ug via INTRAVENOUS
  Administered 2016-10-25: 150 ug via INTRAVENOUS
  Administered 2016-10-25 (×2): 100 ug via INTRAVENOUS

## 2016-10-25 MED ORDER — MAGNESIUM SULFATE 2 GM/50ML IV SOLN
2.0000 g | Freq: Every day | INTRAVENOUS | Status: DC | PRN
  Filled 2016-10-25: qty 50

## 2016-10-25 MED ORDER — ACETAMINOPHEN 325 MG PO TABS: 325.0000 mg | ORAL_TABLET | ORAL | Status: DC | PRN

## 2016-10-25 MED ORDER — HYDROMORPHONE HCL 1 MG/ML IJ SOLN
INTRAMUSCULAR | Status: AC
  Administered 2016-10-25: 0.5 mg via INTRAVENOUS
  Filled 2016-10-25: qty 1

## 2016-10-25 MED ORDER — SUCCINYLCHOLINE CHLORIDE 20 MG/ML IJ SOLN
INTRAMUSCULAR | Status: DC | PRN
  Administered 2016-10-25: 120 mg via INTRAVENOUS

## 2016-10-25 MED ORDER — METOPROLOL TARTRATE 5 MG/5ML IV SOLN
2.0000 mg | INTRAVENOUS | Status: DC | PRN
Start: 2016-10-25 — End: 2016-10-28

## 2016-10-25 MED ORDER — POLYETHYLENE GLYCOL 3350 17 G PO PACK: 17.0000 g | PACK | Freq: Every day | ORAL | Status: DC | PRN

## 2016-10-25 MED ORDER — ACETAMINOPHEN 650 MG RE SUPP: 325.0000 mg | RECTAL | Status: DC | PRN

## 2016-10-25 MED ORDER — SODIUM CHLORIDE 0.9 % IV SOLN: 500.0000 mL | Freq: Once | INTRAVENOUS | Status: DC | PRN

## 2016-10-25 MED ORDER — PHENYLEPHRINE HCL 10 MG/ML IJ SOLN
INTRAVENOUS | Status: DC | PRN
  Administered 2016-10-25: 25 ug/min via INTRAVENOUS

## 2016-10-25 MED ORDER — IOPAMIDOL (ISOVUE-300) INJECTION 61%
INTRAVENOUS | Status: AC
  Filled 2016-10-25: qty 50

## 2016-10-25 MED ORDER — BISACODYL 10 MG RE SUPP: 10.0000 mg | Freq: Every day | RECTAL | Status: DC | PRN

## 2016-10-25 MED ORDER — LACTATED RINGERS IV SOLN
INTRAVENOUS | Status: DC | PRN
  Administered 2016-10-25 (×3): via INTRAVENOUS

## 2016-10-25 MED ORDER — GLYCOPYRROLATE 0.2 MG/ML IJ SOLN
INTRAMUSCULAR | Status: DC | PRN
  Administered 2016-10-25: .4 mg via INTRAVENOUS

## 2016-10-25 MED ORDER — 0.9 % SODIUM CHLORIDE (POUR BTL) OPTIME
TOPICAL | Status: DC | PRN
  Administered 2016-10-25 (×2): 1000 mL

## 2016-10-25 MED ORDER — DEXTROSE 5 % IV SOLN
1.5000 g | Freq: Two times a day (BID) | INTRAVENOUS | Status: AC
  Administered 2016-10-25 – 2016-10-26 (×2): 1.5 g via INTRAVENOUS
  Filled 2016-10-25 (×2): qty 1.5

## 2016-10-25 MED ORDER — ONDANSETRON HCL 4 MG/2ML IJ SOLN: 4.0000 mg | Freq: Four times a day (QID) | INTRAMUSCULAR | Status: DC | PRN

## 2016-10-25 MED ORDER — IOPAMIDOL (ISOVUE-300) INJECTION 61%
INTRAVENOUS | Status: DC | PRN
  Administered 2016-10-25: 30 mL via INTRAVENOUS

## 2016-10-25 MED ORDER — PHENOL 1.4 % MT LIQD: 1.0000 | OROMUCOSAL | Status: DC | PRN

## 2016-10-25 MED ORDER — DOCUSATE SODIUM 100 MG PO CAPS
100.0000 mg | ORAL_CAPSULE | Freq: Every day | ORAL | Status: DC
  Administered 2016-10-26 – 2016-10-28 (×3): 100 mg via ORAL
  Filled 2016-10-25 (×3): qty 1

## 2016-10-25 MED ORDER — INSULIN ASPART 100 UNIT/ML ~~LOC~~ SOLN
0.0000 [IU] | Freq: Three times a day (TID) | SUBCUTANEOUS | Status: DC
  Administered 2016-10-27 – 2016-10-28 (×2): 2 [IU] via SUBCUTANEOUS

## 2016-10-25 MED ORDER — FENTANYL CITRATE (PF) 250 MCG/5ML IJ SOLN
INTRAMUSCULAR | Status: AC
  Filled 2016-10-25: qty 5

## 2016-10-25 MED ORDER — HYDROMORPHONE HCL 1 MG/ML IJ SOLN
0.2500 mg | INTRAMUSCULAR | Status: DC | PRN
  Administered 2016-10-25 (×2): 0.5 mg via INTRAVENOUS

## 2016-10-25 MED ORDER — HEPARIN SODIUM (PORCINE) 5000 UNIT/ML IJ SOLN
INTRAMUSCULAR | Status: DC | PRN
  Administered 2016-10-25: 08:00:00

## 2016-10-25 MED ORDER — PROPOFOL 10 MG/ML IV BOLUS
INTRAVENOUS | Status: AC
  Filled 2016-10-25: qty 20

## 2016-10-25 MED ORDER — HEPARIN SODIUM (PORCINE) 5000 UNIT/ML IJ SOLN
5000.0000 [IU] | Freq: Three times a day (TID) | INTRAMUSCULAR | Status: DC
  Administered 2016-10-26 – 2016-10-28 (×7): 5000 [IU] via SUBCUTANEOUS
  Filled 2016-10-25 (×8): qty 1

## 2016-10-25 MED ORDER — ONDANSETRON HCL 4 MG/2ML IJ SOLN
INTRAMUSCULAR | Status: DC | PRN
  Administered 2016-10-25: 4 mg via INTRAVENOUS

## 2016-10-25 MED ORDER — LIDOCAINE HCL (CARDIAC) 20 MG/ML IV SOLN
INTRAVENOUS | Status: DC | PRN
  Administered 2016-10-25: 100 mg via INTRATRACHEAL

## 2016-10-25 MED ORDER — MIDAZOLAM HCL 5 MG/5ML IJ SOLN
INTRAMUSCULAR | Status: DC | PRN
  Administered 2016-10-25: 2 mg via INTRAVENOUS

## 2016-10-25 SURGICAL SUPPLY — 58 items
BANDAGE ESMARK 6X9 LF (GAUZE/BANDAGES/DRESSINGS) IMPLANT
BNDG ESMARK 6X9 LF (GAUZE/BANDAGES/DRESSINGS)
CANISTER SUCT 3000ML PPV (MISCELLANEOUS) ×3 IMPLANT
CANNULA VESSEL 3MM 2 BLNT TIP (CANNULA) ×6 IMPLANT
CATH EMB 4FR 80CM (CATHETERS) ×3 IMPLANT
CLIP TI MEDIUM 24 (CLIP) ×3 IMPLANT
CLIP TI WIDE RED SMALL 24 (CLIP) ×3 IMPLANT
CUFF TOURNIQUET SINGLE 24IN (TOURNIQUET CUFF) IMPLANT
CUFF TOURNIQUET SINGLE 34IN LL (TOURNIQUET CUFF) IMPLANT
CUFF TOURNIQUET SINGLE 44IN (TOURNIQUET CUFF) IMPLANT
DERMABOND ADVANCED (GAUZE/BANDAGES/DRESSINGS) ×4
DERMABOND ADVANCED .7 DNX12 (GAUZE/BANDAGES/DRESSINGS) ×8 IMPLANT
DRAIN SNY WOU (WOUND CARE) IMPLANT
DRAPE PROXIMA HALF (DRAPES) IMPLANT
DRAPE X-RAY CASS 24X20 (DRAPES) ×3 IMPLANT
ELECT REM PT RETURN 9FT ADLT (ELECTROSURGICAL) ×3
ELECTRODE REM PT RTRN 9FT ADLT (ELECTROSURGICAL) ×2 IMPLANT
EVACUATOR SILICONE 100CC (DRAIN) IMPLANT
GLOVE BIO SURGEON STRL SZ 6.5 (GLOVE) ×12 IMPLANT
GLOVE BIO SURGEON STRL SZ7.5 (GLOVE) ×3 IMPLANT
GLOVE BIOGEL PI IND STRL 6 (GLOVE) ×2 IMPLANT
GLOVE BIOGEL PI IND STRL 6.5 (GLOVE) ×4 IMPLANT
GLOVE BIOGEL PI IND STRL 7.0 (GLOVE) ×2 IMPLANT
GLOVE BIOGEL PI INDICATOR 6 (GLOVE) ×1
GLOVE BIOGEL PI INDICATOR 6.5 (GLOVE) ×2
GLOVE BIOGEL PI INDICATOR 7.0 (GLOVE) ×1
GLOVE ECLIPSE 6.5 STRL STRAW (GLOVE) ×9 IMPLANT
GLOVE SURG SS PI 6.5 STRL IVOR (GLOVE) ×3 IMPLANT
GOWN STRL NON-REIN LRG LVL3 (GOWN DISPOSABLE) ×3 IMPLANT
GOWN STRL REUS W/ TWL LRG LVL3 (GOWN DISPOSABLE) ×8 IMPLANT
GOWN STRL REUS W/TWL LRG LVL3 (GOWN DISPOSABLE) ×4
HEMOSTAT SPONGE AVITENE ULTRA (HEMOSTASIS) IMPLANT
KIT BASIN OR (CUSTOM PROCEDURE TRAY) ×3 IMPLANT
KIT ROOM TURNOVER OR (KITS) ×3 IMPLANT
NS IRRIG 1000ML POUR BTL (IV SOLUTION) ×6 IMPLANT
PACK PERIPHERAL VASCULAR (CUSTOM PROCEDURE TRAY) ×3 IMPLANT
PAD ARMBOARD 7.5X6 YLW CONV (MISCELLANEOUS) ×6 IMPLANT
SET COLLECT BLD 21X3/4 12 (NEEDLE) ×3 IMPLANT
STAPLER VISISTAT 35W (STAPLE) IMPLANT
STOPCOCK 4 WAY LG BORE MALE ST (IV SETS) ×3 IMPLANT
SUT PROLENE 5 0 C 1 24 (SUTURE) ×6 IMPLANT
SUT PROLENE 6 0 CC (SUTURE) ×18 IMPLANT
SUT PROLENE 7 0 BV 1 (SUTURE) IMPLANT
SUT PROLENE 7 0 BV1 MDA (SUTURE) IMPLANT
SUT SILK 2 0 SH (SUTURE) ×3 IMPLANT
SUT SILK 3 0 (SUTURE) ×3
SUT SILK 3-0 18XBRD TIE 12 (SUTURE) ×6 IMPLANT
SUT VIC AB 2-0 SH 27 (SUTURE) ×2
SUT VIC AB 2-0 SH 27XBRD (SUTURE) ×4 IMPLANT
SUT VIC AB 3-0 SH 27 (SUTURE) ×6
SUT VIC AB 3-0 SH 27X BRD (SUTURE) ×12 IMPLANT
SUT VIC AB 4-0 PS2 27 (SUTURE) ×18 IMPLANT
SYRINGE 3CC LL L/F (MISCELLANEOUS) ×3 IMPLANT
TAPE UMBILICAL COTTON 1/8X30 (MISCELLANEOUS) ×3 IMPLANT
TRAY FOLEY W/METER SILVER 16FR (SET/KITS/TRAYS/PACK) ×3 IMPLANT
TUBING EXTENTION W/L.L. (IV SETS) ×3 IMPLANT
UNDERPAD 30X30 (UNDERPADS AND DIAPERS) ×3 IMPLANT
WATER STERILE IRR 1000ML POUR (IV SOLUTION) ×3 IMPLANT

## 2016-10-25 NOTE — Anesthesia Procedure Notes (Signed)
Procedure Name: Intubation Date/Time: 10/25/2016 7:41 AM Performed by: Marena ChancyBECKNER, Libero Puthoff S Pre-anesthesia Checklist: Patient identified, Emergency Drugs available, Suction available and Patient being monitored Patient Re-evaluated:Patient Re-evaluated prior to inductionOxygen Delivery Method: Circle System Utilized Preoxygenation: Pre-oxygenation with 100% oxygen Intubation Type: IV induction Ventilation: Mask ventilation without difficulty and Oral airway inserted - appropriate to patient size Laryngoscope Size: Miller, 3 and Glidescope Grade View: Grade III Tube type: Oral Tube size: 8.0 mm Number of attempts: 1 Airway Equipment and Method: Stylet,  Oral airway and Video-laryngoscopy Placement Confirmation: ETT inserted through vocal cords under direct vision,  positive ETCO2 and breath sounds checked- equal and bilateral Secured at: 24 cm Tube secured with: Tape Dental Injury: Teeth and Oropharynx as per pre-operative assessment  Difficulty Due To: Difficulty was anticipated and Difficult Airway- due to anterior larynx Future Recommendations: Recommend- induction with short-acting agent, and alternative techniques readily available

## 2016-10-25 NOTE — Anesthesia Postprocedure Evaluation (Signed)
Anesthesia Post Note  Patient: Ronald Forbes  Procedure(s) Performed: Procedure(s) (LRB): BYPASS GRAFT FEMORAL-TIBIAL ARTERY USING NON REVERSED SAPPHENOUS VEIN (Right) INTRA OPERATIVE ARTERIOGRAM (Right)  Patient location during evaluation: PACU Anesthesia Type: General Level of consciousness: awake Pain management: pain level controlled Vital Signs Assessment: post-procedure vital signs reviewed and stable Respiratory status: spontaneous breathing Cardiovascular status: stable Anesthetic complications: no       Last Vitals:  Vitals:   10/25/16 1414 10/25/16 1429  BP: (!) 160/96 (!) 160/98  Pulse: 85 80  Resp: 11 15  Temp:      Last Pain:  Vitals:   10/25/16 1400  TempSrc:   PainSc: 7                  Jeronica Stlouis

## 2016-10-25 NOTE — Transfer of Care (Signed)
Immediate Anesthesia Transfer of Care Note  Patient: Ronald Forbes  Procedure(s) Performed: Procedure(s): BYPASS GRAFT FEMORAL-TIBIAL ARTERY USING NON REVERSED SAPPHENOUS VEIN (Right) INTRA OPERATIVE ARTERIOGRAM (Right)  Patient Location: PACU  Anesthesia Type:General  Level of Consciousness: awake, alert  and oriented  Airway & Oxygen Therapy: Patient Spontanous Breathing and Patient connected to nasal cannula oxygen  Post-op Assessment: Report given to RN, Post -op Vital signs reviewed and stable and Patient moving all extremities X 4  Post vital signs: Reviewed and stable  Last Vitals:  Vitals:   10/24/16 2100 10/25/16 0354  BP: (!) 156/89 (!) 142/83  Pulse: (!) 58 (!) 58  Resp: 18 18  Temp: 37 C 36.4 C    Last Pain:  Vitals:   10/25/16 0452  TempSrc:   PainSc: 7       Patients Stated Pain Goal: 0 (10/23/16 2220)  Complications: No apparent anesthesia complications

## 2016-10-25 NOTE — Progress Notes (Signed)
Pt still complains of foot pain has brisk biphasic PT doppler. Toes slightly dusky.  Will see how he does overnight.  Foot may declare itself now that he has more flow  Fabienne Brunsharles Antwoin Lackey, MD [facility] [place] Office: [phone] Pager: [phone]

## 2016-10-25 NOTE — H&P (View-Only) (Signed)
  Progress Note    10/24/2016 8:46 AM 4 Days Post-Op  Subjective:  Pt disconnected from PCA; frustrated with the beeping and oxygen.  Continues to have pain.  Says he has not had any pain meds bc they haven't brought it to him but he has not asked for it because he forgot to ask.   Tm 99 now afebrile HR 60's-70's NSR 140's-150's systolic 97% RA  Vitals:   10/23/16 2341 10/24/16 0510  BP:  (!) 146/84  Pulse:  61  Resp: 16 13  Temp:  98.7 F (37.1 C)    Physical Exam: Cardiac:  regular Lungs:  Non labored Extremities:  Monophasic PT doppler signal   CBC    Component Value Date/Time   WBC 6.7 10/22/2016 0218   RBC 4.29 10/22/2016 0218   HGB 13.3 10/22/2016 0218   HCT 39.2 10/22/2016 0218   PLT 154 10/22/2016 0218   MCV 91.4 10/22/2016 0218   MCH 31.0 10/22/2016 0218   MCHC 33.9 10/22/2016 0218   RDW 13.3 10/22/2016 0218   LYMPHSABS 2.7 10/20/2016 1234   MONOABS 0.5 10/20/2016 1234   EOSABS 0.1 10/20/2016 1234   BASOSABS 0.0 10/20/2016 1234    BMET    Component Value Date/Time   NA 138 10/22/2016 0218   NA 136 (A) 02/04/2016   K 3.9 10/22/2016 0218   CL 109 10/22/2016 0218   CO2 21 (L) 10/22/2016 0218   GLUCOSE 105 (H) 10/22/2016 0218   BUN 9 10/22/2016 0218   BUN 15 02/04/2016   CREATININE 0.80 10/22/2016 0218   CALCIUM 8.5 (L) 10/22/2016 0218   GFRNONAA >60 10/22/2016 0218   GFRAA >60 10/22/2016 0218    INR    Component Value Date/Time   INR 1.01 02/04/2016 0849    No intake or output data in the 24 hours ending 10/24/16 0846   Assessment:  56 y.o. male is s/p:  Procedure Performed: 1. US guided access of left common femoral artery 2. Aortogram with right lower extremity angiogram 3. Moderate sedation for 15 minutes with fentanyl and versed   4 Days Post-Op  Plan: -plan for right femoropopliteal bypass grafting tomorrow by Dr. Darrick PennaFields; pt pre-op'd and reminded pt nothing to eat or drink after MN -monophasic doppler signal present -PCA  is off and disconnected-he was frustrated with the beeping and O2 sensor.  He says he has not gotten any pain medicine and that they were supposed to bring it to him this morning.  He has not asked for any.  I reminded him again that this is not scheduled and he has to ask for it.  -DVT prophylaxis:  SQ heparin   Doreatha MassedSamantha Brent Noto, PA-C [facility] [phone] 10/24/2016 8:46 AM  I have interviewed the patient and examined the patient. I agree with the findings by the PA. Upset about IV beeping. I will D/C PCA and give MSO4 IV prn and Percocet prn. For right fempop bypass tomorrow by Dr. Darrick PennaFields.   Cari Carawayhris Dickson, MD [phone]

## 2016-10-25 NOTE — Progress Notes (Signed)
  Day of Surgery Note    Subjective:  Awake; pulling at catheter  Vitals:   10/24/16 2100 10/25/16 0354  BP: (!) 156/89 (!) 142/83  Pulse: (!) 58 (!) 58  Resp: 18 18  Temp: 98.6 F (37 C) 97.6 F (36.4 C)    Incisions:   Clean and dry Extremities:  + doppler signal right PT/peroneal Cardiac:  regular Lungs:  Non labored   Assessment/Plan:  This is a 56 y.o. male who is s/p right femoral to tibioperoneal trunk bypass with saphenous vein  -pt with patent bypass graft -continue pain control -to stepdown when bed available   Doreatha MassedSamantha Rudolph Dobler, PA-C 10/25/2016 2:03 PM [phone]

## 2016-10-25 NOTE — Anesthesia Preprocedure Evaluation (Addendum)
Anesthesia Evaluation  Patient identified by MRN, date of birth, ID band Patient awake    Reviewed: Allergy & Precautions, NPO status , Patient's Chart, lab work & pertinent test results  Airway Mallampati: II  TM Distance: >3 FB     Dental   Pulmonary Current Smoker,    breath sounds clear to auscultation       Cardiovascular + Peripheral Vascular Disease   Rhythm:Regular Rate:Normal     Neuro/Psych    GI/Hepatic negative GI ROS, Neg liver ROS,   Endo/Other  diabetes  Renal/GU      Musculoskeletal   Abdominal   Peds  Hematology   Anesthesia Other Findings   Reproductive/Obstetrics                            Anesthesia Physical Anesthesia Plan  ASA: III  Anesthesia Plan:    Post-op Pain Management:    Induction: Intravenous  Airway Management Planned: Oral ETT  Additional Equipment:   Intra-op Plan:   Post-operative Plan: Extubation in OR  Informed Consent: I have reviewed the patients History and Physical, chart, labs and discussed the procedure including the risks, benefits and alternatives for the proposed anesthesia with the patient or authorized representative who has indicated his/her understanding and acceptance.   Dental advisory given  Plan Discussed with: Anesthesiologist and CRNA  Anesthesia Plan Comments:         Anesthesia Quick Evaluation

## 2016-10-25 NOTE — Op Note (Addendum)
Procedure: Right femoral to tibioperoneal trunk bypass with non-reversed ipsilateral great saphenous vein, intraop agram  Preoperative diagnosis: Rest pain right foot  Postoperative diagnosis: Same  Anesthesia: General  Asst.: Doreatha MassedSamantha Rhyne, PA-C  Operative findings:     Calcified vessels, good quality saphenous vein 3-4 mm  Operative details: After obtaining informed consent, the patient was taken to the operating room. The patient was placed in supine position on the operating room table. After induction of general anesthesia and endotracheal intubation, a Foley catheter was placed. Next, the patient's entire right lower extremity was prepped and draped in the usual sterile fashion. A longitudinal incision was then made in the right groin and carried down through the subcutaneous tissues to expose the right common femoral artery.  The common femoral artery was dissected free circumferentially. There was a pulse within the common femoral artery. The distal external iliac artery was dissected free circumferentially underneath the inguinal ligament.  A vessel loop was also placed around the distal external iliac artery. There was a large side branch coming off the common femoral laterally but this did not have the normal course for the profunda.  I dissected out the common femoral over several centimeters but the profunda seemed to be more distal which correlated with the arteriogram.  Next the saphenofemoral junction was identified in the medial portion of the groin incision and this was harvested through several skip incisions on the medial aspect of the leg.  Side branches were ligated and divided between silk ties or clips.  The vein was of good quality 3-4 mm diameter  The vein harvest incision was deepened into the fascia at the below knee segment and the below knee popliteal space was entered.  The popliteal artery was dissected free circumferentially.  The arteriogram showed this was occluded  so I proceeded to take down fibers of the soleus to expose the tibiperoneal trunk and anterior tibial artery.  Vessel loops were placed around these.  The artery was calcified in islands but was clampable.  A tunnel was then created between the heads of the gastrocnemius muscle subsartorial up to the groin.  During this portion the side branch on the common femoral artery was injured and had to be repaired with a 6 0 prolene suture.  The tunneling portion was difficult due the patients large leg.  The vein was ligated distally and at the saphenofemoral junction with a running 5 0 prolene.  The vein was gently distended with heparinized saline and inspected for hemostasis.    The patient was given 10000 units of heparin.  After appropriate circulation time, the distal right external iliac artery was controlled with a vessel loop. The distal common femoral artery was controlled with a vessel loop.   A longitudinal opening was made in the common femoral artery on its anterior surface The vein was placed in a non reversed configuration.  The arteriotomy was extended with Pott's scissors.  The vein was spatulated and sewn end to side to the artery using a running 6 0 Prolene.  Just prior to completion of the anastomosis everything was forebled backbled and thoroughly flushed. 2 repair sutures were placed.  The valves were all then lysed with a valvulotome and there was good pulsatile flow through the graft.  The graft was then brought through the subsartorial tunnel down to the below-knee popliteal artery after marking for orientation. The tibioperoneal trunk was controlled proximally and distally with a fine bulldog clamp. A longitudinal opening was made in the  distal below-knee popliteal artery in an area that was fairly free of calcification but this was a severely diseased artery. The graft was then cut to length and spatulated and sewn end of graft to side of artery using running 6-0 Prolene suture.  At  completion of the anastomosis everything was forebled backbled and thoroughly flushed. The remainder of the anastomosis was completed and all clamps were removed restoring pulsatile flow to the tibioperoneal trunk.. An intraoperative arteriogram was obtained with a 21 gauge butterfly in the proximal vein graft with inflow occlusion.  The distal anastomosis was patent to the tibioperoneal trunk with baseline occlusion of the anterior tibial and 2 vessel runoff PT/peroneal. The needle was removed and hole repaired with a single 6 0 prolene suture. The patient had monophasic to biphasic Doppler flow in the posterior tibial and monophasic dorsalis pedis areas of the foot.   After hemostasis was obtained, the deep layers and subcutaneous layers of the below-knee popliteal incision were closed with running 3-0 Vicryl suture. The skin was closed with staples.   The saphenectomy incisions were closed with running 3 0 vicryl follow by staples.  The groin was inspected and found to be hemostatic. This was then closed in multiple layers of running 2 0 and 3-0 Vicryl suture and 4-0 subcuticular stitch. The patient tolerated the procedure well and there were no complications. Instrument sponge and needle counts correct in the case. Patient was taken to the recovery in stable condition.  Fabienne Bruns, MD [facility] [place] Office: [phone] Pager: [phone]

## 2016-10-25 NOTE — Interval H&P Note (Signed)
History and Physical Interval Note:  10/25/2016 7:08 AM  Ronald Forbes  has presented today for surgery, with the diagnosis of Peripheral vascular disease with right foot rest pain I70.221  The various methods of treatment have been discussed with the patient and family. After consideration of risks, benefits and other options for treatment, the patient has consented to  Procedure(s): BYPASS GRAFT FEMORAL-POPLITEAL ARTERY (Right) as a surgical intervention .  The patient's history has been reviewed, patient examined, no change in status, stable for surgery.  I have reviewed the patient's chart and labs.  Questions were answered to the patient's satisfaction.     Fabienne BrunsFields, Charles

## 2016-10-26 ENCOUNTER — Encounter (HOSPITAL_COMMUNITY): Payer: Self-pay | Admitting: Vascular Surgery

## 2016-10-26 ENCOUNTER — Telehealth: Payer: Self-pay | Admitting: Vascular Surgery

## 2016-10-26 ENCOUNTER — Inpatient Hospital Stay (HOSPITAL_COMMUNITY): Payer: Medicaid Other

## 2016-10-26 DIAGNOSIS — R5381 Other malaise: Secondary | ICD-10-CM

## 2016-10-26 DIAGNOSIS — I739 Peripheral vascular disease, unspecified: Secondary | ICD-10-CM

## 2016-10-26 DIAGNOSIS — G8191 Hemiplegia, unspecified affecting right dominant side: Secondary | ICD-10-CM

## 2016-10-26 DIAGNOSIS — Z95828 Presence of other vascular implants and grafts: Secondary | ICD-10-CM

## 2016-10-26 LAB — CBC
HEMATOCRIT: 33.5 % — AB (ref 39.0–52.0)
HEMOGLOBIN: 11.4 g/dL — AB (ref 13.0–17.0)
MCH: 30.6 pg (ref 26.0–34.0)
MCHC: 34 g/dL (ref 30.0–36.0)
MCV: 89.8 fL (ref 78.0–100.0)
Platelets: 138 10*3/uL — ABNORMAL LOW (ref 150–400)
RBC: 3.73 MIL/uL — AB (ref 4.22–5.81)
RDW: 13.1 % (ref 11.5–15.5)
WBC: 8.1 10*3/uL (ref 4.0–10.5)

## 2016-10-26 LAB — BASIC METABOLIC PANEL
Anion gap: 8 (ref 5–15)
BUN: 5 mg/dL — AB (ref 6–20)
CHLORIDE: 106 mmol/L (ref 101–111)
CO2: 25 mmol/L (ref 22–32)
Calcium: 8.3 mg/dL — ABNORMAL LOW (ref 8.9–10.3)
Creatinine, Ser: 0.8 mg/dL (ref 0.61–1.24)
GFR calc Af Amer: >60 mL/min (ref >60)
GFR calc non Af Amer: >60 mL/min (ref >60)
Glucose, Bld: 107 mg/dL — ABNORMAL HIGH (ref 65–99)
POTASSIUM: 3.6 mmol/L (ref 3.5–5.1)
SODIUM: 139 mmol/L (ref 135–145)

## 2016-10-26 LAB — GLUCOSE, CAPILLARY
GLUCOSE-CAPILLARY: 109 mg/dL — AB (ref 65–99)
GLUCOSE-CAPILLARY: 112 mg/dL — AB (ref 65–99)
GLUCOSE-CAPILLARY: 122 mg/dL — AB (ref 65–99)
GLUCOSE-CAPILLARY: 127 mg/dL — AB (ref 65–99)

## 2016-10-26 NOTE — Progress Notes (Signed)
VASCULAR LAB PRELIMINARY  ARTERIAL  ABI completed: Right ABI of 0.58 is suggestive of moderate arterial occlusive disease at rest. Left ABI of 1.06 is suggestive of arterial flow within normal limits at rest. Unable to obtain right TBI due to low amplitude waveforms. Left TBI of 0.59 is suggestive of abnormal arterial flow at rest.   RIGHT    LEFT    PRESSURE WAVEFORM  PRESSURE WAVEFORM  BRACHIAL 155 Triphasic BRACHIAL 143 Triphasic  DP 90 Monophasic DP 147 Triphasic  PT 82 Monophasic PT 165 Triphasic  GREAT TOE  NA GREAT TOE 91 NA    RIGHT LEFT  ABI 0.58 1.06     Elsie StainGregory J Malli Falotico, RVT 10/26/2016, 11:41 AM

## 2016-10-26 NOTE — Evaluation (Addendum)
Physical Therapy Evaluation Patient Details Name: Ronald HobbyKevin Want MRN: [id number] DOB: 1961-03-06 Today's Date: 10/26/2016   History of Present Illness  56 year old male history of a stroke with residual right-sided weakness who presents now s/p BYPASS GRAFT FEMORAL-TIBIAL ARTERY USING NON REVERSED SAPPHENOUS VEIN (Right)  Clinical Impression  Patient demonstrates deficits in functional mobility as indicated below. Will need continued skilled PT to address deficits and maximize function, will see as indicated and progress as tolerated. Given residual stroke deficits now compounded by extreme pain and limited mobility feel patient would benefit tremendously from short term comprehensive therapies to improve mobility and function to allow for safe discharge.    Follow Up Recommendations CIR    Equipment Recommendations  Wheelchair (measurements PT);Wheelchair cushion (measurements PT)    Recommendations for Other Services Rehab consult     Precautions / Restrictions Precautions Precautions: Fall Restrictions Weight Bearing Restrictions: No      Mobility  Bed Mobility Overal bed mobility: Needs Assistance Bed Mobility: Supine to Sit     Supine to sit: Min assist     General bed mobility comments: increased time and effort to perform, assist to elevate to upright increased cues for positioning (has to go to left side due to residual R side deficits  Transfers Overall transfer level: Needs assistance Equipment used: 2 person hand held assist (attempted RW unable to use RUE, [facility]) Transfers: Sit to/from [employer];Stand Pivot Transfers Sit to Stand: Mod assist;+2 physical assistance Stand pivot transfers: Mod assist;+2 physical assistance       General transfer comment: increased physical assist required for transfer, patient unable to bear weight through RLE due to pain and residual weakness  Ambulation/Gait             General Gait Details: unable to  perform  Stairs            Wheelchair Mobility    Modified Rankin (Stroke Patients Only)       Balance Overall balance assessment: Needs assistance   Sitting balance-Leahy Scale: Good     Standing balance support: Single extremity supported Standing balance-Leahy Scale: Poor Standing balance comment: reliance on UE support                             Pertinent Vitals/Pain Pain Assessment: 0-10 Pain Score: 10-Worst pain ever Pain Location: RLE and foot Pain Descriptors / Indicators: Radiating;Sore Pain Intervention(s): Monitored during session    Home Living Family/patient expects to be discharged to:: Private residence Living Arrangements: Alone Available Help at Discharge: Family;Available PRN/intermittently (sister and brother) Type of Home: Apartment Home Access: Level entry     Home Layout: One level Home Equipment: Walker - 2 wheels      Prior Function Level of Independence: Independent               Hand Dominance   Dominant Hand: Right    Extremity/Trunk Assessment   Upper Extremity Assessment Upper Extremity Assessment: RUE deficits/detail RUE Deficits / Details: limited RUE use due to residual weakness and limited range from prior CVA RUE Sensation: decreased light touch RUE Coordination: decreased fine motor    Lower Extremity Assessment Lower Extremity Assessment: RLE deficits/detail RLE Deficits / Details: residual Right LE deficits from prior CVA, additionally, unable to fully assess at this time dur to pain RLE: Unable to fully assess due to pain RLE Sensation: decreased light touch;decreased proprioception RLE Coordination: decreased fine motor;decreased gross motor  Communication   Communication: Expressive difficulties  Cognition Arousal/Alertness: Awake/alert Behavior During Therapy: Flat affect Overall Cognitive Status: No family/caregiver present to determine baseline cognitive functioning                       General Comments General comments (skin integrity, edema, etc.): educated on positioning of RLE for edema control and pain management    Exercises     Assessment/Plan    PT Assessment Patient needs continued PT services  PT Problem List Decreased strength;Decreased activity tolerance;Decreased balance;Decreased mobility;Decreased coordination;Obesity;Pain       PT Treatment Interventions DME instruction;Gait training;Functional mobility training;Therapeutic activities;Therapeutic exercise;Balance training;Patient/family education    PT Goals (Current goals can be found in the Care Plan section)  Acute Rehab PT Goals Patient Stated Goal: to go home PT Goal Formulation: With patient Time For Goal Achievement: 11/09/16 Potential to Achieve Goals: Good    Frequency Min 3X/week   Barriers to discharge        Co-evaluation PT/OT/SLP Co-Evaluation/Treatment: Yes Reason for Co-Treatment: Complexity of the patient's impairments (multi-system involvement);For patient/therapist safety;To address functional/ADL transfers PT goals addressed during session: Mobility/safety with mobility OT goals addressed during session: ADL's and self-care       End of Session Equipment Utilized During Treatment: Gait belt Activity Tolerance: Patient limited by pain Patient left: in chair;with call bell/phone within reach;with chair alarm set Nurse Communication: Mobility status PT Visit Diagnosis: Difficulty in walking, not elsewhere classified (R26.2)         Time: 1610-9604 PT Time Calculation (min) (ACUTE ONLY): 26 min   Charges:   PT Evaluation $PT Eval Moderate Complexity: 1 Procedure     PT G Codes:         Fabio Asa 11-05-16, 9:02 AM Charlotte Crumb, PT DPT  [phone]

## 2016-10-26 NOTE — Telephone Encounter (Signed)
Spoke to sister about appt for 3/29, advised letter will be mailed.   She wanted to know if the doc will call her to give her some info on the surgery and how the pt is doing post surgery. States no one has called her, advised I would tell you but you may not be able to call due to [place] regulations.

## 2016-10-26 NOTE — Clinical Social Work Note (Signed)
CSW left voicemail for patient's brother with contact information. Will discuss CIR vs. SNF backup upon call back.  Charlynn CourtSarah Eliana Lueth, CSW [phone]

## 2016-10-26 NOTE — Progress Notes (Signed)
5:02 AM       Patient still complaining of RLE pain. Is now saying he is going to refuse pain medicine because he feel like that is part of the problem. Patient still confused. Refuses to ambulate or reposition. Will pass on to upcoming day shift nurse and Dr. Darrick PennaFields in morning report.

## 2016-10-26 NOTE — Plan of Care (Signed)
  Problem: Activity: Goal: Ability to tolerate increased activity will improve Outcome: Progressing   

## 2016-10-26 NOTE — Care Management Note (Signed)
Case Management Note  Patient Details  Name: Ronald Forbes MRN: [id number] Date of Birth: 1961-06-03  Subjective/Objective:    S/p  Right femoral to tibioperoneal trunk bypass, he lives alone but has a relative that can be with him, Yvette RackRuth Ann , his sister n law [place], he has hx of CVA, he is confused a little, oriented to self. Per pt eval rec CIR, there is a CIR consult. Also CSW following for back up.  NCM will cont to follow for dc needs.               Action/Plan:   Expected Discharge Date:                  Expected Discharge Plan:  IP Rehab Facility  In-House Referral:  Clinical Social Work  Discharge planning Services  CM Consult  Post Acute Care Choice:    Choice offered to:     DME Arranged:    DME Agency:     HH Arranged:    HH Agency:     Status of Service:  In process, will continue to follow  If discussed at Long Length of Stay Meetings, dates discussed:    Additional Comments:  Leone Havenaylor, Laura Radilla Clinton, RN 10/26/2016, 12:36 PM

## 2016-10-26 NOTE — Progress Notes (Signed)
Rehab Admissions Coordinator Note:  Patient was screened by Trish MageLogue, Honor Frison M for appropriateness for an Inpatient Acute Rehab Consult.  At this time, we are recommending Inpatient Rehab consult.  Trish MageLogue, Mandie Crabbe M 10/26/2016, 10:44 AM  I can be reached at [phone].

## 2016-10-26 NOTE — Progress Notes (Addendum)
[facility] Progress Note  Subjective  - POD #1  Difficult pain control last night with intermittent confusion per nursing. This am, says his right leg hurts at incision sites. Left foot numb, same as pre-op.   Objective Vitals:   10/25/16 2339 10/26/16 0239  BP: 120/88 (!) 142/74  Pulse: 70 78  Resp: 20 19  Temp: 99.6 F (37.6 C) 99.9 F (37.7 C)    Intake/Output Summary (Last 24 hours) at 10/26/16 0735 Last data filed at 10/26/16 0414  Gross per 24 hour  Intake             3370 ml  Output             5500 ml  Net            -2130 ml    Oriented to person and situation. Unable to identify place, but once told he was in the hospital was able to state he was at [facility] all extremities equally except decreased motor and sensory function right foot.  Some duskiness to distal right foot. Brisk right PT and peroneal doppler signals Right leg incisions intact without hematoma.   Assessment/Planning: 56 y.o. male is s/p: Right femoral to tibioperoneal trunk bypass with non-reversed ipsilateral great saphnenous vein 1 Day Post-Op   Pain seems ok this am. Continue current regimen. Will not increase pain meds given intermittent confusion.  Good doppler flow this am. Numbness is residual from prolonged ischemia pre-op. Hopefully will improve. Will continue to monitor duskiness right toes. PT and OT today. Mobilize and get out of bed. Transfer to 2W.   Raymond Gurney 10/26/2016 7:35 AM -- Agree with above.  Biphasic PT/Peroneal/DP. No hematoma. Mobilization is going to be an issue and social situation. Hopefully d/c Thursday  Fabienne Bruns, MD [facility] [place] Office: [phone] Pager: [phone]  Laboratory CBC    Component Value Date/Time   WBC 8.1 10/26/2016 0211   HGB 11.4 (L) 10/26/2016 0211   HCT 33.5 (L) 10/26/2016 0211   PLT 138 (L) 10/26/2016 0211    BMET    Component Value Date/Time   NA 139  10/26/2016 0211   NA 136 (A) 02/04/2016   K 3.6 10/26/2016 0211   CL 106 10/26/2016 0211   CO2 25 10/26/2016 0211   GLUCOSE 107 (H) 10/26/2016 0211   BUN 5 (L) 10/26/2016 0211   BUN 15 02/04/2016   CREATININE 0.80 10/26/2016 0211   CALCIUM 8.3 (L) 10/26/2016 0211   GFRNONAA >60 10/26/2016 0211   GFRAA >60 10/26/2016 0211    COAG Lab Results  Component Value Date   INR 1.04 10/25/2016   INR 1.01 02/04/2016   No results found for: PTT  Antibiotics Anti-infectives    Start     Dose/Rate Route Frequency Ordered Stop   10/25/16 1800  cefUROXime (ZINACEF) 1.5 g in dextrose 5 % 50 mL IVPB     1.5 g 100 mL/hr over 30 Minutes Intravenous Every 12 hours 10/25/16 1526 10/26/16 0535   10/25/16 0715  ceFAZolin (ANCEF) IVPB 1 g/50 mL premix  Status:  Discontinued    Comments:  Send with pt to OR   1 g 100 mL/hr over 30 Minutes Intravenous On call 10/24/16 0854 10/24/16 1210   10/25/16 0715  ceFAZolin (ANCEF) IVPB 2g/100 mL premix    Comments:  Send with pt to OR   2 g 200 mL/hr over 30 Minutes Intravenous To [facility] 10/24/16 1210  10/25/16 1154   10/22/16 1130  cefUROXime (ZINACEF) 1.5 g in dextrose 5 % 50 mL IVPB     1.5 g 100 mL/hr over 30 Minutes [facility] 10/21/16 2110 10/23/16 1130       Maris BergerKimberly Trinh, [place] JerseyPA-C [facility] Office: [phone] Pager: [phone] 10/26/2016 7:35 AM

## 2016-10-26 NOTE — Plan of Care (Signed)
Problem: Pain Managment: Goal: General experience of comfort will improve Outcome: Not Progressing Patient still reporting RLE pain and is not expressing relief from PRN pain medicine, emotional support, or repositioning.

## 2016-10-26 NOTE — Telephone Encounter (Signed)
-----   Message from Sharee PimpleMarilyn K McChesney, RN sent at 10/26/2016 10:06 AM EDT ----- Regarding: 2 weeks   ----- Message ----- From: Dara LordsSamantha J Rhyne, PA-C Sent: 10/25/2016   1:22 PM To: Vvs Charge Pool  S/p right fem pop bypass 10/25/16.  F/u with Dr. Darrick PennaFields in 2 weeks.  Thanks, Lelon MastSamantha

## 2016-10-26 NOTE — Consult Note (Signed)
Physical Medicine and Rehabilitation Consult Reason for Consult: Debilitation/right femoral to tibioperoneal trunk bypass Referring Physician: Dr. Darrick Penna   HPI: Ronald Forbes is a 56 y.o. right handed male with history of hyperlipidemia, diabetes mellitus, tobacco and alcohol abuse, morbid obesity, CVA with Loop recorder and right side residual weakness June 2017 was discharged to [facility] skilled nursing facility. Per chart review patient lives alone independent with a walker prior to admission. One level entry apartment. He has a sister and brother that check on him. Presented 10/24/2016 with progressive rest pain and numbness to the right lower extremity. Underwent aortogram right lower extremity angiogram that showed occlusion of the right superficial femoral artery as well as anterior tibial artery. Underwent right femoral to tibial peroneal trunk bypass with non-reversed ipsilateral great saphenous vein 10/25/2016 per Dr. Fabienne Bruns. Hospital course pain management. Subcutaneous heparin for DVT prophylaxis. Physical and occupational therapy evaluations completed 10/26/2016 with recommendations of physical medicine rehabilitation consult.   Review of Systems  Constitutional: Negative for chills and fever.       Right side residual weakness from prior CVA  HENT: Negative for hearing loss.   Eyes: Negative for blurred vision and double vision.  Respiratory: Positive for cough and shortness of breath.   Cardiovascular: Positive for leg swelling. Negative for chest pain and palpitations.  Gastrointestinal: Positive for constipation. Negative for nausea and vomiting.  Genitourinary: Positive for urgency. Negative for dysuria and hematuria.  Musculoskeletal: Positive for joint pain and myalgias.       Lower extremity numbness  Skin: Negative for rash.  Neurological: Positive for tingling. Negative for seizures and loss of consciousness.  All other systems reviewed and are  negative.  Past Medical History:  Diagnosis Date  . Alcohol use disorder (HCC)   . Cerebral vascular accident (HCC)   . 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 Diabetes mellitus type 2 in obese (HCC)   . HLD (hyperlipidemia)   . Morbid obesity (HCC)   . Stroke (HCC)   . Tobacco abuse    Past Surgical History:  Procedure Laterality Date  . ABDOMINAL AORTOGRAM W/LOWER EXTREMITY Right 10/20/2016   Procedure: Abdominal Aortogram w/Lower Extremity;  Surgeon: Maeola Harman, MD;  Location: [facility] INVASIVE CV LAB;  Service: Cardiovascular;  Laterality: Right;  lower leg  . EP IMPLANTABLE DEVICE N/A 02/09/2016   Procedure: Loop Recorder Insertion;  Surgeon: Duke Salvia, MD;  Location: [facility] INVASIVE CV LAB;  Service: Cardiovascular;  Laterality: N/A;  . FEMORAL-TIBIAL BYPASS GRAFT Right 10/25/2016   Procedure: BYPASS GRAFT FEMORAL-TIBIAL ARTERY USING NON REVERSED SAPPHENOUS VEIN;  Surgeon: Sherren Kerns, MD;  Location: [facility] OR;  Service: Vascular;  Laterality: Right;  . INTRAOPERATIVE ARTERIOGRAM Right 10/25/2016   Procedure: INTRA OPERATIVE ARTERIOGRAM;  Surgeon: Sherren Kerns, MD;  Location: [facility] OR;  Service: Vascular;  Laterality: Right;  . TEE WITHOUT CARDIOVERSION N/A 02/06/2016   Procedure: TRANSESOPHAGEAL ECHOCARDIOGRAM (TEE);  Surgeon: Wendall Stade, MD;  Location: [facility] ENDOSCOPY;  Service: Cardiovascular;  Laterality: N/A;   No family history on file. Social History:  reports that he has been smoking Cigarettes.  He has a 40.00 pack-year smoking history. He has never used smokeless tobacco. He reports that he drinks alcohol. He reports that he uses drugs, including Marijuana. Allergies:  Allergies  Allergen Reactions  . No Known Allergies    Medications Prior to Admission  Medication Sig Dispense Refill  . aspirin  81 MG chewable tablet Chew 1 tablet (81 mg total) by mouth daily. (Patient not taking: Reported on 10/20/2016) 30  tablet 0  . atorvastatin (LIPITOR) 40 MG tablet Take 1 tablet (40 mg total) by mouth daily at 6 PM. 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, Psychiatrist Status:  Mobility: Bed Mobility Overal bed mobility: Needs Assistance Bed Mobility: Supine to Sit Supine to sit: Min assist General bed mobility comments: increased time toward L side of bed, assist for R LE Transfers Overall transfer level: Needs assistance Equipment used: 2 person hand held assist (unable to use RW) Transfers: Sit to/from Stand, [employer] Transfers Sit to Stand: Mod assist, +2 physical assistance Stand pivot transfers: Mod assist, +2 physical assistance General transfer comment: assist to steady, pt unable to place weight on R LE Ambulation/Gait General Gait Details: unable to perform    ADL: ADL Overall ADL's : Needs assistance/impaired Eating/Feeding: Set up, Sitting Grooming: Wash/dry hands, Wash/dry face, Oral care, Sitting, Set up Upper Body Bathing: Set up, Sitting Lower Body Bathing: Total assistance, Sit to/from stand Upper Body Dressing : Set up, Sitting Lower Body Dressing: Total assistance, Sit to/from stand Toilet Transfer: Minimal assistance, Stand-pivot, +2 for safety/equipment Toilet Transfer Details (indicate cue type and reason): simulated to chair Toileting- Clothing Manipulation and Hygiene: Total assistance, Sit to/from stand Functional mobility during ADLs:  (unable to ambulate) General ADL Comments: Pt  with decreased tolerance of weight on R LE which is also his weak side.  Cognition: Cognition Overall Cognitive Status: No family/caregiver present to determine baseline cognitive functioning Orientation Level: Oriented to person, Disoriented to time, Disoriented to place, Oriented to situation Cognition Arousal/Alertness: Awake/alert Behavior During Therapy: Flat affect Overall Cognitive Status: No family/caregiver present to determine baseline cognitive functioning  Blood pressure 129/72, pulse 66, temperature 98.8 F (37.1 C), temperature source Oral, resp. rate 20, height 6\' 3"  (1.905 m), weight 111.6 kg (246 lb 0.5 oz), SpO2 95 %. Physical Exam  Constitutional:  56 year old right-handed male  HENT:  Head: Normocephalic.  Eyes: EOM are normal.  Neck: Normal range of motion. Neck supple. No thyromegaly present.  Cardiovascular: Normal rate and regular rhythm.   Respiratory: Effort normal and breath sounds normal. No respiratory distress.  GI: Soft. Bowel sounds are normal. He exhibits no distension.  Musculoskeletal: He exhibits edema (1+ RUE and RLE).  Neurological: He is alert.  Patient provides his age, name and date of birth. Follows simple commands. RUE: 3/5 prox to distal. RLE: 2/5 with pain inhibition due to incision. Senses pain in all 4. Reasonable insight and awareness.   Skin:  Lower extremity bypass site with some redness no drainage without odor  Psychiatric: He has a normal mood and affect. His behavior is normal. Thought content normal.    Results for orders placed or performed during the hospital encounter of 10/20/16 (from the past 24 hour(s))  Glucose, capillary     Status: None   Collection Time: 10/25/16  1:47 PM  Result Value Ref Range   Glucose-Capillary 98 65 - 99 mg/dL   Comment 1 Notify RN  Comment 2 Document in Chart   CBC     Status: Abnormal   Collection Time: 10/25/16  4:00 PM  Result Value Ref Range   WBC 10.3 4.0 - 10.5 K/uL   RBC 4.07 (L)  4.22 - 5.81 MIL/uL   Hemoglobin 12.4 (L) 13.0 - 17.0 g/dL   HCT 96.037.0 (L) 45.439.0 - 09.852.0 %   MCV 90.9 78.0 - 100.0 fL   MCH 30.5 26.0 - 34.0 pg   MCHC 33.5 30.0 - 36.0 g/dL   RDW 11.913.1 14.711.5 - 82.915.5 %   Platelets 160 150 - 400 K/uL  Creatinine, serum     Status: None   Collection Time: 10/25/16  4:00 PM  Result Value Ref Range   Creatinine, Ser 0.89 0.61 - 1.24 mg/dL   GFR calc non Af Amer >60 >60 mL/min   GFR calc Af Amer >60 >60 mL/min  Glucose, capillary     Status: None   Collection Time: 10/25/16  4:55 PM  Result Value Ref Range   Glucose-Capillary 84 65 - 99 mg/dL  Glucose, capillary     Status: Abnormal   Collection Time: 10/25/16 10:31 PM  Result Value Ref Range   Glucose-Capillary 123 (H) 65 - 99 mg/dL  CBC     Status: Abnormal   Collection Time: 10/26/16  2:11 AM  Result Value Ref Range   WBC 8.1 4.0 - 10.5 K/uL   RBC 3.73 (L) 4.22 - 5.81 MIL/uL   Hemoglobin 11.4 (L) 13.0 - 17.0 g/dL   HCT 56.233.5 (L) 13.039.0 - 86.552.0 %   MCV 89.8 78.0 - 100.0 fL   MCH 30.6 26.0 - 34.0 pg   MCHC 34.0 30.0 - 36.0 g/dL   RDW 78.413.1 69.611.5 - 29.515.5 %   Platelets 138 (L) 150 - 400 K/uL  Basic metabolic panel     Status: Abnormal   Collection Time: 10/26/16  2:11 AM  Result Value Ref Range   Sodium 139 135 - 145 mmol/L   Potassium 3.6 3.5 - 5.1 mmol/L   Chloride 106 101 - 111 mmol/L   CO2 25 22 - 32 mmol/L   Glucose, Bld 107 (H) 65 - 99 mg/dL   BUN 5 (L) 6 - 20 mg/dL   Creatinine, Ser 2.840.80 0.61 - 1.24 mg/dL   Calcium 8.3 (L) 8.9 - 10.3 mg/dL   GFR calc non Af Amer >60 >60 mL/min   GFR calc Af Amer >60 >60 mL/min   Anion gap 8 5 - 15  Glucose, capillary     Status: Abnormal   Collection Time: 10/26/16  7:35 AM  Result Value Ref Range   Glucose-Capillary 109 (H) 65 - 99 mg/dL   Comment 1 Notify RN    Comment 2 Document in Chart    Dg Tibia/fibula Right  Result Date: 10/25/2016 CLINICAL DATA:  56 year old male with right-sided arteriogram. Intraoperative radiograph. EXAM: RIGHT TIBIA AND FIBULA  - 2 VIEW COMPARISON:  None. FINDINGS: Single lateral arteriogram is submitted. There is a proximal to tibioperoneal trunk bypass which appears patent. The anterior tibial artery occludes proximally. The tibioperoneal trunk, peroneal and posterior tibial artery are patent to just above the ankle. At the level of the ankle the degree of contrast opacification is limited. Whether due to arterial occlusion or contrast bolus timing is on clear. IMPRESSION: Intraoperative arteriogram as above. Electronically Signed   By: Malachy MoanHeath  McCullough M.D.   On: 10/25/2016 12:58    Assessment/Plan: Diagnosis: RLE weakness and pain due to PAD  s/p RLE bypass graft. Has history of baseline right hemiparesis due to old CVA 1. Does the need for close, 24 hr/day medical supervision in concert with the patient's rehab needs make it unreasonable for this patient to be served in a less intensive setting? Yes 2. Co-Morbidities requiring supervision/potential complications: dm, etoh abuse, pain mgt, morbid obesity 3. Due to bladder management, bowel management, safety, skin/wound care, disease management, medication administration, pain management and patient education, does the patient require 24 hr/day rehab nursing? Yes 4. Does the patient require coordinated care of a physician, rehab nurse, PT (1-2 hrs/day, 5 days/week) and OT (1-2 hrs/day, 5 days/week) to address physical and functional deficits in the context of the above medical diagnosis(es)? Yes Addressing deficits in the following areas: balance, endurance, locomotion, strength, transferring, bowel/bladder control, bathing, dressing, feeding, grooming, toileting and psychosocial support 5. Can the patient actively participate in an intensive therapy program of at least 3 hrs of therapy per day at least 5 days per week? Yes 6. The potential for patient to make measurable gains while on inpatient rehab is excellent 7. Anticipated functional outcomes upon discharge from  inpatient rehab are modified independent  with PT, modified independent and supervision with OT, n/a with SLP. 8. Estimated rehab length of stay to reach the above functional goals is: 10-14 9. Does the patient have adequate social supports and living environment to accommodate these discharge functional goals? Yes 10. Anticipated D/C setting: Home 11. Anticipated post D/C treatments: HH therapy and Outpatient therapy 12. Overall Rehab/Functional Prognosis: excellent  RECOMMENDATIONS: This patient's condition is appropriate for continued rehabilitative care in the following setting: CIR Patient has agreed to participate in recommended program. Yes Note that insurance prior authorization may be required for reimbursement for recommended care.  Comment: Rehab Admissions Coordinator to follow up.  Thanks,  [name], MD, [place]    Charlton Amor., PA-C 10/26/2016

## 2016-10-26 NOTE — Evaluation (Signed)
Occupational Therapy Evaluation Patient Details Name: Ronald Forbes MRN: [id number] DOB: [birth year] Today's Date: 10/26/2016    History of Present Illness 56 year old male history of a stroke with residual right-sided weakness who presents now s/p BYPASS GRAFT FEMORAL-TIBIAL ARTERY USING NON REVERSED SAPPHENOUS VEIN (Right)   Clinical Impression   Pt was independent prior to admission. Presents with post op pain and R side weakness from previous CVA. Pt is unable to ambulate, transferred to chair with 2 person assist. Pt is dependent in LB ADL. Pt has potential to progress to modified independent with intensive rehab. Will follow acutely.    Follow Up Recommendations  CIR    Equipment Recommendations  3 in 1 bedside commode;Tub/shower bench;Wheelchair (measurements OT);Wheelchair cushion (measurements OT)    Recommendations for Other Services       Precautions / Restrictions Precautions Precautions: Fall Restrictions Weight Bearing Restrictions: No      Mobility Bed Mobility Overal bed mobility: Needs Assistance Bed Mobility: Supine to Sit     Supine to sit: Min assist     General bed mobility comments: increased time toward L side of bed, assist for R LE  Transfers Overall transfer level: Needs assistance Equipment used: 2 person hand held assist (unable to use RW) Transfers: Sit to/from [employer] Sit to Stand: Mod assist;+2 physical assistance Stand pivot transfers: Mod assist;+2 physical assistance       General transfer comment: assist to steady, pt unable to place weight on R LE    Balance Overall balance assessment: Needs assistance   Sitting balance-Leahy Scale: Good     Standing balance support: Single extremity supported Standing balance-Leahy Scale: Poor Standing balance comment: reliance on UE support                            ADL Overall ADL's : Needs assistance/impaired Eating/Feeding: Set up;Sitting    Grooming: Wash/dry hands;Wash/dry face;Oral care;Sitting;Set up   Upper Body Bathing: Set up;Sitting   Lower Body Bathing: Total assistance;Sit to/from stand   Upper Body Dressing : Set up;Sitting   Lower Body Dressing: Total assistance;Sit to/from stand   Toilet Transfer: Minimal assistance;Stand-pivot;+2 for safety/equipment Toilet Transfer Details (indicate cue type and reason): simulated to chair Toileting- Clothing Manipulation and Hygiene: Total assistance;Sit to/from stand       Functional mobility during ADLs:  (unable to ambulate) General ADL Comments: Pt with decreased tolerance of weight on R LE which is also his weak side.     Vision Patient Visual Report: No change from baseline       Perception     Praxis      Pertinent Vitals/Pain Pain Assessment: 0-10 Pain Score: 10-Worst pain ever Pain Location: RLE and foot Pain Descriptors / Indicators: Radiating;Sore Pain Intervention(s): Monitored during session;Repositioned     Hand Dominance Right   Extremity/Trunk Assessment Upper Extremity Assessment Upper Extremity Assessment: RUE deficits/detail RUE Deficits / Details: limited RUE use due to residual weakness and limited range from prior CVA RUE Sensation: decreased light touch RUE Coordination: decreased fine motor;decreased gross motor (no functional use)   Lower Extremity Assessment Lower Extremity Assessment: Defer to PT evaluation RLE Deficits / Details: residual Right LE deficits from prior CVA, additionally, unable to fully assess at this time dur to pain RLE: Unable to fully assess due to pain RLE Sensation: decreased light touch;decreased proprioception RLE Coordination: decreased fine motor;decreased gross motor       Communication Communication Communication: Expressive difficulties  Cognition Arousal/Alertness: Awake/alert Behavior During Therapy: Flat affect Overall Cognitive Status: No family/caregiver present to determine baseline  cognitive functioning                     General Comments  educated on positioning of RLE for edema control and pain management    Exercises Exercises:  (Bilateral LE AROM (ankle pumps) as tolerated)     Shoulder Instructions      Home Living Family/patient expects to be discharged to:: Private residence Living Arrangements: Alone Available Help at Discharge: Family;Available PRN/intermittently (sister) Type of Home: Apartment Home Access: Level entry     Home Layout: One level     Bathroom Shower/Tub: Chief Strategy OfficerTub/shower unit   Bathroom Toilet: Standard     Home Equipment: Environmental consultantWalker - 2 wheels          Prior Functioning/Environment Level of Independence: Independent        Comments: reports he does not work, drives        OT Problem List: Decreased strength;Decreased activity tolerance;Impaired balance (sitting and/or standing);Decreased coordination;Decreased cognition;Decreased knowledge of use of DME or AE;Pain;Obesity;Impaired UE functional use;Impaired tone;Impaired sensation      OT Treatment/Interventions: Self-care/ADL training;DME and/or AE instruction;Patient/family education;Balance training;Therapeutic activities    OT Goals(Current goals can be found in the care plan section) Acute Rehab OT Goals Patient Stated Goal: to go home OT Goal Formulation: With patient Time For Goal Achievement: 11/09/16 Potential to Achieve Goals: Good ADL Goals Pt Will Perform Grooming: with set-up;sitting Pt Will Perform Lower Body Bathing: with set-up;sitting/lateral leans Pt Will Perform Lower Body Dressing: with set-up;sitting/lateral leans Pt Will Transfer to Toilet: with set-up;stand pivot transfer;bedside commode Pt Will Perform Toileting - Clothing Manipulation and hygiene: with set-up;sitting/lateral leans Pt Will Perform Tub/Shower Transfer: Tub transfer;with supervision;tub bench  OT Frequency: Min 2X/week   Barriers to D/C:            Co-evaluation  PT/OT/SLP Co-Evaluation/Treatment: Yes Reason for Co-Treatment: Complexity of the patient's impairments (multi-system involvement);For patient/therapist safety;To address functional/ADL transfers PT goals addressed during session: Mobility/safety with mobility OT goals addressed during session: ADL's and self-care      End of Session Equipment Utilized During Treatment: Gait belt Nurse Communication: Mobility status  Activity Tolerance: Patient limited by pain Patient left: in bed;with call bell/phone within reach;with chair alarm set  OT Visit Diagnosis: Unsteadiness on feet (R26.81);Hemiplegia and hemiparesis;Pain;Other symptoms and signs involving cognitive function Hemiplegia - Right/Left: Right Hemiplegia - dominant/non-dominant: Dominant Hemiplegia - caused by: Cerebral infarction Pain - Right/Left: Right Pain - part of body: Leg                ADL either performed or assessed with clinical judgement  Time: 0829-0858 OT Time Calculation (min): 29 min Charges:  OT General Charges $OT Visit: 1 Procedure OT Evaluation $OT Eval Moderate Complexity: 1 Procedure G-Codes:     Evern BioMayberry, Dade Rodin [name] 10/26/2016, 9:34 AM  [phone]

## 2016-10-27 LAB — GLUCOSE, CAPILLARY
GLUCOSE-CAPILLARY: 119 mg/dL — AB (ref 65–99)
GLUCOSE-CAPILLARY: 122 mg/dL — AB (ref 65–99)
Glucose-Capillary: 191 mg/dL — ABNORMAL HIGH (ref 65–99)
Glucose-Capillary: 108 mg/dL — ABNORMAL HIGH (ref 65–99)

## 2016-10-27 MED ORDER — HYDROCODONE-ACETAMINOPHEN 5-325 MG PO TABS
1.0000 | ORAL_TABLET | ORAL | Status: DC | PRN
  Administered 2016-10-27 – 2016-10-28 (×6): 2 via ORAL
  Filled 2016-10-27 (×6): qty 2

## 2016-10-27 NOTE — Progress Notes (Signed)
Called and discussed pain medication with Dr Darrick PennaFields and notified him of foot appearance as documented in assessment.

## 2016-10-27 NOTE — Progress Notes (Addendum)
[facility] Progress Note  Subjective  - POD #2  Right foot hurts today. Refusing meds this am.   Objective Vitals:   10/26/16 2026 10/27/16 0514  BP: (!) 151/76 (!) 160/81  Pulse: 81 80  Resp: 18 18  Temp: 99.2 F (37.3 C) 99.7 F (37.6 C)    Intake/Output Summary (Last 24 hours) at 10/27/16 0738 Last data filed at 10/27/16 0700  Gross per 24 hour  Intake             2495 ml  Output             2640 ml  Net             -145 ml   Right leg swollen and foot swollen. Mild erythema around saphenectomy incisions. Right groin incision clean and intact.  Increased duskiness right toes.  PT/peroneal and DP doppler flow right foot.   ABIs 10/26/16   RIGHT    LEFT    PRESSURE WAVEFORM  PRESSURE WAVEFORM  BRACHIAL 155 Triphasic BRACHIAL 143 Triphasic  DP 90 Monophasic DP 147 Triphasic  PT 82 Monophasic PT 165 Triphasic  GREAT TOE  NA GREAT TOE 91 NA    RIGHT LEFT  ABI 0.58 1.06    Assessment/Planning: 56 y.o. male is s/p: Right femoral to tibioperoneal trunk bypass with non-reversed ipsilateral great saphnenous vein Wiggling right toes.  2 Days Post-Op   Increased duskiness right toes. ABIs improved from pre-op. Bypass is patent. Will monitor and allow for demarcation. Refusing morning meds. Discussed need for these medications and patient now amenable.  Continue to mobilize this am.  Normal post-op swelling. Elevate right leg.  Pending CIR consult.  Plan possible d/c to CIR tomorrow if approved.   Raymond GurneyKimberly A Trinh 10/27/2016 7:38 AM -- Still some incisional pain.  Not walking much.  Some periincisional erythema mid thigh most likely bruising. Bypass patent. Right first toe dusky will wait to declare a few weeks Should be able to go to rehab tomorrow Work on mobility today  Fabienne Brunsharles Sharel Behne, MD [facility] [place] Office: [phone] Pager: [phone]  Laboratory CBC    Component Value Date/Time   WBC  8.1 10/26/2016 0211   HGB 11.4 (L) 10/26/2016 0211   HCT 33.5 (L) 10/26/2016 0211   PLT 138 (L) 10/26/2016 0211    BMET    Component Value Date/Time   NA 139 10/26/2016 0211   NA 136 (A) 02/04/2016   K 3.6 10/26/2016 0211   CL 106 10/26/2016 0211   CO2 25 10/26/2016 0211   GLUCOSE 107 (H) 10/26/2016 0211   BUN 5 (L) 10/26/2016 0211   BUN 15 02/04/2016   CREATININE 0.80 10/26/2016 0211   CALCIUM 8.3 (L) 10/26/2016 0211   GFRNONAA >60 10/26/2016 0211   GFRAA >60 10/26/2016 0211    COAG Lab Results  Component Value Date   INR 1.04 10/25/2016   INR 1.01 02/04/2016   No results found for: PTT  Antibiotics Anti-infectives    Start     Dose/Rate Route Frequency Ordered Stop   10/25/16 1800  cefUROXime (ZINACEF) 1.5 g in dextrose 5 % 50 mL IVPB     1.5 g 100 mL/hr over 30 Minutes Intravenous Every 12 hours 10/25/16 1526 10/26/16 0535   10/25/16 0715  ceFAZolin (ANCEF) IVPB 1 g/50 mL premix  Status:  Discontinued    Comments:  Send with pt to OR   1 g 100 mL/hr over 30 Minutes Intravenous  On call 10/24/16 0854 10/24/16 1210   10/25/16 0715  ceFAZolin (ANCEF) IVPB 2g/100 mL premix    Comments:  Send with pt to OR   2 g 200 mL/hr over 30 Minutes Intravenous To [facility] 10/24/16 1210 10/25/16 1154   10/22/16 1130  cefUROXime (ZINACEF) 1.5 g in dextrose 5 % 50 mL IVPB     1.5 g 100 mL/hr over 30 Minutes Intravenous To [facility] 10/21/16 2110 10/23/16 1130       Maris Berger, PA-C [facility] Office: [phone] Pager: [phone] 10/27/2016 7:38 AM

## 2016-10-27 NOTE — Progress Notes (Signed)
Physical Therapy Treatment Patient Details Name: Ronald Forbes MRN: [id number] DOB: [birth year] Today's Date: 10/27/2016    History of Present Illness 56 year old male history of a stroke with residual right-sided weakness who presents now s/p BYPASS GRAFT FEMORAL-TIBIAL ARTERY USING NON REVERSED SAPPHENOUS VEIN (Right)    PT Comments    Pt with discolored/bruised looking R great and second toes. RN aware and called MD. Pt refusing pain medicine but then reports vicadin to have worked in the past. RN aware. Pt was able to tolerate short distasnce ambulation this date. con't to recommend CIR upon d/c to progress to safe mod I level of function.   Follow Up Recommendations  CIR     Equipment Recommendations  Rolling walker with 5" wheels    Recommendations for Other Services Rehab consult     Precautions / Restrictions Precautions Precautions: Fall Precaution Comments: noted R great and second toes are bruised/blue and cool Restrictions Weight Bearing Restrictions: No    Mobility  Bed Mobility Overal bed mobility: Needs Assistance Bed Mobility: Supine to Sit     Supine to sit: Min assist     General bed mobility comments: increased time, minA for R LE management off bed and back into bed  Transfers Overall transfer level: Needs assistance Equipment used: 1 person hand held assist Transfers: Sit to/from Stand Sit to Stand: Min assist         General transfer comment: min guard for safety, pt impulsively stood up grabing onto RW  Ambulation/Gait Ambulation/Gait assistance: Min Environmental consultantassist Ambulation Distance (Feet): 25 Feet Assistive device: Rolling walker (2 wheeled) Gait Pattern/deviations: Antalgic Gait velocity: slow Gait velocity interpretation: Below normal speed for age/gender General Gait Details: step to pattern, increased R LE pain, dec R LE WBing tolerance   Stairs            Wheelchair Mobility    Modified Rankin (Stroke Patients Only)        Balance Overall balance assessment: Needs assistance Sitting-balance support: Feet supported;No upper extremity supported Sitting balance-Leahy Scale: Good     Standing balance support: Bilateral upper extremity supported Standing balance-Leahy Scale: Poor Standing balance comment: reliance on UE support                    Cognition Arousal/Alertness: Awake/alert Behavior During Therapy: Flat affect Overall Cognitive Status: No family/caregiver present to determine baseline cognitive functioning                      Exercises General Exercises - Lower Extremity Ankle Circles/Pumps: AROM;Both;10 reps;Supine (minimal on the R) Quad Sets: AROM;Right;10 reps;Supine Heel Slides: AAROM;Right;10 reps;Supine    General Comments General comments (skin integrity, edema, etc.): educated on R LE elevation, and importance of mobiltiy      Pertinent Vitals/Pain Pain Assessment: 0-10 Pain Score: 8  Pain Location: RLE and foot Pain Descriptors / Indicators: Stabbing Pain Intervention(s):  (RN observed)    Home Living                      Prior Function            PT Goals (current goals can now be found in the care plan section) Acute Rehab PT Goals Patient Stated Goal: go home Progress towards PT goals: Progressing toward goals    Frequency    Min 3X/week      PT Plan Current plan remains appropriate    Co-evaluation  End of Session Equipment Utilized During Treatment: Gait belt Activity Tolerance: Patient limited by pain Patient left: in chair;with call bell/phone within reach;with chair alarm set Nurse Communication: Mobility status PT Visit Diagnosis: Difficulty in walking, not elsewhere classified (R26.2)     Time: 1610-9604 PT Time Calculation (min) (ACUTE ONLY): 33 min  Charges:  $Gait Training: 8-22 mins $Therapeutic Exercise: 8-22 mins                    G Codes:       Yon Schiffman M Zeynab Klett 25-Nov-2016, 2:30  PM   [name] Shock, PT, DPT Pager #: [phone] Office #: [phone]

## 2016-10-27 NOTE — Clinical Social Work Note (Signed)
Patient transferred from 4E to 2W. Handoff information given to 2W CSW.  This CSW signing off.  Charlynn CourtSarah Rue Tinnel, CSW [phone]

## 2016-10-28 ENCOUNTER — Inpatient Hospital Stay (HOSPITAL_COMMUNITY)
Admission: RE | Admit: 2016-10-28 | Discharge: 2016-11-01 | DRG: 949 | Disposition: A | Discharge status: Home Health | Payer: Medicaid Other | Source: Intra-hospital | Attending: Physical Medicine & Rehabilitation | Admitting: Physical Medicine & Rehabilitation

## 2016-10-28 DIAGNOSIS — I69920 Aphasia following unspecified cerebrovascular disease: Secondary | ICD-10-CM

## 2016-10-28 DIAGNOSIS — E785 Hyperlipidemia, unspecified: Secondary | ICD-10-CM | POA: Diagnosis present

## 2016-10-28 DIAGNOSIS — Z48812 Encounter for surgical aftercare following surgery on the circulatory system: Principal | ICD-10-CM

## 2016-10-28 DIAGNOSIS — D62 Acute posthemorrhagic anemia: Secondary | ICD-10-CM | POA: Diagnosis present

## 2016-10-28 DIAGNOSIS — Z6831 Body mass index (BMI) 31.0-31.9, adult: Secondary | ICD-10-CM | POA: Diagnosis not present

## 2016-10-28 DIAGNOSIS — I69351 Hemiplegia and hemiparesis following cerebral infarction affecting right dominant side: Secondary | ICD-10-CM | POA: Diagnosis not present

## 2016-10-28 DIAGNOSIS — E1151 Type 2 diabetes mellitus with diabetic peripheral angiopathy without gangrene: Secondary | ICD-10-CM | POA: Diagnosis present

## 2016-10-28 DIAGNOSIS — I739 Peripheral vascular disease, unspecified: Secondary | ICD-10-CM | POA: Diagnosis not present

## 2016-10-28 DIAGNOSIS — I10 Essential (primary) hypertension: Secondary | ICD-10-CM | POA: Diagnosis present

## 2016-10-28 DIAGNOSIS — E46 Unspecified protein-calorie malnutrition: Secondary | ICD-10-CM | POA: Diagnosis present

## 2016-10-28 DIAGNOSIS — I69320 Aphasia following cerebral infarction: Secondary | ICD-10-CM | POA: Diagnosis not present

## 2016-10-28 DIAGNOSIS — I998 Other disorder of circulatory system: Secondary | ICD-10-CM | POA: Diagnosis present

## 2016-10-28 DIAGNOSIS — Z95828 Presence of other vascular implants and grafts: Secondary | ICD-10-CM | POA: Diagnosis not present

## 2016-10-28 DIAGNOSIS — E1142 Type 2 diabetes mellitus with diabetic polyneuropathy: Secondary | ICD-10-CM | POA: Diagnosis present

## 2016-10-28 DIAGNOSIS — Z79899 Other long term (current) drug therapy: Secondary | ICD-10-CM

## 2016-10-28 DIAGNOSIS — I69359 Hemiplegia and hemiparesis following cerebral infarction affecting unspecified side: Secondary | ICD-10-CM

## 2016-10-28 DIAGNOSIS — R5381 Other malaise: Secondary | ICD-10-CM

## 2016-10-28 DIAGNOSIS — K5900 Constipation, unspecified: Secondary | ICD-10-CM | POA: Diagnosis present

## 2016-10-28 DIAGNOSIS — F17210 Nicotine dependence, cigarettes, uncomplicated: Secondary | ICD-10-CM | POA: Diagnosis present

## 2016-10-28 DIAGNOSIS — E876 Hypokalemia: Secondary | ICD-10-CM | POA: Diagnosis present

## 2016-10-28 DIAGNOSIS — E8809 Other disorders of plasma-protein metabolism, not elsewhere classified: Secondary | ICD-10-CM

## 2016-10-28 DIAGNOSIS — Z7982 Long term (current) use of aspirin: Secondary | ICD-10-CM

## 2016-10-28 LAB — CBC
HCT: 34.4 % — ABNORMAL LOW (ref 39.0–52.0)
HEMOGLOBIN: 11.6 g/dL — AB (ref 13.0–17.0)
MCH: 30.6 pg (ref 26.0–34.0)
MCHC: 33.7 g/dL (ref 30.0–36.0)
MCV: 90.8 fL (ref 78.0–100.0)
Platelets: 140 10*3/uL — ABNORMAL LOW (ref 150–400)
RBC: 3.79 MIL/uL — AB (ref 4.22–5.81)
RDW: 13.3 % (ref 11.5–15.5)
WBC: 7.7 10*3/uL (ref 4.0–10.5)

## 2016-10-28 LAB — GLUCOSE, CAPILLARY
GLUCOSE-CAPILLARY: 107 mg/dL — AB (ref 65–99)
GLUCOSE-CAPILLARY: 130 mg/dL — AB (ref 65–99)
GLUCOSE-CAPILLARY: 121 mg/dL — AB (ref 65–99)
GLUCOSE-CAPILLARY: 119 mg/dL — AB (ref 65–99)

## 2016-10-28 LAB — CREATININE, SERUM
Creatinine, Ser: 0.71 mg/dL (ref 0.61–1.24)
GFR calc Af Amer: >60 mL/min (ref >60)
GFR calc non Af Amer: >60 mL/min (ref >60)

## 2016-10-28 MED ORDER — HEPARIN SODIUM (PORCINE) 5000 UNIT/ML IJ SOLN
5000.0000 [IU] | Freq: Three times a day (TID) | INTRAMUSCULAR | Status: DC
  Administered 2016-10-28 – 2016-11-01 (×11): 5000 [IU] via SUBCUTANEOUS
  Filled 2016-10-28 (×10): qty 1

## 2016-10-28 MED ORDER — ASPIRIN 81 MG PO CHEW
81.0000 mg | CHEWABLE_TABLET | Freq: Every day | ORAL | Status: DC
  Administered 2016-10-29 – 2016-11-01 (×4): 81 mg via ORAL
  Filled 2016-10-28 (×4): qty 1

## 2016-10-28 MED ORDER — ONDANSETRON HCL 4 MG PO TABS: 4.0000 mg | ORAL_TABLET | Freq: Four times a day (QID) | ORAL | Status: DC | PRN

## 2016-10-28 MED ORDER — HYDROCODONE-ACETAMINOPHEN 5-325 MG PO TABS: 1.0000 | ORAL_TABLET | ORAL | 0 refills | Status: DC | PRN

## 2016-10-28 MED ORDER — ACETAMINOPHEN 650 MG RE SUPP: 325.0000 mg | RECTAL | Status: DC | PRN

## 2016-10-28 MED ORDER — INSULIN ASPART 100 UNIT/ML ~~LOC~~ SOLN
0.0000 [IU] | Freq: Three times a day (TID) | SUBCUTANEOUS | Status: DC
  Administered 2016-10-28 – 2016-10-31 (×6): 2 [IU] via SUBCUTANEOUS

## 2016-10-28 MED ORDER — SORBITOL 70 % SOLN: 30.0000 mL | Freq: Every day | Status: DC | PRN

## 2016-10-28 MED ORDER — ONDANSETRON HCL 4 MG/2ML IJ SOLN: 4.0000 mg | Freq: Four times a day (QID) | INTRAMUSCULAR | Status: DC | PRN

## 2016-10-28 MED ORDER — PANTOPRAZOLE SODIUM 40 MG PO TBEC
40.0000 mg | DELAYED_RELEASE_TABLET | Freq: Every day | ORAL | Status: DC
  Administered 2016-10-29 – 2016-11-01 (×4): 40 mg via ORAL
  Filled 2016-10-28 (×4): qty 1

## 2016-10-28 MED ORDER — DOCUSATE SODIUM 100 MG PO CAPS
100.0000 mg | ORAL_CAPSULE | Freq: Every day | ORAL | Status: DC
  Administered 2016-10-29 – 2016-11-01 (×4): 100 mg via ORAL
  Filled 2016-10-28 (×5): qty 1

## 2016-10-28 MED ORDER — HYDROCODONE-ACETAMINOPHEN 5-325 MG PO TABS
1.0000 | ORAL_TABLET | ORAL | Status: DC | PRN
  Administered 2016-10-28 – 2016-11-01 (×19): 2 via ORAL
  Filled 2016-10-28 (×19): qty 2

## 2016-10-28 MED ORDER — HEPARIN SODIUM (PORCINE) 5000 UNIT/ML IJ SOLN: 5000.0000 [IU] | Freq: Three times a day (TID) | INTRAMUSCULAR | Status: DC

## 2016-10-28 MED ORDER — BISACODYL 10 MG RE SUPP: 10.0000 mg | Freq: Every day | RECTAL | Status: DC | PRN

## 2016-10-28 MED ORDER — POLYETHYLENE GLYCOL 3350 17 G PO PACK
17.0000 g | PACK | Freq: Every day | ORAL | Status: DC | PRN
  Administered 2016-10-29: 17 g via ORAL
  Filled 2016-10-28 (×2): qty 1

## 2016-10-28 MED ORDER — ACETAMINOPHEN 325 MG PO TABS: 325.0000 mg | ORAL_TABLET | ORAL | Status: DC | PRN

## 2016-10-28 MED ORDER — TRAZODONE HCL 50 MG PO TABS
50.0000 mg | ORAL_TABLET | Freq: Every evening | ORAL | Status: DC | PRN
  Administered 2016-10-28 – 2016-10-31 (×4): 50 mg via ORAL
  Filled 2016-10-28 (×4): qty 1

## 2016-10-28 MED ORDER — ATORVASTATIN CALCIUM 40 MG PO TABS
40.0000 mg | ORAL_TABLET | Freq: Every day | ORAL | Status: DC
  Administered 2016-10-28 – 2016-10-31 (×4): 40 mg via ORAL
  Filled 2016-10-28 (×4): qty 1

## 2016-10-28 NOTE — Progress Notes (Signed)
Pt going to CIR today, room 4W24, [facility] Pt will be transferred via wheelchair to Rehab unit soon. Will inform pt of new room number.

## 2016-10-28 NOTE — Progress Notes (Addendum)
Pt transferred via wheelchair with belongings to CIR--room 4W24, escorted by unit NT. Pt will continue therapy for mobility, education, wound care, and pain management on rehab unit.

## 2016-10-28 NOTE — Progress Notes (Signed)
[facility]   Subjective  - No change.  Still having pain and swelling.   Objective (!) 156/87 67 98 F (36.7 C) (Oral) 18 96%  Intake/Output Summary (Last 24 hours) at 10/28/16 0735 Last data filed at 10/28/16 0620  Gross per 24 hour  Intake              960 ml  Output              750 ml  Net              210 ml    Right LE edema moderate, incisions soft with erythema at each incision site very mild. Right groin soft without hematoma Toes with slight duskiness, no change from yesterday Minimal active range of motion left ankle, no active toe motion By pass patent with doppler signals   Assessment/Planning: POD # 3 56 y.o. male is s/p: Right femoral to tibioperoneal trunkbypass with non-reversed ipsilateral great saphnenous vein Wiggling right toes.   Very depressed about his situation.  ABI improved since surgery.  Patent by pass. Mobility important ready for discharge to CIR pending bed availability.   Encourage elevation of the right LE for edema.  Clinton GallantCOLLINS, Reyann Troop [facility] CenterMAUREEN 10/28/2016 7:35 AM --  Laboratory Lab Results:  Recent Labs  10/25/16 1600 10/26/16 0211  WBC 10.3 8.1  HGB 12.4* 11.4*  HCT 37.0* 33.5*  PLT 160 138*   BMET  Recent Labs  10/25/16 1600 10/26/16 0211  NA  --  139  K  --  3.6  CL  --  106  CO2  --  25  GLUCOSE  --  107*  BUN  --  5*  CREATININE 0.89 0.80  CALCIUM  --  8.3*    COAG Lab Results  Component Value Date   INR 1.04 10/25/2016   INR 1.01 02/04/2016   No results found for: PTT

## 2016-10-28 NOTE — Care Management Note (Signed)
Case Management Note Previous CM note initiated by Leone Havenaylor, Deborah Clinton, RN--10/26/2016, 12:36 PM   Patient Details  Name: Ronald Forbes MRN: [id number] Date of Birth: 18-Aug-1960  Subjective/Objective:    S/p  Right femoral to tibioperoneal trunk bypass, he lives alone but has a relative that can be with him, Ronald Forbes , his sister n law who lives in [place], he has hx of CVA, he is confused a little, oriented to self. Per pt eval rec CIR, there is a CIR consult. Also CSW following for back up.  NCM will cont to follow for dc needs.               Action/Plan:   Expected Discharge Date:  10/28/16               Expected Discharge Plan:  IP Rehab Facility  In-House Referral:  Clinical Social Work  Discharge planning Services  CM Consult  Post Acute Care Choice:    Choice offered to:     DME Arranged:    DME Agency:     HH Arranged:    HH Agency:     Status of Service:  Completed, signed off  If discussed at [employer] Length of Stay Meetings, dates discussed:  3/13  Discharge Disposition: [facility]   Additional Comments:  10/28/16- 1000 - Ronald Nunley rN, CM- pt stable for d/c today- per Genie with CIR they do have bed available today and can admit pt later today. Plan will be to d/c to CIR later today.   Zenda AlpersWebster, WilkersonKristi Hall, RN 10/28/2016, 10:52 AM [phone]

## 2016-10-28 NOTE — Progress Notes (Signed)
Ronald Oyster, MD Physician Signed Physical Medicine and Rehabilitation  Consult Note Date of Service: 10/26/2016 11:34 AM  Related encounter: ED to Hosp-Admission (Discharged) from 10/20/2016 in [facility] 2W CARDIAC UNIT     Expand All Collapse All   [] Hide copied text [] Hover for attribution information      Physical Medicine and Rehabilitation Consult Reason for Consult: Debilitation/right femoral to tibioperoneal trunk bypass Referring Physician: Dr. Darrick Penna   HPI: Ronald Forbes is a 56 y.o. right handed male with history of hyperlipidemia, diabetes mellitus, tobacco and alcohol abuse, morbid obesity, CVA with Loop recorder and right side residual weakness June 2017 was discharged to [facility] skilled nursing facility. Per chart review patient lives alone independent with a walker prior to admission. One level entry apartment. He has a sister and brother that check on him. Presented 10/24/2016 with progressive rest pain and numbness to the right lower extremity. Underwent aortogram right lower extremity angiogram that showed occlusion of the right superficial femoral artery as well as anterior tibial artery. Underwent right femoral to tibial peroneal trunk bypass with non-reversed ipsilateral great saphenous vein 10/25/2016 per Dr. Fabienne Bruns. Hospital course pain management. Subcutaneous heparin for DVT prophylaxis. Physical and occupational therapy evaluations completed 10/26/2016 with recommendations of physical medicine rehabilitation consult.   Review of Systems  Constitutional: Negative for chills and fever.       Right side residual weakness from prior CVA  HENT: Negative for hearing loss.   Eyes: Negative for blurred vision and double vision.  Respiratory: Positive for cough and shortness of breath.   Cardiovascular: Positive for leg swelling. Negative for chest pain and palpitations.  Gastrointestinal: Positive for constipation. Negative for  nausea and vomiting.  Genitourinary: Positive for urgency. Negative for dysuria and hematuria.  Musculoskeletal: Positive for joint pain and myalgias.       Lower extremity numbness  Skin: Negative for rash.  Neurological: Positive for tingling. Negative for seizures and loss of consciousness.  All other systems reviewed and are negative.      Past Medical History:  Diagnosis Date  . Alcohol use disorder (HCC)   . Cerebral vascular accident (HCC)   . 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 Diabetes mellitus type 2 in obese (HCC)   . HLD (hyperlipidemia)   . Morbid obesity (HCC)   . Stroke (HCC)   . Tobacco abuse         Past Surgical History:  Procedure Laterality Date  . ABDOMINAL AORTOGRAM W/LOWER EXTREMITY Right 10/20/2016   Procedure: Abdominal Aortogram w/Lower Extremity;  Surgeon: Maeola Harman, MD;  Location: [facility] INVASIVE CV LAB;  Service: Cardiovascular;  Laterality: Right;  lower leg  . EP IMPLANTABLE DEVICE N/A 02/09/2016   Procedure: Loop Recorder Insertion;  Surgeon: Duke Salvia, MD;  Location: [facility] INVASIVE CV LAB;  Service: Cardiovascular;  Laterality: N/A;  . FEMORAL-TIBIAL BYPASS GRAFT Right 10/25/2016   Procedure: BYPASS GRAFT FEMORAL-TIBIAL ARTERY USING NON REVERSED SAPPHENOUS VEIN;  Surgeon: Sherren Kerns, MD;  Location: [facility] OR;  Service: Vascular;  Laterality: Right;  . INTRAOPERATIVE ARTERIOGRAM Right 10/25/2016   Procedure: INTRA OPERATIVE ARTERIOGRAM;  Surgeon: Sherren Kerns, MD;  Location: [facility] OR;  Service: Vascular;  Laterality: Right;  . TEE WITHOUT CARDIOVERSION N/A 02/06/2016   Procedure: TRANSESOPHAGEAL ECHOCARDIOGRAM (TEE);  Surgeon: Wendall Stade, MD;  Location: [facility] ENDOSCOPY;  Service: Cardiovascular;  Laterality: N/A;   No family history on file. Social  History:  reports that he has been smoking Cigarettes.  He has a 40.00 pack-year smoking history. He has never used  smokeless tobacco. He reports that he drinks alcohol. He reports that he uses drugs, including Marijuana. Allergies:      Allergies  Allergen Reactions  . No Known Allergies          Medications Prior to Admission  Medication Sig Dispense Refill  . aspirin 81 MG chewable tablet Chew 1 tablet (81 mg total) by mouth daily. (Patient not taking: Reported on 10/20/2016) 30 tablet 0  . atorvastatin (LIPITOR) 40 MG tablet Take 1 tablet (40 mg total) by mouth daily at 6 PM. (Patient not taking: Reported on 10/20/2016) 30 tablet 0    Home: Home Living Family/patient expects to be discharged to:: Private residence Living Arrangements: Alone Available Help at Discharge: Family, Available PRN/intermittently (sister) Type of Home: Apartment Home Access: Level entry Home Layout: One level Bathroom Shower/Tub: Engineer, manufacturing systemsTub/shower unit Bathroom Toilet: Standard Home Equipment: Environmental consultantWalker - 2 wheels  Functional History: Prior Function Level of Independence: Independent Comments: reports he does not work, Psychiatristdrives Functional Status:  Mobility: Bed Mobility Overal bed mobility: Needs Assistance Bed Mobility: Supine to Sit Supine to sit: Min assist General bed mobility comments: increased time toward L side of bed, assist for R LE Transfers Overall transfer level: Needs assistance Equipment used: 2 person hand held assist (unable to use RW) Transfers: Sit to/from Stand, Anadarko Petroleum CorporationStand Pivot Transfers Sit to Stand: Mod assist, +2 physical assistance Stand pivot transfers: Mod assist, +2 physical assistance General transfer comment: assist to steady, pt unable to place weight on R LE Ambulation/Gait General Gait Details: unable to perform  ADL: ADL Overall ADL's : Needs assistance/impaired Eating/Feeding: Set up, Sitting Grooming: Wash/dry hands, Wash/dry face, Oral care, Sitting, Set up Upper Body Bathing: Set up, Sitting Lower Body Bathing: Total assistance, Sit to/from stand Upper Body Dressing : Set up,  Sitting Lower Body Dressing: Total assistance, Sit to/from stand Toilet Transfer: Minimal assistance, Stand-pivot, +2 for safety/equipment Toilet Transfer Details (indicate cue type and reason): simulated to chair Toileting- Clothing Manipulation and Hygiene: Total assistance, Sit to/from stand Functional mobility during ADLs:  (unable to ambulate) General ADL Comments: Pt with decreased tolerance of weight on R LE which is also his weak side.  Cognition: Cognition Overall Cognitive Status: No family/caregiver present to determine baseline cognitive functioning Orientation Level: Oriented to person, Disoriented to time, Disoriented to place, Oriented to situation Cognition Arousal/Alertness: Awake/alert Behavior During Therapy: Flat affect Overall Cognitive Status: No family/caregiver present to determine baseline cognitive functioning  Blood pressure 129/72, pulse 66, temperature 98.8 F (37.1 C), temperature source Oral, resp. rate 20, height 6\' 3"  (1.905 m), weight 111.6 kg (246 lb 0.5 oz), SpO2 95 %. Physical Exam  Constitutional:  56 year old right-handed male  HENT:  Head: Normocephalic.  Eyes: EOM are normal.  Neck: Normal range of motion. Neck supple. No thyromegaly present.  Cardiovascular: Normal rate and regular rhythm.   Respiratory: Effort normal and breath sounds normal. No respiratory distress.  GI: Soft. Bowel sounds are normal. He exhibits no distension.  Musculoskeletal: He exhibits edema (1+ RUE and RLE).  Neurological: He is alert.  Patient provides his age, name and date of birth. Follows simple commands. RUE: 3/5 prox to distal. RLE: 2/5 with pain inhibition due to incision. Senses pain in all 4. Reasonable insight and awareness.   Skin:  Lower extremity bypass site with some redness no drainage without odor  Psychiatric: He has  a normal mood and affect. His behavior is normal. Thought content normal.    Lab Results Last 24 Hours       Results for  orders placed or performed during the hospital encounter of 10/20/16 (from the past 24 hour(s))  Glucose, capillary     Status: None   Collection Time: 10/25/16  1:47 PM  Result Value Ref Range   Glucose-Capillary 98 65 - 99 mg/dL   Comment 1 Notify RN    Comment 2 Document in Chart   CBC     Status: Abnormal   Collection Time: 10/25/16  4:00 PM  Result Value Ref Range   WBC 10.3 4.0 - 10.5 K/uL   RBC 4.07 (L) 4.22 - 5.81 MIL/uL   Hemoglobin 12.4 (L) 13.0 - 17.0 g/dL   HCT 16.1 (L) 09.6 - 04.5 %   MCV 90.9 78.0 - 100.0 fL   MCH 30.5 26.0 - 34.0 pg   MCHC 33.5 30.0 - 36.0 g/dL   RDW 40.9 81.1 - 91.4 %   Platelets 160 150 - 400 K/uL  Creatinine, serum     Status: None   Collection Time: 10/25/16  4:00 PM  Result Value Ref Range   Creatinine, Ser 0.89 0.61 - 1.24 mg/dL   GFR calc non Af Amer >60 >60 mL/min   GFR calc Af Amer >60 >60 mL/min  Glucose, capillary     Status: None   Collection Time: 10/25/16  4:55 PM  Result Value Ref Range   Glucose-Capillary 84 65 - 99 mg/dL  Glucose, capillary     Status: Abnormal   Collection Time: 10/25/16 10:31 PM  Result Value Ref Range   Glucose-Capillary 123 (H) 65 - 99 mg/dL  CBC     Status: Abnormal   Collection Time: 10/26/16  2:11 AM  Result Value Ref Range   WBC 8.1 4.0 - 10.5 K/uL   RBC 3.73 (L) 4.22 - 5.81 MIL/uL   Hemoglobin 11.4 (L) 13.0 - 17.0 g/dL   HCT 78.2 (L) 95.6 - 21.3 %   MCV 89.8 78.0 - 100.0 fL   MCH 30.6 26.0 - 34.0 pg   MCHC 34.0 30.0 - 36.0 g/dL   RDW 08.6 57.8 - 46.9 %   Platelets 138 (L) 150 - 400 K/uL  Basic metabolic panel     Status: Abnormal   Collection Time: 10/26/16  2:11 AM  Result Value Ref Range   Sodium 139 135 - 145 mmol/L   Potassium 3.6 3.5 - 5.1 mmol/L   Chloride 106 101 - 111 mmol/L   CO2 25 22 - 32 mmol/L   Glucose, Bld 107 (H) 65 - 99 mg/dL   BUN 5 (L) 6 - 20 mg/dL   Creatinine, Ser 6.29 0.61 - 1.24 mg/dL   Calcium 8.3 (L) 8.9 - 10.3 mg/dL    GFR calc non Af Amer >60 >60 mL/min   GFR calc Af Amer >60 >60 mL/min   Anion gap 8 5 - 15  Glucose, capillary     Status: Abnormal   Collection Time: 10/26/16  7:35 AM  Result Value Ref Range   Glucose-Capillary 109 (H) 65 - 99 mg/dL   Comment 1 Notify RN    Comment 2 Document in Chart       Imaging Results (Last 48 hours)  Dg Tibia/fibula Right  Result Date: 10/25/2016 CLINICAL DATA:  56 year old male with right-sided arteriogram. Intraoperative radiograph. EXAM: RIGHT TIBIA AND FIBULA - 2 VIEW COMPARISON:  None. FINDINGS: Single lateral  arteriogram is submitted. There is a proximal to tibioperoneal trunk bypass which appears patent. The anterior tibial artery occludes proximally. The tibioperoneal trunk, peroneal and posterior tibial artery are patent to just above the ankle. At the level of the ankle the degree of contrast opacification is limited. Whether due to arterial occlusion or contrast bolus timing is on clear. IMPRESSION: Intraoperative arteriogram as above. Electronically Signed   By: Malachy Moan M.D.   On: 10/25/2016 12:58     Assessment/Plan: Diagnosis: RLE weakness and pain due to PAD s/p RLE bypass graft. Has history of baseline right hemiparesis due to old CVA 1. Does the need for close, 24 hr/day medical supervision in concert with the patient's rehab needs make it unreasonable for this patient to be served in a less intensive setting? Yes 2. Co-Morbidities requiring supervision/potential complications: dm, etoh abuse, pain mgt, morbid obesity 3. Due to bladder management, bowel management, safety, skin/wound care, disease management, medication administration, pain management and patient education, does the patient require 24 hr/day rehab nursing? Yes 4. Does the patient require coordinated care of a physician, rehab nurse, PT (1-2 hrs/day, 5 days/week) and OT (1-2 hrs/day, 5 days/week) to address physical and functional deficits in the context of the  above medical diagnosis(es)? Yes Addressing deficits in the following areas: balance, endurance, locomotion, strength, transferring, bowel/bladder control, bathing, dressing, feeding, grooming, toileting and psychosocial support 5. Can the patient actively participate in an intensive therapy program of at least 3 hrs of therapy per day at least 5 days per week? Yes 6. The potential for patient to make measurable gains while on inpatient rehab is excellent 7. Anticipated functional outcomes upon discharge from inpatient rehab are modified independent  with PT, modified independent and supervision with OT, n/a with SLP. 8. Estimated rehab length of stay to reach the above functional goals is: 10-14 9. Does the patient have adequate social supports and living environment to accommodate these discharge functional goals? Yes 10. Anticipated D/C setting: Home 11. Anticipated post D/C treatments: HH therapy and Outpatient therapy 12. Overall Rehab/Functional Prognosis: excellent  RECOMMENDATIONS: This patient's condition is appropriate for continued rehabilitative care in the following setting: CIR Patient has agreed to participate in recommended program. Yes Note that insurance prior authorization may be required for reimbursement for recommended care.  Comment: Rehab Admissions Coordinator to follow up.  Thanks,  Ronald Oyster, MD, [place] [facility]    Charlton Amor., PA-C 10/26/2016    Revision History                        Routing History

## 2016-10-28 NOTE — Progress Notes (Signed)
Rehab admissions - I met with patient at the bedside today.  He is willing to admit to inpatient rehab for a few days.  I called and spoke with his sister in law, Rod Holler, and she is supportive.  Bed available and will admit to acute inpatient rehab today.  Patient has been cleared by attending MD.  Call me for questions.  #[phone]

## 2016-10-28 NOTE — PMR Pre-admission (Signed)
PMR Admission Coordinator Pre-Admission Assessment  Patient: Ronald Forbes is an 56 y.o., male MRN: [id number] DOB: 10-24-1960 Height: 6\' 3"  (190.5 cm) Weight: 111.6 kg (246 lb 0.5 oz)              Insurance Information HMO:     PPO:       PCP:       IPA:       80/20:       OTHER:   PRIMARY: Medicaid [place] access      Policy#:  [id number]      Subscriber:  [name] CM Name:        Phone#:       Fax#:   Pre-Cert#:        Employer: Disabled Benefits:  Phone #: [phone]     Name: Automated Eff. Date: Eligible 10/26/16 with coverage code [facility]     Deduct:        Out of Pocket Max:        Life Max:   CIR:        SNF:   Outpatient:       Co-Pay:   Home Health:        Co-Pay:   DME:       Co-Pay:   Providers:    Medicaid Application Date:        Case Manager:   Disability Application Date:        Case Worker:    Emergency Contact Information Contact Information    Name Relation Home Work Mobile   Sage Brother [phone]  [phone]   Toni Arthurs   [phone]     Current Medical History  Patient Admitting Diagnosis:  RLE weakness and pain due to PAD s/p RLE bypass graft. Has history of baseline right hemiparesis due to old CVA   History of Present Illness: A 56 y.o.right handed malewith history of hyperlipidemia, diabetes mellitus, tobacco and alcohol abuse, morbid obesity, CVA with Loop recorder and right side residual weaknessJune 2017 was discharged to [facility] nursing facility. Per chart review patient lives alone independent with a walker prior to admission. One level entry apartment. He has a sister and brother that check on him. Presented 10/24/2016 with progressive rest pain and numbness to the right lower extremity. Underwent aortogram right lower extremity angiogram that showed occlusion of the right superficial femoral artery as well as anterior tibial artery. Underwent right femoral to tibial peroneal trunk bypass with non-reversed  ipsilateral great saphenous vein 10/25/2016 per Dr. Fabienne Bruns. Hospital course pain management. Subcutaneous heparin for DVT prophylaxis. Physical and occupational therapy evaluations completed 10/26/2016 with recommendations of physical medicine rehabilitation consult.  Patient to be admitted for a comprehensive inpatient rehabilitation program.    Past Medical History  Past Medical History:  Diagnosis Date  . Alcohol use disorder (HCC)   . Cerebral vascular accident (HCC)   . 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 Diabetes mellitus type 2 in obese (HCC)   . HLD (hyperlipidemia)   . Morbid obesity (HCC)   . Stroke (HCC)   . Tobacco abuse     Family History  family history is not on file.  Prior Rehab/Hospitalizations: No previous rehab admissions.  Has the patient had major surgery during 100 days prior to admission? No  Current Medications   Current Facility-Administered Medications:  .  0.9 %  sodium chloride infusion, 500 mL, Intravenous, Once PRN, [employer], PA-C .  acetaminophen (TYLENOL) tablet 325-650 mg, 325-650 mg, Oral, Q4H PRN **OR** acetaminophen (TYLENOL) suppository 325-650 mg, 325-650 mg, Rectal, Q4H PRN, Samantha J Rhyne, PA-C .  alum & mag hydroxide-simeth (MAALOX/MYLANTA) 200-200-20 MG/5ML suspension 15-30 mL, 15-30 mL, Oral, Q2H PRN, Samantha J Rhyne, PA-C .  aspirin chewable tablet 81 mg, 81 mg, Oral, Daily, Maeola Harman, MD, 81 mg at 10/28/16 0941 .  atorvastatin (LIPITOR) tablet 40 mg, 40 mg, Oral, q1800, Maeola Harman, MD, 40 mg at 10/27/16 1827 .  bisacodyl (DULCOLAX) suppository 10 mg, 10 mg, Rectal, Daily PRN, Samantha J Rhyne, PA-C .  docusate sodium (COLACE) capsule 100 mg, 100 mg, Oral, Daily, Samantha J Rhyne, PA-C, 100 mg at 10/28/16 0941 .  guaiFENesin-dextromethorphan (ROBITUSSIN DM) 100-10 MG/5ML syrup 15 mL, 15 mL, Oral, Q4H PRN, Samantha J Rhyne, PA-C .   heparin injection 5,000 Units, 5,000 Units, Subcutaneous, Q8H, Samantha J Rhyne, PA-C, 5,000 Units at 10/28/16 0617 .  hydrALAZINE (APRESOLINE) injection 5 mg, 5 mg, Intravenous, Q20 Min PRN, [employer], PA-C .  HYDROcodone-acetaminophen (NORCO/VICODIN) 5-325 MG per tablet 1-2 tablet, 1-2 tablet, Oral, Q4H PRN, Sherren Kerns, MD, 2 tablet at 10/28/16 1105 .  insulin aspart (novoLOG) injection 0-15 Units, 0-15 Units, Subcutaneous, TID WC, Samantha J Rhyne, PA-C, 2 Units at 10/27/16 1827 .  labetalol (NORMODYNE,TRANDATE) injection 10 mg, 10 mg, Intravenous, Q10 min PRN, Samantha J Rhyne, PA-C .  magnesium sulfate IVPB 2 g 50 mL, 2 g, Intravenous, Daily PRN, Samantha J Rhyne, PA-C .  metoprolol (LOPRESSOR) injection 2-5 mg, 2-5 mg, Intravenous, Q2H PRN, Samantha J Rhyne, PA-C .  morphine 4 MG/ML injection 4 mg, 4 mg, Intravenous, Q2H PRN, Chuck Hint, MD, 4 mg at 10/26/16 0147 .  ondansetron (ZOFRAN) injection 4 mg, 4 mg, Intravenous, Q6H PRN, Samantha J Rhyne, PA-C .  pantoprazole (PROTONIX) EC tablet 40 mg, 40 mg, Oral, Daily, Samantha J Rhyne, PA-C, 40 mg at 10/28/16 0941 .  phenol (CHLORASEPTIC) mouth spray 1 spray, 1 spray, Mouth/Throat, PRN, Samantha J Rhyne, PA-C .  polyethylene glycol (MIRALAX / GLYCOLAX) packet 17 g, 17 g, Oral, Daily PRN, [place] Rhyne, PA-C  Patients Current Diet: Diet heart healthy/carb modified Room service appropriate? Yes; Fluid consistency: Thin  Precautions / Restrictions Precautions Precautions: Fall Precaution Comments: noted R great and second toes are bruised/blue and cool Restrictions Weight Bearing Restrictions: No   Has the patient had 2 or more falls or a fall with injury in the past year?No  Prior Activity Level Limited Community (1-2x/wk): Went out 1-2 X a week, was driving.  Home Assistive Devices / Equipment Home Assistive Devices/Equipment: Cane (specify quad or straight) Home Equipment: Walker - 2 wheels  Prior Device  Use: Indicate devices/aids used by the patient prior to current illness, exacerbation or injury? Cane  Prior Functional Level Prior Function Level of Independence: Independent Comments: reports he does not work, drives  Self Care: Did the patient need help bathing, dressing, using the toilet or eating?  Independent  Indoor Mobility: Did the patient need assistance with walking from room to room (with or without device)? Independent  Stairs: Did the patient need assistance with internal or external stairs (with or without device)? Independent  Functional Cognition: Did the patient need help planning regular tasks such as shopping or remembering to take medications? Independent  Current Functional Level Cognition  Overall Cognitive Status: No family/caregiver present to determine baseline cognitive functioning Orientation Level: Oriented X4    Extremity Assessment (includes Sensation/Coordination)  Upper Extremity Assessment: RUE deficits/detail RUE Deficits / Details: limited RUE use due to residual weakness and limited range from prior CVA RUE Sensation: decreased light touch RUE Coordination: decreased fine motor, decreased gross motor (no functional use)  Lower Extremity Assessment: Defer to PT evaluation RLE Deficits / Details: residual Right LE deficits from prior CVA, additionally, unable to fully assess at this time dur to pain RLE: Unable to fully assess due to pain RLE Sensation: decreased light touch, decreased proprioception RLE Coordination: decreased fine motor, decreased gross motor    ADLs  Overall ADL's : Needs assistance/impaired Eating/Feeding: Set up, Sitting Grooming: Wash/dry hands, Wash/dry face, Oral care, Sitting, Set up Upper Body Bathing: Set up, Sitting Lower Body Bathing: Total assistance, Sit to/from stand Upper Body Dressing : Set up, Sitting Lower Body Dressing: Total assistance, Sit to/from stand Toilet Transfer: Minimal assistance, Stand-pivot,  +2 for safety/equipment Toilet Transfer Details (indicate cue type and reason): simulated to chair Toileting- Clothing Manipulation and Hygiene: Total assistance, Sit to/from stand Functional mobility during ADLs:  (unable to ambulate) General ADL Comments: Pt with decreased tolerance of weight on R LE which is also his weak side.    Mobility  Overal bed mobility: Needs Assistance Bed Mobility: Supine to Sit Supine to sit: Min assist General bed mobility comments: increased time, minA for R LE management off bed and back into bed    Transfers  Overall transfer level: Needs assistance Equipment used: 1 person hand held assist Transfers: Sit to/from Stand Sit to Stand: Min assist Stand pivot transfers: Mod assist, +2 physical assistance General transfer comment: min guard for safety, pt impulsively stood up grabing onto RW    Ambulation / Gait / Stairs / Wheelchair Mobility  Ambulation/Gait Ambulation/Gait assistance: Architect (Feet): 25 Feet Assistive device: Rolling walker (2 wheeled) Gait Pattern/deviations: Antalgic General Gait Details: step to pattern, increased R LE pain, dec R LE WBing tolerance Gait velocity: slow Gait velocity interpretation: Below normal speed for age/gender    Posture / Balance Balance Overall balance assessment: Needs assistance Sitting-balance support: Feet supported, No upper extremity supported Sitting balance-Leahy Scale: Good Standing balance support: Bilateral upper extremity supported Standing balance-Leahy Scale: Poor Standing balance comment: reliance on UE support    Special needs/care consideration BiPAP/CPAP No CPM No Continuous Drip IV No Dialysis No       Life Vest No Oxygen No Special Bed No Trach Size No Wound Vac (area) No     Skin Has healing right thigh/lower leg surgical incision                             Bowel mgmt: Last BM 10/27/16 Bladder mgmt: Voiding in bathroom with assistance Diabetic mgmt  Yes, or oral medication at home    Previous Home Environment Living Arrangements: Alone Available Help at Discharge: Family, Available PRN/intermittently (sister in law) Type of Home: Apartment Home Layout: One level Home Access: Level entry Bathroom Shower/Tub: Engineer, manufacturing systems: Standard Home Care Services: No  Discharge Living Setting Plans for Discharge Living Setting: Alone, Apartment (Plans to go home alone.) Type of Home at Discharge: Apartment Discharge Home Layout: One level Discharge Home Access: Level entry Does the patient have any problems obtaining your medications?: No  Social/Family/Support Systems Patient Roles: Other (Comment) (Has a brother and sister-in-law.) Contact Information: Onnie Boer - brother and Gaylord Shih - sister in law Anticipated Caregiver: self with stop by help from brother/sister  in law Anticipated Caregiver's Contact Information: See emergency contacts Caregiver Availability: Intermittent Discharge Plan Discussed with Primary Caregiver: Yes Is Caregiver In Agreement with Plan?: Yes Does Caregiver/Family have Issues with Lodging/Transportation while Pt is in Rehab?: No  Goals/Additional Needs Patient/Family Goal for Rehab: PT mod I, OT mod I and supervision goals Expected length of stay: 10-14 days (Anticipate it could be shorter LOS.) Cultural Considerations: None Dietary Needs: Heart healthy, carb mod, thin liquids Equipment Needs: TBD Additional Information: I spoke with patient's sister in law.  Brother and sister in law can check in on patient and get groceries, run errands for patient after discharge. Pt/Family Agrees to Admission and willing to participate: Yes Program Orientation Provided & Reviewed with Pt/Caregiver Including Roles  & Responsibilities: Yes  Decrease burden of Care through IP rehab admission: N/A  Possible need for SNF placement upon discharge: Not anticipated  Patient Condition: This patient's  medical and functional status has changed since the consult dated: 10/26/16 in which the Rehabilitation Physician determined and documented that the patient's condition is appropriate for intensive rehabilitative care in an inpatient rehabilitation facility. See "History of Present Illness" (above) for medical update. Functional changes are:  Currently requiring min assist to ambulate 25 feet RW. Patient's medical and functional status update has been discussed with the Rehabilitation physician and patient remains appropriate for inpatient rehabilitation. Will admit to inpatient rehab today.  Preadmission Screen Completed By:  Trish MageLogue, Drequan Ironside M, 10/28/2016 11:37 AM ______________________________________________________________________   Discussed status with Dr. Wynn BankerKirsteins on 10/28/16 at 1149 and received telephone approval for admission today.  Admission Coordinator:  Trish MageLogue, Tabb Croghan M, time 1149/Date 10/28/16

## 2016-10-28 NOTE — Discharge Summary (Signed)
[facility] Discharge Summary   Patient ID:  Ronald Forbes MRN: [id number] DOB/AGE: [birth year] 56 y.o.  Admit date: 10/20/2016 Discharge date: 10/28/2016 Date of Surgery: 10/20/2016 - 10/25/2016 Surgeon: Moishe SpiceSurgeon(s): Sherren Kernsharles E Fields, MD  Admission Diagnosis: PAD (peripheral artery disease) [facility](HCC) [I73.9]  Discharge Diagnoses:  PAD (peripheral artery disease) (HCC) [I73.9]  Secondary Diagnoses: Past Medical History:  Diagnosis Date  . Alcohol use disorder (HCC)   . Cerebral vascular accident (HCC)   . 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 Diabetes mellitus type 2 in obese (HCC)   . HLD (hyperlipidemia)   . Morbid obesity (HCC)   . Stroke (HCC)   . Tobacco abuse     Procedure(s): BYPASS GRAFT FEMORAL-TIBIAL ARTERY USING NON REVERSED SAPPHENOUS VEIN INTRA OPERATIVE ARTERIOGRAM  Discharged Condition: good  HPI: 56 y/o male with acute onset of right lower leg pain, numbness, and loss of motor since Monday that has gotten progressively worse.  He was able to ambulate with a cane prior to this.  He has a history of CVA with right UE/LE weakness 01/2016.  He was discharged to a SNF on a statin and Asprin.  An echo was performed during the admission which demonstrated no cardiac sorce.  He was discharged from the SNF in OCT. 2017.  He has not taken any medications since then.  Other medical history includes DM untreated since Oct.  He is a current smoker and has smoked for 20+ years and alcohol abuse.  He denise CAD and A fib history.  ABI's ordered shows no flow in the right LE Vascular Ultrasound Lower Extremity Arterial Duplexhas been completed. Preliminary findings: Triphasic flow seen in the right iliac, monophasic flow in the common femoral artery and profunda. No flow seen in the femoral artery, popliteal or calf arteries. Anterior tibial artery and dorsalis pedis not well interrogated due to patients pain  tolerance and refusal to finish exam.  He underwent angiogram with LE runoff by Dr. Randie Heinzain: Aortoiliac segments are patent. There is a flush occlusion of the right superficial femoral artery which is reconstituted distally via tibioperoneal trunk. There is runoff to the level of the ankle via the peroneal and PT artery on the right with dominant to the foot being the PT artery. Anterior tibial artery is occluded at its origin.  Hospital Course:  Ronald Forbes is a 56 y.o. male is S/P Right Procedure(s): BYPASS GRAFT FEMORAL-TIBIAL ARTERY USING NON REVERSED SAPPHENOUS VEIN INTRA OPERATIVE ARTERIOGRAM  Post op Biphasic PT/Peroneal/DP with post op ABI 0.58.   His pain is controlled with PO meds when he takes them, he is very depressed about his current situation.     Some periincisional erythema mid thigh most likely bruising. Bypass patent. Right first toe dusky will wait to declare a few weeks Work on mobility today.  Discharge to CIR today.  Significant Diagnostic Studies: CBC Lab Results  Component Value Date   WBC 8.1 10/26/2016   HGB 11.4 (L) 10/26/2016   HCT 33.5 (L) 10/26/2016   MCV 89.8 10/26/2016   PLT 138 (L) 10/26/2016    BMET    Component Value Date/Time   NA 139 10/26/2016 0211   NA 136 (A) 02/04/2016   K 3.6 10/26/2016 0211   CL 106 10/26/2016 0211   CO2 25 10/26/2016 0211   GLUCOSE 107 (H) 10/26/2016 0211   BUN 5 (L) 10/26/2016 0211   BUN 15 02/04/2016   CREATININE 0.80 10/26/2016  0211   CALCIUM 8.3 (L) 10/26/2016 0211   GFRNONAA >60 10/26/2016 0211   GFRAA >60 10/26/2016 0211   COAG Lab Results  Component Value Date   INR 1.04 10/25/2016   INR 1.01 02/04/2016     Disposition:  Discharge to :Rehab Discharge Instructions    Activity as tolerated - No restrictions    Complete by:  As directed    Call MD for:  redness, tenderness, or signs of infection (pain, swelling, bleeding, redness, odor or green/yellow discharge around incision site)    Complete  by:  As directed    Call MD for:  severe or increased pain, loss or decreased feeling  in affected limb(s)    Complete by:  As directed    Call MD for:  temperature >100.5    Complete by:  As directed    Discharge instructions    Complete by:  As directed    You may shower daily, dry incisional areas well.   Resume previous diet    Complete by:  As directed      Allergies as of 10/28/2016      Reactions   No Known Allergies       Medication List    TAKE these medications   aspirin 81 MG chewable tablet Chew 1 tablet (81 mg total) by mouth daily.   atorvastatin 40 MG tablet Commonly known as:  LIPITOR Take 1 tablet (40 mg total) by mouth daily at 6 PM.   HYDROcodone-acetaminophen 5-325 MG tablet Commonly known as:  NORCO/VICODIN Take 1-2 tablets by mouth every 4 (four) hours as needed for moderate pain.      Verbal and written Discharge instructions given to the patient. Wound care per Discharge AVS Follow-up Information    Fabienne Bruns, MD In 2 weeks.   Specialties:  Vascular Surgery, Cardiology Why:  Office will call you to arrange your appt (sent) Contact information: [address] [place] [address] [phone]           Signed: Clinton Gallant [facility] 10/28/2016, 10:42 AM - For VQI Registry use --- Instructions: Press F2 to tab through selections.  Delete question if not applicable.   Post-op:  Wound infection: No  Graft infection: No  Transfusion: No  If yes, 0 units given New Arrhythmia: No Ipsilateral amputation: [x ] no, [ ]  Minor, [ ]  BKA, [ ]  AKA Discharge patency: [x ] Primary, [ ]  Primary assisted, [ ]  Secondary, [ ]  Occluded Patency judged by: [x ] Dopper only, [ ]  Palpable graft pulse, [ ]  Palpable distal pulse, [ ]  ABI inc. > 0.15, [ ]  Duplex Discharge ABI: R 0.58, L 1.06 D/C Ambulatory Status: Ambulatory with Assistance  Complications: MI: [ x] No, [ ]  Troponin only, [ ]  EKG or Clinical CHF: No Resp failure: [x ] none, [ ]   Pneumonia, [ ]  Ventilator Chg in renal function: [x ] none, [ ]  Inc. Cr > 0.5, [ ]  Temp. Dialysis, [ ]  Permanent dialysis Stroke: [x ] None, [ ]  Minor, [ ]  Major Return to OR: No  Reason for return to OR: [ ]  Bleeding, [ ]  Infection, [ ]  Thrombosis, [ ]  Revision  Discharge medications: Statin use:  Yes ASA use:  Yes Plavix use:  No  for medical reason   Beta blocker use: No  for medical reason   Coumadin use: No  for medical reason

## 2016-10-28 NOTE — Progress Notes (Signed)
Physical Therapy Treatment Patient Details Name: Ronald HobbyKevin Forbes MRN: [id number] DOB: 1960-08-18 Today's Date: 10/28/2016    History of Present Illness 56 year old male history of a stroke with residual right-sided weakness who presents now s/p BYPASS GRAFT FEMORAL-TIBIAL ARTERY USING NON REVERSED SAPPHENOUS VEIN (Right)    PT Comments    Pt able to increase distance and Wb through R LE. Pt with flat affect. Plan is CIR- probably today.   Follow Up Recommendations  CIR     Equipment Recommendations  Rolling walker with 5" wheels    Recommendations for Other Services       Precautions / Restrictions Precautions Precautions: Fall Restrictions Weight Bearing Restrictions: No    Mobility  Bed Mobility               General bed mobility comments: sitting up in recliner upon arrival  Transfers Overall transfer level: Needs assistance Equipment used: Rolling walker (2 wheeled) Transfers: Sit to/from Stand Sit to Stand: Min assist            Ambulation/Gait Ambulation/Gait assistance: Min guard Ambulation Distance (Feet): 65 Feet Assistive device: Rolling walker (2 wheeled) Gait Pattern/deviations: Antalgic Gait velocity: decreased Gait velocity interpretation: Below normal speed for age/gender General Gait Details: step to pattern with increased weight throught R LE as gait progressed   Stairs            Wheelchair Mobility    Modified Rankin (Stroke Patients Only)       Balance     Sitting balance-Leahy Scale: Good       Standing balance-Leahy Scale: Poor Standing balance comment: reliance on UE support                    Cognition Arousal/Alertness: Awake/alert Behavior During Therapy: Flat affect Overall Cognitive Status: No family/caregiver present to determine baseline cognitive functioning                      Exercises General Exercises - Lower Extremity Long Arc Quad: AROM;Right;10 reps Heel Slides:  AROM;Right;10 reps;Seated (with washcloth under heel)    General Comments        Pertinent Vitals/Pain Pain Assessment: 0-10 Pain Score: 8  Pain Location: RLE and foot Pain Descriptors / Indicators: Grimacing Pain Intervention(s): Patient requesting pain meds-RN notified;Monitored during session;Limited activity within patient's tolerance    Home Living                      Prior Function            PT Goals (current goals can now be found in the care plan section) Progress towards PT goals: Progressing toward goals    Frequency    Min 3X/week      PT Plan Current plan remains appropriate    Co-evaluation             End of Session Equipment Utilized During Treatment: Gait belt Activity Tolerance: Patient tolerated treatment well Patient left: in chair;with call bell/phone within reach Nurse Communication: Mobility status PT Visit Diagnosis: Difficulty in walking, not elsewhere classified (R26.2);Pain Pain - Right/Left: Right Pain - part of body: Leg     Time: 1130-1145 PT Time Calculation (min) (ACUTE ONLY): 15 min  Charges:  $Gait Training: 8-22 mins                    G Codes:       Chantella Creech LUBECK 10/28/2016, 12:19 PM

## 2016-10-28 NOTE — Progress Notes (Deleted)
Rehab admissions - I met with patient at the bedside today.  He is willing to admit to inpatient rehab for a few days.  I called and spoke with his sister in law, Rod Holler, and she is supportive.  Bed available and will admit to acute inpatient rehab today.  Call me for questions.  #[phone]

## 2016-10-28 NOTE — Progress Notes (Signed)
Pt arrived to room 4W24. Oriented to room and rehab process; VSS.  C/O 8/10 RLE pain. 2 Norco given, effective.

## 2016-10-28 NOTE — H&P (Signed)
Physical Medicine and Rehabilitation Admission H&P    No chief complaint on file. : HPI: Ronald Forbes is a 56 y.o. right handed male with history of hyperlipidemia, diabetes mellitus, tobacco and alcohol abuse, morbid obesity, CVA with Loop recorder and right side residual weakness June 2017 was discharged to [facility] skilled nursing facility. Per chart review patient lives alone independent with a walker prior to admission. One level entry apartment. He has a sister and brother that check on him. Presented 10/24/2016 with progressive rest pain and numbness to the right lower extremity. Underwent aortogram right lower extremity angiogram that showed occlusion of the right superficial femoral artery as well as anterior tibial artery. Underwent right femoral to tibial peroneal trunk bypass with non-reversed ipsilateral great saphenous vein 10/25/2016 per Dr. Fabienne Bruns. Hospital course pain management. Subcutaneous heparin for DVT prophylaxis. Physical and occupational therapy evaluations completed 10/26/2016 with recommendations of physical medicine rehabilitation consult.Patient was admitted for a comprehensive rehabilitation program  Patient states that he has had some speech problems since his stroke. He has continued right foot pain, although this is getting better with time.  Review of Systems  Constitutional: Negative for chills and fever.  HENT: Negative for hearing loss.   Eyes: Negative for blurred vision and double vision.  Respiratory: Positive for shortness of breath. Negative for cough.   Cardiovascular: Positive for leg swelling. Negative for chest pain and palpitations.  Gastrointestinal: Positive for constipation. Negative for nausea and vomiting.  Genitourinary: Positive for urgency. Negative for dysuria and hematuria.  Musculoskeletal: Positive for joint pain and myalgias.  Skin: Negative for rash.  Neurological: Positive for tingling. Negative for seizures.  All  other systems reviewed and are negative.   Constitutional. Negative for chills and fever. Right side residual weakness from prior CVA Past Medical History:  Diagnosis Date  . Alcohol use disorder (HCC)   . Cerebral vascular accident (HCC)   . 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 Diabetes mellitus type 2 in obese (HCC)   . HLD (hyperlipidemia)   . Morbid obesity (HCC)   . Stroke (HCC)   . Tobacco abuse    Past Surgical History:  Procedure Laterality Date  . ABDOMINAL AORTOGRAM W/LOWER EXTREMITY Right 10/20/2016   Procedure: Abdominal Aortogram w/Lower Extremity;  Surgeon: Maeola Harman, MD;  Location: [facility] INVASIVE CV LAB;  Service: Cardiovascular;  Laterality: Right;  lower leg  . EP IMPLANTABLE DEVICE N/A 02/09/2016   Procedure: Loop Recorder Insertion;  Surgeon: Duke Salvia, MD;  Location: [facility] INVASIVE CV LAB;  Service: Cardiovascular;  Laterality: N/A;  . FEMORAL-TIBIAL BYPASS GRAFT Right 10/25/2016   Procedure: BYPASS GRAFT FEMORAL-TIBIAL ARTERY USING NON REVERSED SAPPHENOUS VEIN;  Surgeon: Sherren Kerns, MD;  Location: [facility] OR;  Service: Vascular;  Laterality: Right;  . INTRAOPERATIVE ARTERIOGRAM Right 10/25/2016   Procedure: INTRA OPERATIVE ARTERIOGRAM;  Surgeon: Sherren Kerns, MD;  Location[facility] OR;  Service: Vascular;  Laterality: Right;  . TEE WITHOUT CARDIOVERSION N/A 02/06/2016   Procedure: TRANSESOPHAGEAL ECHOCARDIOGRAM (TEE);  Surgeon: Wendall Stade, MD;  Location: [facility] ENDOSCOPY;  Service: Cardiovascular;  Laterality: N/A;   No family history on file. Social History:  reports that he has been smoking Cigarettes.  He has a 40.00 pack-year smoking history. He has never used smokeless tobacco. He reports that he drinks alcohol. He reports that he uses drugs, including Marijuana. Allergies:  Allergies  Allergen Reactions  . No Known Allergies  Medications Prior to Admission  Medication Sig Dispense Refill    . aspirin 81 MG chewable tablet Chew 1 tablet (81 mg total) by mouth daily. (Patient not taking: Reported on 10/20/2016) 30 tablet 0  . atorvastatin (LIPITOR) 40 MG tablet Take 1 tablet (40 mg total) by mouth daily at 6 PM. (Patient not taking: Reported on 10/20/2016) 30 tablet 0  . HYDROcodone-acetaminophen (NORCO/VICODIN) 5-325 MG tablet Take 1-2 tablets by mouth every 4 (four) hours as needed for moderate pain. 30 tablet 0    Home:     Functional History:    Functional Status:  Mobility:          ADL:    Cognition:      Physical Exam: Blood pressure (!) 141/85, pulse 64, temperature 98.3 F (36.8 C), temperature source Oral, resp. rate 18, height 6\' 3"  (1.905 m), weight 114.2 kg (251 lb 12.8 oz), SpO2 98 %. Physical Exam  HENT:  Head: Normocephalic.  Eyes: EOM are normal. Left eye exhibits no discharge.  Neck: Normal range of motion. Neck supple. No thyromegaly present.  Cardiovascular: Normal rate, regular rhythm and normal heart sounds.   Respiratory: Effort normal and breath sounds normal. No respiratory distress.  GI: Soft. Bowel sounds are normal. He exhibits no distension.   Musculoskeletal.+1RUE/RLE edema Skin. Right first, second,  and fifth toes dusky and cool,  MTPs and proximal   warm. 2 plus foot and ankle edema, diffuse postoperative swelling in right thigh, right leg, no drainage from medial thigh and medial leg. Surgical incisions  Neurological: He is alert.  Patient provides his age, name and date of birth. Follows simple commands. RUE: 3/5 prox to distal. RLE: 2/5 with pain inhibition due to incision. Senses pain in all 4. Reasonable insight and awareness Motor strength is 4/5 right deltoid, biceps, triceps, grip 5/5 left deltoid, biceps, triceps, grip 5/5 left hip flexor, knee extensor, ankle dorsiflexor. 3 minus, right hip flexor, knee extensor, ankle dorsiflexor Difficulty with sensory exam due to expressive language Results for orders placed or  performed during the hospital encounter of 10/20/16 (from the past 48 hour(s))  Glucose, capillary     Status: Abnormal   Collection Time: 10/26/16  4:59 PM  Result Value Ref Range   Glucose-Capillary 122 (H) 65 - 99 mg/dL   Comment 1 Notify RN    Comment 2 Document in Chart   Glucose, capillary     Status: Abnormal   Collection Time: 10/26/16  9:18 PM  Result Value Ref Range   Glucose-Capillary 127 (H) 65 - 99 mg/dL   Comment 1 Notify RN    Comment 2 Document in Chart   Glucose, capillary     Status: Abnormal   Collection Time: 10/27/16  6:04 AM  Result Value Ref Range   Glucose-Capillary 191 (H) 65 - 99 mg/dL   Comment 1 Notify RN    Comment 2 Document in Chart   Glucose, capillary     Status: Abnormal   Collection Time: 10/27/16 11:21 AM  Result Value Ref Range   Glucose-Capillary 119 (H) 65 - 99 mg/dL   Comment 1 Notify RN    Comment 2 Document in Chart   Glucose, capillary     Status: Abnormal   Collection Time: 10/27/16  4:07 PM  Result Value Ref Range   Glucose-Capillary 122 (H) 65 - 99 mg/dL   Comment 1 Notify RN    Comment 2 Document in Chart   Glucose, capillary     Status:  Abnormal   Collection Time: 10/27/16  9:43 PM  Result Value Ref Range   Glucose-Capillary 108 (H) 65 - 99 mg/dL  Glucose, capillary     Status: Abnormal   Collection Time: 10/28/16  6:38 AM  Result Value Ref Range   Glucose-Capillary 107 (H) 65 - 99 mg/dL  Glucose, capillary     Status: Abnormal   Collection Time: 10/28/16 11:38 AM  Result Value Ref Range   Glucose-Capillary 130 (H) 65 - 99 mg/dL   Comment 1 Notify RN    Comment 2 Document in Chart    No results found.     Medical Problem List and Plan: 1.  Decreased functional mobility with right lower extremity weakness and pain  secondary to PAD s/p RLE bypass graft 10/25/2016. History of baseline right hemiparesis due to old CVA June 2017 S/P loop recorder 2.  DVT Prophylaxis/Anticoagulation: Subcutaneous heparin. Monitor platelet  counts and any signs of bleeding 3. Pain Management: Hydrocodone as needed 4. Mood: Provide emotional support 5. Neuropsych: This patient is capable of making decisions on his own behalf. 6. Skin/Wound Care: Routine skin checks 7. Fluids/Electrolytes/Nutrition: Routine I&O with follow-up chemistries 8. Diabetes mellitus with peripheral neuropathy. Latest hemoglobin A1c 6.6. Presently on SSI. Patient diet controlled prior to admission 9. Hyperlipidemia. Lipitor 10. History of alcohol tobacco abuse. Provide counseling 11. Morbid obesity. Dietary follow-up 12. Constipation. Laxative assist    Post Admission Physician Evaluation: 1. Functional deficits secondary  to Right superficial femoral artery occlusion, status post fem-tibial bypass postop day #2. History of CVA with residual right hemiparesis and aphasia 2. Patient is admitted to receive collaborative, interdisciplinary care between the physiatrist, rehab nursing staff, and therapy team. 3. Patient's level of medical complexity and substantial therapy needs in context of that medical necessity cannot be provided at a lesser intensity of care such as a SNF. 4. Patient has experienced substantial functional loss from his/her baseline which was documented above under the "Functional History" and "Functional Status" headings.  Judging by the patient's diagnosis, physical exam, and functional history, the patient has potential for functional progress which will result in measurable gains while on inpatient rehab.  These gains will be of substantial and practical use upon discharge  in facilitating mobility and self-care at the household level. 5. Physiatrist will provide 24 hour management of medical needs as well as oversight of the therapy plan/treatment and provide guidance as appropriate regarding the interaction of the two. 6. The Preadmission Screening has been reviewed and patient status is unchanged unless otherwise stated above. 7. 24  hour rehab nursing will assist with bladder management, bowel management, safety, skin/wound care, disease management, medication administration, pain management and patient education  and help integrate therapy concepts, techniques,education, etc. 8. PT will assess and treat for/with: pre gait, gait training, endurance , safety, equipment, neuromuscular re education.   Goals are: Mod I. 9. OT will assess and treat for/with: ADLs, Cognitive perceptual skills, Neuromuscular re education, safety, endurance, equipment.   Goals are: Mod I. Therapy may proceed with showering this patient. 10. SLP will assess and treat for/with: Communication, cognition.  Goals are: Improve functional communication. 11. Case Management and Social Worker will assess and treat for psychological issues and discharge planning. 12. Team conference will be held weekly to assess progress toward goals and to determine barriers to discharge. 13. Patient will receive at least 3 hours of therapy per day at least 5 days per week. 14. ELOS: 7 days  15. Prognosis:  excellent     Lyndon Code PA-C Erick Colace, MD 10/28/2016

## 2016-10-28 NOTE — Progress Notes (Signed)
[facility]  Subjective  - still with some foot pain but about the same as yesterday   Objective (!) 156/87 67 98 F (36.7 C) (Oral) 18 96%  Intake/Output Summary (Last 24 hours) at 10/28/16 0743 Last data filed at 10/28/16 0620  Gross per 24 hour  Intake              960 ml  Output              750 ml  Net              210 ml   Right foot warm.  Toes dusky All incisions healing, no drainage   Assessment/Planning: s/p fem tpt bypass will take 4-6 weeks to be able to see if toes will survive Pain control overall improving Ok for rehab when bed available  Fabienne BrunsFields, Charles 10/28/2016 7:43 AM --  Laboratory Lab Results:  Recent Labs  10/25/16 1600 10/26/16 0211  WBC 10.3 8.1  HGB 12.4* 11.4*  HCT 37.0* 33.5*  PLT 160 138*   BMET  Recent Labs  10/25/16 1600 10/26/16 0211  NA  --  139  K  --  3.6  CL  --  106  CO2  --  25  GLUCOSE  --  107*  BUN  --  5*  CREATININE 0.89 0.80  CALCIUM  --  8.3*    COAG Lab Results  Component Value Date   INR 1.04 10/25/2016   INR 1.01 02/04/2016   No results found for: PTT

## 2016-10-28 NOTE — Progress Notes (Signed)
Occupational Therapy Treatment Patient Details Name: Ronald Forbes MRN: [id number] DOB: Feb 19, 1961 Today's Date: 10/28/2016    History of present illness 56 year old male history of a stroke with residual right-sided weakness who presents now s/p BYPASS GRAFT FEMORAL-TIBIAL ARTERY USING NON REVERSED SAPPHENOUS VEIN (Right)   OT comments  Pt currently required min assist for toilet transfer ambulating to the bathroom and LB dressing. D/c plan remains appropriate. Will continue to follow acutely.   Follow Up Recommendations  CIR    Equipment Recommendations  3 in 1 bedside commode;Tub/shower bench;Wheelchair (measurements OT);Wheelchair cushion (measurements OT)    Recommendations for Other Services      Precautions / Restrictions Precautions Precautions: Fall Restrictions Weight Bearing Restrictions: No       Mobility Bed Mobility               General bed mobility comments: Pt OOB in chair upon arrival  Transfers Overall transfer level: Needs assistance Equipment used: Rolling walker (2 wheeled) Transfers: Sit to/from Stand Sit to Stand: Min assist         General transfer comment: Min assist to boost up from chair x1, toilet x1.     Balance Overall balance assessment: Needs assistance Sitting-balance support: Feet supported;No upper extremity supported Sitting balance-Leahy Scale: Good     Standing balance support: Bilateral upper extremity supported Standing balance-Leahy Scale: Poor Standing balance comment: reliance on UE support                   ADL Overall ADL's : Needs assistance/impaired                     Lower Body Dressing: Minimal assistance;Sit to/from stand   Toilet Transfer: Minimal assistance;Ambulation;Regular Toilet;Grab bars;RW           Functional mobility during ADLs: Minimal assistance;Rolling walker        Vision                     Perception     Praxis      Cognition   Behavior  During Therapy: Flat affect Overall Cognitive Status: No family/caregiver present to determine baseline cognitive functioning                         Exercises    Shoulder Instructions       General Comments      Pertinent Vitals/ Pain       Pain Assessment: 0-10 Pain Score: 8  Pain Location: R foot Pain Descriptors / Indicators: Grimacing;Aching Pain Intervention(s): Monitored during session;Limited activity within patient's tolerance  Home Living                                          Prior Functioning/Environment              Frequency  Min 2X/week        Progress Toward Goals  OT Goals(current goals can now be found in the care plan section)  Progress towards OT goals: Progressing toward goals  Acute Rehab OT Goals Patient Stated Goal: rehab today OT Goal Formulation: With patient  Plan Discharge plan remains appropriate    Co-evaluation                 End of Session Equipment Utilized During Treatment: Rolling walker  OT Visit  Diagnosis: Unsteadiness on feet (R26.81);Hemiplegia and hemiparesis;Pain;Other symptoms and signs involving cognitive function Hemiplegia - Right/Left: Right Hemiplegia - dominant/non-dominant: Dominant Hemiplegia - caused by: Cerebral infarction Pain - Right/Left: Right Pain - part of body: Leg   Activity Tolerance Patient tolerated treatment well   Patient Left in chair;with call bell/phone within reach   Nurse Communication          Time: 1610-9604 OT Time Calculation (min): 16 min  Charges: OT General Charges $OT Visit: 1 Procedure OT Treatments $Self Care/Home Management : 8-22 mins  January Bergthold A. Brett Albino, M.S., OTR/L Pager: [phone]   Gaye Alken 10/28/2016, 12:35 PM

## 2016-10-28 NOTE — Progress Notes (Signed)
Ronald MageEugenia M Renell Coaxum, RN Rehab Admission Coordinator Signed Physical Medicine and Rehabilitation  PMR Pre-admission Date of Service: 10/28/2016 11:37 AM  Related encounter: ED to Hosp-Admission (Discharged) from [facility]       [] Hide copied text PMR Admission Coordinator Pre-Admission Assessment  Patient: Ronald Forbes is an 56 y.o., male MRN: [id number] DOB: 29-Jun-1961 Height: 6\' 3"  (190.5 cm) Weight: 111.6 kg (246 lb 0.5 oz)                                                                                                                                                  Insurance Information HMO:     PPO:       PCP:       IPA:       80/20:       OTHER:   PRIMARY: Medicaid [place] access      Policy#:  [id number]      Subscriber:  Ronald Forbes CM Name:        Phone#:       Fax#:   Pre-Cert#:        Employer: Disabled Benefits:  Phone #: [phone]     Name: Automated Eff. Date: Eligible 10/26/16 [facility]     Deduct:        Out of Pocket Max:        Life Max:   CIR:        SNF:   Outpatient:       Co-Pay:   Home Health:        Co-Pay:   DME:       Co-Pay:   Providers:    Medicaid Application Date:        Case Manager:   Disability Application Date:        Case Worker:    Emergency Contact Information        Contact Information    Name Relation Home Work Mobile   PalmyraVandunk,Guy Brother [phone]  [phone]   Toni ArthursVandunk,Ruth Other   [phone]     Current Medical History  Patient Admitting Diagnosis: RLE weakness and pain due to PAD s/p RLE bypass graft. Has history of baseline right hemiparesis due to old CVA   History of Present Illness: A 56 y.o.right handed malewith history of hyperlipidemia, diabetes mellitus, tobacco and alcohol abuse, morbid obesity, CVA with Loop recorder and right side residual weaknessJune 2017 [facility]. Per chart review patient lives  alone independent with a walker prior to admission. One level entry apartment. He has a sister and brother that check on him. Presented 10/24/2016 with progressive rest pain and numbness to the right lower extremity. Underwent aortogram right lower extremity angiogram that showed occlusion of the right superficial femoral artery as well as anterior tibial artery. Underwent  right femoral to tibial peroneal trunk bypass with non-reversed ipsilateral great saphenous vein 10/25/2016 per Dr. Fabienne Bruns. Hospital course pain management. Subcutaneous heparin for DVT prophylaxis. Physical and occupational therapy evaluations completed 10/26/2016 with recommendations of physical medicine rehabilitation consult.  Patient to be admitted for a comprehensive inpatient rehabilitation program.    Past Medical History      Past Medical History:  Diagnosis Date  . Alcohol use disorder (HCC)   . Cerebral vascular accident (HCC)   . 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 Diabetes mellitus type 2 in obese (HCC)   . HLD (hyperlipidemia)   . Morbid obesity (HCC)   . Stroke (HCC)   . Tobacco abuse     Family History  family history is not on file.  Prior Rehab/Hospitalizations: No previous rehab admissions.  Has the patient had major surgery during 100 days prior to admission? No  Current Medications   Current Facility-Administered Medications:  .  0.9 %  sodium chloride infusion, 500 mL, Intravenous, Once PRN, Samantha J Rhyne, PA-C .  acetaminophen (TYLENOL) tablet 325-650 mg, 325-650 mg, Oral, Q4H PRN **OR** acetaminophen (TYLENOL) suppository 325-650 mg, 325-650 mg, Rectal, Q4H PRN, Samantha J Rhyne, PA-C .  alum & mag hydroxide-simeth (MAALOX/MYLANTA) 200-200-20 MG/5ML suspension 15-30 mL, 15-30 mL, Oral, Q2H PRN, Samantha J Rhyne, PA-C .  aspirin chewable tablet 81 mg, 81 mg, Oral, Daily, Maeola Harman, MD, 81 mg at  10/28/16 0941 .  atorvastatin (LIPITOR) tablet 40 mg, 40 mg, Oral, q1800, Maeola Harman, MD, 40 mg at 10/27/16 1827 .  bisacodyl (DULCOLAX) suppository 10 mg, 10 mg, Rectal, Daily PRN, Samantha J Rhyne, PA-C .  docusate sodium (COLACE) capsule 100 mg, 100 mg, Oral, Daily, Samantha J Rhyne, PA-C, 100 mg at 10/28/16 0941 .  guaiFENesin-dextromethorphan (ROBITUSSIN DM) 100-10 MG/5ML syrup 15 mL, 15 mL, Oral, Q4H PRN, Samantha J Rhyne, PA-C .  heparin injection 5,000 Units, 5,000 Units, Subcutaneous, Q8H, Samantha J Rhyne, PA-C, 5,000 Units at 10/28/16 0617 .  hydrALAZINE (APRESOLINE) injection 5 mg, 5 mg, Intravenous, Q20 Min PRN, [employer], PA-C .  HYDROcodone-acetaminophen (NORCO/VICODIN) 5-325 MG per tablet 1-2 tablet, 1-2 tablet, Oral, Q4H PRN, Sherren Kerns, MD, 2 tablet at 10/28/16 1105 .  insulin aspart (novoLOG) injection 0-15 Units, 0-15 Units, Subcutaneous, TID WC, Samantha J Rhyne, PA-C, 2 Units at 10/27/16 1827 .  labetalol (NORMODYNE,TRANDATE) injection 10 mg, 10 mg, Intravenous, Q10 min PRN, Samantha J Rhyne, PA-C .  magnesium sulfate IVPB 2 g 50 mL, 2 g, Intravenous, Daily PRN, Samantha J Rhyne, PA-C .  metoprolol (LOPRESSOR) injection 2-5 mg, 2-5 mg, Intravenous, Q2H PRN, Samantha J Rhyne, PA-C .  morphine 4 MG/ML injection 4 mg, 4 mg, Intravenous, Q2H PRN, Chuck Hint, MD, 4 mg at 10/26/16 0147 .  ondansetron (ZOFRAN) injection 4 mg, 4 mg, Intravenous, Q6H PRN, Samantha J Rhyne, PA-C .  pantoprazole (PROTONIX) EC tablet 40 mg, 40 mg, Oral, Daily, Samantha J Rhyne, PA-C, 40 mg at 10/28/16 0941 .  phenol (CHLORASEPTIC) mouth spray 1 spray, 1 spray, Mouth/Throat, PRN, Samantha J Rhyne, PA-C .  polyethylene glycol (MIRALAX / GLYCOLAX) packet 17 g, 17 g, Oral, Daily PRN, [place] [employer] Rhyne, PA-C  Patients Current Diet: Diet heart healthy/carb modified Room service appropriate? Yes; Fluid consistency: Thin  Precautions /  Restrictions Precautions Precautions: Fall Precaution Comments: noted R great and second toes are bruised/blue and cool Restrictions Weight Bearing Restrictions: No  Has the patient had 2 or more falls or a fall with injury in the past year?No  Prior Activity Level Limited Community (1-2x/wk): Went out 1-2 X a week, was driving.  Home Assistive Devices / Equipment Home Assistive Devices/Equipment: Cane (specify quad or straight) Home Equipment: Walker - 2 wheels  Prior Device Use: Indicate devices/aids used by the patient prior to current illness, exacerbation or injury? Cane  Prior Functional Level Prior Function Level of Independence: Independent Comments: reports he does not work, drives  Self Care: Did the patient need help bathing, dressing, using the toilet or eating?  Independent  Indoor Mobility: Did the patient need assistance with walking from room to room (with or without device)? Independent  Stairs: Did the patient need assistance with internal or external stairs (with or without device)? Independent  Functional Cognition: Did the patient need help planning regular tasks such as shopping or remembering to take medications? Independent  Current Functional Level Cognition  Overall Cognitive Status: No family/caregiver present to determine baseline cognitive functioning Orientation Level: Oriented X4    Extremity Assessment (includes Sensation/Coordination)  Upper Extremity Assessment: RUE deficits/detail RUE Deficits / Details: limited RUE use due to residual weakness and limited range from prior CVA RUE Sensation: decreased light touch RUE Coordination: decreased fine motor, decreased gross motor (no functional use)  Lower Extremity Assessment: Defer to PT evaluation RLE Deficits / Details: residual Right LE deficits from prior CVA, additionally, unable to fully assess at this time dur to pain RLE: Unable to fully assess due to pain RLE  Sensation: decreased light touch, decreased proprioception RLE Coordination: decreased fine motor, decreased gross motor    ADLs  Overall ADL's : Needs assistance/impaired Eating/Feeding: Set up, Sitting Grooming: Wash/dry hands, Wash/dry face, Oral care, Sitting, Set up Upper Body Bathing: Set up, Sitting Lower Body Bathing: Total assistance, Sit to/from stand Upper Body Dressing : Set up, Sitting Lower Body Dressing: Total assistance, Sit to/from stand Toilet Transfer: Minimal assistance, Stand-pivot, +2 for safety/equipment Toilet Transfer Details (indicate cue type and reason): simulated to chair Toileting- Clothing Manipulation and Hygiene: Total assistance, Sit to/from stand Functional mobility during ADLs:  (unable to ambulate) General ADL Comments: Pt with decreased tolerance of weight on R LE which is also his weak side.    Mobility  Overal bed mobility: Needs Assistance Bed Mobility: Supine to Sit Supine to sit: Min assist General bed mobility comments: increased time, minA for R LE management off bed and back into bed    Transfers  Overall transfer level: Needs assistance Equipment used: 1 person hand held assist Transfers: Sit to/from Stand Sit to Stand: Min assist Stand pivot transfers: Mod assist, +2 physical assistance General transfer comment: min guard for safety, pt impulsively stood up grabing onto RW    Ambulation / Gait / Stairs / Wheelchair Mobility  Ambulation/Gait Ambulation/Gait assistance: Architect (Feet): 25 Feet Assistive device: Rolling walker (2 wheeled) Gait Pattern/deviations: Antalgic General Gait Details: step to pattern, increased R LE pain, dec R LE WBing tolerance Gait velocity: slow Gait velocity interpretation: Below normal speed for age/gender    Posture / Balance Balance Overall balance assessment: Needs assistance Sitting-balance support: Feet supported, No upper extremity supported Sitting  balance-Leahy Scale: Good Standing balance support: Bilateral upper extremity supported Standing balance-Leahy Scale: Poor Standing balance comment: reliance on UE support    Special needs/care consideration BiPAP/CPAP No CPM No Continuous Drip IV No Dialysis No       Life Vest No Oxygen  No Special Bed No Trach Size No Wound Vac (area) No     Skin Has healing right thigh/lower leg surgical incision                             Bowel mgmt: Last BM 10/27/16 Bladder mgmt: Voiding in bathroom with assistance Diabetic mgmt Yes, or oral medication at home    Previous Home Environment Living Arrangements: Alone Available Help at Discharge: Family, Available PRN/intermittently (sister in law) Type of Home: Apartment Home Layout: One level Home Access: Level entry Bathroom Shower/Tub: Engineer, manufacturing systems: Standard Home Care Services: No  Discharge Living Setting Plans for Discharge Living Setting: Alone, Apartment (Plans to go home alone.) Type of Home at Discharge: Apartment Discharge Home Layout: One level Discharge Home Access: Level entry Does the patient have any problems obtaining your medications?: No  Social/Family/Support Systems Patient Roles: Other (Comment) (Has a brother and sister-in-law.) Contact Information: Onnie Boer - brother and Gaylord Shih - sister in law Anticipated Caregiver: self with stop by help from brother/sister in law Anticipated Caregiver's Contact Information: See emergency contacts Caregiver Availability: Intermittent Discharge Plan Discussed with Primary Caregiver: Yes Is Caregiver In Agreement with Plan?: Yes Does Caregiver/Family have Issues with Lodging/Transportation while Pt is in Rehab?: No  Goals/Additional Needs Patient/Family Goal for Rehab: PT mod I, OT mod I and supervision goals Expected length of stay: 10-14 days (Anticipate it could be shorter LOS.) Cultural Considerations: None Dietary Needs: Heart healthy,  carb mod, thin liquids Equipment Needs: TBD Additional Information: I spoke with patient's sister in law.  Brother and sister in law can check in on patient and get groceries, run errands for patient after discharge. Pt/Family Agrees to Admission and willing to participate: Yes Program Orientation Provided & Reviewed with Pt/Caregiver Including Roles  & Responsibilities: Yes  Decrease burden of Care through IP rehab admission: N/A  Possible need for SNF placement upon discharge: Not anticipated  Patient Condition: This patient's medical and functional status has changed since the consult dated: 10/26/16 in which the Rehabilitation Physician determined and documented that the patient's condition is appropriate for intensive rehabilitative care in an inpatient rehabilitation facility. See "History of Present Illness" (above) for medical update. Functional changes are:  Currently requiring min assist to ambulate 25 feet RW. Patient's medical and functional status update has been discussed with the Rehabilitation physician and patient remains appropriate for inpatient rehabilitation. Will admit to inpatient rehab today.  Preadmission Screen Completed By:  Ronald Mage, 10/28/2016 11:37 AM ______________________________________________________________________   Discussed status with Dr. Wynn Banker on 10/28/16 at 1149 and received telephone approval for admission today.  Admission Coordinator:  Ronald Mage, time 1149/Date 10/28/16       Cosigned by: Erick Colace, MD at 10/28/2016 12:27 PM  Revision History

## 2016-10-29 ENCOUNTER — Inpatient Hospital Stay (HOSPITAL_COMMUNITY): Payer: Medicaid Other | Admitting: Occupational Therapy

## 2016-10-29 ENCOUNTER — Inpatient Hospital Stay (HOSPITAL_COMMUNITY): Payer: Medicaid Other

## 2016-10-29 DIAGNOSIS — E1142 Type 2 diabetes mellitus with diabetic polyneuropathy: Secondary | ICD-10-CM

## 2016-10-29 DIAGNOSIS — E876 Hypokalemia: Secondary | ICD-10-CM

## 2016-10-29 DIAGNOSIS — I10 Essential (primary) hypertension: Secondary | ICD-10-CM

## 2016-10-29 DIAGNOSIS — D62 Acute posthemorrhagic anemia: Secondary | ICD-10-CM

## 2016-10-29 DIAGNOSIS — E46 Unspecified protein-calorie malnutrition: Secondary | ICD-10-CM

## 2016-10-29 LAB — CBC WITH DIFFERENTIAL/PLATELET
BASOS ABS: 0 10*3/uL (ref 0.0–0.1)
Basophils Relative: 0 %
Eosinophils Absolute: 0.2 10*3/uL (ref 0.0–0.7)
Eosinophils Relative: 2 %
HEMATOCRIT: 33.3 % — AB (ref 39.0–52.0)
Hemoglobin: 11.3 g/dL — ABNORMAL LOW (ref 13.0–17.0)
LYMPHS PCT: 24 %
Lymphs Abs: 1.7 10*3/uL (ref 0.7–4.0)
MCH: 30.8 pg (ref 26.0–34.0)
MCHC: 33.9 g/dL (ref 30.0–36.0)
MCV: 90.7 fL (ref 78.0–100.0)
Monocytes Absolute: 0.6 10*3/uL (ref 0.1–1.0)
Monocytes Relative: 8 %
NEUTROS ABS: 4.5 10*3/uL (ref 1.7–7.7)
NEUTROS PCT: 66 %
Platelets: 151 10*3/uL (ref 150–400)
RBC: 3.67 MIL/uL — AB (ref 4.22–5.81)
RDW: 13.3 % (ref 11.5–15.5)
WBC: 6.9 10*3/uL (ref 4.0–10.5)

## 2016-10-29 LAB — GLUCOSE, CAPILLARY
GLUCOSE-CAPILLARY: 125 mg/dL — AB (ref 65–99)
GLUCOSE-CAPILLARY: 137 mg/dL — AB (ref 65–99)
Glucose-Capillary: 136 mg/dL — ABNORMAL HIGH (ref 65–99)
Glucose-Capillary: 127 mg/dL — ABNORMAL HIGH (ref 65–99)

## 2016-10-29 LAB — COMPREHENSIVE METABOLIC PANEL
ALBUMIN: 2.9 g/dL — AB (ref 3.5–5.0)
ALT: 37 U/L (ref 17–63)
AST: 21 U/L (ref 15–41)
Alkaline Phosphatase: 71 U/L (ref 38–126)
Anion gap: 9 (ref 5–15)
BILIRUBIN TOTAL: 0.8 mg/dL (ref 0.3–1.2)
BUN: 6 mg/dL (ref 6–20)
CHLORIDE: 105 mmol/L (ref 101–111)
CO2: 26 mmol/L (ref 22–32)
Calcium: 8.4 mg/dL — ABNORMAL LOW (ref 8.9–10.3)
Creatinine, Ser: 0.72 mg/dL (ref 0.61–1.24)
GFR calc Af Amer: >60 mL/min (ref >60)
GLUCOSE: 101 mg/dL — AB (ref 65–99)
Potassium: 3.3 mmol/L — ABNORMAL LOW (ref 3.5–5.1)
Sodium: 140 mmol/L (ref 135–145)
TOTAL PROTEIN: 5.7 g/dL — AB (ref 6.5–8.1)

## 2016-10-29 MED ORDER — POTASSIUM CHLORIDE CRYS ER 20 MEQ PO TBCR
20.0000 meq | EXTENDED_RELEASE_TABLET | Freq: Two times a day (BID) | ORAL | Status: AC
  Administered 2016-10-29 – 2016-10-30 (×4): 20 meq via ORAL
  Filled 2016-10-29 (×4): qty 1

## 2016-10-29 MED ORDER — PRO-STAT SUGAR FREE PO LIQD
30.0000 mL | Freq: Two times a day (BID) | ORAL | Status: DC
  Administered 2016-10-29 – 2016-11-01 (×7): 30 mL via ORAL
  Filled 2016-10-29 (×5): qty 30

## 2016-10-29 NOTE — Progress Notes (Signed)
Social Work Patient ID: Laverle HobbyKevin Nudo, male   DOB: 11-Oct-1960, 56 y.o.   MRN: [id number]  Team feels pt will reach mod/I goals by Monday, MD agreeable to this plan as long as medically stable. Pt very pleased with the Plan and will look forward to Monday. Agreeable to ordering a tub bench, he has a cane already. Not eligible for follow up due to medicaid and their guidelines for follow up therapies. Dan-PA aware.

## 2016-10-29 NOTE — Progress Notes (Signed)
Social Work Assessment and Plan Social Work Assessment and Plan  Patient Details  Name: Ronald Forbes MRN: [id number] Date of Birth: February 01, 1961  Today's Date: 10/29/2016  Problem List:  Patient Active Problem List   Diagnosis Date Noted  . Type 2 diabetes mellitus with peripheral neuropathy (HCC)   . Benign essential HTN   . Hypokalemia   . Hypoalbuminemia due to protein-calorie malnutrition (HCC)   . Acute blood loss anemia   . Debilitated 10/28/2016  . Ischemia of right lower extremity 10/25/2016  . PAD (peripheral artery disease) (HCC) 10/20/2016  . Thyroid nodule 02/29/2016  . 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 Cerebrovascular accident (CVA) due to embolism of left posterior cerebral artery (HCC)   . Diabetes mellitus type 2 in obese (HCC) 02/07/2016  . HLD (hyperlipidemia)   . Tobacco abuse 02/04/2016  . Alcohol use disorder (HCC) 02/04/2016  . Morbid obesity (HCC) 02/04/2016  . CVA (cerebral vascular accident) (HCC) 02/04/2016   Past Medical History:  Past Medical History:  Diagnosis Date  . Alcohol use disorder (HCC)   . Cerebral vascular accident (HCC)   . 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 Diabetes mellitus type 2 in obese (HCC)   . HLD (hyperlipidemia)   . Morbid obesity (HCC)   . Stroke (HCC)   . Tobacco abuse    Past Surgical History:  Past Surgical History:  Procedure Laterality Date  . ABDOMINAL AORTOGRAM W/LOWER EXTREMITY Right 10/20/2016   Procedure: Abdominal Aortogram w/Lower Extremity;  Surgeon: Maeola Harman, MD;  [facility] INVASIVE CV LAB;  Service: Cardiovascular;  Laterality: Right;  lower leg  . EP IMPLANTABLE DEVICE N/A 02/09/2016   Procedure: Loop Recorder Insertion;  Surgeon: Duke Salvia, MD;  Location: [facility] INVASIVE CV LAB;  Service: Cardiovascular;  Laterality: N/A;  . FEMORAL-TIBIAL BYPASS GRAFT Right 10/25/2016   Procedure: BYPASS GRAFT FEMORAL-TIBIAL ARTERY USING NON  REVERSED SAPPHENOUS VEIN;  Surgeon: Sherren Kerns, MD;  Location: [facility] OR;  Service: Vascular;  Laterality: Right;  . INTRAOPERATIVE ARTERIOGRAM Right 10/25/2016   Procedure: INTRA OPERATIVE ARTERIOGRAM;  Surgeon: Sherren Kerns, MD;  Location: [facility] OR;  Service: Vascular;  Laterality: Right;  . TEE WITHOUT CARDIOVERSION N/A 02/06/2016   Procedure: TRANSESOPHAGEAL ECHOCARDIOGRAM (TEE);  Surgeon: Wendall Stade, MD;  Location: [facility] ENDOSCOPY;  Service: Cardiovascular;  Laterality: N/A;   Social History:  reports that he has been smoking Cigarettes.  He has a 40.00 pack-year smoking history. He has never used smokeless tobacco. He reports that he drinks alcohol. He reports that he uses drugs, including Marijuana.  Family / Support Systems Marital Status: Single Patient Roles: Other (Comment) (Sibling) Other Supports: Guy-brother [phone]-home [phone]  Ruth-sister in-law [phone] Anticipated Caregiver: Self and brother to check in on him Ability/Limitations of Caregiver: Brother not able to provide more than intermittent checks Caregiver Availability: Intermittent Family Dynamics: Pt is close with his brother and sister in-law and they will check in on him, but he needs to be mod/i before going home. He wants to be home and not here in the hospital.  Social History Preferred language: English Religion:  Cultural Background: No issues Education: High School Read: Yes Write: Yes Employment Status: Disabled Fish farm manager Issues: No issues Guardian/Conservator: none-according to MD pt is capable of making his own decisions while here   Abuse/Neglect Physical Abuse: Denies Verbal Abuse: Denies Sexual Abuse: Denies Exploitation of patient/patient's resources: Denies Self-Neglect: Denies  Emotional Status Pt's affect,  behavior adn adjustment status: Pt seems very angry being here and not willing to open up about his concerns. He feels he is back to the way he was and  wonders why he is here. Will discuss with team and get back with pt Recent Psychosocial Issues: other health issues-hemiparesis form previous CVA 05/2016 Pyschiatric History: No issues deferred depression screen pt is not very open with discussing his concerns and appears angry with being on the rehab unit.  Input from team was he was the same with all of us, even MD. Will see if getting discharge date wil help him feel better. Substance Abuse History: Tobacco-ETOH aware of the recommendation of quitting, he feels he is a social drinker. He will think about it, could be contributing to his crankiness, withdrawals.  Patient / Family Perceptions, Expectations & Goals Pt/Family understanding of illness & functional limitations: Pt is able to explain his procedure and knows the precautions. Having pain with and doesn't want to move around as much as therapy wants him too. Premorbid pt/family roles/activities: brother, freind, etc Anticipated changes in roles/activities/participation: resume Pt/family expectations/goals: Pt states: " I am back to where I was, ready to go home."  [employer] Community Agencies: Other (Comment) [facility] Premorbid Home Care/DME Agencies: Other (Comment) (has a cane) Transportation available at discharge: he was driving and plans to again. Resource referrals recommended: Support group (specify)  Discharge Planning Living Arrangements: Alone Support Systems: Other relatives Type of Residence: Private residence Insurance Resources: Medicaid (specify county) Financial Resources: SSI Financial Screen Referred: Previously completed Living Expenses: Rent Money Management: Patient Does the patient have any problems obtaining your medications?: Yes (Describe) (has no PCP) Home Management: self Patient/Family Preliminary Plans: Return to apartment with his brother and sister in-law checking on him. He needs to be mod/i to be safe at home alone.  He doesn't want to be here and wants to go home ASAP Social Work Anticipated Follow Up Needs: Support Group  Clinical Impression Very guarded gentleman who appears very angry at being on the rehab unit. When asked why did he come, his response didn't want to go to a nursing home. Team feels he is high level and will reach mod/I by Monday. MD agreeable to plan for discharge Monday as long as medically stable. Pt actually smiled and said thank you when told this. Will try to set up PCP and get him a tub bench for home.  Lucy Chrisupree, Anyelo Mccue G 10/29/2016, 2:07 PM

## 2016-10-29 NOTE — Care Management Note (Signed)
Inpatient Rehabilitation Center Individual Statement of Services  Patient Name:  Ronald Forbes  Date:  10/29/2016  Welcome to the Inpatient Rehabilitation Center.  Our goal is to provide you with an individualized program based on your diagnosis and situation, designed to meet your specific needs.  With this comprehensive rehabilitation program, you will be expected to participate in at least 3 hours of rehabilitation therapies Monday-Friday, with modified therapy programming on the weekends.  Your rehabilitation program will include the following services:  Physical Therapy (PT), Occupational Therapy (OT), 24 hour per day rehabilitation nursing, Case Management (Social Worker), Rehabilitation Medicine, Nutrition Services and Pharmacy Services  Weekly team conferences will be held on Wednesday to discuss your progress.  Your Social Worker will talk with you frequently to get your input and to update you on team discussions.  Team conferences with you and your family in attendance may also be held.  Expected length of stay: 4-5 days  Overall anticipated outcome: mod/I level  Depending on your progress and recovery, your program may change. Your Social Worker will coordinate services and will keep you informed of any changes. Your Social Worker's name and contact numbers are listed  below.  The following services may also be recommended but are not provided by the Inpatient Rehabilitation Center:   Driving Evaluations  Home Health Rehabiltiation Services  Outpatient Rehabilitation Services   Arrangements will be made to provide these services after discharge if needed.  Arrangements include referral to agencies that provide these services.  Your insurance has been verified to be:  medicaid Your primary doctor is:  none  Pertinent information will be shared with your doctor and your insurance company.  Social Worker:  Dossie DerBecky [place], SW [phone] or ([phone]  Information discussed  with and copy given to patient by: Lucy Chrisupree, Esley Brooking G, 10/29/2016, 1:43 PM

## 2016-10-29 NOTE — Progress Notes (Signed)
Occupational Therapy Note  Patient Details  Name: Ronald Forbes MRN: [id number] Date of Birth: 03/18/1961  Upon arrival, pt supine in bed. Pt complain of pain from constipation. RN notified. Offered to help pt to toilet, pt declined. Despite efforts of encouragement to participate in in bed/out of bed activities, pt refuses to engage in OT session.   Pt missed time 30 min.    Ronald Forbes 10/29/2016, 4:53 PM

## 2016-10-29 NOTE — Progress Notes (Signed)
Occupational Therapy Assessment and Plan  Patient Details  Name: Ronald Forbes MRN: [id number] Date of Birth: [birth year]  OT Diagnosis: muscle weakness (generalized) Rehab Potential: Rehab Potential (ACUTE ONLY): Good ELOS: 3-5 days   Today's Date: 10/29/2016  Session 1 OT Individual Time: 1001-1055 OT Individual Time Calculation (min): 54 min     Session 2 OT Individual Time: 6720-9470 OT Individual Time Calculation (min): 55 min     Problem List:  Patient Active Problem List   Diagnosis Date Noted  . Type 2 diabetes mellitus with peripheral neuropathy (HCC)   . Benign essential HTN   . Hypokalemia   . Hypoalbuminemia due to protein-calorie malnutrition ([place])   . Acute blood loss anemia   . Debilitated 10/28/2016  . Ischemia of right lower extremity 10/25/2016  . PAD (peripheral artery disease) ([place]) 10/20/2016  . Thyroid nodule 02/29/2016  . 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 Cerebrovascular accident (CVA) due to embolism of left posterior cerebral artery ([place])   . Diabetes mellitus type 2 in obese ([place]) 02/07/2016  . HLD (hyperlipidemia)   . Tobacco abuse 02/04/2016  . Alcohol use disorder ([place]) 02/04/2016  . Morbid obesity ([place]) 02/04/2016  . CVA (cerebral vascular accident) ([place]) 02/04/2016    Past Medical History:  Past Medical History:  Diagnosis Date  . Alcohol use disorder ([place])   . Cerebral vascular accident ([place])   . Cerebrovascular accident (CVA) due to thrombosis of left posterior cerebral artery ([place])   . Diabetes mellitus type 2 in obese ([place])   . HLD (hyperlipidemia)   . Morbid obesity ([place])   . Stroke ([place])   . Tobacco abuse    Past Surgical History:  Past Surgical History:  Procedure Laterality Date  . ABDOMINAL AORTOGRAM W/LOWER EXTREMITY Right 10/20/2016   Procedure: Abdominal Aortogram w/Lower Extremity;  Surgeon: Waynetta Sandy, MD;  Location: [facility];  Service: Cardiovascular;  Laterality: Right;  lower leg  . EP  IMPLANTABLE DEVICE N/A 02/09/2016   Procedure: Loop Recorder Insertion;  Surgeon: Deboraha Sprang, MD;  Location: [facility];  Service: Cardiovascular;  Laterality: N/A;  . FEMORAL-TIBIAL BYPASS GRAFT Right 10/25/2016   Procedure: BYPASS GRAFT FEMORAL-TIBIAL ARTERY USING NON REVERSED SAPPHENOUS VEIN;  Surgeon: Elam Dutch, MD;  Location: [facility];  Service: Vascular;  Laterality: Right;  . INTRAOPERATIVE ARTERIOGRAM Right 10/25/2016   Procedure: INTRA OPERATIVE ARTERIOGRAM;  Surgeon: Elam Dutch, MD;  Location: [facility];  Service: Vascular;  Laterality: Right;  . TEE WITHOUT CARDIOVERSION N/A 02/06/2016   Procedure: TRANSESOPHAGEAL ECHOCARDIOGRAM (TEE);  Surgeon: Josue Hector, MD;  Location[facility];  Service: Cardiovascular;  Laterality: N/A;    Assessment & Plan Clinical Impression: Patient is a 56 y.o. year old male with history of hyperlipidemia, diabetes mellitus, tobacco and alcohol abuse, morbid obesity, CVA with Loop recorder and right side residual weakness June 2017 was discharged to [facility] nursing facility. Per chart review patient lives alone independent with a walker prior to admission. One level entry apartment. He has a sister and brother that check on him. Presented 10/24/2016 with progressive rest pain and numbness to the right lower extremity. Underwent aortogram right lower extremity angiogram that showed occlusion of the right superficial femoral artery as well as anterior tibial artery. Underwent right femoral to tibial peroneal trunk bypass with non-reversed ipsilateral great saphenous vein 10/25/2016 per Dr. Ruta Hinds. Hospital course pain management. Subcutaneous heparin for DVT prophylaxis. Patient transferred to CIR on 10/28/2016 .  Patient currently requires min with basic self-care skills and IADL secondary to muscle weakness and muscle joint tightness, ataxia and decreased coordination and decreased standing balance, hemiplegia and decreased  balance strategies.  Prior to hospitalization, patient could complete ADL and iADL with modified independent .  Patient will benefit from skilled intervention to increase independence with basic self-care skills prior to discharge home .  Anticipate patient will require intermittent supervision for higher level iADL tasks and follow up home health.  OT - End of Session Endurance Deficit: Yes Endurance Deficit Description: limited by pain in R foot;  OT Assessment Rehab Potential (ACUTE ONLY): Good Barriers to Discharge: Decreased caregiver support OT Patient demonstrates impairments in the following area(s): Balance;Behavior;Endurance;Pain OT Basic ADL's Functional Problem(s): Toileting;Dressing;Bathing;Grooming OT Advanced ADL's Functional Problem(s): Simple Meal Preparation OT Transfers Functional Problem(s): Toilet;Tub/Shower OT Additional Impairment(s): None OT Plan OT Intensity: Minimum of 1-2 x/day, 45 to 90 minutes OT Frequency: 5 out of 7 days OT Duration/Estimated Length of Stay: 3-5 days OT Treatment/Interventions: Balance/vestibular training;Cognitive remediation/compensation;Community reintegration;Discharge planning;DME/adaptive equipment instruction;Neuromuscular re-education;Pain management;Patient/family education;Self Care/advanced ADL retraining;UE/LE Strength taining/ROM;Therapeutic Exercise;Therapeutic Activities;UE/LE Coordination activities;Wheelchair propulsion/positioning;Visual/perceptual remediation/compensation OT Basic Self-Care Anticipated Outcome(s): Mod I OT Toileting Anticipated Outcome(s): Mod I OT Bathroom Transfers Anticipated Outcome(s): Mod I OT Recommendation Patient destination: Home Follow Up Recommendations: Home health OT Equipment Recommended: Tub/shower bench   Skilled Therapeutic Intervention  Session 1 Initial eval completed with treatment provided to address functional transfers, establishing plan of care, improved sit<>stand, standing  tolerance, and adapted bathing/dressing skills. Pt reluctant to participate in therapy initially. Discussed goals for OT and purpose of inpatient rehabilitation. Pt agreeable after discussion. Pt ambulated 3 feet to wc w/ SPC and min guard A. Set-up A for UB bathing, Min/Mod A for LB ADLs 2/2 painful R LE and difficulty reaching to thread R pant leg or don R sock. Min guard A for dynamic standing balance when reaching to wash buttocks or pull pants over hips. Pt has developed many compensatory strategies after stroke in 2017 with abnormal movement patterns noted. Pt is able to grasp and utilize R hemi side for some ADL tasks as a stabilizer. Pt left seated in recliner at end of session with needs met.   Session 2 Pt reluctant to participate this afternoon 2/2 headache. Agreeable to OT with max encouragement. Stand-pivot close supervision w/c to tub bench. Demonstrated technique, and pt able to lift B LE's into tub. Discussed home kitchen set-up and how pt will transfer food items. Pt required min guard A to take 5 steps to fridge, but unsafe to transport food item as he was PTA. Tried to use planned fail to help pt understand the need for modified strategies, but continues to decline. Standing balance at washer w/ set-up A to load clothing into top loader. Pt then propelled wc back to room using L UE and L LE. Pt attempted sit<>stand from unlocked wc demonstrating poor safety awareness and impulsivity. Pt left semi-reclined in bed at end of session with needs met.    OT Evaluation Precautions/Restrictions  Precautions Precautions: Fall Precaution Comments: h/o R hemi due to CVA in June 2017 Restrictions Weight Bearing Restrictions: No Pain Pain Assessment Pain Assessment: 0-10 Pain Score: 6  Pain Type: Acute pain Pain Location: Leg Pain Orientation: Right Pain Descriptors / Indicators: Aching Pain Onset: Gradual Pain Intervention(s): Repositioned Home Living/Prior Functioning Home  Living Living Arrangements: Alone Available Help at Discharge: Family, Available PRN/intermittently (reports sister can check in) Type of Home: Apartment Home Access:  Level entry Home Layout: One level Bathroom Shower/Tub: Optometrist:  (unclear)  Lives With: Alone IADL History Homemaking Responsibilities: Yes Meal Prep Responsibility: Primary Laundry Responsibility: Primary Cleaning Responsibility: Primary Shopping Responsibility: Primary Prior Function Level of Independence: Independent with basic ADLs, Independent with homemaking with ambulation (used SPC per report)  Able to Take Stairs?: Yes Driving: Yes ADL ADL ADL Comments: Please see functional navigator Vision/Perception  Vision- History Patient Visual Report: No change from baseline  Cognition Overall Cognitive Status: History of cognitive impairments - at baseline (likely since CVA in 2017) Arousal/Alertness: Awake/alert Orientation Level: Person;Place;Situation Person: Disoriented Place: Oriented Situation: Oriented Year: Other (Comment) ("2000 I don't know") Month: November Day of Week: Correct Memory: Impaired Memory Impairment: Decreased recall of new information Immediate Memory Recall: Sock;Blue;Bed Memory Recall:  (unable to recall with cues) Behaviors: Poor frustration tolerance;Impulsive Safety/Judgment: Impaired Comments: Unclear of baseline cognition due to prior CVA. Pt easily frusturated with this PT during session when discussion in regards to ppurpose of CIR and PT goals.  Sensation Sensation Light Touch: Impaired Detail Light Touch Impaired Details: Impaired RLE Coordination Gross Motor Movements are Fluid and Coordinated: No Coordination and Movement Description: impaired in RLE/RUE Motor  Motor Motor: Abnormal postural alignment and control;Abnormal tone;Hemiplegia;Ataxia Trunk/Postural Assessment  Cervical Assessment Cervical  Assessment: Within Functional Limits Thoracic Assessment Thoracic Assessment: Exceptions to [facility] (R trunk shortening) Lumbar Assessment Lumbar Assessment: Exceptions to [facility] (posterior tilt) Postural Control Postural Control: Deficits on evaluation Trunk Control: impaired Protective Responses: delayed  Balance Balance Balance Assessed: Yes Static Sitting Balance Static Sitting - Level of Assistance: 5: Stand by assistance Dynamic Sitting Balance Dynamic Sitting - Level of Assistance: 5: Stand by assistance Static Standing Balance Static Standing - Level of Assistance: 4: Min assist Dynamic Standing Balance Dynamic Standing - Balance Support: During functional activity Dynamic Standing - Level of Assistance: 4: Min assist Dynamic Standing - Balance Activities: Reaching for objects Extremity/Trunk Assessment RUE Assessment RUE Assessment: Exceptions to [facility] RUE Strength RUE Overall Strength Comments: old R hemi- slight AROM shoulder/elbow, weak grasp    See Function Navigator for Current Functional Status.   Refer to Care Plan for Long Term Goals  Recommendations for other services: None   Discharge Criteria: Patient will be discharged from OT if patient refuses treatment 3 consecutive times without medical reason, if treatment goals not met, if there is a change in medical status, if patient makes no progress towards goals or if patient is discharged from hospital.  The above assessment, treatment plan, treatment alternatives and goals were discussed and mutually agreed upon: by patient  Valma Cava 10/29/2016, 4:23 PM

## 2016-10-29 NOTE — Discharge Instructions (Signed)
Inpatient Rehab Discharge Instructions  Ronald Forbes Discharge date and time: No discharge date for patient encounter.   Activities/Precautions/ Functional Status: Activity: activity as tolerated Diet: diabetic diet Wound Care: keep wound clean and dry Functional status:  ___ No restrictions     ___ Walk up steps independently ___ 24/7 supervision/assistance   ___ Walk up steps with assistance ___ Intermittent supervision/assistance  ___ Bathe/dress independently ___ Walk with walker     __x_ Bathe/dress with assistance ___ Walk Independently    ___ Shower independently ___ Walk with assistance    ___ Shower with assistance ___ No alcohol     ___ Return to work/school ________  Special Instructions:    COMMUNITY REFERRALS UPON DISCHARGE:    None:  NOT ELIGIBLE DUE TO MEDICAID GUIDELINES-HAS HOME EXERCISE PROGRAM    Medical Equipment/Items Ordered:TUB BENCH  Agency/Supplier:[facility]   [id number]   GENERAL COMMUNITY RESOURCES FOR PATIENT/FAMILY: Support Groups:CVA SUPPORT GROUP EVERY SECOND Thursday@ 3:00-4:00 PM ON THE REHAB UNIT QUESTIONS CONTACT [place] [phone]  My questions have been answered and I understand these instructions. I will adhere to these goals and the provided educational materials after my discharge from the hospital.  Patient/Caregiver Signature _______________________________ Date __________  Clinician Signature _______________________________________ Date __________  Please bring this form and your medication list with you to all your follow-up doctor's appointments.

## 2016-10-29 NOTE — IPOC Note (Signed)
Overall Plan of Care [facility]) Patient Details Name: Ronald Forbes MRN: [id number] DOB: 03-15-1961  Admitting Diagnosis: Femoval Bypass Grat  Hospital Problems: Active Problems:   Debilitated   Type 2 diabetes mellitus with peripheral neuropathy (HCC)   Benign essential HTN   Hypokalemia   Hypoalbuminemia due to protein-calorie malnutrition (HCC)   Acute blood loss anemia     Functional Problem List: Nursing Behavior, Pain, Safety, Edema, Skin Integrity, Medication Management, Nutrition  PT Balance, Behavior, Edema, Endurance, Motor, Pain, Safety, Sensory, Skin Integrity  OT Balance, Behavior, Endurance, Pain  SLP    TR         Basic ADL's: OT Toileting, Dressing, Bathing, Grooming     Advanced  ADL's: OT Simple Meal Preparation     Transfers: PT Bed Mobility, Bed to Chair, Car, Occupational psychologist, Research scientist (life sciences): PT Ambulation, Stairs     Additional Impairments: OT None  SLP        TR      Anticipated Outcomes Item Anticipated Outcome  Self Feeding    Swallowing      Basic self-care  Mod I  Toileting  Mod I   Bathroom Transfers Mod I  Bowel/Bladder  Remain continent; no retention, no S/S infection.  Transfers  mod I   Locomotion  mod I household gait; supervision for stairs for community  Communication     Cognition     Pain  Managed at goal 3/10  Safety/Judgment  Increased safety awareness; no falls, injury this admission.   Therapy Plan: PT Intensity: Minimum of 1-2 x/day ,45 to 90 minutes PT Frequency: 5 out of 7 days PT Duration Estimated Length of Stay: 5-7 days OT Intensity: Minimum of 1-2 x/day, 45 to 90 minutes OT Frequency: 5 out of 7 days OT Duration/Estimated Length of Stay: 3-5 days         Team Interventions: Nursing Interventions Patient/Family Education, Disease Management/Prevention, Skin Care/Wound Management, Discharge Planning, Pain Management, Psychosocial Support, Medication Management  PT interventions  Ambulation/gait training, Balance/vestibular training, Cognitive remediation/compensation, Discharge planning, Disease management/prevention, DME/adaptive equipment instruction, Functional mobility training, Neuromuscular re-education, Pain management, Patient/family education, Psychosocial support, Skin care/wound management, Splinting/orthotics, Stair training, Therapeutic Activities, Therapeutic Exercise, UE/LE Strength taining/ROM, UE/LE Coordination activities, Wheelchair propulsion/positioning, Visual/perceptual remediation/compensation  OT Interventions Warden/ranger, Cognitive remediation/compensation, Firefighter, Discharge planning, DME/adaptive equipment instruction, Neuromuscular re-education, Pain management, Patient/family education, Self Care/advanced ADL retraining, UE/LE Strength taining/ROM, Therapeutic Exercise, Therapeutic Activities, UE/LE Coordination activities, Wheelchair propulsion/positioning, Visual/perceptual remediation/compensation  SLP Interventions    TR Interventions    SW/CM Interventions Discharge Planning, Psychosocial Support, Patient/Family Education    Team Discharge Planning: Destination: PT-Home ,OT- Home , SLP-  Projected Follow-up: PT-Home health PT, OT-  Home health OT, SLP-  Projected Equipment Needs: PT-None recommended by PT, OT- Tub/shower bench, SLP-  Equipment Details: PT-pt has SPC and used this PTA, OT-  Patient/family involved in discharge planning: PT- Patient,  OT-Patient, SLP-   MD ELOS: 3-5 days. Medical Rehab Prognosis:  Good Assessment: 56 y.o.right handed malewith history of hyperlipidemia, diabetes mellitus, tobacco and alcohol abuse, morbid obesity, CVA with Loop recorder and right side residual weaknessJune 2017 was discharged to [facility] nursing facility. Presented 10/24/2016 with progressive rest pain and numbness to the right lower extremity. Underwent aortogram right lower extremity angiogram  that showed occlusion of the right superficial femoral artery as well as anterior tibial artery. Underwent right femoral to tibial peroneal trunk bypass with non-reversed ipsilateral great saphenous vein  10/25/2016 per Dr. Fabienne Brunsharles [place]. Hospital course pain management. Pt with resulting functional deficits with mobility, transfers, speech, self-care.  Will set goals for Mod I with PT/OT.  See Team Conference Notes for weekly updates to the plan of care

## 2016-10-29 NOTE — Progress Notes (Signed)
Patient information reviewed and entered into eRehab system by Mikahla Wisor, RN, CRRN, PPS Coordinator.  Information including medical coding and functional independence measure will be reviewed and updated through discharge.    

## 2016-10-29 NOTE — Evaluation (Signed)
Physical Therapy Assessment and Plan  Patient Details  Name: Jonhatan Hearty MRN: [id number] Date of Birth: 03/14/1961  PT Diagnosis: Abnormal posture, Ataxia, Cognitive deficits, Difficulty walking, Edema, Hemiparesis dominant, Impaired cognition, Impaired sensation, Muscle weakness, Pain in joint and Paralysis Rehab Potential: Fair ELOS: 5-7 days   Today's Date: 10/29/2016 PT Individual Time: 0830-0926 PT Individual Time Calculation (min): 56 min    Problem List:  Patient Active Problem List   Diagnosis Date Noted  . Type 2 diabetes mellitus with peripheral neuropathy (HCC)   . Benign essential HTN   . Hypokalemia   . Hypoalbuminemia due to protein-calorie malnutrition ([place])   . Acute blood loss anemia   . Debilitated 10/28/2016  . Ischemia of right lower extremity 10/25/2016  . PAD (peripheral artery disease) ([place]) 10/20/2016  . Thyroid nodule 02/29/2016  . 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 Cerebrovascular accident (CVA) due to embolism of left posterior cerebral artery ([place])   . Diabetes mellitus type 2 in obese ([place]) 02/07/2016  . HLD (hyperlipidemia)   . Tobacco abuse 02/04/2016  . Alcohol use disorder ([place]) 02/04/2016  . Morbid obesity ([place]) 02/04/2016  . CVA (cerebral vascular accident) ([place]) 02/04/2016    Past Medical History:  Past Medical History:  Diagnosis Date  . Alcohol use disorder ([place])   . Cerebral vascular accident ([place])   . Cerebrovascular accident (CVA) due to thrombosis of left posterior cerebral artery ([place])   . Diabetes mellitus type 2 in obese ([place])   . HLD (hyperlipidemia)   . Morbid obesity ([place])   . Stroke ([place])   . Tobacco abuse    Past Surgical History:  Past Surgical History:  Procedure Laterality Date  . ABDOMINAL AORTOGRAM W/LOWER EXTREMITY Right 10/20/2016   Procedure: Abdominal Aortogram w/Lower Extremity;  Surgeon: Waynetta Sandy, MD;  Location: [facility];  Service: Cardiovascular;  Laterality: Right;  lower leg   . EP IMPLANTABLE DEVICE N/A 02/09/2016   Procedure: Loop Recorder Insertion;  Surgeon: Deboraha Sprang, MD;  Location: [facility] [place] [facility];  Service: Cardiovascular;  Laterality: N/A;  . FEMORAL-TIBIAL BYPASS GRAFT Right 10/25/2016   Procedure: BYPASS GRAFT FEMORAL-TIBIAL ARTERY USING NON REVERSED SAPPHENOUS VEIN;  Surgeon: Elam Dutch, MD;  Location: [facility];  Service: Vascular;  Laterality: Right;  . INTRAOPERATIVE ARTERIOGRAM Right 10/25/2016   Procedure: INTRA OPERATIVE ARTERIOGRAM;  Surgeon: Elam Dutch, MD;  Location: [facility];  Service: Vascular;  Laterality: Right;  . TEE WITHOUT CARDIOVERSION N/A 02/06/2016   Procedure: TRANSESOPHAGEAL ECHOCARDIOGRAM (TEE);  Surgeon: Josue Hector, MD;  Location: [facility] ENDOS[facility];  Service: Cardiovascular;  Laterality: N/A;    Assessment & Plan Clinical Impression: Patient is a 56 y.o. year old male with history of hyperlipidemia, diabetes mellitus, tobacco and alcohol abuse, morbid obesity, CVA with Loop recorder and right side residual weaknessJune 2017 was discharged to [facility]. Per chart review patient lives alone independent with a walker prior to admission. One level entry apartment. He has a sister and brother that check on him. Presented 10/24/2016 with progressive rest pain and numbness to the right lower extremity. Underwent aortogram right lower extremity angiogram that showed occlusion of the right superficial femoral artery as well as anterior tibial artery. Underwent right femoral to tibial peroneal trunk bypass with non-reversed ipsilateral great saphenous vein 10/25/2016 per Dr. Ruta Hinds. Hospital course pain management. Subcutaneous heparin for DVT prophylaxis. Physical and occupational therapy evaluations completed 10/26/2016 with recommendations of physical medicine rehabilitation consult..  Patient transferred  to CIR on 10/28/2016 .   Patient currently requires min with mobility secondary to muscle weakness,  muscle joint tightness and muscle paralysis, decreased cardiorespiratoy endurance, impaired timing and sequencing, abnormal tone, unbalanced muscle activation, ataxia and decreased coordination, decreased awareness and decreased safety awareness and decreased sitting balance, decreased standing balance, decreased postural control, hemiplegia and decreased balance strategies.  Prior to hospitalization, patient was modified independent  with mobility and lived with Alone in a Ideal home.  Home access is  Level entry.  Patient will benefit from skilled PT intervention to maximize safe functional mobility, minimize fall risk and decrease caregiver burden for planned discharge home with intermittent assist.  Anticipate patient will benefit from follow up [employer] at discharge.  PT - End of Session Activity Tolerance: Decreased this session Endurance Deficit: Yes Endurance Deficit Description: limited by pain in R foot;  PT Assessment Rehab Potential (ACUTE/IP ONLY): Fair PT Patient demonstrates impairments in the following area(s): Balance;Behavior;Edema;Endurance;Motor;Pain;Safety;Sensory;Skin Integrity PT Transfers Functional Problem(s): Bed Mobility;Bed to Chair;Car;Furniture PT Locomotion Functional Problem(s): Ambulation;Stairs PT Plan PT Intensity: Minimum of 1-2 x/day ,45 to 90 minutes PT Frequency: 5 out of 7 days PT Duration Estimated Length of Stay: 5-7 days PT Treatment/Interventions: Ambulation/gait training;Balance/vestibular training;Cognitive remediation/compensation;Discharge planning;Disease management/prevention;DME/adaptive equipment instruction;Functional mobility training;Neuromuscular re-education;Pain management;Patient/family education;Psychosocial support;Skin care/wound management;Splinting/orthotics;Stair training;Therapeutic Activities;Therapeutic Exercise;UE/LE Strength taining/ROM;UE/LE Coordination activities;Wheelchair propulsion/positioning;Visual/perceptual  remediation/compensation PT Transfers Anticipated Outcome(s): mod I  PT Locomotion Anticipated Outcome(s): mod I household gait; supervision for stairs for community PT Recommendation Follow Up Recommendations: Home health PT Patient destination: Home Equipment Recommended: None recommended by PT Equipment Details: pt has SPC and used this PTA  Skilled Therapeutic Intervention Evaluation completed (see details above and below) with education on PT POC and goals and individual treatment initiated with focus on functional transfers, gait training with SPC on various surfaces including over carpet, up/down ramp, and on tile, stair negotiation for functional strengthening and community mobility, and education in regards to mobility progression. Pt unreceptive to feedback or progression of mobility and easily frustrated throughout session when PT making recommendations.    PT Evaluation Precautions/Restrictions Precautions Precautions: Fall Precaution Comments: h/o R hemi due to CVA in June 2017 Restrictions Weight Bearing Restrictions: No Pain 8/10 pain - medication given during session Home Living/Prior Functioning Home Living Available Help at Discharge: Family;Available PRN/intermittently (reports sister can check in) Type of Home: Apartment Home Access: Level entry Home Layout: One level  Lives With: Alone Prior Function Level of Independence: Requires assistive device for independence;Independent with transfers;Independent with gait;Independent with basic ADLs;Independent with homemaking with ambulation (used SPC per report)  Able to Take Stairs?: Yes Driving: Yes Vision/Perception  Perception Perception:  (need to assess further)  Cognition Overall Cognitive Status: No family/caregiver present to determine baseline cognitive functioning Arousal/Alertness: Awake/alert Behaviors: Poor frustration tolerance;Impulsive Safety/Judgment: Impaired Comments: Unclear of baseline  cognition due to prior CVA. Pt easily frusturated with this PT during session when discussion in regards to ppurpose of CIR and PT goals.   Demonstrates impairments in problem solving, awareness, and safety.  Sensation Sensation Light Touch: Impaired Detail Light Touch Impaired Details: Impaired RLE Coordination Gross Motor Movements are Fluid and Coordinated: No Coordination and Movement Description: impaired in RLE/RUE Motor  Motor Motor: Abnormal postural alignment and control;Abnormal tone;Hemiplegia;Ataxia      Trunk/Postural Assessment  Cervical Assessment Cervical Assessment: Within Functional Limits Thoracic Assessment Thoracic Assessment: Exceptions to [facility] (R trunk shortening) Lumbar Assessment Lumbar Assessment: Exceptions to [facility] (posterior tilt) Postural Control Postural Control: Deficits on  evaluation Trunk Control: impaired Protective Responses: delayed  Balance Balance Balance Assessed: Yes Static Sitting Balance Static Sitting - Level of Assistance: 5: Stand by assistance Dynamic Sitting Balance Dynamic Sitting - Level of Assistance: 5: Stand by assistance Static Standing Balance Static Standing - Level of Assistance: 4: Min assist Dynamic Standing Balance Dynamic Standing - Level of Assistance: 4: Min assist Extremity Assessment      RLE Assessment RLE Assessment: Exceptions to [facility] RLE Strength RLE Overall Strength Comments: no active DF (pt reports this was premorbid from CVA but did not wear any kind of AFO), 3-/5 at hip and knee LLE Assessment LLE Assessment: Exceptions to [facility] LLE Strength LLE Overall Strength Comments: grossly 4/5   See Function Navigator for Current Functional Status.   Refer to Care Plan for Long Term Goals  Recommendations for other services: None   Discharge Criteria: Patient will be discharged from PT if patient refuses treatment 3 consecutive times without medical reason, if treatment goals not met, if there is a  change in medical status, if patient makes no progress towards goals or if patient is discharged from hospital.  The above assessment, treatment plan, treatment alternatives and goals were discussed and mutually agreed upon: by patient  Juanna Cao, PT, DPT  10/29/2016, 1:42 PM

## 2016-10-29 NOTE — Progress Notes (Signed)
PHYSICAL MEDICINE & REHABILITATION     PROGRESS NOTE  Subjective/Complaints:  Pt seen laying in bed this AM.  He nods that he slept well overnight.  He has limited verbalization and appears angry.    ROS: Denies CP, SOB, N/V/D.  Objective: Vital Signs: Blood pressure (!) 145/88, pulse 67, temperature 98.6 F (37 C), temperature source Oral, resp. rate 17, height 6\' 3"  (1.905 m), weight 114.2 kg (251 lb 12.8 oz), SpO2 97 %. No results found.  Recent Labs  10/28/16 1508 10/29/16 0548  WBC 7.7 6.9  HGB 11.6* 11.3*  HCT 34.4* 33.3*  PLT 140* 151    Recent Labs  10/28/16 1508 10/29/16 0548  NA  --  140  K  --  3.3*  CL  --  105  GLUCOSE  --  101*  BUN  --  6  CREATININE 0.71 0.72  CALCIUM  --  8.4*   CBG (last 3)   Recent Labs  10/28/16 1628 10/28/16 2243 10/29/16 0636  GLUCAP 121* 119* 125*    Wt Readings from Last 3 Encounters:  10/28/16 114.2 kg (251 lb 12.8 oz)  10/21/16 111.6 kg (246 lb 0.5 oz)  05/21/16 113.4 kg (250 lb)    Physical Exam:  BP (!) 145/88 (BP Location: Left Arm)   Pulse 67   Temp 98.6 F (37 C) (Oral)   Resp 17   Ht 6\' 3"  (1.905 m)   Wt 114.2 kg (251 lb 12.8 oz)   SpO2 97%   BMI 31.47 kg/m  Gen: NAD. Well-developed.  Head: Normocephalic. Atraumatic. Eyes: EOMI. No discharge Cardiovascular: Normal rate, regular rhythm. No JVD.   Respiratory: Effort normal and breath sounds normal. GI: Soft. Bowel sounds are normal.  Musculoskeletal.+1RUE/RLE edema. No tenderness. Neurological: He is alert.  Follows simple commands.  Motor: RUE: 4-/5 prox to distal.  RLE: HF 3/5, KE 3/5, 2/5 ADF/PF (pain inhibition).  LLE/LUE: 5/5 Skin. Right first, second, and fifth toes dusky. Surgical site c/d/I. Psych: Angry/Upset. Flat.   Assessment/Plan: 1. Functional deficits secondary to PAD s/p RLE bypass graft which require 3+ hours per day of interdisciplinary therapy in a comprehensive inpatient rehab setting. Physiatrist is providing  close team supervision and 24 hour management of active medical problems listed below. Physiatrist and rehab team continue to assess barriers to discharge/monitor patient progress toward functional and medical goals.  Function:  Bathing Bathing position   Position: (P) Wheelchair/chair at sink  Bathing parts Body parts bathed by patient: (P) Right arm, Left arm, Chest, Abdomen, Front perineal area, Buttocks, Right upper leg, Left upper leg Body parts bathed by helper: (P) Right lower leg, Left lower leg, Back  Bathing assist Assist Level: (P) Touching or steadying assistance(Pt > 75%)      Upper Body Dressing/Undressing Upper body dressing   What is the patient wearing?: (P) Pull over shirt/dress                Upper body assist        Lower Body Dressing/Undressing Lower body dressing                                  Lower body assist        Toileting Toileting          Toileting assist     Transfers Chair/bed transfer             Locomotion  Ambulation           Wheelchair          Cognition Comprehension    Expression    Social Interaction    Problem Solving    Memory      Medical Problem List and Plan: 1.  Decreased functional mobility with right lower extremity weakness and pain  secondary to PAD s/p RLE bypass graft 10/25/2016. History of baseline right hemiparesis due to old CVA June 2017 S/P loop recorder  Begin CIR  Records reviewed 2.  DVT Prophylaxis/Anticoagulation: Subcutaneous heparin. Monitor platelet counts and any signs of bleeding 3. Pain Management: Hydrocodone as needed 4. Mood: Provide emotional support 5. Neuropsych: This patient is capable of making decisions on his own behalf. 6. Skin/Wound Care: Routine skin checks 7. Fluids/Electrolytes/Nutrition: Routine I&O 8. Diabetes mellitus with peripheral neuropathy. Latest hemoglobin A1c 6.6. Presently on SSI. Patient diet controlled prior to admission  Monitor  with increased mobility 9. Hyperlipidemia. Lipitor 10. History of alcohol tobacco abuse. Provide counseling 11. Morbid obesity. Dietary follow-up 12. Constipation. Laxative assist 13. HTN  Likely reactive  Monitor with increased mobility 14. Hypokalemia  K+ 3.3 on 3/16  Supplemented x2 days 3/15  Cont to monitor  Labs ordered for Monday 15. Hypoalbuminemia  Supplement initiated 3/16 16. ABLA  Hb 11.3 on 3/16  Cont to monitor  LOS (Days) 1 A FACE TO FACE EVALUATION WAS PERFORMED  Christabella Alvira Karis Juba 10/29/2016 10:53 AM

## 2016-10-30 ENCOUNTER — Inpatient Hospital Stay (HOSPITAL_COMMUNITY): Payer: Medicaid Other

## 2016-10-30 ENCOUNTER — Inpatient Hospital Stay (HOSPITAL_COMMUNITY): Payer: Medicaid Other | Admitting: Physical Therapy

## 2016-10-30 DIAGNOSIS — I739 Peripheral vascular disease, unspecified: Secondary | ICD-10-CM

## 2016-10-30 LAB — GLUCOSE, CAPILLARY
GLUCOSE-CAPILLARY: 98 mg/dL (ref 65–99)
GLUCOSE-CAPILLARY: 117 mg/dL — AB (ref 65–99)
GLUCOSE-CAPILLARY: 143 mg/dL — AB (ref 65–99)
Glucose-Capillary: 134 mg/dL — ABNORMAL HIGH (ref 65–99)

## 2016-10-30 NOTE — Plan of Care (Signed)
Problem: RH BOWEL ELIMINATION Goal: RH STG MANAGE BOWEL WITH ASSISTANCE STG Manage Bowel with supervision.   Outcome: Not Progressing LBM 3/14; laxative given per report

## 2016-10-30 NOTE — Progress Notes (Signed)
Occupational Therapy Note  Patient Details  Name: Ronald HobbyKevin Kalbfleisch MRN: [id number] Date of Birth: [birth year]  Today's Date: 10/30/2016 OT Missed Time:  60 min Missed Time Reason:  refusal/fatigue  Entered room with pt reclined in chair. Pt initially not responding to therapists greeting, acknowledging presence, or looking at therapist. Pt says he is tired, begins to swear and says, "I dont understand why you all (therapy staff) leave me alone, I do not want any of your help." Educated pt on CIR process, importance of participation, benefits of engagement in therapy, and general weakness that occurs from sitting all day. Pt continues to express wishes to go home. Educated pt on grad day therapies and importance to participate tomorrow in order to finalize d/c. Pt verbalizes understanding, but still refuses this session.   Elenore PaddyStephanie M Gurvir Schrom 10/30/2016, 5:06 PM

## 2016-10-30 NOTE — Progress Notes (Signed)
Ronald Forbes is a 56 y.o. male 02-19-1961 [id number]  Subjective: No new complaints. No new problems. Reports pain controlled with meds prn. Slept poorly.  Objective: Vital signs in last 24 hours: Temp:  [98.6 F (37 C)] 98.6 F (37 C) (03/17 0500) Pulse Rate:  [69-72] 72 (03/17 0500) Resp:  [17-18] 17 (03/17 0500) BP: (119-157)/(71-83) 119/71 (03/17 0500) SpO2:  [99 %-100 %] 99 % (03/17 0500) Weight change:  Last BM Date: 10/27/16  Intake/Output from previous day: 03/16 0701 - 03/17 0700 In: 720 [P.O.:720] Out: -   Physical Exam General: No apparent distress   Flat affect, disinterested  Lungs: Normal effort. Lungs clear to auscultation, no crackles or wheezes. Cardiovascular: Regular rate and rhythm Musculoskeletal:  RLE with mild general soft tissue swelling thigh to foot Wounds: RLE incision Clean, dry, intact. No signs of infection.  Lab Results: BMET    Component Value Date/Time   NA 140 10/29/2016 0548   NA 136 (A) 02/04/2016   K 3.3 (L) 10/29/2016 0548   CL 105 10/29/2016 0548   CO2 26 10/29/2016 0548   GLUCOSE 101 (H) 10/29/2016 0548   BUN 6 10/29/2016 0548   BUN 15 02/04/2016   CREATININE 0.72 10/29/2016 0548   CALCIUM 8.4 (L) 10/29/2016 0548   GFRNONAA >60 10/29/2016 0548   GFRAA >60 10/29/2016 0548   CBC    Component Value Date/Time   WBC 6.9 10/29/2016 0548   RBC 3.67 (L) 10/29/2016 0548   HGB 11.3 (L) 10/29/2016 0548   HCT 33.3 (L) 10/29/2016 0548   PLT 151 10/29/2016 0548   MCV 90.7 10/29/2016 0548   MCH 30.8 10/29/2016 0548   MCHC 33.9 10/29/2016 0548   RDW 13.3 10/29/2016 0548   LYMPHSABS 1.7 10/29/2016 0548   MONOABS 0.6 10/29/2016 0548   EOSABS 0.2 10/29/2016 0548   BASOSABS 0.0 10/29/2016 0548   CBG's (last 3):    Recent Labs  10/29/16 2048 10/30/16 0642 10/30/16 1137  GLUCAP 137* 98 117*   LFT's Lab Results  Component Value Date   ALT 37 10/29/2016   AST 21 10/29/2016   ALKPHOS 71 10/29/2016   BILITOT 0.8 10/29/2016     Studies/Results: No results found.  Medications:  I have reviewed the patient's current medications. Scheduled Medications: . aspirin  81 mg Oral Daily  . atorvastatin  40 mg Oral q1800  . docusate sodium  100 mg Oral Daily  . feeding supplement (PRO-STAT SUGAR FREE 64)  30 mL Oral BID  . heparin  5,000 Units Subcutaneous Q8H  . insulin aspart  0-15 Units Subcutaneous TID WC  . pantoprazole  40 mg Oral Daily  . potassium chloride  20 mEq Oral BID   PRN Medications: acetaminophen **OR** acetaminophen, bisacodyl, HYDROcodone-acetaminophen, ondansetron **OR** ondansetron (ZOFRAN) IV, polyethylene glycol, sorbitol, traZODone  Assessment/Plan: Principal Problem:   Debilitated Active Problems:   PAD (peripheral artery disease) (HCC)   Ischemia of right lower extremity   Type 2 diabetes mellitus with peripheral neuropathy (HCC)   Benign essential HTN   Hypokalemia   Hypoalbuminemia due to protein-calorie malnutrition (HCC)   Acute blood loss anemia   1. Functional deficits from PAD s/p RLE bypass graft 10/25/16 (and prior R HP following CVA 01/2016) - continue CIR with PT/OT and med mgmt of comorbidities 2. DM2 w/ neuropathy - well controlled by a1c - continue SSI prn 3. hypertension - BP controlled - continue same rx 4. Hyperlipidemia - cont statin 5. MO - continue nutrition counseling  Length of stay, days: 2  Mea Ozga A. Felicity Coyer, MD 10/30/2016, 1:53 PM

## 2016-10-31 ENCOUNTER — Inpatient Hospital Stay (HOSPITAL_COMMUNITY): Payer: Medicaid Other

## 2016-10-31 ENCOUNTER — Inpatient Hospital Stay (HOSPITAL_COMMUNITY): Payer: Medicaid Other | Admitting: Physical Therapy

## 2016-10-31 DIAGNOSIS — I998 Other disorder of circulatory system: Secondary | ICD-10-CM

## 2016-10-31 LAB — GLUCOSE, CAPILLARY
GLUCOSE-CAPILLARY: 104 mg/dL — AB (ref 65–99)
GLUCOSE-CAPILLARY: 144 mg/dL — AB (ref 65–99)
GLUCOSE-CAPILLARY: 110 mg/dL — AB (ref 65–99)
Glucose-Capillary: 105 mg/dL — ABNORMAL HIGH (ref 65–99)

## 2016-10-31 NOTE — Discharge Summary (Signed)
Discharge summary job # [id number]

## 2016-10-31 NOTE — Plan of Care (Signed)
Problem: RH Bathing Goal: LTG Patient will bathe with assist, cues/equipment (OT) LTG: Patient will bathe specified number of body parts with assist with/without cues using equipment (position)  (OT)  Outcome: Not Met (add Reason) Per simulation, pt able to reach 10/10 body parts. Pt refused all opportunities to bathe/participate in OT.  Problem: RH Simple Meal Prep Goal: LTG Patient will perform simple meal prep w/assist (OT) LTG: Patient will perform simple meal prep with assistance, with/without cues (OT).  Outcome: Not Met (add Reason) Pt refused to participate in OT sessions despite education on importance of participation  Problem: RH Tub/Shower Transfers Goal: LTG Patient will perform tub/shower transfers w/assist (OT) LTG: Patient will perform tub/shower transfers with assist, with/without cues using equipment (OT)  Outcome: Not Met (add Reason) Pt refused to participate in therapy, Pt still required supervision for VC for safety awareness when transferring onto TTB.

## 2016-10-31 NOTE — Progress Notes (Signed)
Occupational Therapy Discharge Summary  Patient Details  Name: Ronald Forbes MRN: [id number] Date of Birth: 1961-06-16  Today's Date: 10/31/2016 OT Individual Time:  8:00- 8:45   45 min  Skilled Intervention  Pt seen seated at EOB with Rn administering medicaton. Pt reporting 2/10 pain in R foot. RN aware. Pt reluctant to participate in treatment outside what is needed for discharge. Pt refuses to shower this date. Pt transfers in/out of tub shower with TTB with supervision and VC for safety awareness. Pt reaches and touches all areas he would have to wash if he was taking shower. OT recommend pt lean laterally to wash buttocks. Pt ambulates to toilet and transfers onto toilet with MOD I. With increased time, pt has BM and completes all posterior hygiene/clothing management with MOD I. Pt doff/don pants, socks, and shirt with MOD I. Educated pt on hemi dressing techniques to be used if shirt is tighter, and AE/adaptive strategies (I.e. velcro) for buttons d/t decreased [place] of RUE. Educated pt on strategies to reduce edema in LE such as compression and exercise. Pt verbalizes understanding to education, pt declined oportunity to teach back. Pt stands at sink to wash face and brush teeth with MOD I. Pt completes ambulatory transfer to/from regular bed with MOD I. Pt refused energy conservation education in kitchen, functional mobility training, L NMR, and balance training this date.  Patient has met 6 of 9 long term goals due to ability to compensate for deficits and education on safe DME use.  Patient to discharge at overall Modified Independent level. Pt discharge at MOD I level, however recommending supervision for tub transfers due to decreased insight into deficits and decreased safety awareness.    Reasons goals not met: pt refusal to participate in therapy  Recommendation:  Patient will benefit from ongoing skilled OT services in home health setting to continue to advance functional skills in  the area of BADL and iADL. Recommend working on Hotel manager, functional mobility training, L NMR, energy conservation education. Pt very reluctant to adaptive strategies.   Likely pt will refuse HHOT despite recommendation  Equipment: Tub transfer bench  Reasons for discharge: discharge from hospital  Patient/family agrees with progress made and goals achieved: Yes  OT Discharge Precautions/Restrictions  Precautions Precautions: Fall Precaution Comments: h/o R hemi due to CVA in June 2017 Restrictions Weight Bearing Restrictions: No General   Vital Signs Pain Pain Assessment Pain Assessment: 0-10 Pain Score: 2  Pain Type: Neuropathic pain Pain Location: Foot Pain Orientation: Right Pain Descriptors / Indicators: Aching ADL ADL ADL Comments: Please see functional navigator Vision/Perception  Vision- History Patient Visual Report: No change from baseline  Cognition Overall Cognitive Status: History of cognitive impairments - at baseline Arousal/Alertness: Awake/alert Orientation Level: Oriented X4 Memory: Impaired Sensation Sensation Light Touch: Impaired Detail Light Touch Impaired Details: Impaired RLE Motor  Motor Motor: Abnormal postural alignment and control;Abnormal tone;Hemiplegia;Ataxia Mobility  Transfers Transfers: Sit to Stand Sit to Stand: 6: Modified independent (Device/Increase time)  Trunk/Postural Assessment  Cervical Assessment Cervical Assessment: Within Functional Limits Thoracic Assessment Thoracic Assessment: Exceptions to [facility] (R trunk shortening) Lumbar Assessment Lumbar Assessment: Exceptions to [facility] (posterior tilt) Postural Control Postural Control: Deficits on evaluation Trunk Control: impaired Protective Responses: delayed  Balance Balance Balance Assessed: Yes Static Sitting Balance Static Sitting - Level of Assistance: 6: Modified independent (Device/Increase time) Dynamic Sitting Balance Dynamic Sitting - Level of  Assistance: 6: Modified independent (Device/Increase time) Static Standing Balance Static Standing - Level of Assistance: 6: Modified  independent (Device/Increase time) Dynamic Standing Balance Dynamic Standing - Level of Assistance: 5: Stand by assistance Extremity/Trunk Assessment RUE Assessment RUE Assessment: Exceptions to [facility] RUE Strength RUE Overall Strength Comments: old R hemi- slight AROM shoulder/elbow, weak grasp  LUE Assessment LUE Assessment: Within Functional Limits   See Function Navigator for Current Functional Status.  Ronald Forbes 10/31/2016, 8:41 AM

## 2016-10-31 NOTE — Progress Notes (Signed)
Physical Therapy Note  Patient Details  Name: Ronald Forbes MRN: [name] Date of Birth: [birth year] Today's Date: 10/31/2016    Attempted to see pt for scheduled Grad Day PT session. Pt in recliner stating "It's just too hard, I don't think I can do it" when therapist requested pt go to gym. Pt with increasing agitation & beginning to curse when therapist suggests demonstrating his ability to complete tasks while in CIR before d/c home alone. Pt reports he'll be able to do things at home because he wants to. Pt continues to refuse participation in therapy despite encouragement. Pt missed 75 minutes of skilled PT treatment 2/2 unwillingness to participate.   Sandi MariscalVictoria M Miller 10/31/2016, 10:33 AM

## 2016-10-31 NOTE — Discharge Summary (Signed)
NAMEOLEY, LAHAIE NO.:  [id number]  MEDICAL RECORD NO.:  [id number]  LOCATION:                                 FACILITY:  PHYSICIAN:  Maryla Morrow, MD        DATE OF BIRTH:  1960-11-30  DATE OF ADMISSION:  10/28/2016 DATE OF DISCHARGE:  11/01/2016                              DISCHARGE SUMMARY   DISCHARGE DIAGNOSES: 1. Debilitation after right lower extremity bypass grafting, October 25, 2016. 2. Subcutaneous heparin for DVT prophylaxis. 3. History of cerebrovascular accident, June of 2017, status post loop     recorder. 4. Pain management. 5. Diabetes mellitus, peripheral neuropathy. 6. Hyperlipidemia. 7. History of alcohol and tobacco abuse. 8. Morbid obesity. 9. Constipation. 10.Hypokalemia resolved.  HISTORY OF PRESENT ILLNESS:  This is a 56 year old right-handed male with, history of diabetes mellitus, alcohol, tobacco abuse as well as CVA with loop recorder with right-sided residual weakness in June of 2017.  Lives alone, independent with a walker prior to admission.  He has a sister and a brother who check on him.  He presented on October 24, 2016, with progressive rest pain and numbness to the right lower extremity.  Underwent aortogram, right lower extremity angiogram that showed occlusion of the right superficial femoral artery as well as anterior tibial artery.  Underwent right femoral to tibioperoneal trunk bypass, October 25, 2016, per Dr. Fabienne Bruns.  Subcutaneous heparin for DVT prophylaxis.  Physical and occupational therapy ongoing.  The patient was admitted for a comprehensive rehab program.  PAST MEDICAL HISTORY:  See discharge diagnoses.  SOCIAL HISTORY:  Lives alone.  He has family that checks on him as needed.  FUNCTIONAL STATUS:  Upon admission to rehab services was min-to-mod assist for functional mobility as well as activities of daily living.  PHYSICAL EXAMINATION:  VITAL SIGNS:  Blood pressure 141/85, pulse  64, temperature 98, respirations 18. GENERAL:  This was an alert male. HEENT:  EOMs normal. NECK:  Supple.  Nontender.  No JVD. CARDIAC:  Regular rate and rhythm.  No murmur. ABDOMEN:  Soft, nontender.  Good bowel sounds. LUNGS:  Clear to auscultation without wheeze. SKIN:  Showed right 1st, 2nd, and 5th toes dusky and cool.  Bypass surgical site clean and dry.  REHABILITATION HOSPITAL COURSE:  The patient was admitted to inpatient rehab services with therapies initiated on a 3-hour daily basis, consisting of physical therapy, occupational therapy, and rehabilitation nursing.  The following issues were addressed during the patient's rehabilitation stay.  Pertaining to Mr. Samuel Germany right lower extremity bypass, surgical site healing nicely.  He would follow up with Vascular Surgery.  Pain management with the use of hydrocodone as needed.  He did have a history of diabetes mellitus, peripheral neuropathy.  Hemoglobin A1c of 6.6, full diabetic teaching.  He will continue Lipitor for history of hyperlipidemia.  The patient did have a history of alcohol and tobacco abuse.  He will receive counseling in regard to cessation of these products.  Bouts of constipation resolved with laxative assistance.  Blood pressure is well controlled.  The patient to receive weekly collaborative interdisciplinary team conferences to discuss estimated length of  stay, family teaching, any barriers to discharge. The patient needed some encouragement at times to participate.  He refused participation in therapies on more than 1 occasion.  Currently, minimal assist with mobility secondary to muscle weakness, decreased coordination.  He did show some decreased awareness of safety. Activities of daily living and homemaking.  Again needing encouragement to participate.  Required minimal assist for basic self-care ADLs.  The patient continued to adamantly request discharge to home.  This was discussed with family.   Plan was for discharge, November 01, 2016.  DISCHARGE MEDICATIONS: 1. Aspirin 81 mg daily. 2. Lipitor 40 mg p.o. daily. 3. Colace 100 mg p.o. daily. 4. Protonix 40 mg p.o. daily. 5. Hydrocodone 1 to 2 tablets every 4 as needed pain.  DIET:  Diabetic diet.  FOLLOWUP:  He would follow up with Dr. Maryla MorrowAnkit Patel at the [facility]patient [facility] Office one month; Dr. Fabienne Brunsharles Fields, Vascular Surgery, call for appointment.     Mariam Dollaraniel Sariah Henkin, P.A.   ______________________________ Maryla MorrowAnkit Patel, MD    DA/MEDQ  D:  10/31/2016  T:  10/31/2016  Job:  [id number]  cc:   Janetta Horaharles E. Darrick PennaFields, MD Maryla MorrowAnkit Patel, MD

## 2016-10-31 NOTE — Progress Notes (Signed)
Ronald Forbes is a 56 y.o. male 1960/09/25 [id number]  Subjective: No new complaints. No new problems. Wants today to go by fast to go home tomorrow.  Objective: Vital signs in last 24 hours: Temp:  [98.1 F (36.7 C)-98.3 F (36.8 C)] 98.3 F (36.8 C) (03/18 0446) Pulse Rate:  [64-66] 66 (03/18 0446) Resp:  [16-18] 18 (03/18 0446) BP: (133-150)/(77-85) 150/77 (03/18 0446) SpO2:  [98 %-99 %] 99 % (03/18 0446) Weight change:  Last BM Date: 10/30/16  Intake/Output from previous day: 03/17 0701 - 03/18 0700 In: 720 [P.O.:720] Out: -   Physical Exam General: No apparent distress   In BSC, watching TV Lungs: Normal effort. Lungs clear to auscultation, no crackles or wheezes. Cardiovascular: Regular rate and rhythm, RLE remains diffuse slight swelling Neurological: No new neurological deficits Wounds: RLE incision  Clean, dry, intact. No signs of infection.  Lab Results: BMET    Component Value Date/Time   NA 140 10/29/2016 0548   NA 136 (A) 02/04/2016   K 3.3 (L) 10/29/2016 0548   CL 105 10/29/2016 0548   CO2 26 10/29/2016 0548   GLUCOSE 101 (H) 10/29/2016 0548   BUN 6 10/29/2016 0548   BUN 15 02/04/2016   CREATININE 0.72 10/29/2016 0548   CALCIUM 8.4 (L) 10/29/2016 0548   GFRNONAA >60 10/29/2016 0548   GFRAA >60 10/29/2016 0548   CBC    Component Value Date/Time   WBC 6.9 10/29/2016 0548   RBC 3.67 (L) 10/29/2016 0548   HGB 11.3 (L) 10/29/2016 0548   HCT 33.3 (L) 10/29/2016 0548   PLT 151 10/29/2016 0548   MCV 90.7 10/29/2016 0548   MCH 30.8 10/29/2016 0548   MCHC 33.9 10/29/2016 0548   RDW 13.3 10/29/2016 0548   LYMPHSABS 1.7 10/29/2016 0548   MONOABS 0.6 10/29/2016 0548   EOSABS 0.2 10/29/2016 0548   BASOSABS 0.0 10/29/2016 0548   CBG's (last 3):   Recent Labs  10/30/16 1658 10/30/16 2058 10/31/16 0706  GLUCAP 143* 134* 105*   LFT's Lab Results  Component Value Date   ALT 37 10/29/2016   AST 21 10/29/2016   ALKPHOS 71 10/29/2016   BILITOT  0.8 10/29/2016    Studies/Results: No results found.  Medications:  I have reviewed the patient's current medications. Scheduled Medications: . aspirin  81 mg Oral Daily  . atorvastatin  40 mg Oral q1800  . docusate sodium  100 mg Oral Daily  . feeding supplement (PRO-STAT SUGAR FREE 64)  30 mL Oral BID  . heparin  5,000 Units Subcutaneous Q8H  . insulin aspart  0-15 Units Subcutaneous TID WC  . pantoprazole  40 mg Oral Daily   PRN Medications: acetaminophen **OR** acetaminophen, bisacodyl, HYDROcodone-acetaminophen, ondansetron **OR** ondansetron (ZOFRAN) IV, polyethylene glycol, sorbitol, traZODone  Assessment/Plan: Principal Problem:   Debilitated Active Problems:   PAD (peripheral artery disease) (HCC)   Ischemia of right lower extremity   Type 2 diabetes mellitus with peripheral neuropathy (HCC)   Benign essential HTN   Hypokalemia   Hypoalbuminemia due to protein-calorie malnutrition (HCC)   Acute blood loss anemia   Length of stay, days: 3   1. Functional deficits from PAD s/p RLE bypass graft 10/25/16 (and prior R HP following CVA 01/2016) - continue CIR with PT/OT and med mgmt of co morbidities - note refusal to participate two days with PT this weekend due to planned independence and readiness for DC - while pt is slightly withdrawn, he was not rude or irritable to me  2. DM2 w/ neuropathy - well controlled by a1c - continue SSI prn 3. hypertension - BP controlled - continue same rx 4. Hyperlipidemia - cont statin 5. MO - continue nutrition counseling   Ronald Forbes A. Felicity CoyerLeschber, MD 10/31/2016, 11:09 AM

## 2016-10-31 NOTE — Progress Notes (Signed)
Occupational Therapy Note  Patient Details  Name: Ronald HobbyKevin Folino MRN: [id number] Date of Birth: 23-Aug-1960  Today's Date: 10/31/2016 OT Missed Time:  75 min total Missed Time Reason:   Pt refuses to participate in anything beyond BADLs for d/c home. Pt educated on importance of participation in therapy in prep for d/c home.    Elenore PaddyStephanie M Kayleeann Huxford 10/31/2016, 5:43 PM

## 2016-11-01 DIAGNOSIS — I10 Essential (primary) hypertension: Secondary | ICD-10-CM

## 2016-11-01 LAB — GLUCOSE, CAPILLARY: Glucose-Capillary: 104 mg/dL — ABNORMAL HIGH (ref 65–99)

## 2016-11-01 MED ORDER — HYDROCODONE-ACETAMINOPHEN 5-325 MG PO TABS: 1.0000 | ORAL_TABLET | ORAL | 0 refills | Status: DC | PRN

## 2016-11-01 MED ORDER — ATORVASTATIN CALCIUM 40 MG PO TABS: 40.0000 mg | ORAL_TABLET | Freq: Every day | ORAL | 0 refills | Status: DC

## 2016-11-01 NOTE — Progress Notes (Signed)
Patient discussed the discharged  Instructions with PA before he left.

## 2016-11-01 NOTE — Progress Notes (Signed)
Social Work  Discharge Note  The overall goal for the admission was met for:   Discharge location: [place]-LAW CHECKING IN ON  Length of Stay: Yes-4 DAYS  Discharge activity level: Yes-MOD/I LEVEL  Home/community participation: Yes  Services provided included: MD, RD, PT, OT, RN, CM, Pharmacy and SW  Financial Services: Medicaid  Follow-up services arranged: DME: [facility] and Patient/Family has no preference for HH/DME agencies  Comments (or additional information):PT WAS NOT ELIGIBLE [place]. PT REACHED MOD/I LEVEL AND WANTED TO GO HOME ASAP PT RESISTANT IN PARTICIPATING IN THERAPIES AND REFUSED MULTIPLE TIMES.   Patient/Family verbalized understanding of follow-up arrangements: Yes  Individual responsible for coordination of the follow-up plan: SELF  Confirmed correct DME delivered: [name] 11/01/2016    [name]

## 2016-11-01 NOTE — Patient Care Conference (Signed)
Inpatient RehabilitationTeam Conference and Plan of Care Update Date: 11/01/2016   Time: 12:14 PM    Patient Name: Ronald Forbes      Medical Record Number: [id number]  Date of Birth: 08-19-1960 Sex: Male         Room/Bed: 4W24C/4W24C-01 Payor Info: Payor: MEDICAID [place] / Plan: MEDICAID [place] ACCESS / Product Type: *No Product type* /    Admitting Diagnosis: Femoval Bypass Grat  Admit Date/Time:  10/28/2016  3:03 PM Admission Comments: No comment available   Primary Diagnosis:  Debilitated Principal Problem: Debilitated  Patient Active Problem List   Diagnosis Date Noted  . Reactive hypertension   . Type 2 diabetes mellitus with peripheral neuropathy (HCC)   . Benign essential HTN   . Hypokalemia   . Hypoalbuminemia due to protein-calorie malnutrition ([place])   . Acute blood loss anemia   . Debilitated 10/28/2016  . Ischemia of right lower extremity 10/25/2016  . PAD (peripheral artery disease) ([place]) 10/20/2016  . Thyroid nodule 02/29/2016  . 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 Diabetes mellitus type 2 in obese ([place]) 02/07/2016  . HLD (hyperlipidemia)   . Tobacco abuse 02/04/2016  . Alcohol use disorder ([place]) 02/04/2016  . Morbid obesity ([place]) 02/04/2016  . CVA (cerebral vascular accident) ([place]) 02/04/2016    Expected Discharge Date: Expected Discharge Date: 11/01/16  Team Members Present: Physician leading conference: Dr. Delice Lesch Social Worker Present: Ovidio Kin, LCSW Nurse Present: Heather Roberts, RN PT Present: Carney Living, PT OT Present: Cherylynn Ridges, OT SLP Present: Weston Anna, SLP PPS Coordinator present : Daiva Nakayama, RN, CRRN     Current Status/Progress Goal Weekly Team Focus  Medical     stable for DC   medical stability     Bowel/Bladder   continent of bladder /bowell  maintain continent during hospitalization  monitor and maintain the continence level    Swallow/Nutrition/ Hydration        na     ADL's   Supervision/VC tub transfer, Mod I BADL  Mod I overall   pt/family ed, modified bathing/dressing, safety awareness   Mobility     mod/I transfers and ambulation-some safety issues-learning to compensate for   mod/I level with cane     Communication        na     Safety/Cognition/ Behavioral Observations            Pain   controlled by Vicodin as need it for right leg  less<3  monitor and adress it as need it every shift   Skin   right leg incision is OTA  dry and intact, no breakdown  monitor and assess it every shift      *See Care Plan and progress notes for long and short-term goals.  Barriers to Discharge:   medical stability-wanting to leave ASAP   Possible Resolutions to Barriers:    none   Discharge Planning/Teaching Needs:    Home alone with his brother and sister in-law checking in on him. He is mod/I level with his cane. Did not want to be here and refused multiple therapies.     Team Discussion:  Goals of mod/I level met some safety issues but pt is aware of these. Pt resistant to being here on rehab and wanting to go home ASAP. MD felt medically stable for DC today. Try to set up with new PCP for follow up.  Revisions to  Treatment Plan:  DC today      [name] 11/01/2016, 12:14 PM

## 2016-11-01 NOTE — Progress Notes (Signed)
[place] PHYSICAL MEDICINE & REHABILITATION     PROGRESS NOTE  Subjective/Complaints:  Pt seen laying in bed this AM.  He slept well overnight.  He states this weekend "sucked because I was in the hospital".  He is anxious to go home today.    ROS: Denies CP, SOB, N/V/D.  Objective: Vital Signs: Blood pressure (!) 148/90, pulse (!) 6, temperature 98.7 F (37.1 C), temperature source Oral, resp. rate 18, height 6\' 3"  (1.905 m), weight 114.2 kg (251 lb 12.8 oz), SpO2 98 %. No results found. No results for input(s): WBC, HGB, HCT, PLT in the last 72 hours. No results for input(s): NA, K, CL, GLUCOSE, BUN, CREATININE, CALCIUM in the last 72 hours.  Invalid input(s): CO CBG (last 3)   Recent Labs  10/31/16 1650 10/31/16 2044 11/01/16 0636  GLUCAP 144* 110* 104*    Wt Readings from Last 3 Encounters:  10/28/16 114.2 kg (251 lb 12.8 oz)  10/21/16 111.6 kg (246 lb 0.5 oz)  05/21/16 113.4 kg (250 lb)    Physical Exam:  BP (!) 148/90 (BP Location: Left Arm)   Pulse (!) 6   Temp 98.7 F (37.1 C) (Oral)   Resp 18   Ht 6\' 3"  (1.905 m)   Wt 114.2 kg (251 lb 12.8 oz)   SpO2 98%   BMI 31.47 kg/m  Gen: NAD. Well-developed.  Head: Normocephalic. Atraumatic. Eyes: EOMI. No discharge Cardiovascular: RRR. No JVD.   Respiratory: Effort normal and breath sounds normal. GI: Soft. Bowel sounds are normal.  Musculoskeletal. +1RUE/RLE edema. No tenderness. Neurological: He is alert.  Follows simple commands.  Motor: RUE: 4-/5 prox to distal.  RLE: HF 3/5, KE 3/5, 2/5 ADF/PF.  Mas 1+/4 knee flexors, 2/4 plantar flexors LLE/LUE: 5/5 Skin. Right first, second, and fifth toes dusky. Surgical site c/d/I. Psych: Angry/Upset. Flat.   Assessment/Plan: 1. Functional deficits secondary to PAD s/p RLE bypass graft which require 3+ hours per day of interdisciplinary therapy in a comprehensive inpatient rehab setting. Physiatrist is providing close team supervision and 24 hour management of  active medical problems listed below. Physiatrist and rehab team continue to assess barriers to discharge/monitor patient progress toward functional and medical goals.  Function:  Bathing Bathing position   Position: Wheelchair/chair at sink  Bathing parts Body parts bathed by patient: Right arm, Left arm, Chest, Abdomen, Front perineal area, Buttocks, Right upper leg, Left upper leg Body parts bathed by helper: Right lower leg, Left lower leg, Back  Bathing assist Assist Level: Touching or steadying assistance(Pt > 75%)      Upper Body Dressing/Undressing Upper body dressing   What is the patient wearing?: Pull over shirt/dress     Pull over shirt/dress - Perfomed by patient: Thread/unthread right sleeve, Thread/unthread left sleeve, Put head through opening Pull over shirt/dress - Perfomed by helper: Pull shirt over trunk        Upper body assist Assist Level: Touching or steadying assistance(Pt > 75%)      Lower Body Dressing/Undressing Lower body dressing   What is the patient wearing?: Non-skid slipper socks, Pants     Pants- Performed by patient: Thread/unthread left pants leg, Pull pants up/down Pants- Performed by helper: Thread/unthread right pants leg Non-skid slipper socks- Performed by patient: Don/doff left sock Non-skid slipper socks- Performed by helper: Don/doff right sock                  Lower body assist Assist for lower body dressing: Touching or steadying  assistance (Pt > 75%)      Toileting Toileting Toileting activity did not occur: Refused Toileting steps completed by patient: Adjust clothing prior to toileting, Performs perineal hygiene, Adjust clothing after toileting   Toileting Assistive Devices: Grab bar or rail  Toileting assist Assist level: Supervision or verbal cues   Transfers Chair/bed transfer   Chair/bed transfer method: Stand pivot, Ambulatory Chair/bed transfer assist level: Touching or steadying assistance (Pt >  75%) Chair/bed transfer assistive device: Cane, Armrests     Locomotion Ambulation     Max distance: 30' Assist level: Touching or steadying assistance (Pt > 75%)   Wheelchair       Assist Level: Dependent (Pt equals 0%)  Cognition Comprehension Comprehension assist level: Understands basic 90% of the time/cues < 10% of the time  Expression Expression assist level: Expresses basic 75 - 89% of the time/requires cueing 10 - 24% of the time. Needs helper to occlude trach/needs to repeat words.  Social Interaction Social Interaction assist level: Interacts appropriately 75 - 89% of the time - Needs redirection for appropriate language or to initiate interaction.  Problem Solving Problem solving assist level: Solves basic 75 - 89% of the time/requires cueing 10 - 24% of the time  Memory Memory assist level: Recognizes or recalls 75 - 89% of the time/requires cueing 10 - 24% of the time    Medical Problem List and Plan: 1.  Decreased functional mobility with right lower extremity weakness and pain  secondary to PAD s/p RLE bypass graft 10/25/2016. History of baseline right hemiparesis due to old CVA June 2017 S/P loop recorder  Plan for d/c today  Will see patient for hospital follow up in 1 month.  Will discuss spasticity further at that time.  2.  DVT Prophylaxis/Anticoagulation: Subcutaneous heparin. Monitor platelet counts and any signs of bleeding 3. Pain Management: Hydrocodone as needed 4. Mood: Provide emotional support 5. Neuropsych: This patient is capable of making decisions on his own behalf. 6. Skin/Wound Care: Routine skin checks 7. Fluids/Electrolytes/Nutrition: Routine I&O 8. Diabetes mellitus with peripheral neuropathy. Latest hemoglobin A1c 6.6. Presently on SSI. Patient diet controlled prior to admission  Overall controlled 3/19 9. Hyperlipidemia. Lipitor 10. History of alcohol tobacco abuse. Provide counseling 11. Morbid obesity. Dietary follow-up 12. Constipation.  Laxative assist 13. HTN  Likely reactive  Slightly labile, will need follow up as outpt for continued monitoring 14. Hypokalemia  K+ 3.3 on 3/16  Supplemented x2 days 3/15  Cont to monitor  Labs pending 15. Hypoalbuminemia  Supplement initiated 3/16 16. ABLA  Hb 11.3 on 3/16  Cont to monitor  LOS (Days) 4 A FACE TO FACE EVALUATION WAS PERFORMED  Ankit Karis Juba 11/01/2016 10:08 AM

## 2016-11-01 NOTE — Progress Notes (Signed)
Physical Therapy Discharge Summary  Patient Details  Name: Lavante Toso MRN: [id number] Date of Birth: [birth year]  Today's Date: 11/01/2016   Patient has met 0 of 9 long term goals due refusing to participate in PT treatment sessions despite max encouragement and education on benefits of participation. Pt is discharging at same functional level as day of PT evaluation.   Reasons goals not met: Pt refused to participate in PT treatment sessions despite education and encouragement.   Recommendation:  Patient will benefit from ongoing skilled PT services in home health setting to continue to advance safe functional mobility, address ongoing impairments in impaired balance, decreased endurance, and minimize fall risk.  Equipment: No equipment provided  Reasons for discharge: discharge from hospital  PT Discharge Precautions/Restrictions Precautions Precautions: Fall Restrictions Weight Bearing Restrictions: No   Mobility Bed Mobility Bed Mobility:  (supervision) Transfers Transfers: Yes Sit to Stand: 4: Min guard/min assist  Locomotion  Ambulation Ambulation: Yes Ambulation/Gait Assistance: 4: Min guard/Min assist Ambulation Distance (Feet): 75 Feet Assistive device: Straight cane Stairs / Additional Locomotion Stairs: Yes Stairs Assistance: 4: Min guard/Min assist Number of Stairs: 8    See Function Navigator for Current Functional Status.  Waunita Schooner 11/01/2016, 12:50 PM

## 2016-11-05 ENCOUNTER — Encounter: Payer: Self-pay | Admitting: Vascular Surgery

## 2016-11-07 ENCOUNTER — Emergency Department (HOSPITAL_COMMUNITY)
Admission: EM | Admit: 2016-11-07 | Discharge: 2016-11-07 | Disposition: A | Discharge status: Home / Self Care | Payer: Medicaid Other | Attending: Emergency Medicine | Admitting: Emergency Medicine

## 2016-11-07 ENCOUNTER — Emergency Department (HOSPITAL_BASED_OUTPATIENT_CLINIC_OR_DEPARTMENT_OTHER)
Admit: 2016-11-07 | Discharge: 2016-11-07 | Disposition: A | Discharge status: Home / Self Care | Payer: Medicaid Other | Attending: Emergency Medicine | Admitting: Emergency Medicine

## 2016-11-07 ENCOUNTER — Encounter (HOSPITAL_COMMUNITY): Payer: Self-pay | Admitting: Emergency Medicine

## 2016-11-07 DIAGNOSIS — M7989 Other specified soft tissue disorders: Secondary | ICD-10-CM

## 2016-11-07 DIAGNOSIS — Y939 Activity, unspecified: Secondary | ICD-10-CM | POA: Insufficient documentation

## 2016-11-07 DIAGNOSIS — X58XXXA Exposure to other specified factors, initial encounter: Secondary | ICD-10-CM | POA: Diagnosis not present

## 2016-11-07 DIAGNOSIS — M79671 Pain in right foot: Secondary | ICD-10-CM | POA: Diagnosis not present

## 2016-11-07 DIAGNOSIS — M79609 Pain in unspecified limb: Secondary | ICD-10-CM | POA: Diagnosis not present

## 2016-11-07 DIAGNOSIS — Y929 Unspecified place or not applicable: Secondary | ICD-10-CM | POA: Diagnosis not present

## 2016-11-07 DIAGNOSIS — F17210 Nicotine dependence, cigarettes, uncomplicated: Secondary | ICD-10-CM | POA: Diagnosis not present

## 2016-11-07 DIAGNOSIS — Y999 Unspecified external cause status: Secondary | ICD-10-CM | POA: Diagnosis not present

## 2016-11-07 DIAGNOSIS — E119 Type 2 diabetes mellitus without complications: Secondary | ICD-10-CM | POA: Insufficient documentation

## 2016-11-07 DIAGNOSIS — S79922A Unspecified injury of left thigh, initial encounter: Secondary | ICD-10-CM | POA: Diagnosis present

## 2016-11-07 DIAGNOSIS — S7012XA Contusion of left thigh, initial encounter: Secondary | ICD-10-CM | POA: Diagnosis not present

## 2016-11-07 DIAGNOSIS — Z8673 Personal history of transient ischemic attack (TIA), and cerebral infarction without residual deficits: Secondary | ICD-10-CM | POA: Insufficient documentation

## 2016-11-07 DIAGNOSIS — T148XXA Other injury of unspecified body region, initial encounter: Secondary | ICD-10-CM

## 2016-11-07 LAB — CBC WITH DIFFERENTIAL/PLATELET
BASOS ABS: 0 10*3/uL (ref 0.0–0.1)
Basophils Relative: 0 %
Eosinophils Absolute: 0.2 10*3/uL (ref 0.0–0.7)
Eosinophils Relative: 2 %
HCT: 42.1 % (ref 39.0–52.0)
HEMOGLOBIN: 14.4 g/dL (ref 13.0–17.0)
LYMPHS ABS: 1.9 10*3/uL (ref 0.7–4.0)
LYMPHS PCT: 21 %
MCH: 31.4 pg (ref 26.0–34.0)
MCHC: 34.2 g/dL (ref 30.0–36.0)
MCV: 91.7 fL (ref 78.0–100.0)
Monocytes Absolute: 0.4 10*3/uL (ref 0.1–1.0)
Monocytes Relative: 5 %
NEUTROS PCT: 72 %
Neutro Abs: 6.4 10*3/uL (ref 1.7–7.7)
Platelets: 228 10*3/uL (ref 150–400)
RBC: 4.59 MIL/uL (ref 4.22–5.81)
RDW: 13.5 % (ref 11.5–15.5)
WBC: 8.9 10*3/uL (ref 4.0–10.5)

## 2016-11-07 LAB — COMPREHENSIVE METABOLIC PANEL
ALT: 33 U/L (ref 17–63)
ANION GAP: 11 (ref 5–15)
AST: 17 U/L (ref 15–41)
Albumin: 3.8 g/dL (ref 3.5–5.0)
Alkaline Phosphatase: 87 U/L (ref 38–126)
BILIRUBIN TOTAL: 0.7 mg/dL (ref 0.3–1.2)
BUN: 8 mg/dL (ref 6–20)
CHLORIDE: 104 mmol/L (ref 101–111)
CO2: 24 mmol/L (ref 22–32)
Calcium: 9.4 mg/dL (ref 8.9–10.3)
Creatinine, Ser: 0.66 mg/dL (ref 0.61–1.24)
Glucose, Bld: 115 mg/dL — ABNORMAL HIGH (ref 65–99)
POTASSIUM: 3.8 mmol/L (ref 3.5–5.1)
Sodium: 139 mmol/L (ref 135–145)
TOTAL PROTEIN: 7.5 g/dL (ref 6.5–8.1)

## 2016-11-07 LAB — PROTIME-INR
INR: 1.05
Prothrombin Time: 13.7 seconds (ref 11.4–15.2)

## 2016-11-07 MED ORDER — OXYCODONE-ACETAMINOPHEN 5-325 MG PO TABS: 1.0000 | ORAL_TABLET | Freq: Four times a day (QID) | ORAL | 0 refills | Status: DC | PRN

## 2016-11-07 MED ORDER — MORPHINE SULFATE (PF) 4 MG/ML IV SOLN
4.0000 mg | Freq: Once | INTRAVENOUS | Status: AC
  Administered 2016-11-07: 4 mg via INTRAVENOUS
  Filled 2016-11-07: qty 1

## 2016-11-07 MED ORDER — OXYCODONE-ACETAMINOPHEN 5-325 MG PO TABS
2.0000 | ORAL_TABLET | Freq: Once | ORAL | Status: AC
  Administered 2016-11-07: 2 via ORAL
  Filled 2016-11-07: qty 2

## 2016-11-07 NOTE — Consult Note (Signed)
Hospital Consult    Reason for Consult:  Right foot pain Referring Physician:  Long (ED) MRN #:  [id number]  History of Present Illness: This is a 56 y.o. male s/p right femoral to tp trunk bypass with vein by Dr. Darrick Penna on 3.12. Post operatively he was having significant pain in his foot and discharged to rehab on pod#3. He now returns to ED with persistent pain in his right big and second toes. He has not had fevers or chills or other complaints. Does have associated swelling in right leg that has been present since surgery. Pain has also been present since surgery with exacerbation today relieved with pain meds in ED. No other complaints. He continues to smoke.   Past Medical History:  Diagnosis Date  . Alcohol use disorder (HCC)   . Cerebral vascular accident (HCC)   . 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 Diabetes mellitus type 2 in obese (HCC)   . HLD (hyperlipidemia)   . Morbid obesity (HCC)   . Stroke (HCC)   . Tobacco abuse     Past Surgical History:  Procedure Laterality Date  . ABDOMINAL AORTOGRAM W/LOWER EXTREMITY Right 10/20/2016   Procedure: Abdominal Aortogram w/Lower Extremity;  Surgeon: Maeola Harman, MD;  Location: [facility] INVASIVE CV LAB;  Service: Cardiovascular;  Laterality: Right;  lower leg  . EP IMPLANTABLE DEVICE N/A 02/09/2016   Procedure: Loop Recorder Insertion;  Surgeon: Duke Salvia, MD;  Location: [facility] INVASIVE CV LAB;  Service: Cardiovascular;  Laterality: N/A;  . FEMORAL-TIBIAL BYPASS GRAFT Right 10/25/2016   Procedure: BYPASS GRAFT FEMORAL-TIBIAL ARTERY USING NON REVERSED SAPPHENOUS VEIN;  Surgeon: Sherren Kerns, MD;  Location: [facility] OR;  Service: Vascular;  Laterality: Right;  . INTRAOPERATIVE ARTERIOGRAM Right 10/25/2016   Procedure: INTRA OPERATIVE ARTERIOGRAM;  Surgeon: Sherren Kerns, MD;  Location: [facility] OR;  Service: Vascular;  Laterality: Right;  . TEE WITHOUT CARDIOVERSION N/A 02/06/2016     Procedure: TRANSESOPHAGEAL ECHOCARDIOGRAM (TEE);  Surgeon: Wendall Stade, MD;  Location: [facility] ENDOSCOPY;  Service: Cardiovascular;  Laterality: N/A;    Allergies  Allergen Reactions  . No Known Allergies     Prior to Admission medications   Medication Sig Start Date End Date Taking? Authorizing Provider  aspirin 81 MG chewable tablet Chew 1 tablet (81 mg total) by mouth daily. 05/22/16   Emi Holes, PA-C  atorvastatin (LIPITOR) 40 MG tablet Take 1 tablet (40 mg total) by mouth daily at 6 PM. 11/01/16   Mcarthur Rossetti Angiulli, PA-C  HYDROcodone-acetaminophen (NORCO/VICODIN) 5-325 MG tablet Take 1-2 tablets by mouth every 4 (four) hours as needed for moderate pain. 11/01/16   Charlton Amor, PA-C    Social History   Social History  . Marital status: Single    Spouse name: N/A  . Number of children: N/A  . Years of education: N/A   Occupational History  . Curator    Social History Main Topics  . Smoking status: Current Every Day Smoker    Packs/day: 1.00    Years: 40.00    Types: Cigarettes  . Smokeless tobacco: Never Used  . Alcohol use 0.0 oz/week     Comment: 6 pack/day  . Drug use: Yes    Types: Marijuana     Comment: occasional marijuana  . Sexual activity: Not on file   Other Topics Concern  . Not on file   Social History Narrative   Lives alone  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     No family history on file.  ROS:  Only c/o right toe pain  Physical Examination  Vitals:   11/07/16 1000 11/07/16 1015  BP: 135/81 139/85  Pulse: 64 69  Resp: 13 19  Temp:     Body mass index is 32.5 kg/m.  General:  WDWN in NAD  HENT: WNL, normocephalic Pulmonary: normal non-labored breathing Cardiac: strong pt signal on right, monophasic dp Abdomen: soft, NT/ND, no masses Extremities:  Incisions are cdi and healing well, right first toe with blistering, 2nd toe has distal ischemic discoloration Neurologic: A&O X 3; Appropriate Affect ; SENSATION: normal; MOTOR  FUNCTION:  moving all extremities equally. Speech is fluent/normal Psychiatric:  Flat affect at basleline   CBC    Component Value Date/Time   WBC 8.9 11/07/2016 0920   RBC 4.59 11/07/2016 0920   HGB 14.4 11/07/2016 0920   HCT 42.1 11/07/2016 0920   PLT 228 11/07/2016 0920   MCV 91.7 11/07/2016 0920   MCH 31.4 11/07/2016 0920   MCHC 34.2 11/07/2016 0920   RDW 13.5 11/07/2016 0920   LYMPHSABS 1.9 11/07/2016 0920   MONOABS 0.4 11/07/2016 0920   EOSABS 0.2 11/07/2016 0920   BASOSABS 0.0 11/07/2016 0920    BMET    Component Value Date/Time   NA 139 11/07/2016 0920   NA 136 (A) 02/04/2016   K 3.8 11/07/2016 0920   CL 104 11/07/2016 0920   CO2 24 11/07/2016 0920   GLUCOSE 115 (H) 11/07/2016 0920   BUN 8 11/07/2016 0920   BUN 15 02/04/2016   CREATININE 0.66 11/07/2016 0920   CALCIUM 9.4 11/07/2016 0920   GFRNONAA >60 11/07/2016 0920   GFRAA >60 11/07/2016 0920    COAGS: Lab Results  Component Value Date   INR 1.05 11/07/2016   INR 1.04 10/25/2016   INR 1.01 02/04/2016     Non-Invasive Vascular Imaging:   ------------------------------------------------------------------- Summary: Duplex imaging reveals significant hematoma throughout the thigh and popliteal areas. Unable to adequately visualize bypass secondary to hematoma and patient&'s constant jerking leg movement. There is flow noted in the proximal posterior tibial artery. There is flow noted in the distal thigh area, but unable to adequately visualize if it is vessel or bypass secondary to movement and hematoma, however the flow is extremely dampened.  Arterial pressure indices:  +-----------------+---------+--------------+----------------------+ !Location         !Pressure !Brachial index!Waveform              ! +-----------------+---------+--------------+----------------------+ !Right ant tibial !57 mm Hg !0.39          !Severely dampened     ! !                 !         !              !monophasic             ! +-----------------+---------+--------------+----------------------+ !Right post tibial!129 mm Hg!0.88          !Biphasic              ! +-----------------+---------+--------------+----------------------+ !Left ant tibial  !167 mm Hg!1.14          !Biphasic              ! +-----------------+---------+--------------+----------------------+ !Left post tibial !177 mm Hg!1.21          !Biphasic              ! +-----------------+---------+--------------+----------------------+  -------------------------------------------------------------------  Summary: Right ABI indicates mild reduction in arterial flow. Left ABI indicates normal arterial flow.  ASSESSMENT/PLAN: This is a 56 y.o. male s/p right femoral to tp trunk bypass with nrgsv. He has progressive pain in his first two toes on the bypass side. I offered admission for consideration of amputation but he does not want this at the time although he acknowledges he might eventually require toes to be amputated. rec local wound care with betadine paint and protecting his foot. Counseled on smoking cessation. He can keep regularly scheduled f/u with Dr. Darrick Penna this Thursday.  Tamiki Kuba C. Randie Heinz, MD [facility] Office: [phone] Pager: [phone]

## 2016-11-07 NOTE — ED Triage Notes (Addendum)
Pt arrives via [facility] for c/o right foot pain x2 weeks. Pt reports he had vascular surgery on right leg 3-4 weeks ago. Right foot swollen and red. Hx of CVA with right sided deficits.

## 2016-11-07 NOTE — ED Notes (Signed)
Patient transported to Ultrasound 

## 2016-11-07 NOTE — Progress Notes (Addendum)
VASCULAR LAB PRELIMINARY  ARTERIAL  ABI completed:Right ABI indicates mild reduction in arterial flow. Left ABI indicates normal arterial flow. Duplex imaging reveals significant hematoma throughout the thigh and popliteal areas.  Unable to adequately visualize bypass secondary to hematoma and patient's constant leg movement.  There is flow noted in the proximal posterior tibial artery.  There is flow noted in the distal thigh area, but I am unable to adequately visualize if it is vessel or bypass secondary to movement and hematoma, however the flow is extremely dampened.    RIGHT    LEFT    PRESSURE WAVEFORM  PRESSURE WAVEFORM  BRACHIAL 145 T BRACHIAL 146 T  DP   DP    AT 57 SDM AT 167 B  PT 129 B PT 177 B  PER   PER    GREAT TOE  NA GREAT TOE  NA    RIGHT LEFT  ABI 0.88 1.2    Called report to Alona BeneJoshua Long, MD  Rosezetta SchlatterKANADY, [facility]Jera Headings, RVT 11/07/2016, 11:31 AM

## 2016-11-07 NOTE — ED Provider Notes (Signed)
Emergency Department Provider Note   I have reviewed the triage vital signs and the nursing notes.   HISTORY  Chief Complaint Foot Pain   HPI Ronald Forbes is a 56 y.o. male with PMH of CVA, EtOH, DM, HLD, and PAD with recent right femoral to tibioperoneal trunk bypass on 10/25/16 with Dr. Darrick PennaFields. The patient states that he's had progressively worsening pain in the right foot since proximally 5 days after the procedure. He states initially he had decreased pain in the foot but the pain returned and has gradually worsened. He spent a brief time in rehabilitation but is currently at home. He has not had an office follow-up visit since the procedure. At this time his pain is severe, constant, and radiating through the leg but mostly painful in the foot. No alleviating factors.    Past Medical History:  Diagnosis Date  . Alcohol use disorder (HCC)   . Cerebral vascular accident (HCC)   . 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 Diabetes mellitus type 2 in obese (HCC)   . HLD (hyperlipidemia)   . Morbid obesity (HCC)   . Stroke (HCC)   . Tobacco abuse     Patient Active Problem List   Diagnosis Date Noted  . Reactive hypertension   . Type 2 diabetes mellitus with peripheral neuropathy (HCC)   . Benign essential HTN   . Hypokalemia   . Hypoalbuminemia due to protein-calorie malnutrition (HCC)   . Acute blood loss anemia   . Debilitated 10/28/2016  . Ischemia of right lower extremity 10/25/2016  . PAD (peripheral artery disease) (HCC) 10/20/2016  . Thyroid nodule 02/29/2016  . 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 Cerebrovascular accident (CVA) due to embolism of left posterior cerebral artery (HCC)   . Diabetes mellitus type 2 in obese (HCC) 02/07/2016  . HLD (hyperlipidemia)   . Tobacco abuse 02/04/2016  . Alcohol use disorder (HCC) 02/04/2016  . Morbid obesity (HCC) 02/04/2016  . CVA (cerebral vascular accident) (HCC)  02/04/2016    Past Surgical History:  Procedure Laterality Date  . ABDOMINAL AORTOGRAM W/LOWER EXTREMITY Right 10/20/2016   Procedure: Abdominal Aortogram w/Lower Extremity;  Surgeon: Maeola HarmanBrandon Christopher Cain, MD;  [facility];  Service: Cardiovascular;  Laterality: Right;  lower leg  . EP IMPLANTABLE DEVICE N/A 02/09/2016   Procedure: Loop Recorder Insertion;  Surgeon: Duke SalviaSteven C Klein, MD;  [facility];  Service: Cardiovascular;  Laterality: N/A;  . FEMORAL-TIBIAL BYPASS GRAFT Right 10/25/2016   Procedure: BYPASS GRAFT FEMORAL-TIBIAL ARTERY USING NON REVERSED SAPPHENOUS VEIN;  Surgeon: Sherren Kernsharles E Fields, MD;  [facility];  Service: Vascular;  Laterality: Right;  . INTRAOPERATIVE ARTERIOGRAM Right 10/25/2016   Procedure: INTRA OPERATIVE ARTERIOGRAM;  Surgeon: Sherren Kernsharles E Fields, MD;  [facility];  Service: Vascular;  Laterality: Right;  . TEE WITHOUT CARDIOVERSION N/A 02/06/2016   Procedure: TRANSESOPHAGEAL ECHOCARDIOGRAM (TEE);  Surgeon: Wendall StadePeter C Nishan, MD;  [facility];  Service: Cardiovascular;  Laterality: N/A;    Current Outpatient Rx  . Order #: [id number] Class: Print  . Order #: [id number] Class: Print  . Order #: [id number] Class: Print  . Order #: [id number] Class: Print    Allergies No known allergies  No family history on file.  Social History Social History  Substance Use Topics  . Smoking status: Current Every Day Smoker    Packs/day: 1.00    Years: 40.00    Types: Cigarettes  . Smokeless tobacco:  Never Used  . Alcohol use 0.0 oz/week     Comment: 6 pack/day    Review of Systems  Constitutional: No fever/chills Eyes: No visual changes. ENT: No sore throat. Cardiovascular: Denies chest pain. Respiratory: Denies shortness of breath. Gastrointestinal: No abdominal pain.  No nausea, no vomiting.  No diarrhea.  No constipation. Genitourinary: Negative for dysuria. Musculoskeletal: Negative for back pain. Positive right  leg/foot pain.  Skin: Negative for rash. Neurological: Negative for headaches, focal weakness or numbness.  10-point ROS otherwise negative.  ____________________________________________   PHYSICAL EXAM:  VITAL SIGNS: ED Triage Vitals  Enc Vitals Group     BP 11/07/16 0904 138/89     Pulse Rate 11/07/16 0904 72     Resp 11/07/16 0904 20     Temp 11/07/16 0904 98.4 F (36.9 C)     Temp Source 11/07/16 0904 Oral     SpO2 11/07/16 0904 100 %     Weight 11/07/16 0905 260 lb (117.9 kg)     Height 11/07/16 0905 6\' 3"  (1.905 m)     Pain Score 11/07/16 0903 8   Constitutional: Alert and oriented. Well appearing and in no acute distress. Eyes: Conjunctivae are normal.  Head: Atraumatic. Nose: No congestion/rhinnorhea. Mouth/Throat: Mucous membranes are moist.  Oropharynx non-erythematous. Neck: No stridor.  Cardiovascular: Normal rate, regular rhythm. Good peripheral circulation. Grossly normal heart sounds.   Respiratory: Normal respiratory effort.  No retractions. Lungs CTAB. Gastrointestinal: Soft and nontender. No distention.  Musculoskeletal: Tenderness and edema of the right foot and lower leg. No gross deformities of extremities. Neurologic:  Normal speech and language. No gross focal neurologic deficits are appreciated.  Skin:  Skin is warm, dry and intact. Dark area around the 1st -3rd toes. No ulceration. No area of fluctuance.  Psychiatric: Mood and affect are normal. Speech and behavior are normal.  ____________________________________________   LABS (all labs ordered are listed, but only abnormal results are displayed)  Labs Reviewed  COMPREHENSIVE METABOLIC PANEL - Abnormal; Notable for the following:       Result Value   Glucose, Bld 115 (*)    All other components within normal limits  CBC WITH DIFFERENTIAL/PLATELET  PROTIME-INR   ____________________________________________  RADIOLOGY  ARTERIAL  ABI completed:Right ABI indicates mild reduction in  arterial flow. Left ABI indicates normal arterial flow. Duplex imaging reveals significant hematoma throughout the thigh and popliteal areas.  Unable to adequately visualize bypass secondary to hematoma and patient's constant leg movement.  There is flow noted in the proximal posterior tibial artery.  There is flow noted in the distal thigh area, but I am unable to adequately visualize if it is vessel or bypass secondary to movement and hematoma, however the flow is extremely dampened.    RIGHT    LEFT    PRESSURE WAVEFORM  PRESSURE WAVEFORM  BRACHIAL 145 T BRACHIAL 146 T  DP   DP    AT 57 SDM AT 167 B  PT 129 B PT 177 B  PER   PER    GREAT TOE  NA GREAT TOE  NA    RIGHT LEFT  ABI 0.88 1.2    Called report to Alona Bene, MD  Rosezetta Schlatter, [facility], RVT 11/07/2016, 11:31 AM  ____________________________________________   PROCEDURES  Procedure(s) performed:   Procedures  None ____________________________________________   INITIAL IMPRESSION / ASSESSMENT AND PLAN / ED COURSE  Pertinent labs & imaging results that were available during my care of the patient were reviewed by me and  considered in my medical decision making (see chart for details).  Patient resents to the emergency department for evaluation of right foot pain and swelling in the setting of recent femoral-popliteal bypass. Patient has significant tenderness to the foot and lower leg. Unable to obtain strong Doppler signal over the DP and PT on the right. Patient reports that erythema around the toes is unchanged. Plan for baseline labs, pain medication, vascular surgery consult.  09:20 AM Spoke with Vascular surgery Dr. Randie Heinz to discuss the case. Will order venous duplex of the graft on the right and ABI of the right leg.  11:45 AM Called by vascular ultrasound. Patient has normal ABI. He has a very large hematoma in his thigh and leg that is obscuring visualization of the graft. Suspect that  the large hematoma may be contributing to his pain.   Spoke with Dr. Randie Heinz who recommends follow up with them in clinic. May need toe amputation ultimately. Discussed this with patient who is in agreement with plan for pain control at home. Discussed return precautions in detail.   At this time, I do not feel there is any life-threatening condition present. I have reviewed and discussed all results (EKG, imaging, lab, urine as appropriate), exam findings with patient. I have reviewed nursing notes and appropriate previous records.  I feel the patient is safe to be discharged home without further emergent workup. Discussed usual and customary return precautions. Patient and family (if present) verbalize understanding and are comfortable with this plan.  Patient will follow-up with their primary care provider. If they do not have a primary care provider, information for follow-up has been provided to them. All questions have been answered.  ____________________________________________  FINAL CLINICAL IMPRESSION(S) / ED DIAGNOSES  Final diagnoses:  Foot pain, right  Hematoma     MEDICATIONS GIVEN DURING THIS VISIT:  Medications  morphine 4 MG/ML injection 4 mg (4 mg Intravenous Given 11/07/16 0919)  oxyCODONE-acetaminophen (PERCOCET/ROXICET) 5-325 MG per tablet 2 tablet (2 tablets Oral Given 11/07/16 1345)     NEW OUTPATIENT MEDICATIONS STARTED DURING THIS VISIT:  Discharge Medication List as of 11/07/2016  2:34 PM    START taking these medications   Details  oxyCODONE-acetaminophen (PERCOCET/ROXICET) 5-325 MG tablet Take 1-2 tablets by mouth every 6 (six) hours as needed for severe pain., Starting Sun 11/07/2016, Print          Note:  This document was prepared using Dragon voice recognition software and may include unintentional dictation errors.  Alona Bene, MD Emergency Medicine   Maia Plan, MD 11/07/16 [phone]

## 2016-11-07 NOTE — ED Notes (Signed)
Pt given crackers after verbal ok by Dr. Randie Heinzain.

## 2016-11-07 NOTE — ED Notes (Signed)
ptar at bedside, all paperwork given to PTAR at discharge.

## 2016-11-07 NOTE — ED Notes (Signed)
Papers reviewed with patient and he verbalizes understanding and intent to follow up. He reports decreased pain. IV removed

## 2016-11-07 NOTE — Discharge Instructions (Signed)
You were seen in the ED today with pain in the right foot and leg. The blood vessels in the leg are open. You will need to see the vascular surgeon next week to discuss further options for the foot. I have prescribed a small amount of pain medication for use at home until your appointment with vascular surgery.

## 2016-11-07 NOTE — ED Notes (Signed)
ED Provider at bedside. 

## 2016-11-10 ENCOUNTER — Encounter (HOSPITAL_COMMUNITY): Payer: Self-pay

## 2016-11-10 ENCOUNTER — Inpatient Hospital Stay (HOSPITAL_COMMUNITY)
Admission: EM | Admit: 2016-11-10 | Discharge: 2016-11-17 | DRG: 240 | Disposition: A | Discharge status: Rehabilitation Facility | Payer: Medicaid Other | Attending: Vascular Surgery | Admitting: Vascular Surgery

## 2016-11-10 DIAGNOSIS — I739 Peripheral vascular disease, unspecified: Secondary | ICD-10-CM | POA: Diagnosis present

## 2016-11-10 DIAGNOSIS — Z89611 Acquired absence of right leg above knee: Secondary | ICD-10-CM | POA: Diagnosis not present

## 2016-11-10 DIAGNOSIS — R509 Fever, unspecified: Secondary | ICD-10-CM

## 2016-11-10 DIAGNOSIS — E1142 Type 2 diabetes mellitus with diabetic polyneuropathy: Secondary | ICD-10-CM | POA: Diagnosis not present

## 2016-11-10 DIAGNOSIS — Z7982 Long term (current) use of aspirin: Secondary | ICD-10-CM

## 2016-11-10 DIAGNOSIS — M79671 Pain in right foot: Secondary | ICD-10-CM | POA: Diagnosis present

## 2016-11-10 DIAGNOSIS — F17210 Nicotine dependence, cigarettes, uncomplicated: Secondary | ICD-10-CM | POA: Diagnosis present

## 2016-11-10 DIAGNOSIS — Z79899 Other long term (current) drug therapy: Secondary | ICD-10-CM | POA: Diagnosis not present

## 2016-11-10 DIAGNOSIS — Z8673 Personal history of transient ischemic attack (TIA), and cerebral infarction without residual deficits: Secondary | ICD-10-CM | POA: Diagnosis not present

## 2016-11-10 DIAGNOSIS — T82392A Other mechanical complication of femoral arterial graft (bypass), initial encounter: Principal | ICD-10-CM | POA: Diagnosis present

## 2016-11-10 DIAGNOSIS — E1152 Type 2 diabetes mellitus with diabetic peripheral angiopathy with gangrene: Secondary | ICD-10-CM | POA: Diagnosis present

## 2016-11-10 DIAGNOSIS — I70269 Atherosclerosis of native arteries of extremities with gangrene, unspecified extremity: Secondary | ICD-10-CM | POA: Diagnosis not present

## 2016-11-10 DIAGNOSIS — I6930 Unspecified sequelae of cerebral infarction: Secondary | ICD-10-CM | POA: Diagnosis not present

## 2016-11-10 DIAGNOSIS — I10 Essential (primary) hypertension: Secondary | ICD-10-CM | POA: Diagnosis present

## 2016-11-10 DIAGNOSIS — G546 Phantom limb syndrome with pain: Secondary | ICD-10-CM | POA: Diagnosis not present

## 2016-11-10 DIAGNOSIS — I69359 Hemiplegia and hemiparesis following cerebral infarction affecting unspecified side: Secondary | ICD-10-CM | POA: Diagnosis not present

## 2016-11-10 DIAGNOSIS — G8111 Spastic hemiplegia affecting right dominant side: Secondary | ICD-10-CM | POA: Diagnosis not present

## 2016-11-10 DIAGNOSIS — I639 Cerebral infarction, unspecified: Secondary | ICD-10-CM | POA: Diagnosis not present

## 2016-11-10 DIAGNOSIS — I96 Gangrene, not elsewhere classified: Secondary | ICD-10-CM

## 2016-11-10 DIAGNOSIS — Y848 Other medical procedures as the cause of abnormal reaction of the patient, or of later complication, without mention of misadventure at the time of the procedure: Secondary | ICD-10-CM | POA: Diagnosis present

## 2016-11-10 DIAGNOSIS — D62 Acute posthemorrhagic anemia: Secondary | ICD-10-CM | POA: Diagnosis not present

## 2016-11-10 DIAGNOSIS — E119 Type 2 diabetes mellitus without complications: Secondary | ICD-10-CM

## 2016-11-10 DIAGNOSIS — E785 Hyperlipidemia, unspecified: Secondary | ICD-10-CM | POA: Diagnosis present

## 2016-11-10 DIAGNOSIS — S78111A Complete traumatic amputation at level between right hip and knee, initial encounter: Secondary | ICD-10-CM

## 2016-11-10 DIAGNOSIS — Z6830 Body mass index (BMI) 30.0-30.9, adult: Secondary | ICD-10-CM | POA: Diagnosis not present

## 2016-11-10 DIAGNOSIS — E1151 Type 2 diabetes mellitus with diabetic peripheral angiopathy without gangrene: Secondary | ICD-10-CM | POA: Diagnosis not present

## 2016-11-10 DIAGNOSIS — T82858A Stenosis of vascular prosthetic devices, implants and grafts, initial encounter: Secondary | ICD-10-CM | POA: Diagnosis not present

## 2016-11-10 LAB — CBC WITH DIFFERENTIAL/PLATELET
Basophils Absolute: 0 10*3/uL (ref 0.0–0.1)
Basophils Relative: 0 %
Eosinophils Absolute: 0.1 10*3/uL (ref 0.0–0.7)
Eosinophils Relative: 2 %
HCT: 38.7 % — ABNORMAL LOW (ref 39.0–52.0)
Hemoglobin: 13.2 g/dL (ref 13.0–17.0)
Lymphocytes Relative: 19 %
Lymphs Abs: 1.7 10*3/uL (ref 0.7–4.0)
MCH: 30.8 pg (ref 26.0–34.0)
MCHC: 34.1 g/dL (ref 30.0–36.0)
MCV: 90.2 fL (ref 78.0–100.0)
Monocytes Absolute: 0.5 10*3/uL (ref 0.1–1.0)
Monocytes Relative: 6 %
Neutro Abs: 6.3 10*3/uL (ref 1.7–7.7)
Neutrophils Relative %: 73 %
Platelets: 173 10*3/uL (ref 150–400)
RBC: 4.29 MIL/uL (ref 4.22–5.81)
RDW: 13.4 % (ref 11.5–15.5)
WBC: 8.6 10*3/uL (ref 4.0–10.5)

## 2016-11-10 LAB — BASIC METABOLIC PANEL
Anion gap: 9 (ref 5–15)
BUN: 5 mg/dL — ABNORMAL LOW (ref 6–20)
CO2: 23 mmol/L (ref 22–32)
Calcium: 8.9 mg/dL (ref 8.9–10.3)
Chloride: 106 mmol/L (ref 101–111)
Creatinine, Ser: 0.63 mg/dL (ref 0.61–1.24)
GFR calc Af Amer: >60 mL/min (ref >60)
GFR calc non Af Amer: >60 mL/min (ref >60)
Glucose, Bld: 111 mg/dL — ABNORMAL HIGH (ref 65–99)
Potassium: 3.7 mmol/L (ref 3.5–5.1)
Sodium: 138 mmol/L (ref 135–145)

## 2016-11-10 MED ORDER — MORPHINE SULFATE (PF) 4 MG/ML IV SOLN
2.0000 mg | INTRAVENOUS | Status: DC | PRN
  Administered 2016-11-10 – 2016-11-12 (×6): 4 mg via INTRAVENOUS
  Administered 2016-11-12: 5 mg via INTRAVENOUS
  Filled 2016-11-10 (×7): qty 1
  Filled 2016-11-10: qty 2

## 2016-11-10 MED ORDER — POTASSIUM CHLORIDE CRYS ER 20 MEQ PO TBCR
20.0000 meq | EXTENDED_RELEASE_TABLET | Freq: Once | ORAL | Status: AC
  Administered 2016-11-10: 20 meq via ORAL
  Filled 2016-11-10: qty 1

## 2016-11-10 MED ORDER — MORPHINE SULFATE (PF) 4 MG/ML IV SOLN
4.0000 mg | Freq: Once | INTRAVENOUS | Status: AC
  Administered 2016-11-10: 4 mg via INTRAVENOUS
  Filled 2016-11-10: qty 1

## 2016-11-10 MED ORDER — DOCUSATE SODIUM 100 MG PO CAPS
100.0000 mg | ORAL_CAPSULE | Freq: Two times a day (BID) | ORAL | Status: DC
  Administered 2016-11-10 – 2016-11-17 (×14): 100 mg via ORAL
  Filled 2016-11-10 (×14): qty 1

## 2016-11-10 MED ORDER — ACETAMINOPHEN 325 MG RE SUPP: 325.0000 mg | RECTAL | Status: DC | PRN

## 2016-11-10 MED ORDER — PHENOL 1.4 % MT LIQD
1.0000 | OROMUCOSAL | Status: DC | PRN
  Filled 2016-11-10: qty 177

## 2016-11-10 MED ORDER — GUAIFENESIN-DM 100-10 MG/5ML PO SYRP: 15.0000 mL | ORAL_SOLUTION | ORAL | Status: DC | PRN

## 2016-11-10 MED ORDER — HYDRALAZINE HCL 20 MG/ML IJ SOLN: 5.0000 mg | INTRAMUSCULAR | Status: DC | PRN

## 2016-11-10 MED ORDER — SODIUM CHLORIDE 0.9% FLUSH
3.0000 mL | INTRAVENOUS | Status: DC | PRN
  Administered 2016-11-14: 3 mL via INTRAVENOUS
  Filled 2016-11-10: qty 3

## 2016-11-10 MED ORDER — SODIUM CHLORIDE 0.9% FLUSH
3.0000 mL | Freq: Two times a day (BID) | INTRAVENOUS | Status: DC
  Administered 2016-11-10 – 2016-11-16 (×5): 3 mL via INTRAVENOUS

## 2016-11-10 MED ORDER — PANTOPRAZOLE SODIUM 40 MG PO TBEC
40.0000 mg | DELAYED_RELEASE_TABLET | Freq: Every day | ORAL | Status: DC
  Administered 2016-11-12 – 2016-11-16 (×5): 40 mg via ORAL
  Filled 2016-11-10 (×7): qty 1

## 2016-11-10 MED ORDER — ACETAMINOPHEN 325 MG PO TABS
325.0000 mg | ORAL_TABLET | ORAL | Status: DC | PRN
  Administered 2016-11-16: 650 mg via ORAL
  Filled 2016-11-10: qty 2

## 2016-11-10 MED ORDER — HEPARIN SODIUM (PORCINE) 5000 UNIT/ML IJ SOLN
5000.0000 [IU] | Freq: Three times a day (TID) | INTRAMUSCULAR | Status: DC
  Filled 2016-11-10 (×4): qty 1

## 2016-11-10 MED ORDER — SODIUM CHLORIDE 0.9 % IV SOLN
250.0000 mL | INTRAVENOUS | Status: DC | PRN
  Administered 2016-11-15: 250 mL via INTRAVENOUS

## 2016-11-10 MED ORDER — ALUM & MAG HYDROXIDE-SIMETH 200-200-20 MG/5ML PO SUSP: 15.0000 mL | ORAL | Status: DC | PRN

## 2016-11-10 MED ORDER — ONDANSETRON HCL 4 MG/2ML IJ SOLN: 4.0000 mg | Freq: Four times a day (QID) | INTRAMUSCULAR | Status: DC | PRN

## 2016-11-10 MED ORDER — LABETALOL HCL 5 MG/ML IV SOLN: 10.0000 mg | INTRAVENOUS | Status: DC | PRN

## 2016-11-10 MED ORDER — OXYCODONE-ACETAMINOPHEN 5-325 MG PO TABS
1.0000 | ORAL_TABLET | ORAL | Status: DC | PRN
  Administered 2016-11-10 (×2): 2 via ORAL
  Filled 2016-11-10 (×3): qty 2

## 2016-11-10 MED ORDER — METOPROLOL TARTRATE 5 MG/5ML IV SOLN
2.0000 mg | INTRAVENOUS | Status: DC | PRN
Start: 2016-11-10 — End: 2016-11-17

## 2016-11-10 NOTE — H&P (Signed)
Referring Physician: Caroline  Patient name: Ronald Forbes MRN: [id number] DOB: [birth year] Sex: male  REASON FOR CONSULT: right foot pain  HPI: Ronald Forbes is a 56 y.o. male s/p right fem pt bypass 10 days ago.  Complains of several day history of pain in right foot and swelling. Duplex 3 days ago showed ABI 0.88 but difficult to see bypass.  His pain is now worse.   Other medical problems include Diabetes, hyperlipidemia, obesity, tobacco abuse all currently controlled.  Past Medical History:  Diagnosis Date  . Alcohol use disorder (HCC)   . Cerebral vascular accident (HCC)   . 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 Diabetes mellitus type 2 in obese (HCC)   . HLD (hyperlipidemia)   . Morbid obesity (HCC)   . Stroke (HCC)   . Tobacco abuse    Past Surgical History:  Procedure Laterality Date  . ABDOMINAL AORTOGRAM W/LOWER EXTREMITY Right 10/20/2016   Procedure: Abdominal Aortogram w/Lower Extremity;  Surgeon: Maeola Harman, MD;  [facility] INVAS[facility] CV LAB;  Service: Cardiovascular;  Laterality: Right;  lower leg  . EP IMPLANTABLE DEVICE N/A 02/09/2016   Procedure: Loop Recorder Insertion;  Surgeon: Duke Salvia, MD;  Location: [facility] INVASIVE CV LAB;  Service: Cardiovascular;  Laterality: N/A;  . FEMORAL-TIBIAL BYPASS GRAFT Right 10/25/2016   Procedure: BYPASS GRAFT FEMORAL-TIBIAL ARTERY USING NON REVERSED SAPPHENOUS VEIN;  Surgeon: Sherren Kerns, MD;  Location: [facility] OR;  Service: Vascular;  Laterality: Right;  . INTRAOPERATIVE ARTERIOGRAM Right 10/25/2016   Procedure: INTRA OPERATIVE ARTERIOGRAM;  Surgeon: Sherren Kerns, MD;  Location: [facility] OR;  Service: Vascular;  Laterality: Right;  . TEE WITHOUT CARDIOVERSION N/A 02/06/2016   Procedure: TRANSESOPHAGEAL ECHOCARDIOGRAM (TEE);  Surgeon: Wendall Stade, MD;  Location: [facility] ENDOSCOPY;  Service: Cardiovascular;  Laterality: N/A;    History reviewed. No pertinent family  history.  SOCIAL HISTORY: Social History   Social History  . Marital status: Single    Spouse name: N/A  . Number of children: N/A  . Years of education: N/A   Occupational History  . Curator    Social History Main Topics  . Smoking status: Current Every Day Smoker    Packs/day: 1.00    Years: 40.00    Types: Cigarettes  . Smokeless tobacco: Never Used  . Alcohol use 0.0 oz/week     Comment: 6 pack/day  . Drug use: Yes    Types: Marijuana     Comment: occasional marijuana  . Sexual activity: Not on file   Other Topics Concern  . Not on file   Social History Narrative   Lives alone   Used to work as a Curator    Allergies  Allergen Reactions  . No Known Allergies     No current facility-administered medications for this encounter.    Current Outpatient Prescriptions  Medication Sig Dispense Refill  . oxyCODONE-acetaminophen (PERCOCET/ROXICET) 5-325 MG tablet Take 1-2 tablets by mouth every 6 (six) hours as needed for severe pain. 15 tablet 0  . aspirin 81 MG chewable tablet Chew 1 tablet (81 mg total) by mouth daily. (Patient not taking: Reported on 11/10/2016) 30 tablet 0  . atorvastatin (LIPITOR) 40 MG tablet Take 1 tablet (40 mg total) by mouth daily at 6 PM. (Patient not taking: Reported on 11/10/2016) 30 tablet 0  . HYDROcodone-acetaminophen (NORCO/VICODIN) 5-325 MG tablet Take 1-2 tablets by mouth every 4 (four) hours as needed for  moderate pain. (Patient not taking: Reported on 11/10/2016) 30 tablet 0    ROS:   General:  No weight loss, Fever, chills  HEENT: No recent headaches, no nasal bleeding, no visual changes, no sore throat  Neurologic: No dizziness, blackouts, seizures. No recent symptoms of stroke or mini- stroke. No recent episodes of slurred speech, or temporary blindness.  Cardiac: No recent episodes of chest pain/pressure, no shortness of breath at rest.  + shortness of breath with exertion.  Denies history of atrial fibrillation or  irregular heartbeat  Vascular: + history of rest pain in feet.  No history of claudication.  No history of non-healing ulcer, No history of DVT   Pulmonary: No home oxygen, no productive cough, no hemoptysis,  No asthma or wheezing  Musculoskeletal:  [ ]  Arthritis, [ ]  Low back pain,  [ ]  Joint pain  Hematologic:No history of hypercoagulable state.  No history of easy bleeding.  No history of anemia  Gastrointestinal: No hematochezia or melena,  No gastroesophageal reflux, no trouble swallowing  Urinary: [ ]  chronic Kidney disease, [ ]  on HD - [ ]  MWF or [ ]  TTHS, [ ]  Burning with urination, [ ]  Frequent urination, [ ]  Difficulty urinating;   Skin: No rashes  Psychological: No history of anxiety,  No history of depression   Physical Examination  Vitals:   11/10/16 1130 11/10/16 1200 11/10/16 1230 11/10/16 1300  BP: 140/90 (!) 155/97 (!) 152/92 (!) 146/81  Pulse: 75 71 72 69  Resp:  19 20 (!) 21  Temp:      TempSrc:      SpO2: 98% 98% 98% 99%  Weight:      Height:        Body mass index is 30 kg/m.  General:  Alert and oriented, no acute distress HEENT: Normal Neck: No bruit or JVD Pulmonary: Clear to auscultation bilaterally Cardiac: Regular Rate and Rhythm without murmur Abdomen: Soft, non-tender, non-distended, no mass, no scars Skin: No rash Extremity Pulses:  2+ radial, brachial, femoral, dorsalis pedis right leg, right 2+ fem, no audible doppler flow right foot Musculoskeletal: No deformity trace edema right leg diffusely.Right foot cool dusky, cool to mid leg, blistering of toes one and two  Neurologic: Upper and lower extremity motor 5/5 but right leg subtle weakness from prior stroke  DATA:   CBC    Component Value Date/Time   WBC 8.6 11/10/2016 1028   RBC 4.29 11/10/2016 1028   HGB 13.2 11/10/2016 1028   HCT 38.7 (L) 11/10/2016 1028   PLT 173 11/10/2016 1028   MCV 90.2 11/10/2016 1028   MCH 30.8 11/10/2016 1028   MCHC 34.1 11/10/2016 1028   RDW  13.4 11/10/2016 1028   LYMPHSABS 1.7 11/10/2016 1028   MONOABS 0.5 11/10/2016 1028   EOSABS 0.1 11/10/2016 1028   BASOSABS 0.0 11/10/2016 1028    BMET    Component Value Date/Time   NA 138 11/10/2016 1028   NA 136 (A) 02/04/2016   K 3.7 11/10/2016 1028   CL 106 11/10/2016 1028   CO2 23 11/10/2016 1028   GLUCOSE 111 (H) 11/10/2016 1028   BUN 5 (L) 11/10/2016 1028   BUN 15 02/04/2016   CREATININE 0.63 11/10/2016 1028   CALCIUM 8.9 11/10/2016 1028   GFRNONAA >60 11/10/2016 1028   GFRAA >60 11/10/2016 1028      ASSESSMENT:  Occluded right leg bypass. I do not believe foot is salvageable.  Will admit for pain control.  Plan for  Right AkA on Monday   PLAN:  See above.   Fabienne Bruns, MD [facility] [place] Office: [phone] Pager: [phone]

## 2016-11-10 NOTE — ED Triage Notes (Signed)
Pt. Coming from home via PTAR for right foot pain. Pt. Here 3 days ago for the same. Pt. Given oxycodone, but ran out. Pt. Has appointment with vascular surgeon tomorrow. Pt. Flailing around the bed and hyperventilating. RN attempting to calm patient, but patient unable to slow breathing or sit still. Pt. Demanding pain medication at this time. Pt. Hx of vascular surgery to artery in right foot. As of 3 days ago artery and veins open.

## 2016-11-10 NOTE — ED Notes (Signed)
Attempted report 

## 2016-11-10 NOTE — ED Notes (Signed)
Pt. Calling out stating the bed won't let him get comfortable. RN attempted to reposition. Patient upset stating that no one has come to talk to him. RN reminded him that EDP has been to talk and examine him. Vascular surgeon to come see the patient. Pt. Still unable to sit still. Pt. Also c/o that morphine isn't doing anything for his pain. EDP made aware.

## 2016-11-10 NOTE — ED Notes (Signed)
Pt. Asking for food. Will wait for Dr. Darrick PennaFields to see. Pt. Notified.

## 2016-11-10 NOTE — ED Notes (Signed)
Vascular surgery at bedside.

## 2016-11-10 NOTE — ED Notes (Signed)
Pt. Transported to unit by tech on telemetry monitor. Silver cane transported with patient.

## 2016-11-11 ENCOUNTER — Encounter: Payer: Medicaid Other | Admitting: Vascular Surgery

## 2016-11-11 MED ORDER — DIPHENHYDRAMINE HCL 12.5 MG/5ML PO ELIX
12.5000 mg | ORAL_SOLUTION | Freq: Four times a day (QID) | ORAL | Status: DC | PRN
  Administered 2016-11-14 – 2016-11-15 (×2): 12.5 mg via ORAL
  Filled 2016-11-11 (×2): qty 10

## 2016-11-11 MED ORDER — OXYCODONE-ACETAMINOPHEN 5-325 MG PO TABS: 1.0000 | ORAL_TABLET | ORAL | Status: DC | PRN

## 2016-11-11 MED ORDER — SODIUM CHLORIDE 0.9% FLUSH: 9.0000 mL | INTRAVENOUS | Status: DC | PRN

## 2016-11-11 MED ORDER — DIPHENHYDRAMINE HCL 50 MG/ML IJ SOLN: 12.5000 mg | Freq: Four times a day (QID) | INTRAMUSCULAR | Status: DC | PRN

## 2016-11-11 MED ORDER — ONDANSETRON HCL 4 MG/2ML IJ SOLN: 4.0000 mg | Freq: Four times a day (QID) | INTRAMUSCULAR | Status: DC | PRN

## 2016-11-11 MED ORDER — NALOXONE HCL 0.4 MG/ML IJ SOLN: 0.4000 mg | INTRAMUSCULAR | Status: DC | PRN

## 2016-11-11 MED ORDER — HYDROMORPHONE 1 MG/ML IV SOLN
INTRAVENOUS | Status: DC
  Administered 2016-11-11: 17:00:00 via INTRAVENOUS
  Administered 2016-11-11: 3 mg via INTRAVENOUS
  Administered 2016-11-12: 4.5 mg via INTRAVENOUS
  Administered 2016-11-12: 2.7 mg via INTRAVENOUS
  Administered 2016-11-12: 4.6 mg via INTRAVENOUS
  Administered 2016-11-12: 16:00:00 via INTRAVENOUS
  Administered 2016-11-12: 3 mg via INTRAVENOUS
  Administered 2016-11-12: 0 mg via INTRAVENOUS
  Administered 2016-11-12 – 2016-11-13 (×2): 3.6 mg via INTRAVENOUS
  Administered 2016-11-13: 1.7 mg via INTRAVENOUS
  Administered 2016-11-13: 4.8 mg via INTRAVENOUS
  Administered 2016-11-13: 0 mg via INTRAVENOUS
  Administered 2016-11-13: 1.7 mg via INTRAVENOUS
  Administered 2016-11-14: 2.4 mg via INTRAVENOUS
  Administered 2016-11-14: 2.7 mg via INTRAVENOUS
  Administered 2016-11-14 (×2): 3.3 mg via INTRAVENOUS
  Administered 2016-11-14: 25 mg via INTRAVENOUS
  Administered 2016-11-14: 4.8 mg via INTRAVENOUS
  Administered 2016-11-15 (×2): via INTRAVENOUS
  Administered 2016-11-15: 3 mg via INTRAVENOUS
  Administered 2016-11-15: 2.1 mg via INTRAVENOUS
  Administered 2016-11-15: 0.3 mg via INTRAVENOUS
  Administered 2016-11-16: 2.7 mg via INTRAVENOUS
  Administered 2016-11-16: 1.2 mg via INTRAVENOUS
  Filled 2016-11-11 (×5): qty 25

## 2016-11-11 MED ORDER — OXYCODONE HCL 5 MG PO TABS
5.0000 mg | ORAL_TABLET | ORAL | Status: DC | PRN
  Administered 2016-11-11 (×2): 10 mg via ORAL
  Filled 2016-11-11 (×2): qty 2

## 2016-11-11 MED ORDER — HYDROCODONE-ACETAMINOPHEN 5-325 MG PO TABS
1.0000 | ORAL_TABLET | ORAL | Status: DC | PRN
  Administered 2016-11-11 – 2016-11-17 (×23): 2 via ORAL
  Filled 2016-11-11 (×25): qty 2

## 2016-11-11 NOTE — Progress Notes (Signed)
Patient OK with taking morphine now.  Wants to make sure he can get it every hour because MD said he could. Pt resting with call bell within reach.  Will continue to monitor. Ronald Forbes, Donise Woodle McClintock, RN

## 2016-11-11 NOTE — Progress Notes (Addendum)
  [facility] Progress Note  Subjective  Right foot hurts. Says pain is ok.   Objective Vitals:   11/10/16 2259 11/11/16 0500  BP: (!) 143/88 (!) 153/87  Pulse: 69 74  Resp: 20 18  Temp: 97.9 F (36.6 C) 98 F (36.7 C)   No intake or output data in the 24 hours ending 11/11/16 0748  Right foot cool and dusky   Assessment/Planning: 56 y.o. male with occluded right leg bypass graft and nonsalvageable right foot  Continue current pain regimen. For right AKA Monday.   Raymond GurneyKimberly A Trinh 11/11/2016 7:48 AM -- Agree with above.  Pain control for now AKA Monday  Fabienne Brunsharles Terrius Gentile, MD [facility] [place] Office: [phone] Pager: [phone]  Laboratory CBC    Component Value Date/Time   WBC 8.6 11/10/2016 1028   HGB 13.2 11/10/2016 1028   HCT 38.7 (L) 11/10/2016 1028   PLT 173 11/10/2016 1028    BMET    Component Value Date/Time   NA 138 11/10/2016 1028   NA 136 (A) 02/04/2016   K 3.7 11/10/2016 1028   CL 106 11/10/2016 1028   CO2 23 11/10/2016 1028   GLUCOSE 111 (H) 11/10/2016 1028   BUN 5 (L) 11/10/2016 1028   BUN 15 02/04/2016   CREATININE 0.63 11/10/2016 1028   CALCIUM 8.9 11/10/2016 1028   GFRNONAA >60 11/10/2016 1028   GFRAA >60 11/10/2016 1028    COAG Lab Results  Component Value Date   INR 1.05 11/07/2016   INR 1.04 10/25/2016   INR 1.01 02/04/2016   No results found for: PTT  Antibiotics Anti-infectives    None       Maris BergerKimberly Trinh, PA-C [facility] Office: [phone] Pager: [phone] 11/11/2016 7:48 AM

## 2016-11-11 NOTE — Progress Notes (Signed)
Patient stated pain at shift pain but wanted to wait till seen by MD.  Pt did not want percocet or morphine. After seeing MD patient wanted pill for pain, I offered percocet and patient refused saying he would get another pill for pain.  I spoke with Selena BattenKim and she changed percocet to oxy IR. Given to patient and he states he can have every hour.  I explained that he could have morphine every hour but not oxy IR. Patient upset.  Pt resting with call bell within reach.  Will continue to monitor. Thomas HoffBurton, Resha Filippone McClintock, RN

## 2016-11-11 NOTE — Care Management Note (Signed)
Case Management Note  Patient Details  Name: Laverle HobbyKevin Iannuzzi MRN: [id number] Date of Birth: Jun 25, 1961  Subjective/Objective:  Pt admitted on 11/10/16 with Rt foot pain and swelling s/p RT fem pop BPG 10 days ago.  PTA, pt resided at home with brother.                     Action/Plan: Planning for RT AKA on Monday, 11/15/16.  Will follow for discharge planning postoperatively.    Expected Discharge Date:                  Expected Discharge Plan:  IP Rehab Facility  In-House Referral:     Discharge planning Services  CM Consult  Post Acute Care Choice:    Choice offered to:     DME Arranged:    DME Agency:     HH Arranged:    HH Agency:     Status of Service:  In process, will continue to follow  If discussed at Long Length of Stay Meetings, dates discussed:    Additional Comments:  Glennon Macmerson, Meagan Ancona M, RN 11/11/2016, 2:41 PM

## 2016-11-12 ENCOUNTER — Inpatient Hospital Stay (HOSPITAL_COMMUNITY): Payer: Medicaid Other

## 2016-11-12 MED ORDER — SENNA 8.6 MG PO TABS
1.0000 | ORAL_TABLET | Freq: Two times a day (BID) | ORAL | Status: DC | PRN
  Administered 2016-11-12 – 2016-11-14 (×3): 8.6 mg via ORAL
  Filled 2016-11-12 (×3): qty 1

## 2016-11-12 MED ORDER — BISACODYL 10 MG RE SUPP: 10.0000 mg | Freq: Every day | RECTAL | Status: DC | PRN

## 2016-11-12 MED ORDER — HYDROMORPHONE HCL 1 MG/ML IJ SOLN: 0.5000 mg | INTRAMUSCULAR | Status: DC | PRN

## 2016-11-12 MED ORDER — FLEET ENEMA 7-19 GM/118ML RE ENEM: 1.0000 | ENEMA | Freq: Every day | RECTAL | Status: DC | PRN

## 2016-11-12 NOTE — ED Provider Notes (Signed)
[facility]-EMERGENCY DEPT Provider Note   CSN: [id number] Arrival date & time: 11/10/16  1914     History   Chief Complaint Chief Complaint  Patient presents with  . Foot Pain    HPI Ronald Forbes is a 56 y.o. male.  HPI Patient presents to the emergency department with foot pain.  Patient states he had a recent femoral artery bypass.  The patient states that the wound is been slightly draining, but his feet are really bothering him since about a week ago.  She states that nothing seems make her condition better or worse.  He states he did not take any medications prior to arrival. The patient denies chest pain, shortness of breath, headache,blurred vision, neck pain, fever, cough, weakness, numbness, dizziness, anorexia, edema, abdominal pain, nausea, vomiting, diarrhea, rash, back pain, dysuria, hematemesis, bloody stool, near syncope, or syncope. Past Medical History:  Diagnosis Date  . Alcohol use disorder (HCC)   . Cerebral vascular accident (HCC)   . 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 Diabetes mellitus type 2 in obese (HCC)   . HLD (hyperlipidemia)   . Morbid obesity (HCC)   . Stroke (HCC)   . Tobacco abuse     Patient Active Problem List   Diagnosis Date Noted  . Reactive hypertension   . Type 2 diabetes mellitus with peripheral neuropathy (HCC)   . Benign essential HTN   . Hypokalemia   . Hypoalbuminemia due to protein-calorie malnutrition (HCC)   . Acute blood loss anemia   . Debilitated 10/28/2016  . Ischemia of right lower extremity 10/25/2016  . PAD (peripheral artery disease) (HCC) 10/20/2016  . Thyroid nodule 02/29/2016  . 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 Cerebrovascular accident (CVA) due to embolism of left posterior cerebral artery (HCC)   . Diabetes mellitus type 2 in obese (HCC) 02/07/2016  . HLD (hyperlipidemia)   . Tobacco abuse 02/04/2016  . Alcohol use disorder (HCC) 02/04/2016  . Morbid  obesity (HCC) 02/04/2016  . CVA (cerebral vascular accident) (HCC) 02/04/2016    Past Surgical History:  Procedure Laterality Date  . ABDOMINAL AORTOGRAM W/LOWER EXTREMITY Right 10/20/2016   Procedure: Abdominal Aortogram w/Lower Extremity;  Surgeon: Maeola Harman, MD;  Location: [facility] INVASIVE CV LAB;  Service: Cardiovascular;  Laterality: Right;  lower leg  . EP IMPLANTABLE DEVICE N/A 02/09/2016   Procedure: Loop Recorder Insertion;  Surgeon: Duke Salvia, MD;  Location: [facility] INVASIVE CV LAB;  Service: Cardiovascular;  Laterality: N/A;  . FEMORAL-TIBIAL BYPASS GRAFT Right 10/25/2016   Procedure: BYPASS GRAFT FEMORAL-TIBIAL ARTERY USING NON REVERSED SAPPHENOUS VEIN;  Surgeon: Sherren Kerns, MD;  Location: [facility] OR;  Service: Vascular;  Laterality: Right;  . INTRAOPERATIVE ARTERIOGRAM Right 10/25/2016   Procedure: INTRA OPERATIVE ARTERIOGRAM;  Surgeon: Sherren Kerns, MD;  Location: [facility] OR;  Service: Vascular;  Laterality: Right;  . TEE WITHOUT CARDIOVERSION N/A 02/06/2016   Procedure: TRANSESOPHAGEAL ECHOCARDIOGRAM (TEE);  Surgeon: Wendall Stade, MD;  Location: [facility] ENDOSCOPY;  Service: Cardiovascular;  Laterality: N/A;       Home Medications    Prior to Admission medications   Medication Sig Start Date End Date Taking? Authorizing Provider  oxyCODONE-acetaminophen (PERCOCET/ROXICET) 5-325 MG tablet Take 1-2 tablets by mouth every 6 (six) hours as needed for severe pain. 11/07/16  Yes Maia Plan, MD  aspirin 81 MG chewable tablet Chew 1 tablet (81 mg total) by mouth daily. Patient not taking: Reported on 11/10/2016 05/22/16  Alexandra M Law, PA-C  atorvastatin (LIPITOR) 40 MG tablet Take 1 tablet (40 mg total) by mouth daily at 6 PM. Patient not taking: Reported on 11/10/2016 11/01/16   Mcarthur Rossetti Angiulli, PA-C  HYDROcodone-acetaminophen (NORCO/VICODIN) 5-325 MG tablet Take 1-2 tablets by mouth every 4 (four) hours as needed for moderate pain. Patient not taking: Reported on 11/10/2016  11/01/16   Mcarthur Rossetti Angiulli, PA-C    Family History History reviewed. No pertinent family history.  Social History Social History  Substance Use Topics  . Smoking status: Former Smoker    Packs/day: 1.00    Years: 40.00    Types: Cigarettes    Quit date: 10/13/2016  . Smokeless tobacco: Never Used  . Alcohol use 0.0 oz/week     Comment: 6 pack/day     Allergies   No known allergies   Review of Systems Review of Systems All other systems negative except as documented in the HPI. All pertinent positives and negatives as reviewed in the HPI.   Physical Exam Updated Vital Signs BP 135/82 (BP Location: Left Arm)   Pulse 76   Temp 98.7 F (37.1 C) (Oral)   Resp 12   Ht  (1.905 m)   Wt 108.9 kg   SpO2 95%   BMI 30.00 kg/m   Physical Exam  Constitutional: He is oriented to person, place, and time. He appears well-developed and well-nourished. No distress.  HENT:  Head: Normocephalic and atraumatic.  Mouth/Throat: Oropharynx is clear and moist.  Eyes: Pupils are equal, round, and reactive to light.  Neck: Normal range of motion. Neck supple.  Cardiovascular: Normal rate, regular rhythm and normal heart sounds.  Exam reveals no gallop and no friction rub.   No murmur heard. Pulmonary/Chest: Effort normal and breath sounds normal. No respiratory distress. He has no wheezes.  Musculoskeletal:       Feet:  Neurological: He is alert and oriented to person, place, and time. He exhibits normal muscle tone. Coordination normal.  Skin: Skin is warm and dry. Capillary refill takes less than 2 seconds. No rash noted. No erythema.  Psychiatric: He has a normal mood and affect. His behavior is normal.  Nursing note and vitals reviewed.    ED Treatments / Results  Labs (all labs ordered are listed, but only abnormal results are displayed) Labs Reviewed  BASIC METABOLIC PANEL - Abnormal; Notable for the following:       Result Value   Glucose, Bld 111 (*)    BUN 5 (*)     All other components within normal limits  CBC WITH DIFFERENTIAL/PLATELET - Abnormal; Notable for the following:    HCT 38.7 (*)    All other components within normal limits    EKG  EKG Interpretation None       Radiology Dg Chest 2 View  Result Date: 11/12/2016 CLINICAL DATA:  Atherosclerotic disease with right foot gangrene EXAM: CHEST  2 VIEW COMPARISON:  None. FINDINGS: Heart size and vascularity normal. Lungs are clear without infiltrate effusion or mass. Normal lung volume. Cardiac loop recorder noted. IMPRESSION: No active cardiopulmonary disease. Electronically Signed   By: Marlan Palau M.D.   On: 11/12/2016 09:55    Procedures Procedures (including critical care time)  Medications Ordered in ED Medications  alum & mag hydroxide-simeth (MAALOX/MYLANTA) 200-200-20 MG/5ML suspension 15-30 mL (not administered)  pantoprazole (PROTONIX) EC tablet 40 mg (40 mg Oral Given 11/12/16 1008)  labetalol (NORMODYNE,TRANDATE) injection 10 mg (not administered)  hydrALAZINE (APRESOLINE) injection  5 mg (not administered)  metoprolol (LOPRESSOR) injection 2-5 mg (not administered)  guaiFENesin-dextromethorphan (ROBITUSSIN DM) 100-10 MG/5ML syrup 15 mL (not administered)  phenol (CHLORASEPTIC) mouth spray 1 spray (not administered)  heparin injection 5,000 Units (5,000 Units Subcutaneous Not Given 11/12/16 1400)  sodium chloride flush (NS) 0.9 % injection 3 mL (3 mLs Intravenous Not Given 11/12/16 1000)  sodium chloride flush (NS) 0.9 % injection 3 mL (not administered)  0.9 %  sodium chloride infusion (not administered)  acetaminophen (TYLENOL) tablet 325-650 mg (not administered)    Or  acetaminophen (TYLENOL) suppository 325-650 mg (not administered)  morphine 4 MG/ML injection 2-5 mg (4 mg Intravenous Given 11/11/16 1546)  docusate sodium (COLACE) capsule 100 mg (100 mg Oral Given 11/12/16 1008)  HYDROcodone-acetaminophen (NORCO/VICODIN) 5-325 MG per tablet 1-2 tablet (2 tablets  Oral Given 11/11/16 1439)  naloxone (NARCAN) injection 0.4 mg (not administered)    And  sodium chloride flush (NS) 0.9 % injection 9 mL (not administered)  ondansetron (ZOFRAN) injection 4 mg (not administered)  diphenhydrAMINE (BENADRYL) injection 12.5 mg (not administered)    Or  diphenhydrAMINE (BENADRYL) 12.5 MG/5ML elixir 12.5 mg (not administered)  HYDROmorphone (DILAUDID) 1 mg/mL PCA injection (2.7 mg Intravenous Received 11/12/16 1600)  HYDROmorphone (DILAUDID) injection 0.5 mg (not administered)  senna (SENOKOT) tablet 8.6 mg (8.6 mg Oral Given 11/12/16 1008)  bisacodyl (DULCOLAX) suppository 10 mg (not administered)  sodium phosphate (FLEET) 7-19 GM/118ML enema 1 enema (not administered)  morphine 4 MG/ML injection 4 mg (4 mg Intravenous Given 11/10/16 1051)  morphine 4 MG/ML injection 4 mg (4 mg Intravenous Given 11/10/16 1148)  potassium chloride SA (K-DUR,KLOR-CON) CR tablet 20-40 mEq (20 mEq Oral Given 11/10/16 1652)     Initial Impression / Assessment and Plan / ED Course  I have reviewed the triage vital signs and the nursing notes.  Pertinent labs & imaging results that were available during my care of the patient were reviewed by me and considered in my medical decision making (see chart for details).     I spoke with Dr. Darrick Penna, of vascular surgery.  The patient has seen for several bypass days.  Patient will need an amputation.  He has spoken with him previously about having an amputated.  He will be down to see the patient shortly  Final Clinical Impressions(s) / ED Diagnoses   Final diagnoses:  Gangrene of foot [facility])    New Prescriptions Current Discharge Medication List       Charlestine Night, PA-C 11/12/16 1631    Bethann Berkshire, MD 11/15/16 [phone]

## 2016-11-13 NOTE — Progress Notes (Signed)
   Replacement Daily Progress Note   Assessment/Planning: Ischemic R foot   Pt scheduled for Palliative R AKA on Monday with Dr. Darrick Penna  Comfort measures over the weekend  Preop for Monday's procedure  Subjective    c/o inadequate control of pain with PCA  Objective 98.3 F 134/91 83 16 96% (RA)  PULM  CTAB CV  RRR GI  soft, NTND VASC  R foot ischemic in appearance  Leonides Sake, MD, FACS [facility] [place] Office: [phone] Pager: [phone]  11/13/2016, 8:59 AM

## 2016-11-13 NOTE — Progress Notes (Signed)
Pt refused PCA pump during day shift and PCA disconnected.  Pt pain level not controlled with PRN pain medications.  RN educated pt that the PCA was the best way to be able to control his pain.  Pt agreed and PCA hooked back up to pt.

## 2016-11-13 NOTE — Progress Notes (Signed)
   Daily Progress Note   Assessment/Planning: R foot ischemia, occluded R fem-PT BPG   Pain better controlled now  R AKA Monday  Subjective    PCA helping with pain  Objective Vitals:   11/13/16 0407 11/13/16 0506 11/13/16 0732 11/13/16 0733  BP:  (!) 153/89    Pulse:  87    Resp: Temp:  98.3 F (36.8 C)    TempSrc:  Oral    SpO2: 96% 93% 95% 95%  Weight:      Height:         Intake/Output Summary (Last 24 hours) at 11/13/16 0902 Last data filed at 11/13/16 0000  Gross per 24 hour  Intake              600 ml  Output              300 ml  Net              300 ml    PULM  CTAB CV  RRR GI  soft, NTND VASC  Ischemic R foot  Laboratory CBC    Component Value Date/Time   WBC 8.6 11/10/2016 1028   HGB 13.2 11/10/2016 1028   HCT 38.7 (L) 11/10/2016 1028   PLT 173 11/10/2016 1028    BMET    Component Value Date/Time   NA 138 11/10/2016 1028   NA 136 (A) 02/04/2016   K 3.7 11/10/2016 1028   CL 106 11/10/2016 1028   CO2 23 11/10/2016 1028   GLUCOSE 111 (H) 11/10/2016 1028   BUN 5 (L) 11/10/2016 1028   BUN 15 02/04/2016   CREATININE 0.63 11/10/2016 1028   CALCIUM 8.9 11/10/2016 1028   GFRNONAA >60 11/10/2016 1028   GFRAA >60 11/10/2016 1028     Leonides Sake, MD, FACS [facility] [place] Office: [phone] Pager: [phone]  11/13/2016, 9:02 AM

## 2016-11-14 LAB — SURGICAL PCR SCREEN
MRSA, PCR: NEGATIVE
STAPHYLOCOCCUS AUREUS: NEGATIVE

## 2016-11-14 NOTE — Progress Notes (Signed)
   Preoperative Note   Procedure: R AKA  Date: 11/15/16  Preoperative diagnosis: R foot ischemia, s/p thrombosed right fem-tib bypass  Consent: ordered  Laboratory:  CBC:    Component Value Date/Time   WBC 8.6 11/10/2016 1028   RBC 4.29 11/10/2016 1028   HGB 13.2 11/10/2016 1028   HCT 38.7 (L) 11/10/2016 1028   PLT 173 11/10/2016 1028   MCV 90.2 11/10/2016 1028   MCH 30.8 11/10/2016 1028   MCHC 34.1 11/10/2016 1028   RDW 13.4 11/10/2016 1028   LYMPHSABS 1.7 11/10/2016 1028   MONOABS 0.5 11/10/2016 1028   EOSABS 0.1 11/10/2016 1028   BASOSABS 0.0 11/10/2016 1028    BMP:    Component Value Date/Time   NA 138 11/10/2016 1028   NA 136 (A) 02/04/2016   K 3.7 11/10/2016 1028   CL 106 11/10/2016 1028   CO2 23 11/10/2016 1028   GLUCOSE 111 (H) 11/10/2016 1028   BUN 5 (L) 11/10/2016 1028   BUN 15 02/04/2016   CREATININE 0.63 11/10/2016 1028   CALCIUM 8.9 11/10/2016 1028   GFRNONAA >60 11/10/2016 1028   GFRAA >60 11/10/2016 1028    Coagulation: Lab Results  Component Value Date   INR 1.05 11/07/2016   INR 1.04 10/25/2016   INR 1.01 02/04/2016   No results found for: PTT  Cardiology:  EKG: ordered  CXR:  11/12/16 No active cardiopulmonary disease.   Leonides Sake, MD [facility] [place] Office: [phone] Pager: [phone]  11/14/2016, 8:05 AM

## 2016-11-15 ENCOUNTER — Inpatient Hospital Stay (HOSPITAL_COMMUNITY): Payer: Medicaid Other | Admitting: Anesthesiology

## 2016-11-15 ENCOUNTER — Encounter (HOSPITAL_COMMUNITY): Payer: Self-pay | Admitting: Anesthesiology

## 2016-11-15 ENCOUNTER — Encounter (HOSPITAL_COMMUNITY)
Admission: EM | Disposition: A | Discharge status: Rehabilitation Facility | Payer: Self-pay | Source: Home / Self Care | Attending: Vascular Surgery

## 2016-11-15 DIAGNOSIS — T82858A Stenosis of vascular prosthetic devices, implants and grafts, initial encounter: Secondary | ICD-10-CM

## 2016-11-15 HISTORY — PX: AMPUTATION: SHX166

## 2016-11-15 LAB — BASIC METABOLIC PANEL
Anion gap: 7 (ref 5–15)
BUN: 8 mg/dL (ref 6–20)
CALCIUM: 8.9 mg/dL (ref 8.9–10.3)
CO2: 27 mmol/L (ref 22–32)
Chloride: 104 mmol/L (ref 101–111)
Creatinine, Ser: 0.68 mg/dL (ref 0.61–1.24)
GFR calc Af Amer: >60 mL/min (ref >60)
Glucose, Bld: 115 mg/dL — ABNORMAL HIGH (ref 65–99)
POTASSIUM: 3.8 mmol/L (ref 3.5–5.1)
Sodium: 138 mmol/L (ref 135–145)

## 2016-11-15 LAB — CBC
HEMATOCRIT: 36.5 % — AB (ref 39.0–52.0)
Hemoglobin: 12.2 g/dL — ABNORMAL LOW (ref 13.0–17.0)
MCH: 30.3 pg (ref 26.0–34.0)
MCHC: 33.4 g/dL (ref 30.0–36.0)
MCV: 90.8 fL (ref 78.0–100.0)
PLATELETS: 176 10*3/uL (ref 150–400)
RBC: 4.02 MIL/uL — AB (ref 4.22–5.81)
RDW: 13.3 % (ref 11.5–15.5)
WBC: 6.5 10*3/uL (ref 4.0–10.5)

## 2016-11-15 LAB — GLUCOSE, CAPILLARY
GLUCOSE-CAPILLARY: 108 mg/dL — AB (ref 65–99)
Glucose-Capillary: 103 mg/dL — ABNORMAL HIGH (ref 65–99)

## 2016-11-15 SURGERY — AMPUTATION, ABOVE KNEE
Anesthesia: General | Site: Thigh | Laterality: Right

## 2016-11-15 MED ORDER — PROPOFOL 10 MG/ML IV BOLUS
INTRAVENOUS | Status: AC
  Filled 2016-11-15: qty 20

## 2016-11-15 MED ORDER — HYDROMORPHONE HCL 1 MG/ML IJ SOLN
INTRAMUSCULAR | Status: AC
  Filled 2016-11-15: qty 1

## 2016-11-15 MED ORDER — PROPOFOL 10 MG/ML IV BOLUS
INTRAVENOUS | Status: DC | PRN
  Administered 2016-11-15: 200 mg via INTRAVENOUS

## 2016-11-15 MED ORDER — SUGAMMADEX SODIUM 200 MG/2ML IV SOLN
INTRAVENOUS | Status: DC | PRN
  Administered 2016-11-15: 200 mg via INTRAVENOUS

## 2016-11-15 MED ORDER — FENTANYL CITRATE (PF) 100 MCG/2ML IJ SOLN
INTRAMUSCULAR | Status: DC | PRN
  Administered 2016-11-15: 100 ug via INTRAVENOUS
  Administered 2016-11-15: 50 ug via INTRAVENOUS
  Administered 2016-11-15 (×2): 100 ug via INTRAVENOUS
  Administered 2016-11-15: 50 ug via INTRAVENOUS
  Administered 2016-11-15: 100 ug via INTRAVENOUS

## 2016-11-15 MED ORDER — ONDANSETRON HCL 4 MG/2ML IJ SOLN
INTRAMUSCULAR | Status: AC
  Filled 2016-11-15: qty 2

## 2016-11-15 MED ORDER — MAGNESIUM SULFATE 2 GM/50ML IV SOLN: 2.0000 g | Freq: Every day | INTRAVENOUS | Status: DC | PRN

## 2016-11-15 MED ORDER — MIDAZOLAM HCL 2 MG/2ML IJ SOLN
INTRAMUSCULAR | Status: AC
  Filled 2016-11-15: qty 2

## 2016-11-15 MED ORDER — FENTANYL CITRATE (PF) 250 MCG/5ML IJ SOLN
INTRAMUSCULAR | Status: AC
  Filled 2016-11-15: qty 5

## 2016-11-15 MED ORDER — POTASSIUM CHLORIDE CRYS ER 20 MEQ PO TBCR: 20.0000 meq | EXTENDED_RELEASE_TABLET | Freq: Every day | ORAL | Status: DC | PRN

## 2016-11-15 MED ORDER — ARTIFICIAL TEARS OP OINT
TOPICAL_OINTMENT | OPHTHALMIC | Status: DC | PRN
  Administered 2016-11-15: 1 via OPHTHALMIC

## 2016-11-15 MED ORDER — HYDROMORPHONE HCL 1 MG/ML IJ SOLN
0.2500 mg | INTRAMUSCULAR | Status: DC | PRN
  Administered 2016-11-15 (×4): 0.5 mg via INTRAVENOUS

## 2016-11-15 MED ORDER — CEFAZOLIN SODIUM-DEXTROSE 2-3 GM-% IV SOLR
INTRAVENOUS | Status: DC | PRN
  Administered 2016-11-15: 2 g via INTRAVENOUS

## 2016-11-15 MED ORDER — SUGAMMADEX SODIUM 200 MG/2ML IV SOLN
INTRAVENOUS | Status: AC
  Filled 2016-11-15: qty 2

## 2016-11-15 MED ORDER — LIDOCAINE 2% (20 MG/ML) 5 ML SYRINGE
INTRAMUSCULAR | Status: AC
  Filled 2016-11-15: qty 5

## 2016-11-15 MED ORDER — ROCURONIUM BROMIDE 100 MG/10ML IV SOLN
INTRAVENOUS | Status: DC | PRN
  Administered 2016-11-15: 50 mg via INTRAVENOUS

## 2016-11-15 MED ORDER — LACTATED RINGERS IV SOLN
INTRAVENOUS | Status: DC
  Administered 2016-11-15 (×2): via INTRAVENOUS

## 2016-11-15 MED ORDER — DEXTROSE 5 % IV SOLN
1.5000 g | Freq: Two times a day (BID) | INTRAVENOUS | Status: AC
  Administered 2016-11-15 – 2016-11-16 (×2): 1.5 g via INTRAVENOUS
  Filled 2016-11-15 (×2): qty 1.5

## 2016-11-15 MED ORDER — MIDAZOLAM HCL 5 MG/5ML IJ SOLN
INTRAMUSCULAR | Status: DC | PRN
  Administered 2016-11-15: 2 mg via INTRAVENOUS

## 2016-11-15 MED ORDER — PROMETHAZINE HCL 25 MG/ML IJ SOLN: 6.2500 mg | INTRAMUSCULAR | Status: DC | PRN

## 2016-11-15 MED ORDER — ONDANSETRON HCL 4 MG/2ML IJ SOLN
INTRAMUSCULAR | Status: DC | PRN
  Administered 2016-11-15: 4 mg via INTRAVENOUS

## 2016-11-15 MED ORDER — CEFAZOLIN SODIUM-DEXTROSE 2-4 GM/100ML-% IV SOLN
INTRAVENOUS | Status: AC
  Filled 2016-11-15: qty 100

## 2016-11-15 MED ORDER — CEFAZOLIN SODIUM-DEXTROSE 2-4 GM/100ML-% IV SOLN: 2.0000 g | Freq: Once | INTRAVENOUS | Status: DC

## 2016-11-15 MED ORDER — 0.9 % SODIUM CHLORIDE (POUR BTL) OPTIME
TOPICAL | Status: DC | PRN
  Administered 2016-11-15: 1000 mL

## 2016-11-15 MED ORDER — DEXTROSE 5 % IV SOLN
INTRAVENOUS | Status: DC | PRN
  Administered 2016-11-15: 13:00:00 via INTRAVENOUS

## 2016-11-15 MED ORDER — ROCURONIUM BROMIDE 50 MG/5ML IV SOSY
PREFILLED_SYRINGE | INTRAVENOUS | Status: AC
  Filled 2016-11-15: qty 5

## 2016-11-15 MED ORDER — ARTIFICIAL TEARS OP OINT
TOPICAL_OINTMENT | OPHTHALMIC | Status: AC
  Filled 2016-11-15: qty 3.5

## 2016-11-15 MED ORDER — LIDOCAINE 2% (20 MG/ML) 5 ML SYRINGE
INTRAMUSCULAR | Status: DC | PRN
  Administered 2016-11-15: 100 mg via INTRAVENOUS

## 2016-11-15 SURGICAL SUPPLY — 41 items
BANDAGE ACE 6X5 VEL STRL LF (GAUZE/BANDAGES/DRESSINGS) ×4 IMPLANT
BLADE SAW RECIP 87.9 MT (BLADE) ×2 IMPLANT
BNDG COHESIVE 4X5 TAN STRL (GAUZE/BANDAGES/DRESSINGS) ×2 IMPLANT
BNDG COHESIVE 6X5 TAN STRL LF (GAUZE/BANDAGES/DRESSINGS) ×2 IMPLANT
BNDG GAUZE ELAST 4 BULKY (GAUZE/BANDAGES/DRESSINGS) ×2 IMPLANT
CANISTER SUCT 3000ML PPV (MISCELLANEOUS) ×2 IMPLANT
CLIP TI MEDIUM 6 (CLIP) ×2 IMPLANT
COVER BACK TABLE 60X90IN (DRAPES) ×2 IMPLANT
COVER SURGICAL LIGHT HANDLE (MISCELLANEOUS) ×2 IMPLANT
DRAIN CHANNEL 19F RND (DRAIN) IMPLANT
DRAPE ORTHO SPLIT 77X108 STRL (DRAPES) ×2
DRAPE PROXIMA HALF (DRAPES) ×2 IMPLANT
DRAPE SURG ORHT 6 SPLT 77X108 (DRAPES) ×2 IMPLANT
DRSG ADAPTIC 3X8 NADH LF (GAUZE/BANDAGES/DRESSINGS) ×2 IMPLANT
ELECT REM PT RETURN 9FT ADLT (ELECTROSURGICAL) ×2
ELECTRODE REM PT RTRN 9FT ADLT (ELECTROSURGICAL) ×1 IMPLANT
EVACUATOR SILICONE 100CC (DRAIN) IMPLANT
GAUZE SPONGE 4X4 12PLY STRL (GAUZE/BANDAGES/DRESSINGS) ×2 IMPLANT
GAUZE SPONGE 4X4 12PLY STRL LF (GAUZE/BANDAGES/DRESSINGS) ×2 IMPLANT
GLOVE BIO SURGEON STRL SZ7.5 (GLOVE) ×2 IMPLANT
GOWN STRL REUS W/ TWL LRG LVL3 (GOWN DISPOSABLE) ×3 IMPLANT
GOWN STRL REUS W/TWL LRG LVL3 (GOWN DISPOSABLE) ×3
KIT BASIN OR (CUSTOM PROCEDURE TRAY) ×2 IMPLANT
KIT ROOM TURNOVER OR (KITS) ×2 IMPLANT
NS IRRIG 1000ML POUR BTL (IV SOLUTION) ×2 IMPLANT
PACK GENERAL/GYN (CUSTOM PROCEDURE TRAY) ×2 IMPLANT
PAD ARMBOARD 7.5X6 YLW CONV (MISCELLANEOUS) ×4 IMPLANT
STAPLER VISISTAT 35W (STAPLE) ×2 IMPLANT
STOCKINETTE IMPERVIOUS LG (DRAPES) ×2 IMPLANT
SUT ETHILON 3 0 PS 1 (SUTURE) IMPLANT
SUT SILK 2 0 SH (SUTURE) IMPLANT
SUT SILK 2 0 SH CR/8 (SUTURE) ×2 IMPLANT
SUT SILK 2 0 TIES 10X30 (SUTURE) ×2 IMPLANT
SUT VIC AB 2-0 CT1 18 (SUTURE) IMPLANT
SUT VIC AB 2-0 SH 18 (SUTURE) ×4 IMPLANT
SUT VIC AB 3-0 SH 27 (SUTURE) ×2
SUT VIC AB 3-0 SH 27X BRD (SUTURE) ×2 IMPLANT
TOWEL OR 17X24 6PK STRL BLUE (TOWEL DISPOSABLE) ×2 IMPLANT
TOWEL OR 17X26 10 PK STRL BLUE (TOWEL DISPOSABLE) ×2 IMPLANT
UNDERPAD 30X30 (UNDERPADS AND DIAPERS) ×2 IMPLANT
WATER STERILE IRR 1000ML POUR (IV SOLUTION) ×2 IMPLANT

## 2016-11-15 NOTE — Op Note (Signed)
[facility] OPERATIVE NOTE   Procedure: Right above knee amputation  Surgeon(s): Sherren Kerns, MD  ASSISTANT: Karsten Ro, PA-C  Anesthesia: General  Specimens: Right leg  PROCEDURE DETAIL: After obtaining informed consent, the patient was taken to the operating room. The patient was placed in supine position the operating room table. After induction of general anesthesia and endotracheal intubation the patient's Foley catheter was placed. Next patient's entire right lower extremity was prepped and draped in usual sterile fashion. A circumferential incision was made on the right leg just above the knee. The incision was carried down into the sucutaneous tissues down to level the saphenous vein. This had been previously harvested but there was a larger seroma in this area. Soft tissues were taken down as well as the muscle and fascia with cautery. The superficial femoral artery and vein were dissected free circumferentially clamped and divided. A vein bypass was also clamped and divided. These were suture ligated proximally. Remainder of the soft tissues were taken down with cautery. The periosteum was raised on the femur approximately 5 cm above the skin edge. The femur was divided at this level. The leg was passed off the table as a specimen. Hemostasis was obtained. The wound was thoroughly irrigated with normal saline solution. The fascial edges were reapproximated using interrupted 2 0 Vicryl sutures. The subcutaneous tissues reapproximated using a running 3-0 Vicryl suture. The skin was closed staples. Patient tolerated procedure well and there were no complications. Instrument sponge and needle counts correct in the case. Patient was taken to recovery in stable condition.  Fabienne Bruns, MD [facility] [place] Office: [phone] Pager: [phone]

## 2016-11-15 NOTE — Anesthesia Preprocedure Evaluation (Signed)
Anesthesia Evaluation  Patient identified by MRN, date of birth, ID band Patient awake    Reviewed: Allergy & Precautions, NPO status , Patient's Chart, lab work & pertinent test results  Airway Mallampati: II  TM Distance: >3 FB Neck ROM: Full    Dental no notable dental hx.    Pulmonary Current Smoker, former smoker,    breath sounds clear to auscultation       Cardiovascular hypertension, + Peripheral Vascular Disease   Rhythm:Regular Rate:Normal     Neuro/Psych CVA    GI/Hepatic negative GI ROS, Neg liver ROS,   Endo/Other  diabetes  Renal/GU      Musculoskeletal   Abdominal   Peds  Hematology   Anesthesia Other Findings   Reproductive/Obstetrics                             Anesthesia Physical Anesthesia Plan  ASA: III  Anesthesia Plan: General   Post-op Pain Management:    Induction: Intravenous  Airway Management Planned: Oral ETT  Additional Equipment:   Intra-op Plan:   Post-operative Plan: Extubation in OR  Informed Consent: I have reviewed the patients History and Physical, chart, labs and discussed the procedure including the risks, benefits and alternatives for the proposed anesthesia with the patient or authorized representative who has indicated his/her understanding and acceptance.   Dental advisory given  Plan Discussed with: CRNA  Anesthesia Plan Comments:         Anesthesia Quick Evaluation

## 2016-11-15 NOTE — Interval H&P Note (Signed)
History and Physical Interval Note:  11/15/2016 11:20 AM  Ronald Forbes  has presented today for surgery, with the diagnosis of Occluded right lower extremity femoral to popliteal artery bypass graft T82.392A; Right foot pain M79.671  The various methods of treatment have been discussed with the patient and family. After consideration of risks, benefits and other options for treatment, the patient has consented to  Procedure(s): AMPUTATION ABOVE KNEE (Right) as a surgical intervention .  The patient's history has been reviewed, patient examined, no change in status, stable for surgery.  I have reviewed the patient's chart and labs.  Questions were answered to the patient's satisfaction.     Fabienne Bruns

## 2016-11-15 NOTE — Anesthesia Procedure Notes (Addendum)
Procedure Name: Intubation Date/Time: 11/15/2016 1:36 PM Performed by: Jacquiline Doe A Pre-anesthesia Checklist: Patient identified, Emergency Drugs available, Suction available and Patient being monitored Patient Re-evaluated:Patient Re-evaluated prior to inductionOxygen Delivery Method: Circle System Utilized and Circle system utilized Preoxygenation: Pre-oxygenation with 100% oxygen Intubation Type: IV induction and Cricoid Pressure applied Ventilation: Mask ventilation without difficulty Laryngoscope Size: Mac and 4 ("Sniffing" position , flexed .) Grade View: Grade I Tube type: Oral Tube size: 7.5 mm Number of attempts: 1 Airway Equipment and Method: Stylet and Oral airway Placement Confirmation: ETT inserted through vocal cords under direct vision,  positive ETCO2 and breath sounds checked- equal and bilateral Secured at: 23 cm Tube secured with: Tape Dental Injury: Teeth and Oropharynx as per pre-operative assessment

## 2016-11-15 NOTE — Transfer of Care (Signed)
Immediate Anesthesia Transfer of Care Note  Patient: Ronald Forbes  Procedure(s) Performed: Procedure(s): AMPUTATION ABOVE KNEE (Right)  Patient Location: PACU  Anesthesia Type:General  Level of Consciousness: awake, oriented, sedated, patient cooperative and responds to stimulation  Airway & Oxygen Therapy: Patient Spontanous Breathing and Patient connected to face mask oxygen  Post-op Assessment: Report given to RN and Post -op Vital signs reviewed and stable  Post vital signs: Reviewed and stable  Last Vitals:  Vitals:   11/15/16 0817 11/15/16 1505  BP:    Pulse:    Resp: 18   Temp:  (P) 36.5 C    Last Pain:  Vitals:   11/15/16 0817  TempSrc:   PainSc: 10-Worst pain ever         Complications: No apparent anesthesia complications

## 2016-11-16 ENCOUNTER — Encounter (HOSPITAL_COMMUNITY): Payer: Self-pay | Admitting: Vascular Surgery

## 2016-11-16 ENCOUNTER — Inpatient Hospital Stay (HOSPITAL_COMMUNITY): Payer: Medicaid Other

## 2016-11-16 DIAGNOSIS — I739 Peripheral vascular disease, unspecified: Secondary | ICD-10-CM

## 2016-11-16 DIAGNOSIS — Z89611 Acquired absence of right leg above knee: Secondary | ICD-10-CM

## 2016-11-16 LAB — BASIC METABOLIC PANEL
Anion gap: 11 (ref 5–15)
BUN: 5 mg/dL — AB (ref 6–20)
CALCIUM: 9 mg/dL (ref 8.9–10.3)
CO2: 26 mmol/L (ref 22–32)
CREATININE: 0.71 mg/dL (ref 0.61–1.24)
Chloride: 101 mmol/L (ref 101–111)
GFR calc Af Amer: >60 mL/min (ref >60)
GFR calc non Af Amer: >60 mL/min (ref >60)
Glucose, Bld: 121 mg/dL — ABNORMAL HIGH (ref 65–99)
Potassium: 3.8 mmol/L (ref 3.5–5.1)
Sodium: 138 mmol/L (ref 135–145)

## 2016-11-16 LAB — CBC
HCT: 34.5 % — ABNORMAL LOW (ref 39.0–52.0)
Hemoglobin: 11.6 g/dL — ABNORMAL LOW (ref 13.0–17.0)
MCH: 30.4 pg (ref 26.0–34.0)
MCHC: 33.6 g/dL (ref 30.0–36.0)
MCV: 90.3 fL (ref 78.0–100.0)
PLATELETS: 175 10*3/uL (ref 150–400)
RBC: 3.82 MIL/uL — ABNORMAL LOW (ref 4.22–5.81)
RDW: 13 % (ref 11.5–15.5)
WBC: 9 10*3/uL (ref 4.0–10.5)

## 2016-11-16 NOTE — Progress Notes (Signed)
Rehab admissions - I met briefly with patient.  I will follow progress and see if patient wants to come to inpatient rehab once medically stable.  He is known to me from previous CIR admission.  Call me for questions.  #[phone]

## 2016-11-16 NOTE — Evaluation (Signed)
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 Pt also wtih PMHx: DM, etoh and tobacco abuse  Clinical Impression  Pt admitted with above diagnosis. Pt currently with functional limitations due to the deficits listed below (see PT Problem List). Pt requiring +2 assistance for transfers at this time. Ambulation was attempted, pt unable to unweight LLE to take step. Pt was able to slide foot forward and back X2. Based upon the patient's current mobility level, recommending CIR for further rehabilitation following acute stay. Pt will benefit from skilled PT to increase their independence and safety with mobility.      Follow Up Recommendations CIR    Equipment Recommendations  Rolling walker with 5" wheels;Wheelchair (measurements PT);Wheelchair cushion (measurements PT)    Recommendations for Other Services Rehab consult     Precautions / Restrictions Precautions Precautions: Fall Precaution Comments: h/o R hemi due to CVA in June 2017 Restrictions Weight Bearing Restrictions: Yes RLE Weight Bearing: Non weight bearing      Mobility  Bed Mobility Overal bed mobility: Needs Assistance Bed Mobility: Supine to Sit     Supine to sit: Mod assist     General bed mobility comments: min guard A to scoot to EOB with cues  Transfers Overall transfer level: Needs assistance Equipment used: Rolling walker (2 wheeled) Transfers: Sit to/from Stand Sit to Stand: Min assist;From elevated surface (+2 min assist from elevated bed, +2 mod assist from chair) Stand pivot transfers: Min assist;+2 physical assistance       General transfer comment: cues for sequence and assist with Rt UE plaement on rw.    Ambulation/Gait Ambulation/Gait assistance: +2 physical assistance;Mod assist Ambulation Distance (Feet): 2 Feet Assistive device: Rolling walker (2 wheeled) Gait Pattern/deviations:  (hop-to) Gait velocity: slow sequence   General Gait Details: pt sliding LLE forward X2 and back. Unable to fully unweight for swing-to step.   Stairs            Wheelchair Mobility    Modified Rankin (Stroke Patients Only)       Balance Overall balance assessment: Needs assistance Sitting-balance support: No upper extremity supported Sitting balance-Leahy Scale: Good     Standing balance support: Bilateral upper extremity supported;During functional activity Standing balance-Leahy Scale: Poor Standing balance comment: reliance on UE support                             Pertinent Vitals/Pain Pain Assessment: 0-10 Pain Score: 7  Pain Location: R residual limb Pain Descriptors / Indicators: Grimacing Pain Intervention(s): Limited activity within patient's tolerance;Monitored during session;PCA encouraged    Home Living Family/patient expects to be discharged to:: Unsure Living Arrangements: Alone Available Help at Discharge: Family (reports sister can come and live with him. When asked if she works he said,  "Yes, but she felt like she was going to lose her job, and she might already have but I haven't talked to her lately") Type of Home: Apartment Home Access: Level entry     Home Layout: One level Home Equipment: Cane - single point Additional Comments: no family present to confirm if family available to assist following D/C.     Prior Function Level of Independence: Independent  Hand Dominance   Dominant Hand: Right (but mainly left now due to old CVA)    Extremity/Trunk Assessment   Upper Extremity Assessment Upper Extremity Assessment: Defer to OT evaluation RUE Deficits / Details: limited RUE use due to residual weakness and limited  range from prior CVA RUE Coordination: decreased fine motor;decreased gross motor    Lower Extremity Assessment Lower Extremity Assessment: LLE deficits/detail LLE Deficits / Details: Good overall functional strength.        Communication   Communication: Expressive difficulties (related to prior CVA)  Cognition Arousal/Alertness: Awake/alert Behavior During Therapy: Flat affect Overall Cognitive Status: No family/caregiver present to determine baseline cognitive functioning Area of Impairment: Safety/judgement                       Following Commands: Follows one step commands with increased time Safety/Judgement: Decreased awareness of safety   Problem Solving: Difficulty sequencing;Requires verbal cues General Comments: Poor awareness of deficits and relation to mobility.       General Comments      Exercises     Assessment/Plan    PT Assessment Patient needs continued PT services  PT Problem List Decreased strength;Decreased range of motion;Decreased activity tolerance;Decreased balance;Decreased mobility;Decreased knowledge of use of DME;Decreased safety awareness       PT Treatment Interventions DME instruction;Gait training;Functional mobility training;Therapeutic activities;Therapeutic exercise;Balance training;Neuromuscular re-education;Patient/family education;Wheelchair mobility training    PT Goals (Current goals can be found in the Care Plan section)  Acute Rehab PT Goals Patient Stated Goal: go home PT Goal Formulation: With patient Time For Goal Achievement: 11/30/16 Potential to Achieve Goals: Good    Frequency Min 3X/week   Barriers to discharge        Co-evaluation PT/OT/SLP Co-Evaluation/Treatment: Yes Reason for Co-Treatment: For patient/therapist safety PT goals addressed during session: Mobility/safety with mobility OT goals addressed during session: ADL's and self-care;Strengthening/ROM       End of Session Equipment  Utilized During Treatment: Gait belt Activity Tolerance: Patient limited by fatigue;Patient limited by pain Patient left: in chair;with call bell/phone within reach;with chair alarm set Nurse Communication: Mobility status PT Visit Diagnosis: Unsteadiness on feet (R26.81);Muscle weakness (generalized) (M62.81);Difficulty in walking, not elsewhere classified (R26.2)    Time: 1610-9604 PT Time Calculation (min) (ACUTE ONLY): 26 min   Charges:   PT Evaluation $PT Eval Moderate Complexity: 1 Procedure     PT G Codes:        Christiane Ha, PT, CSCS Pager [phone] Office [phone]   Delton See 11/16/2016, 12:49 PM

## 2016-11-16 NOTE — Progress Notes (Signed)
[facility]  Subjective  - no real pain   Objective (!) 145/72 81 (!) 101.4 F (38.6 C) (Oral) 18 99%  Intake/Output Summary (Last 24 hours) at 11/16/16 1020 Last data filed at 11/16/16 0551  Gross per 24 hour  Intake          2035.33 ml  Output             3425 ml  Net         -1389.67 ml   Right AKA looks good so far  Assessment/Planning: POD #1 AKA D/c pca OOB  Would benefit from Rehab but pt relucatant Post op fever will check urine chest xray Should be ready for d/c tomorrow  Fabienne Bruns 11/16/2016 10:20 AM --  Laboratory Lab Results:  Recent Labs  11/15/16 0220 11/16/16 0254  WBC 6.5 9.0  HGB 12.2* 11.6*  HCT 36.5* 34.5*  PLT 176 175   BMET  Recent Labs  11/15/16 0220 11/16/16 0254  NA 138 138  K 3.8 3.8  CL 104 101  CO2 27 26  GLUCOSE 115* 121*  BUN 8 5*  CREATININE 0.68 0.71  CALCIUM 8.9 9.0    COAG Lab Results  Component Value Date   INR 1.05 11/07/2016   INR 1.04 10/25/2016   INR 1.01 02/04/2016   No results found for: PTT

## 2016-11-16 NOTE — Consult Note (Signed)
Physical Medicine and Rehabilitation Consult Reason for Consult: Decreased functional mobility after right AKA Referring Physician: Dr. Darrick Penna   HPI: Ronald Forbes is a 56 y.o. 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 Patient well known to rehabilitation services from recent admission 10/28/2016-11/01/2016 for right lower extremity bypass grafting and was discharged to home with his sister. Patient was needing ongoing encouragement to partcipate while attending his therapy program. He presented 11/15/2016 with findings of occluded right lower extremity femoral-to-popliteal artery bypass graft. Ongoing ischemic changes to right lower extremity and no change with conservative care. 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 Darrick Penna. Hospital course pain management. Physical and occupational therapy evaluations pending. M.D. has requested physical medicine rehabilitation consult.   Review of Systems  Constitutional: Negative for chills and fever.  HENT: Negative for hearing loss.   Eyes: Negative for blurred vision and double vision.  Respiratory: Negative for cough and shortness of breath.   Cardiovascular: Positive for leg swelling. Negative for chest pain and palpitations.  Gastrointestinal: Positive for constipation. Negative for nausea and vomiting.  Genitourinary: Negative for dysuria, flank pain and hematuria.  Musculoskeletal: Positive for joint pain and myalgias.  Skin: Negative for rash.  Neurological: Positive for weakness. Negative for seizures.  All other systems reviewed and are negative.  Past Medical History:  Diagnosis Date  . Alcohol use disorder (HCC)   . Cerebral vascular accident (HCC)   . Cerebrovascular accident (CVA) due to thrombosis of left posterior cerebral artery (HCC)     . Diabetes mellitus type 2 in obese (HCC)   . HLD (hyperlipidemia)   . Morbid obesity (HCC)   . Stroke (HCC)   . Tobacco abuse    Past Surgical History:  Procedure Laterality Date  . ABDOMINAL AORTOGRAM W/LOWER EXTREMITY Right 10/20/2016   Procedure: Abdominal Aortogram w/Lower Extremity;  Surgeon: Maeola Harman, MD;  Location: [facility] INVASIVE CV LAB;  Service: Cardiovascular;  Laterality: Right;  lower leg  . EP IMPLANTABLE DEVICE N/A 02/09/2016   Procedure: Loop Recorder Insertion;  Surgeon: Duke Salvia, MD;  Location: [facility] INVASIVE CV LAB;  Service: Cardiovascular;  Laterality: N/A;  . FEMORAL-TIBIAL BYPASS GRAFT Right 10/25/2016   Procedure: BYPASS GRAFT FEMORAL-TIBIAL ARTERY USING NON REVERSED SAPPHENOUS VEIN;  Surgeon: Sherren Kerns, MD;  Location: [facility] OR;  Service: Vascular;  Laterality: Right;  . INTRAOPERATIVE ARTERIOGRAM Right 10/25/2016   Procedure: INTRA OPERATIVE ARTERIOGRAM;  Surgeon: Sherren Kerns, MD;  Location: [facility] OR;  Service: Vascular;  Laterality: Right;  . TEE WITHOUT CARDIOVERSION N/A 02/06/2016   Procedure: TRANSESOPHAGEAL ECHOCARDIOGRAM (TEE);  Surgeon: Wendall Stade, MD;  Location: [facility] ENDOSCOPY;  Service: Cardiovascular;  Laterality: N/A;   History reviewed. No pertinent family history. Social History:  reports that he quit smoking about 4 weeks ago. His smoking use included Cigarettes. He has a 40.00 pack-year smoking history. He has never used smokeless tobacco. He reports that he drinks alcohol. He reports that he uses drugs, including Marijuana. Allergies:  Allergies  Allergen Reactions  . No Known Allergies    Medications Prior to Admission  Medication Sig Dispense Refill  . oxyCODONE-acetaminophen (PERCOCET/ROXICET) 5-325 MG tablet Take 1-2 tablets by mouth every 6 (six) hours as needed for severe pain. 15 tablet 0  . aspirin 81 MG chewable tablet Chew 1 tablet (81 mg total) by  mouth daily. (Patient not taking: Reported on 11/10/2016) 30 tablet 0   . atorvastatin (LIPITOR) 40 MG tablet Take 1 tablet (40 mg total) by mouth daily at 6 PM. (Patient not taking: Reported on 11/10/2016) 30 tablet 0  . HYDROcodone-acetaminophen (NORCO/VICODIN) 5-325 MG tablet Take 1-2 tablets by mouth every 4 (four) hours as needed for moderate pain. (Patient not taking: Reported on 11/10/2016) 30 tablet 0    Home: Home Living Family/patient expects to be discharged to:: Private residence Living Arrangements: Alone  Functional History:   Functional Status:  Mobility:          ADL:    Cognition: Cognition Orientation Level: Oriented X4    Blood pressure (!) 145/72, pulse 81, temperature (!) 101.4 F (38.6 C), temperature source Oral, resp. rate 18, height  (1.905 m), weight 108.9 kg (240 lb), SpO2 95 %. Physical Exam  Vitals reviewed. HENT:  Head: Normocephalic.  Eyes: EOM are normal.  Neck: Normal range of motion. Neck supple. No thyromegaly present.  Cardiovascular: Normal rate and regular rhythm.   Respiratory: Effort normal and breath sounds normal. No respiratory distress.  GI: Soft. Bowel sounds are normal. He exhibits no distension.  Neurological:  Alert.. Follows simple commands. Provides his name and age and place. Moves all 4's. Can move right thigh against gravity  Skin:  Amputation site dressed appropriately tender  Psychiatric:  Quiet, sometime irritable    Results for orders placed or performed during the hospital encounter of 11/10/16 (from the past 24 hour(s))  Glucose, capillary     Status: Abnormal   Collection Time: 11/15/16 11:34 AM  Result Value Ref Range   Glucose-Capillary 108 (H) 65 - 99 mg/dL  Glucose, capillary     Status: Abnormal   Collection Time: 11/15/16  3:04 PM  Result Value Ref Range   Glucose-Capillary 103 (H) 65 - 99 mg/dL  Basic metabolic panel     Status: Abnormal   Collection Time: 11/16/16  2:54 AM  Result Value Ref Range   Sodium 138 135 - 145 mmol/L   Potassium 3.8 3.5 - 5.1 mmol/L    Chloride 101 101 - 111 mmol/L   CO2 26 22 - 32 mmol/L   Glucose, Bld 121 (H) 65 - 99 mg/dL   BUN 5 (L) 6 - 20 mg/dL   Creatinine, Ser 1.47 0.61 - 1.24 mg/dL   Calcium 9.0 8.9 - 82.9 mg/dL   GFR calc non Af Amer >60 >60 mL/min   GFR calc Af Amer >60 >60 mL/min   Anion gap 11 5 - 15  CBC     Status: Abnormal   Collection Time: 11/16/16  2:54 AM  Result Value Ref Range   WBC 9.0 4.0 - 10.5 K/uL   RBC 3.82 (L) 4.22 - 5.81 MIL/uL   Hemoglobin 11.6 (L) 13.0 - 17.0 g/dL   HCT 56.2 (L) 13.0 - 86.5 %   MCV 90.3 78.0 - 100.0 fL   MCH 30.4 26.0 - 34.0 pg   MCHC 33.6 30.0 - 36.0 g/dL   RDW 78.4 69.6 - 29.5 %   Platelets 175 150 - 400 K/uL   No results found.  Assessment/Plan: Diagnosis: Right AKA 1. Does the need for close, 24 hr/day medical supervision in concert with the patient's rehab needs make it unreasonable for this patient to be served in a less intensive setting? Yes 2. Co-Morbidities requiring supervision/potential complications: DM2, PAD, abla, htn 3. Due to bladder management, bowel management, safety, skin/wound care, disease management,  medication administration, pain management and patient education, does the patient require 24 hr/day rehab nursing? Yes 4. Does the patient require coordinated care of a physician, rehab nurse, PT (1-2 hrs/day, 5 days/week) and OT (1-2 hrs/day, 5 days/week) to address physical and functional deficits in the context of the above medical diagnosis(es)? Yes Addressing deficits in the following areas: balance, endurance, locomotion, strength, transferring, bowel/bladder control, bathing, dressing, feeding, grooming, toileting and psychosocial support 5. Can the patient actively participate in an intensive therapy program of at least 3 hrs of therapy per day at least 5 days per week? Potentially 6. The potential for patient to make measurable gains while on inpatient rehab is good 7. Anticipated functional outcomes upon discharge from inpatient rehab  are modified independent  with PT, modified independent with OT, modified independent with SLP. 8. Estimated rehab length of stay to reach the above functional goals is: potentially 9-13 days 9. Does the patient have adequate social supports and living environment to accommodate these discharge functional goals? Potentially 10. Anticipated D/C setting: Home 11. Anticipated post D/C treatments: HH therapy and Outpatient therapy 12. Overall Rehab/Functional Prognosis: good  RECOMMENDATIONS: This patient's condition is appropriate for continued rehabilitative care in the following setting: CIR Patient has agreed to participate in recommended program. Potentially Note that insurance prior authorization may be required for reimbursement for recommended care.  Comment: I had an EXTENDED conversation with the patient and his family who was present at beside. I explained to them that I would consider inpatient rehab ONLY if there is 100% buy in on his part. During his last admission he was irritable, uncooperative, argumentative with staff leading to an early discharge per the patient's preference. If we bring him back to inpatient rehab, and there are any signs of this type of behavior again, he would be immediately discharged home. Family was in agreement with my assessment and comments and will talk with the patient regarding the next step. Rehab RN to follow up.   [name], MD, [facility] Physical Medicine & Rehabilitation 11/16/2016    Charlton Amor., PA-C 11/16/2016

## 2016-11-16 NOTE — Evaluation (Signed)
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 Pt also wtih PMHx: DM, etoh and tobacco abuse   Clinical Impression   This 56 yo male admitted and underwent above presents to acute OT with deficits below (see OT problem list) thus affecting his PLOF of Mod I with all basic and IADLs. He will benefit from acute OT with follow up OT on CIR to get to a Mod I W/C level.    Follow Up Recommendations  CIR;Supervision/Assistance - 24 hour    Equipment Recommendations  3 in 1 bedside commode;Wheelchair cushion (measurements OT);Wheelchair (measurements OT)       Precautions / Restrictions Precautions Precautions: Fall Precaution Comments: h/o R hemi due to CVA in June 2017 Restrictions Weight Bearing Restrictions: Yes RLE Weight Bearing: Non weight bearing      Mobility Bed Mobility Overal bed mobility: Needs Assistance Bed Mobility: Supine to Sit     Supine to sit: Mod assist     General bed mobility comments: min guard A to scoot to EOB  Transfers Overall transfer level: Needs assistance Equipment used: Rolling walker (2 wheeled) Transfers: Sit to/from Stand   Stand pivot transfers: Min assist;+2 physical assistance       General transfer comment: +2 min A sit<>stand from bed; +2 Mod A from recliner    Balance Overall balance assessment: Needs assistance Sitting-balance support: No upper extremity supported (foot supported) Sitting balance-Leahy Scale: Good     Standing balance support: Bilateral upper extremity supported;During functional activity Standing balance-Leahy Scale: Poor Standing balance comment: reliance on UE support                           ADL either performed or assessed  with clinical judgement   ADL Overall ADL's : Needs assistance/impaired Eating/Feeding: Independent;Sitting   Grooming: Set up;Sitting   Upper Body Bathing: Supervision/ safety;Set up;Sitting   Lower Body Bathing: Moderate assistance (+2 min A sit<>stand from bed; +2 Mod A from recliner)   Upper Body Dressing : Set up;Supervision/safety;Sitting   Lower Body Dressing: Moderate assistance Lower Body Dressing Details (indicate cue type and reason): +2 min A sit<>stand from bed; +2 Mod A from recliner   Toilet Transfer Details (indicate cue type and reason): +2 min A sit<>stand from bed; +2 Mod A from recliner Toileting- Clothing Manipulation and Hygiene: Moderate assistance (+2 min A sit<>stand from bed; +2 Mod A from recliner)               Vision Patient Visual Report: No change from baseline              Pertinent Vitals/Pain Pain Assessment: 0-10 Pain Score: 7  Pain Location: R residual limb Pain Descriptors / Indicators: Grimacing Pain Intervention(s): Limited activity within patient's tolerance;Monitored during session;Repositioned;Premedicated before session;PCA encouraged     Hand Dominance Right (but mainly left now due to old CVA)   Extremity/Trunk Assessment Upper Extremity Assessment Upper Extremity Assessment: RUE deficits/detail RUE Deficits / Details: limited RUE use due to residual weakness and limited range from prior CVA RUE Coordination: decreased fine motor;decreased gross motor           Communication Communication Communication: Expressive difficulties (due to old CVA)   Cognition Arousal/Alertness: Awake/alert Behavior During  Therapy: Flat affect Overall Cognitive Status:  (history of cognitive impairments--no family to determine if he is currrently at baseline) Area of Impairment: Following commands;Problem solving                       Following Commands: Follows one step commands with increased time     Problem Solving:  Difficulty sequencing;Requires verbal cues                Home Living Family/patient expects to be discharged to:: Inpatient rehab Living Arrangements: Alone Available Help at Discharge: Family (reports sister can come and live with him. When asked if she works he said,  "Yes, but she felt like she was going to lose her job, and she might already have but I haven't talked to her lately") Type of Home: Apartment Home Access: Level entry     Home Layout: One level     Bathroom Shower/Tub: Chief Strategy Officer: Standard     Home Equipment: Gilmer Mor - single point      Lives With: Alone    Prior Functioning/Environment Level of Independence: Independent                 OT Problem List: Decreased strength;Decreased range of motion;Impaired balance (sitting and/or standing);Pain;Decreased cognition;Decreased safety awareness;Decreased knowledge of use of DME or AE;Impaired UE functional use;Impaired tone      OT Treatment/Interventions: Self-care/ADL training;DME and/or AE instruction;Patient/family education;Balance training;Therapeutic activities    OT Goals(Current goals can be found in the care plan section) Acute Rehab OT Goals Patient Stated Goal: would like to go home OT Goal Formulation: With patient Time For Goal Achievement: 11/30/16 Potential to Achieve Goals: Good  OT Frequency: Min 2X/week   Barriers to D/C: Decreased caregiver support  but says his sister can come and stay with him       Co-evaluation PT/OT/SLP Co-Evaluation/Treatment: Yes Reason for Co-Treatment: To address functional/ADL transfers;For patient/therapist safety   OT goals addressed during session: ADL's and self-care;Strengthening/ROM      End of Session Equipment Utilized During Treatment: Rolling walker Nurse Communication: Mobility status (NT as well)  Activity Tolerance: Patient tolerated treatment well Patient left: in chair;with call bell/phone within  reach;with chair alarm set  OT Visit Diagnosis: Unsteadiness on feet (R26.81);Hemiplegia and hemiparesis;Pain Hemiplegia - Right/Left: Right Hemiplegia - dominant/non-dominant: Dominant Hemiplegia - caused by: Cerebral infarction Pain - Right/Left: Right Pain - part of body: Leg (residual limb)                Time: 9562-1308 OT Time Calculation (min): 23 min Charges:  OT General Charges $OT Visit: 1 Procedure OT Evaluation $OT Eval Moderate Complexity: 1 Procedure Ignacia Palma, OTR/L [phone] 11/16/2016

## 2016-11-17 ENCOUNTER — Telehealth: Payer: Self-pay | Admitting: Vascular Surgery

## 2016-11-17 ENCOUNTER — Inpatient Hospital Stay (HOSPITAL_COMMUNITY)
Admission: RE | Admit: 2016-11-17 | Discharge: 2016-11-24 | DRG: 560 | Disposition: A | Discharge status: Home Health | Payer: Medicaid Other | Source: Intra-hospital | Attending: Physical Medicine & Rehabilitation | Admitting: Physical Medicine & Rehabilitation

## 2016-11-17 ENCOUNTER — Encounter (HOSPITAL_COMMUNITY): Payer: Self-pay | Admitting: Physical Medicine and Rehabilitation

## 2016-11-17 DIAGNOSIS — Z6827 Body mass index (BMI) 27.0-27.9, adult: Secondary | ICD-10-CM

## 2016-11-17 DIAGNOSIS — IMO0002 Reserved for concepts with insufficient information to code with codable children: Secondary | ICD-10-CM

## 2016-11-17 DIAGNOSIS — E1151 Type 2 diabetes mellitus with diabetic peripheral angiopathy without gangrene: Secondary | ICD-10-CM

## 2016-11-17 DIAGNOSIS — E119 Type 2 diabetes mellitus without complications: Secondary | ICD-10-CM

## 2016-11-17 DIAGNOSIS — F1010 Alcohol abuse, uncomplicated: Secondary | ICD-10-CM | POA: Diagnosis present

## 2016-11-17 DIAGNOSIS — Z4781 Encounter for orthopedic aftercare following surgical amputation: Secondary | ICD-10-CM | POA: Diagnosis present

## 2016-11-17 DIAGNOSIS — I639 Cerebral infarction, unspecified: Secondary | ICD-10-CM | POA: Diagnosis not present

## 2016-11-17 DIAGNOSIS — F17220 Nicotine dependence, chewing tobacco, uncomplicated: Secondary | ICD-10-CM | POA: Diagnosis present

## 2016-11-17 DIAGNOSIS — S78111A Complete traumatic amputation at level between right hip and knee, initial encounter: Secondary | ICD-10-CM

## 2016-11-17 DIAGNOSIS — I6930 Unspecified sequelae of cerebral infarction: Secondary | ICD-10-CM | POA: Diagnosis not present

## 2016-11-17 DIAGNOSIS — D62 Acute posthemorrhagic anemia: Secondary | ICD-10-CM

## 2016-11-17 DIAGNOSIS — I69351 Hemiplegia and hemiparesis following cerebral infarction affecting right dominant side: Secondary | ICD-10-CM | POA: Diagnosis not present

## 2016-11-17 DIAGNOSIS — Z79899 Other long term (current) drug therapy: Secondary | ICD-10-CM | POA: Diagnosis not present

## 2016-11-17 DIAGNOSIS — Z89611 Acquired absence of right leg above knee: Secondary | ICD-10-CM | POA: Diagnosis not present

## 2016-11-17 DIAGNOSIS — E1142 Type 2 diabetes mellitus with diabetic polyneuropathy: Secondary | ICD-10-CM

## 2016-11-17 DIAGNOSIS — G4700 Insomnia, unspecified: Secondary | ICD-10-CM | POA: Diagnosis present

## 2016-11-17 DIAGNOSIS — E785 Hyperlipidemia, unspecified: Secondary | ICD-10-CM | POA: Diagnosis present

## 2016-11-17 DIAGNOSIS — R454 Irritability and anger: Secondary | ICD-10-CM | POA: Diagnosis not present

## 2016-11-17 DIAGNOSIS — I70269 Atherosclerosis of native arteries of extremities with gangrene, unspecified extremity: Secondary | ICD-10-CM

## 2016-11-17 DIAGNOSIS — I69359 Hemiplegia and hemiparesis following cerebral infarction affecting unspecified side: Secondary | ICD-10-CM | POA: Diagnosis not present

## 2016-11-17 DIAGNOSIS — I69320 Aphasia following cerebral infarction: Secondary | ICD-10-CM

## 2016-11-17 DIAGNOSIS — Z7982 Long term (current) use of aspirin: Secondary | ICD-10-CM | POA: Diagnosis not present

## 2016-11-17 DIAGNOSIS — G546 Phantom limb syndrome with pain: Secondary | ICD-10-CM

## 2016-11-17 LAB — CBC
HCT: 33 % — ABNORMAL LOW (ref 39.0–52.0)
Hemoglobin: 11 g/dL — ABNORMAL LOW (ref 13.0–17.0)
MCH: 30.2 pg (ref 26.0–34.0)
MCHC: 33.3 g/dL (ref 30.0–36.0)
MCV: 90.7 fL (ref 78.0–100.0)
Platelets: 167 10*3/uL (ref 150–400)
RBC: 3.64 MIL/uL — ABNORMAL LOW (ref 4.22–5.81)
RDW: 13 % (ref 11.5–15.5)
WBC: 8.8 10*3/uL (ref 4.0–10.5)

## 2016-11-17 LAB — BASIC METABOLIC PANEL
Anion gap: 7 (ref 5–15)
CHLORIDE: 107 mmol/L (ref 101–111)
CO2: 24 mmol/L (ref 22–32)
Calcium: 8.6 mg/dL — ABNORMAL LOW (ref 8.9–10.3)
Creatinine, Ser: 0.57 mg/dL — ABNORMAL LOW (ref 0.61–1.24)
GFR calc non Af Amer: >60 mL/min (ref >60)
Glucose, Bld: 133 mg/dL — ABNORMAL HIGH (ref 65–99)
POTASSIUM: 3.7 mmol/L (ref 3.5–5.1)
SODIUM: 138 mmol/L (ref 135–145)

## 2016-11-17 LAB — GLUCOSE, CAPILLARY: Glucose-Capillary: 144 mg/dL — ABNORMAL HIGH (ref 65–99)

## 2016-11-17 MED ORDER — ASPIRIN EC 81 MG PO TBEC
81.0000 mg | DELAYED_RELEASE_TABLET | Freq: Every day | ORAL | Status: DC
  Administered 2016-11-17 – 2016-11-24 (×8): 81 mg via ORAL
  Filled 2016-11-17 (×8): qty 1

## 2016-11-17 MED ORDER — GUAIFENESIN-DM 100-10 MG/5ML PO SYRP: 5.0000 mL | ORAL_SOLUTION | Freq: Four times a day (QID) | ORAL | Status: DC | PRN

## 2016-11-17 MED ORDER — PHENOL 1.4 % MT LIQD: 1.0000 | OROMUCOSAL | Status: DC | PRN

## 2016-11-17 MED ORDER — INSULIN ASPART 100 UNIT/ML ~~LOC~~ SOLN: 0.0000 [IU] | Freq: Every day | SUBCUTANEOUS | Status: DC

## 2016-11-17 MED ORDER — PROCHLORPERAZINE 25 MG RE SUPP: 12.5000 mg | Freq: Four times a day (QID) | RECTAL | Status: DC | PRN

## 2016-11-17 MED ORDER — TRAZODONE HCL 50 MG PO TABS
25.0000 mg | ORAL_TABLET | Freq: Every evening | ORAL | Status: DC | PRN
  Administered 2016-11-23: 50 mg via ORAL
  Filled 2016-11-17: qty 1

## 2016-11-17 MED ORDER — METHOCARBAMOL 500 MG PO TABS
500.0000 mg | ORAL_TABLET | Freq: Four times a day (QID) | ORAL | Status: DC | PRN
  Administered 2016-11-18 – 2016-11-24 (×15): 500 mg via ORAL
  Filled 2016-11-17 (×16): qty 1

## 2016-11-17 MED ORDER — ALUM & MAG HYDROXIDE-SIMETH 200-200-20 MG/5ML PO SUSP: 30.0000 mL | ORAL | Status: DC | PRN

## 2016-11-17 MED ORDER — PROCHLORPERAZINE MALEATE 5 MG PO TABS: 5.0000 mg | ORAL_TABLET | Freq: Four times a day (QID) | ORAL | Status: DC | PRN

## 2016-11-17 MED ORDER — ACETAMINOPHEN 325 MG PO TABS: 325.0000 mg | ORAL_TABLET | ORAL | Status: DC | PRN

## 2016-11-17 MED ORDER — DIPHENHYDRAMINE HCL 12.5 MG/5ML PO ELIX: 12.5000 mg | ORAL_SOLUTION | Freq: Four times a day (QID) | ORAL | Status: DC | PRN

## 2016-11-17 MED ORDER — PANTOPRAZOLE SODIUM 40 MG PO TBEC
40.0000 mg | DELAYED_RELEASE_TABLET | Freq: Every day | ORAL | Status: DC
  Administered 2016-11-18 – 2016-11-24 (×7): 40 mg via ORAL
  Filled 2016-11-17 (×7): qty 1

## 2016-11-17 MED ORDER — POLYSACCHARIDE IRON COMPLEX 150 MG PO CAPS
150.0000 mg | ORAL_CAPSULE | Freq: Two times a day (BID) | ORAL | Status: DC
  Administered 2016-11-17 – 2016-11-24 (×14): 150 mg via ORAL
  Filled 2016-11-17 (×14): qty 1

## 2016-11-17 MED ORDER — INSULIN ASPART 100 UNIT/ML ~~LOC~~ SOLN
0.0000 [IU] | Freq: Three times a day (TID) | SUBCUTANEOUS | Status: DC
  Administered 2016-11-18 (×2): 1 [IU] via SUBCUTANEOUS
  Administered 2016-11-20: 2 [IU] via SUBCUTANEOUS
  Administered 2016-11-20 – 2016-11-22 (×3): 1 [IU] via SUBCUTANEOUS

## 2016-11-17 MED ORDER — FLEET ENEMA 7-19 GM/118ML RE ENEM: 1.0000 | ENEMA | Freq: Once | RECTAL | Status: DC | PRN

## 2016-11-17 MED ORDER — HYDROCODONE-ACETAMINOPHEN 5-325 MG PO TABS
1.0000 | ORAL_TABLET | ORAL | Status: DC | PRN
  Administered 2016-11-17 – 2016-11-22 (×20): 2 via ORAL
  Filled 2016-11-17 (×21): qty 2

## 2016-11-17 MED ORDER — SORBITOL 70 % SOLN
45.0000 mL | Freq: Once | Status: AC
  Administered 2016-11-17: 45 mL via ORAL
  Filled 2016-11-17: qty 60

## 2016-11-17 MED ORDER — POLYETHYLENE GLYCOL 3350 17 G PO PACK: 17.0000 g | PACK | Freq: Every day | ORAL | Status: DC | PRN

## 2016-11-17 MED ORDER — GUAIFENESIN-DM 100-10 MG/5ML PO SYRP: 15.0000 mL | ORAL_SOLUTION | ORAL | Status: DC | PRN

## 2016-11-17 MED ORDER — BISACODYL 10 MG RE SUPP: 10.0000 mg | Freq: Every day | RECTAL | Status: DC | PRN

## 2016-11-17 MED ORDER — ENOXAPARIN SODIUM 40 MG/0.4ML ~~LOC~~ SOLN
40.0000 mg | SUBCUTANEOUS | Status: DC
  Filled 2016-11-17 (×8): qty 0.4

## 2016-11-17 MED ORDER — PROCHLORPERAZINE EDISYLATE 5 MG/ML IJ SOLN: 5.0000 mg | Freq: Four times a day (QID) | INTRAMUSCULAR | Status: DC | PRN

## 2016-11-17 MED ORDER — SENNOSIDES-DOCUSATE SODIUM 8.6-50 MG PO TABS
2.0000 | ORAL_TABLET | Freq: Two times a day (BID) | ORAL | Status: DC
  Administered 2016-11-17 – 2016-11-24 (×14): 2 via ORAL
  Filled 2016-11-17 (×14): qty 2

## 2016-11-17 NOTE — Progress Notes (Signed)
Report given to CIR RN. All questions answered.   Leonidas Romberg, RN

## 2016-11-17 NOTE — Progress Notes (Signed)
Pt transferred to [id number]. RN Doree Fudge and NT at bedside. Pt belongings (including cane and clothing) transferred with the patient.   Leonidas Romberg, RN

## 2016-11-17 NOTE — Progress Notes (Signed)
Rehab admissions - I met with patient this am.  He says he is agreeable to inpatient rehab admission.  He tells me that he is a little depressed today.  Bed available on rehab and will admit to acute inpatient rehab today.  Call me for questions.  #[phone]

## 2016-11-17 NOTE — Progress Notes (Signed)
Orthopedic Tech Progress Note Patient Details:  Ronald Forbes 1960-12-26 [id number] Brace completed by bio-tech Patient ID: [name], male   DOB: Jul 01, 1961, 56 y.o.   MRN: [id number]   [name] 11/17/2016, 3:41 PM

## 2016-11-17 NOTE — Progress Notes (Signed)
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 Darrick Penna   HPI: Ronald Forbes is a 56 y.o. 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 Patient well known to rehabilitation services from recent admission 10/28/2016-11/01/2016 for right lower extremity bypass grafting and was discharged to home with his sister. Patient was needing ongoing encouragement to partcipate while attending his therapy program. He presented 11/15/2016 with findings of occluded right lower extremity femoral-to-popliteal artery bypass graft. Ongoing ischemic changes to right lower extremity and no change with conservative care. 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 Darrick Penna. Hospital course pain management. Physical and occupational therapy evaluations pending. M.D. has requested physical medicine rehabilitation consult.   Review of Systems  Constitutional: Negative for chills and fever.  HENT: Negative for hearing loss.   Eyes: Negative for blurred vision and double vision.  Respiratory: Negative for cough and shortness of breath.   Cardiovascular: Positive for leg swelling. Negative for chest pain and palpitations.  Gastrointestinal: Positive for constipation. Negative for nausea and vomiting.  Genitourinary: Negative for dysuria, flank pain and hematuria.  Musculoskeletal: Positive for joint pain and myalgias.    Skin: Negative for rash.  Neurological: Positive for weakness. Negative for seizures.  All other systems reviewed and are negative.      Past Medical History:  Diagnosis Date  . Alcohol use disorder (HCC)   . Cerebral vascular accident (HCC)   . 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 Diabetes mellitus type 2 in obese (HCC)   . HLD (hyperlipidemia)   . Morbid obesity (HCC)   . Stroke (HCC)   . Tobacco abuse         Past Surgical History:  Procedure Laterality Date  . ABDOMINAL AORTOGRAM W/LOWER EXTREMITY Right 10/20/2016   Procedure: Abdominal Aortogram w/Lower Extremity;  Surgeon: Maeola Harman, MD;  Location: [facility] INVASIVE CV LAB;  Service: Cardiovascular;  Laterality: Right;  lower leg  . EP IMPLANTABLE DEVICE N/A 02/09/2016   Procedure: Loop Recorder Insertion;  Surgeon: Duke Salvia, MD;  Location[facility] INVASIVE CV LAB;  Service: Cardiovascular;  Laterality: N/A;  . FEMORAL-TIBIAL BYPASS GRAFT Right 10/25/2016   Procedure: BYPASS GRAFT FEMORAL-TIBIAL ARTERY USING NON REVERSED SAPPHENOUS VEIN;  Surgeon: Sherren Kerns, MD;  Location: [facility] OR;  Service: Vascular;  Laterality: Right;  . INTRAOPERATIVE ARTERIOGRAM Right 10/25/2016   Procedure: INTRA OPERATIVE ARTERIOGRAM;  Surgeon: Sherren Kerns, MD;  Location: [facility] OR;  Service: Vascular;  Laterality: Right;  . TEE WITHOUT CARDIOVERSION N/A 02/06/2016   Procedure: TRANSESOPHAGEAL ECHOCARDIOGRAM (TEE);  Surgeon: Wendall Stade, MD;  Location: [facility] ENDOSCOPY;  Service: Cardiovascular;  Laterality: N/A;   History reviewed. No pertinent family history. Social History:  reports that he quit smoking about 4 weeks ago. His smoking use included Cigarettes. He has a 40.00 pack-year smoking history. He has never used smokeless tobacco. He reports that he drinks alcohol. He reports that he uses drugs,  including Marijuana. Allergies:      Allergies  Allergen Reactions   . No Known Allergies          Medications Prior to Admission  Medication Sig Dispense Refill  . oxyCODONE-acetaminophen (PERCOCET/ROXICET) 5-325 MG tablet Take 1-2 tablets by mouth every 6 (six) hours as needed for severe pain. 15 tablet 0  . aspirin 81 MG chewable tablet Chew 1 tablet (81 mg total) by mouth daily. (Patient not taking: Reported on 11/10/2016) 30 tablet 0  . atorvastatin (LIPITOR) 40 MG tablet Take 1 tablet (40 mg total) by mouth daily at 6 PM. (Patient not taking: Reported on 11/10/2016) 30 tablet 0  . HYDROcodone-acetaminophen (NORCO/VICODIN) 5-325 MG tablet Take 1-2 tablets by mouth every 4 (four) hours as needed for moderate pain. (Patient not taking: Reported on 11/10/2016) 30 tablet 0    Home: Home Living Family/patient expects to be discharged to:: Private residence Living Arrangements: Alone  Functional History: Functional Status:  Mobility:  ADL:  Cognition: Cognition Orientation Level: Oriented X4  Blood pressure (!) 145/72, pulse 81, temperature (!) 101.4 F (38.6 C), temperature source Oral, resp. rate 18, height  (1.905 m), weight 108.9 kg (240 lb), SpO2 95 %. Physical Exam  Vitals reviewed. HENT:  Head: Normocephalic.  Eyes: EOM are normal.  Neck: Normal range of motion. Neck supple. No thyromegaly present.  Cardiovascular: Normal rate and regular rhythm.   Respiratory: Effort normal and breath sounds normal. No respiratory distress.  GI: Soft. Bowel sounds are normal. He exhibits no distension.  Neurological:  Alert.. Follows simple commands. Provides his name and age and place. Moves all 4's. Can move right thigh against gravity  Skin:  Amputation site dressed appropriately tender  Psychiatric:  Quiet, sometime irritable    Lab Results Last 24 Hours       Results for orders placed or performed during the hospital encounter of 11/10/16 (from the past 24 hour(s))  Glucose, capillary     Status: Abnormal   Collection Time:  11/15/16 11:34 AM  Result Value Ref Range   Glucose-Capillary 108 (H) 65 - 99 mg/dL  Glucose, capillary     Status: Abnormal   Collection Time: 11/15/16  3:04 PM  Result Value Ref Range   Glucose-Capillary 103 (H) 65 - 99 mg/dL  Basic metabolic panel     Status: Abnormal   Collection Time: 11/16/16  2:54 AM  Result Value Ref Range   Sodium 138 135 - 145 mmol/L   Potassium 3.8 3.5 - 5.1 mmol/L   Chloride 101 101 - 111 mmol/L   CO2 26 22 - 32 mmol/L   Glucose, Bld 121 (H) 65 - 99 mg/dL   BUN 5 (L) 6 - 20 mg/dL   Creatinine, Ser 1.61 0.61 - 1.24 mg/dL   Calcium 9.0 8.9 - 09.6 mg/dL   GFR calc non Af Amer >60 >60 mL/min   GFR calc Af Amer >60 >60 mL/min   Anion gap 11 5 - 15  CBC     Status: Abnormal   Collection Time: 11/16/16  2:54 AM  Result Value Ref Range   WBC 9.0 4.0 - 10.5 K/uL   RBC 3.82 (L) 4.22 - 5.81 MIL/uL   Hemoglobin 11.6 (L) 13.0 - 17.0 g/dL   HCT 04.5 (L) 40.9 - 81.1 %   MCV 90.3 78.0 - 100.0 fL   MCH 30.4 26.0 - 34.0 pg   MCHC 33.6 30.0 - 36.0 g/dL   RDW 91.4 78.2 - 95.6 %  Platelets 175 150 - 400 K/uL     Imaging Results (Last 48 hours)  No results found.    Assessment/Plan: Diagnosis: Right AKA 1. Does the need for close, 24 hr/day medical supervision in concert with the patient's rehab needs make it unreasonable for this patient to be served in a less intensive setting? Yes 2. Co-Morbidities requiring supervision/potential complications: DM2, PAD, abla, htn 3. Due to bladder management, bowel management, safety, skin/wound care, disease management, medication administration, pain management and patient education, does the patient require 24 hr/day rehab nursing? Yes 4. Does the patient require coordinated care of a physician, rehab nurse, PT (1-2 hrs/day, 5 days/week) and OT (1-2 hrs/day, 5 days/week) to address physical and functional deficits in the context of the above medical diagnosis(es)? Yes Addressing deficits in the  following areas: balance, endurance, locomotion, strength, transferring, bowel/bladder control, bathing, dressing, feeding, grooming, toileting and psychosocial support 5. Can the patient actively participate in an intensive therapy program of at least 3 hrs of therapy per day at least 5 days per week? Potentially 6. The potential for patient to make measurable gains while on inpatient rehab is good 7. Anticipated functional outcomes upon discharge from inpatient rehab are modified independent  with PT, modified independent with OT, modified independent with SLP. 8. Estimated rehab length of stay to reach the above functional goals is: potentially 9-13 days 9. Does the patient have adequate social supports and living environment to accommodate these discharge functional goals? Potentially 10. Anticipated D/C setting: Home 11. Anticipated post D/C treatments: HH therapy and Outpatient therapy 12. Overall Rehab/Functional Prognosis: good  RECOMMENDATIONS: This patient's condition is appropriate for continued rehabilitative care in the following setting: CIR Patient has agreed to participate in recommended program. Potentially Note that insurance prior authorization may be required for reimbursement for recommended care.  Comment: I had an EXTENDED conversation with the patient and his family who was present at beside. I explained to them that I would consider inpatient rehab ONLY if there is 100% buy in on his part. During his last admission he was irritable, uncooperative, argumentative with staff leading to an early discharge per the patient's preference. If we bring him back to inpatient rehab, and there are any signs of this type of behavior again, he would be immediately discharged home. Family was in agreement with my assessment and comments and will talk with the patient regarding the next step. Rehab RN to follow up.   [name], MD, [facility] Physical Medicine &  Rehabilitation 11/16/2016    Charlton Amor., PA-C 11/16/2016    Revision History                        Routing History

## 2016-11-17 NOTE — Telephone Encounter (Signed)
-----   Message from Sharee Pimple, RN sent at 11/17/2016 11:19 AM EDT ----- Regarding: 4 weeks for AKA postop   ----- Message ----- From: Dara Lords, PA-C Sent: 11/17/2016  10:55 AM To: Vvs Charge Pool  s/p right AKA.  f/u with Dr. Darrick Penna in 4 weeks.  Thanks

## 2016-11-17 NOTE — Progress Notes (Signed)
Received pt. As a transfer from 2 W.Pt. Was oriented to the unit protocol and routine.Safety plan was explained.Fall prevention plan was explained and signed by the pt. And RN.

## 2016-11-17 NOTE — Telephone Encounter (Signed)
Scheduled 5/3 @ 9am.

## 2016-11-17 NOTE — Discharge Summary (Signed)
[facility] Discharge Summary  Ronald Forbes 05-Jan-1961 56 y.o. male  [id number]  Admission Date: 11/10/2016  Discharge Date: 11/17/16  Physician: Sherren Kerns, MD  Admission Diagnosis: Foot Pain Occluded right lower extremity femoral to popliteal artery bypass graft T82.392A; Right foot pain M79.671  HPI:   This is a 56 y.o. male right fem pt bypass 10 days ago.  Complains of several day history of pain in right foot and swelling. Duplex 3 days ago showed ABI 0.88 but difficult to see bypass.  His pain is now worse.   Other medical problems include Diabetes, hyperlipidemia, obesity, tobacco abuse all currently controlled. His right leg bypass was occluded and Dr. Darrick Penna did not believe that his right foot was salvageable.   Hospital Course:  The patient was admitted to the hospital and taken to the operating room on 11/10/2016 for pain control with plans for right above knee amputation on 11/15/16.    The patient tolerated the procedure well and was transported to the PACU in stable condition.   On POD 1, the patient had a fever of 101.4 without leukocytosis. His CXR was normal without active disease and urinalysis had trace leukocytes without nitrites and elevated WBC. His urine culture was pending. His dressing was changed given some bloody drainage on dressing. His incision was intact without any active drainage. His pain had improved.   POD 2: The patient was wiling to go to rehab. He does have some phantom pain but wanted to hold on initiating neurontin or lyrica at this time.  A bed did become available in CIR and he is transferred.  A retention sock is ordered from [employer].     CBC    Component Value Date/Time   WBC 8.8 11/17/2016 0335   RBC 3.64 (L) 11/17/2016 0335   HGB 11.0 (L) 11/17/2016 0335   HCT 33.0 (L) 11/17/2016 0335   PLT 167 11/17/2016 0335   MCV 90.7 11/17/2016 0335   MCH 30.2 11/17/2016 0335   MCHC 33.3 11/17/2016 0335   RDW 13.0  11/17/2016 0335   LYMPHSABS 1.7 11/10/2016 1028   MONOABS 0.5 11/10/2016 1028   EOSABS 0.1 11/10/2016 1028   BASOSABS 0.0 11/10/2016 1028    BMET    Component Value Date/Time   NA 138 11/17/2016 0335   NA 136 (A) 02/04/2016   K 3.7 11/17/2016 0335   CL 107 11/17/2016 0335   CO2 24 11/17/2016 0335   GLUCOSE 133 (H) 11/17/2016 0335   BUN <5 (L) 11/17/2016 0335   BUN 15 02/04/2016   CREATININE 0.57 (L) 11/17/2016 0335   CALCIUM 8.6 (L) 11/17/2016 0335   GFRNONAA >60 11/17/2016 0335   GFRAA >60 11/17/2016 0335     Discharge Instructions:   The patient is discharged to CIR with extensive instructions on wound care and progressive ambulation.  They are instructed not to drive or perform any heavy lifting until returning to see the physician in his office.  Discharge Instructions    Call MD for:  redness, tenderness, or signs of infection (pain, swelling, bleeding, redness, odor or green/yellow discharge around incision site)    Complete by:  As directed    Call MD for:  severe or increased pain, loss or decreased feeling  in affected limb(s)    Complete by:  As directed    Call MD for:  temperature >100.5    Complete by:  As directed    Discharge wound care:    Complete by:  As directed    Shower daily with soap and water starting 11/18/16   Resume previous diet    Complete by:  As directed       Discharge Diagnosis:  Foot Pain Occluded right lower extremity femoral to popliteal artery bypass graft T82.392A; Right foot pain M79.671  Secondary Diagnosis: Patient Active Problem List   Diagnosis Date Noted  . Reactive hypertension   . Type 2 diabetes mellitus with peripheral neuropathy (HCC)   . Benign essential HTN   . Hypokalemia   . Hypoalbuminemia due to protein-calorie malnutrition (HCC)   . Acute blood loss anemia   . Debilitated 10/28/2016  . Ischemia of right lower extremity 10/25/2016  . PAD (peripheral artery disease) (HCC) 10/20/2016  . Thyroid nodule  02/29/2016  . 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 Cerebrovascular accident (CVA) due to embolism of left posterior cerebral artery (HCC)   . Diabetes mellitus type 2 in obese (HCC) 02/07/2016  . HLD (hyperlipidemia)   . Tobacco abuse 02/04/2016  . Alcohol use disorder (HCC) 02/04/2016  . Morbid obesity (HCC) 02/04/2016  . CVA (cerebral vascular accident) (HCC) 02/04/2016   Past Medical History:  Diagnosis Date  . Alcohol use disorder (HCC)   . Cerebral vascular accident (HCC)   . 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 Diabetes mellitus type 2 in obese (HCC)   . HLD (hyperlipidemia)   . Morbid obesity (HCC)   . Stroke (HCC)   . Tobacco abuse      Allergies as of 11/17/2016      Reactions   No Known Allergies       Medication List    STOP taking these medications   HYDROcodone-acetaminophen 5-325 MG tablet Commonly known as:  NORCO/VICODIN     TAKE these medications   aspirin 81 MG chewable tablet Chew 1 tablet (81 mg total) by mouth daily.   atorvastatin 40 MG tablet Commonly known as:  LIPITOR Take 1 tablet (40 mg total) by mouth daily at 6 PM.   oxyCODONE-acetaminophen 5-325 MG tablet Commonly known as:  PERCOCET/ROXICET Take 1-2 tablets by mouth every 6 (six) hours as needed for severe pain.       No Rx given  Disposition: CIR  Patient's condition: is Good  Follow up: 1. Dr. Darrick Penna in 4 weeks   Doreatha Massed, PA-C [facility] [phone] 11/17/2016  10:55 AM

## 2016-11-17 NOTE — Progress Notes (Signed)
[facility] [place]  Subjective  - having some pain/weakness in amputated right leg   Objective (!) 141/84 72 97.6 F (36.4 C) (Oral) 18 97%  Intake/Output Summary (Last 24 hours) at 11/17/16 0818 Last data filed at 11/17/16 1610  Gross per 24 hour  Intake              720 ml  Output             2650 ml  Net            -1930 ml   Right aka dressing clean Flat affect  Assessment/Planning: AKA healing well Pt states he is willing to try Rehab again Will hold on neurontin or lyrica for now for phantom pain at pt request Ok for d/c to rehab today Will need follow up in 1 month for staple removal  Fabienne Bruns 11/17/2016 8:18 AM --  Laboratory Lab Results:  Recent Labs  11/16/16 0254 11/17/16 0335  WBC 9.0 8.8  HGB 11.6* 11.0*  HCT 34.5* 33.0*  PLT 175 167   BMET  Recent Labs  11/16/16 0254 11/17/16 0335  NA 138 138  K 3.8 3.7  CL 101 107  CO2 26 24  GLUCOSE 121* 133*  BUN 5* <5*  CREATININE 0.71 0.57*  CALCIUM 9.0 8.6*    COAG Lab Results  Component Value Date   INR 1.05 11/07/2016   INR 1.04 10/25/2016   INR 1.01 02/04/2016   No results found for: PTT

## 2016-11-17 NOTE — Care Management Note (Signed)
Case Management Note Previous CM note initiated by Glennon Mac, RN 11/11/2016, 2:41 PM  Patient Details  Name: Ronald Forbes MRN: [id number] Date of Birth: 03/05/1961  Subjective/Objective:  Pt admitted on 11/10/16 with Rt foot pain and swelling s/p RT fem pop BPG 10 days ago.  PTA, pt resided at home with brother.                     Action/Plan: Planning for RT AKA on Monday, 11/15/16.  Will follow for discharge planning postoperatively.    Expected Discharge Date:  11/17/16               Expected Discharge Plan:  IP Rehab Facility  In-House Referral:     Discharge planning Services  CM Consult, [facility] / [facility] (established/new)  Post Acute Care Choice:  NA Choice offered to:  NA  DME Arranged:    DME Agency:     HH Arranged:    HH Agency:     Status of Service:  Completed, signed off  If discussed at [employer] of Stay Meetings, dates discussed:  4/3  Discharge Disposition: IP rehab- CIR   Additional Comments:  11/17/16- 1100- Donn Pierini RN, CM- spoke with Genie from CIR- they have bed available and will offer to pt today- per MD note pt medically ready- CIR to admit pt later today- vascular has been notified and agrees with plan for CIR today. [facility] will f/u with pt prior to discharge from CIR.   Zenda Alpers [place], RN 11/17/2016, 11:40 AM [phone]

## 2016-11-17 NOTE — Anesthesia Postprocedure Evaluation (Addendum)
Anesthesia Post Note  Patient: Ronald Forbes  Procedure(s) Performed: Procedure(s) (LRB): AMPUTATION ABOVE KNEE (Right)  Patient location during evaluation: PACU Anesthesia Type: General Level of consciousness: awake and alert Pain management: pain level controlled Vital Signs Assessment: post-procedure vital signs reviewed and stable Respiratory status: spontaneous breathing, nonlabored ventilation, respiratory function stable and patient connected to nasal cannula oxygen Cardiovascular status: blood pressure returned to baseline and stable Postop Assessment: no signs of nausea or vomiting Anesthetic complications: no       Last Vitals:  Vitals:   11/16/16 2116 11/17/16 0441  BP: (!) 159/77 (!) 141/84  Pulse: 79 72  Resp: 18 18  Temp: 37.1 C 36.4 C    Last Pain:  Vitals:   11/17/16 0546  TempSrc:   PainSc: 0-No pain                 Bonny Vanleeuwen,JAMES TERRILL

## 2016-11-17 NOTE — Progress Notes (Signed)
Orthopedic Tech Progress Note Patient Details:  Ronald Forbes [birth year] [id number]  Patient ID: Ronald Forbes, male   DOB: 1961-01-01, 56 y.o.   MRN: [id number]   Nikki Dom 11/17/2016, 11:15 AM Called in bio-tech brace order; spoke with Judeth Cornfield

## 2016-11-17 NOTE — Progress Notes (Signed)
Trish Mage, RN Rehab Admission Coordinator Signed Physical Medicine and Rehabilitation  PMR Pre-admission Date of Service: 11/17/2016 1:20 PM  Related encounter: ED to Hosp-Admission (Discharged) from 11/10/2016 in [facility] 2W CARDIAC UNIT       Hide copied text PMR Admission Coordinator Pre-Admission Assessment  Patient: Ronald Forbes is an 56 y.o., male MRN: [id number] DOB: May 11, 1961 Height:  (190.5 cm) Weight: 108.9 kg (240 lb)                                                                                                                                                  Insurance Information HMO: No   PPO:       PCP:       IPA:       80/20:       OTHER:   PRIMARY:  Medicaid [place] access      Policy#: [id number]      Subscriber:  [name] CM Name:        Phone#:       Fax#:   Pre-Cert#:        Employer:  Not employed Benefits:  Phone #:  [phone]     Name:  Automated Eff. 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 Patient well known to rehabilitation services from recent admission 10/28/2016-11/01/2016 for right lower extremity bypass grafting and was discharged to home with his sister. Patient was needing ongoing encouragement to partcipatewhile  attending his therapy program. He presented 11/15/2016 with findings of occluded right lower extremity femoral-to-popliteal artery bypass graft. Ongoing ischemic changes to right lower extremity and no change with conservative care. 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 Darrick Penna. Hospital course pain management. Physical and occupational therapy evaluations pending. M.D. has requested physical medicine rehabilitation consult.    Past Medical History      Past Medical History:  Diagnosis Date  . Alcohol use  disorder (HCC)   . Cerebral vascular accident (HCC)   . 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 Diabetes mellitus type 2 in obese (HCC)   . HLD (hyperlipidemia)   . Morbid obesity (HCC)   . Stroke (HCC)   . Tobacco abuse     Family History  family history is not on file.  Prior Rehab/Hospitalizations: Was on CIR after bypass graft 11/01/16  Has the patient had major surgery during 100 days prior to admission? Yes  Current Medications   Current Facility-Administered Medications:  .  0.9 %  sodium chloride infusion, 250 mL, Intravenous, PRN, Sherren Kerns, MD, Last Rate: 10 mL/hr at 11/15/16 1719, 250 mL at 11/15/16 1719 .  acetaminophen (TYLENOL) tablet 325-650 mg, 325-650 mg, Oral, Q4H PRN, 650 mg at 11/16/16 0529 **OR** acetaminophen (TYLENOL) suppository 325-650 mg, 325-650 mg, Rectal, Q4H PRN, Sherren Kerns, MD .  alum & mag hydroxide-simeth (MAALOX/MYLANTA) 200-200-20 MG/5ML suspension 15-30 mL, 15-30 mL, Oral, Q2H PRN, Sherren Kerns, MD .  bisacodyl (DULCOLAX) suppository 10 mg, 10 mg, Rectal, Daily PRN, Fransisco Hertz, MD .  docusate sodium (COLACE) capsule 100 mg, 100 mg, Oral, BID, Sherren Kerns, MD, 100 mg at 11/17/16 0906 .  guaiFENesin-dextromethorphan (ROBITUSSIN DM) 100-10 MG/5ML syrup 15 mL, 15 mL, Oral, Q4H PRN, Sherren Kerns, MD .  heparin injection 5,000 Units, 5,000 Units, Subcutaneous,  Q8H, Sherren Kerns, MD .  hydrALAZINE (APRESOLINE) injection 5 mg, 5 mg, Intravenous, Q20 Min PRN, Sherren Kerns, MD .  HYDROcodone-acetaminophen (NORCO/VICODIN) 5-325 MG per tablet 1-2 tablet, 1-2 tablet, Oral, Q4H PRN, Raymond Gurney, PA-C, 2 tablet at 11/17/16 1310 .  labetalol (NORMODYNE,TRANDATE) injection 10 mg, 10 mg, Intravenous, Q10 min PRN, Sherren Kerns, MD .  magnesium sulfate IVPB 2 g 50 mL, 2 g, Intravenous, Daily PRN, Raymond Gurney, PA-C .  metoprolol (LOPRESSOR) injection 2-5 mg, 2-5 mg, Intravenous, Q2H PRN, Sherren Kerns, MD .  pantoprazole (PROTONIX) EC tablet 40 mg, 40 mg, Oral, Daily, Sherren Kerns, MD, 40 mg at 11/16/16 0834 .  phenol (CHLORASEPTIC) mouth spray 1 spray, 1 spray, Mouth/Throat, PRN, Sherren Kerns, MD .  potassium chloride SA (K-DUR,KLOR-CON) CR tablet 20-40 mEq, 20-40 mEq, Oral, Daily PRN, Raymond Gurney, PA-C .  senna (SENOKOT) tablet 8.6 mg, 1 tablet, Oral, BID PRN, Fransisco Hertz, MD, 8.6 mg at 11/14/16 0942 .  sodium chloride flush (NS) 0.9 % injection 3 mL, 3 mL, Intravenous, Q12H, Sherren Kerns, MD, 3 mL at 11/16/16 2200 .  sodium chloride flush (NS) 0.9 % injection 3 mL, 3 mL, Intravenous, PRN, Sherren Kerns, MD, 3 mL at 11/14/16 0943 .  sodium phosphate (FLEET) 7-19 GM/118ML enema 1 enema, 1 enema, Rectal, Daily PRN, Fransisco Hertz, MD  Patients Current Diet: Diet regular Room service appropriate? Yes; Fluid consistency: Thin  Precautions / Restrictions Precautions Precautions: Fall Precaution Comments: h/o R hemi due to CVA in June 2017 Restrictions Weight Bearing Restrictions: Yes RLE Weight Bearing: Non weight bearing   Has the patient had 2 or more falls or a fall with injury in the past year?No  Prior Activity Level Household: Homebound most recently.  Went out 2-3 X this past month  Journalist, newspaper / Equipment Home Assistive Devices/Equipment: Cane (specify quad or straight) Home Equipment: Cane -  single point  Prior Device Use: Indicate devices/aids used by the patient prior to current  illness, exacerbation or injury? Cane  Prior Functional Level Prior Function Level of Independence: Independent  Self Care: Did the patient need help bathing, dressing, using the toilet or eating?  Independent  Indoor Mobility: Did the patient need assistance with walking from room to room (with or without device)? Independent  Stairs: Did the patient need assistance with internal or external stairs (with or without device)? Independent  Functional Cognition: Did the patient need help planning regular tasks such as shopping or remembering to take medications? Independent  Current Functional Level Cognition  Overall Cognitive Status: No family/caregiver present to determine baseline cognitive functioning Orientation Level: Oriented X4 Following Commands: Follows one step commands with increased time Safety/Judgement: Decreased awareness of safety General Comments: Poor awareness of deficits and relation to mobility.     Extremity Assessment (includes Sensation/Coordination)  Upper Extremity Assessment: Defer to OT evaluation RUE Deficits / Details: limited RUE use due to residual weakness and limited range from prior CVA RUE Coordination: decreased fine motor, decreased gross motor  Lower Extremity Assessment: LLE deficits/detail LLE Deficits / Details: Good overall functional strength.     ADLs  Overall ADL's : Needs assistance/impaired Eating/Feeding: Independent, Sitting Grooming: Set up, Sitting Upper Body Bathing: Supervision/ safety, Set up, Sitting Lower Body Bathing: Moderate assistance (+2 min A sit<>stand from bed; +2 Mod A from recliner) Upper Body Dressing : Set up, Supervision/safety, Sitting Lower Body Dressing: Moderate assistance Lower Body Dressing Details (indicate cue type and reason): +2 min A sit<>stand from bed; +2 Mod A from recliner Toilet Transfer  Details (indicate cue type and reason): +2 min A sit<>stand from bed; +2 Mod A from recliner Toileting- Clothing Manipulation and Hygiene: Moderate assistance (+2 min A sit<>stand from bed; +2 Mod A from recliner)    Mobility  Overal bed mobility: Needs Assistance Bed Mobility: Supine to Sit Supine to sit: Mod assist General bed mobility comments: min guard A to scoot to EOB with cues    Transfers  Overall transfer level: Needs assistance Equipment used: Rolling walker (2 wheeled) Transfers: Sit to/from Stand Sit to Stand: Min assist, From elevated surface (+2 min assist from elevated bed, +2 mod assist from chair) Stand pivot transfers: Min assist, +2 physical assistance General transfer comment: cues for sequence and assist with Rt UE plaement on rw.     Ambulation / Gait / Stairs / Wheelchair Mobility  Ambulation/Gait Ambulation/Gait assistance: +2 physical assistance, Mod assist Ambulation Distance (Feet): 2 Feet Assistive device: Rolling walker (2 wheeled) Gait Pattern/deviations:  (hop-to) General Gait Details: pt sliding LLE forward X2 and back. Unable to fully unweight for swing-to step.  Gait velocity: slow sequence    Posture / Balance Balance Overall balance assessment: Needs assistance Sitting-balance support: No upper extremity supported Sitting balance-Leahy Scale: Good Standing balance support: Bilateral upper extremity supported, During functional activity Standing balance-Leahy Scale: Poor Standing balance comment: reliance on UE support    Special needs/care consideration BiPAP/CPAP No CPM No Continuous Drip IV No Dialysis No       Life Vest No Oxygen No Special Bed No Trach Size No Wound Vac (area) No     Skin Has new right AKA incision with dressing                              Bowel mgmt: Last documented BM 11/16/16 Bladder mgmt: Using urinal and BRP with assistance Diabetic mgmt Yes, says he is a new diabetic  Previous Home  Environment Living Arrangements: Alone  Lives With: Alone Available Help at Discharge: Family (reports sister can come and live with him. When asked if she works he said,  "Yes, but she felt like she was going to lose her job, and she might already have but I haven't talked to her lately") Type of Home: Apartment Home Layout: One level Home Access: Level entry Bathroom Shower/Tub: Engineer, manufacturing systems: Standard Home Care Services: No Additional Comments: no family present to confirm if family available to assist following D/C.   Discharge Living Setting Plans for Discharge Living Setting: Alone (Plans to go home alone.) Type of Home at Discharge: Apartment Discharge Home Layout: One level (Bottom level apartment.) Discharge Home Access: Level entry Does the patient have any problems obtaining your medications?: No  Social/Family/Support Systems Patient Roles: Other (Comment) (Has a brother and sister in law in [place]) Contact Information: Onnie Boer - brother - [phone] Anticipated Caregiver: self, brother and sister in law Anticipated Caregiver's Contact Information: Gaylord Shih - sister in law [phone] Ability/Limitations of Caregiver: Brother can check on patient intermittently Caregiver Availability: Intermittent Discharge Plan Discussed with Primary Caregiver: Yes Is Caregiver In Agreement with Plan?: Yes Does Caregiver/Family have Issues with Lodging/Transportation while Pt is in Rehab?: No  Goals/Additional Needs Patient/Family Goal for Rehab: PT/OT mod I goals Expected length of stay: 9-13 days Cultural Considerations: None Dietary Needs: Regular diet, thin liquids Equipment Needs: TBD Special Service Needs: New diagnosis of diabetic this admission. Pt/Family Agrees to Admission and willing to participate: Yes Program Orientation Provided & Reviewed with Pt/Caregiver Including Roles  & Responsibilities: Yes  Decrease burden of Care  through IP rehab admission: N/A  Possible need for SNF placement upon discharge: Not anticipated  Patient Condition: This patient's condition remains as documented in the consult dated 11/16/16, in which the Rehabilitation Physician determined and documented that the patient's condition is appropriate for intensive rehabilitative care in an inpatient rehabilitation facility. Will admit to inpatient rehab today.  Preadmission Screen Completed By:  Trish Mage, 11/17/2016 1:28 PM ______________________________________________________________________   Discussed status with Dr. Allena Katz on 11/17/16 at 1328 and received telephone approval for admission today.  Admission Coordinator:  Trish Mage, time 1328/Date 11/17/16       Cosigned by: Ankit Karis Juba, MD at 11/17/2016 2:29 PM  Revision History

## 2016-11-17 NOTE — H&P (Signed)
Physical Medicine and Rehabilitation Admission H&P    Chief Complaint  Patient presents with  . 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     HPI:  Ronald Forbes is a 56 y.o. male with history of CVA with residual right sided weakness, morbid obesity, ongoing tobacco and alcohol abuse, PVD s/p femoral to anterior tibial bypass graft 10/25/16 who was readmitted on 11/10/16 with right foot pain and swelling. He was found to have occluded right leg bypass and was admitted for pain control and amputation recommended. He underwent R-AKA on 4/2 by Dr. Oneida Alar and therapy initiated. Patient with deficits in mobility as well as decrease in ability to carry out ADL tasks. CIR recommended for follow up therapy.   [employer] consulted for retention sock.    Review of Systems  HENT: Negative for hearing loss.   Eyes: Negative for blurred vision and double vision.  Respiratory: Negative for cough and shortness of breath.   Cardiovascular: Negative for chest pain and palpitations.  Gastrointestinal: Negative for constipation, heartburn and nausea.  Genitourinary: Negative for dysuria and urgency.  Musculoskeletal: Negative for back pain, joint pain and myalgias.  Neurological: Positive for sensory change (at BKA site). Negative for focal weakness.  Psychiatric/Behavioral: The patient is nervous/anxious. The patient does not have insomnia.   All other systems reviewed and are negative.     Past Medical History:  Diagnosis Date  . Alcohol use disorder ([place])   . Cerebral vascular accident ([place])   . 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 Diabetes mellitus type 2 in obese ([place])   . HLD (hyperlipidemia)   . Morbid obesity ([place])   . Stroke ([place])   . Tobacco abuse     Past Surgical History:  Procedure Laterality Date  . ABDOMINAL AORTOGRAM W/LOWER EXTREMITY Right 10/20/2016   Procedure: Abdominal Aortogram w/Lower Extremity;  Surgeon: Waynetta Sandy, MD;  Location: [facility];  Service: Cardiovascular;  Laterality: Right;  lower leg  . AMPUTATION Right 11/15/2016   Procedure: AMPUTATION ABOVE KNEE;  Surgeon: Elam Dutch, MD;  Location: [facility];  Service: Vascular;  Laterality: Right;  . EP IMPLANTABLE DEVICE N/A 02/09/2016   Procedure: Loop Recorder Insertion;  Surgeon: Deboraha Sprang, MD;  Location: [facility];  Service: Cardiovascular;  Laterality: N/A;  . FEMORAL-TIBIAL BYPASS GRAFT Right 10/25/2016   Procedure: BYPASS GRAFT FEMORAL-TIBIAL ARTERY USING NON REVERSED SAPPHENOUS VEIN;  Surgeon: Elam Dutch, MD;  Location: [facility];  Service: Vascular;  Laterality: Right;  . INTRAOPERATIVE ARTERIOGRAM Right 10/25/2016   Procedure: INTRA OPERATIVE ARTERIOGRAM;  Surgeon: Elam Dutch, MD;  Location: [facility];  Service: Vascular;  Laterality: Right;  . TEE WITHOUT CARDIOVERSION N/A 02/06/2016   Procedure: TRANSESOPHAGEAL ECHOCARDIOGRAM (TEE);  Surgeon: Josue Hector, MD;  Location: [facility];  Service: Cardiovascular;  Laterality: N/A;    History reviewed. No pertinent family history.    Social History:  Lives with sister. He reports that he has cut down to one pack every 1-2 days.  He has a 40.00 pack-year smoking history. He has never used smokeless tobacco. He reports that he quit alchol about 6 moths ago. He reports that he uses drugs, including Marijuana.    Allergies  Allergen Reactions  . No Known Allergies     Medications Prior to Admission  Medication Sig Dispense Refill  . oxyCODONE-acetaminophen (PERCOCET/ROXICET) 5-325 MG tablet Take 1-2 tablets by mouth every  6 (six) hours as needed for severe pain. 15 tablet 0  . aspirin 81 MG chewable tablet Chew 1 tablet (81 mg total) by mouth daily. (Patient not taking: Reported on 11/10/2016) 30 tablet 0  . atorvastatin (LIPITOR) 40 MG tablet Take 1 tablet (40 mg total) by mouth daily at 6 PM. (Patient not taking: Reported on 11/10/2016) 30 tablet 0    . HYDROcodone-acetaminophen (NORCO/VICODIN) 5-325 MG tablet Take 1-2 tablets by mouth every 4 (four) hours as needed for moderate pain. (Patient not taking: Reported on 11/10/2016) 30 tablet 0    Home: [place] expects to be discharged to:: Unsure Living Arrangements: Alone Available Help at Discharge: Family (reports sister can come and live with him. When asked if she works he said,  "Yes, but she felt like she was going to lose her job, and she might already have but I haven't talked to her lately") Type of Home: Apartment Home Access: Level entry Home Layout: One level Bathroom Shower/Tub: Chiropodist: Standard Home Equipment: Cane - single point Additional Comments: no family present to confirm if family available to assist following D/C.   Lives With: Alone   Functional History: Prior Function Level of Independence: Independent  Functional Status:  Mobility: Bed Mobility Overal bed mobility: Needs Assistance Bed Mobility: Supine to Sit Supine to sit: Mod assist General bed mobility comments: min guard A to scoot to EOB with cues Transfers Overall transfer level: Needs assistance Equipment used: Rolling walker (2 wheeled) Transfers: Sit to/from Stand Sit to Stand: Min assist, From elevated surface (+2 min assist from elevated bed, +2 mod assist from chair) Stand pivot transfers: Min assist, +2 physical assistance General transfer comment: cues for sequence and assist with Rt UE plaement on rw.  Ambulation/Gait Ambulation/Gait assistance: +2 physical assistance, Mod assist Ambulation Distance (Feet): 2 Feet Assistive device: Rolling walker (2 wheeled) Gait Pattern/deviations:  (hop-to) General Gait Details: pt sliding LLE forward X2 and back. Unable to fully unweight for swing-to step.  Gait velocity: slow sequence    ADL: ADL Overall ADL's : Needs assistance/impaired Eating/Feeding: Independent, Sitting Grooming: Set up,  Sitting Upper Body Bathing: Supervision/ safety, Set up, Sitting Lower Body Bathing: Moderate assistance (+2 min A sit<>stand from bed; +2 Mod A from recliner) Upper Body Dressing : Set up, Supervision/safety, Sitting Lower Body Dressing: Moderate assistance Lower Body Dressing Details (indicate cue type and reason): +2 min A sit<>stand from bed; +2 Mod A from recliner Toilet Transfer Details (indicate cue type and reason): +2 min A sit<>stand from bed; +2 Mod A from recliner Toileting- Clothing Manipulation and Hygiene: Moderate assistance (+2 min A sit<>stand from bed; +2 Mod A from recliner)  Cognition: Cognition Overall Cognitive Status: No family/caregiver present to determine baseline cognitive functioning Orientation Level: Oriented X4 Cognition Arousal/Alertness: Awake/alert Behavior During Therapy: Flat affect Overall Cognitive Status: No family/caregiver present to determine baseline cognitive functioning Area of Impairment: Safety/judgement Following Commands: Follows one step commands with increased time Safety/Judgement: Decreased awareness of safety Problem Solving: Difficulty sequencing, Requires verbal cues General Comments: Poor awareness of deficits and relation to mobility.     Blood pressure (!) 141/84, pulse 72, temperature 97.6 F (36.4 C), temperature source Oral, resp. rate 18, height 6' 3"  (1.905 m), weight 108.9 kg (240 lb), SpO2 97 %. Physical Exam  Vitals reviewed. Constitutional: He is oriented to person, place, and time. He appears well-developed and well-nourished.  HENT:  Head: Normocephalic and atraumatic.  Eyes: Conjunctivae and EOM are normal.  Pupils are equal, round, and reactive to light.  Neck: Normal range of motion. Neck supple.  Cardiovascular: Normal rate and regular rhythm.   Respiratory: Effort normal and breath sounds normal.  GI: Soft. Bowel sounds are normal. He exhibits no distension.  Musculoskeletal: He exhibits edema and  tenderness.  R-AKA with dry compressive dressing. Moves BUE and LLE without difficulty.   Neurological: He is alert and oriented to person, place, and time.  Flat affect.  Able to follow commands without difficulty.  Motor: LUE/LLE: 5/5 RUE: 4/5 RLE: HF 3/5  Skin: Skin is warm and dry.  Psychiatric: He has a normal mood and affect. His behavior is normal.    Results for orders placed or performed during the hospital encounter of 11/10/16 (from the past 48 hour(s))  Glucose, capillary     Status: Abnormal   Collection Time: 11/15/16  3:04 PM  Result Value Ref Range   Glucose-Capillary 103 (H) 65 - 99 mg/dL  Basic metabolic panel     Status: Abnormal   Collection Time: 11/16/16  2:54 AM  Result Value Ref Range   Sodium 138 135 - 145 mmol/L   Potassium 3.8 3.5 - 5.1 mmol/L   Chloride 101 101 - 111 mmol/L   CO2 26 22 - 32 mmol/L   Glucose, Bld 121 (H) 65 - 99 mg/dL   BUN 5 (L) 6 - 20 mg/dL   Creatinine, Ser 0.71 0.61 - 1.24 mg/dL   Calcium 9.0 8.9 - 10.3 mg/dL   GFR calc non Af Amer >60 >60 mL/min   GFR calc Af Amer >60 >60 mL/min    Comment: (NOTE) The eGFR has been calculated using the CKD EPI equation. This calculation has not been validated in all clinical situations. eGFR's persistently <60 mL/min signify possible Chronic Kidney Disease.    Anion gap 11 5 - 15  CBC     Status: Abnormal   Collection Time: 11/16/16  2:54 AM  Result Value Ref Range   WBC 9.0 4.0 - 10.5 K/uL   RBC 3.82 (L) 4.22 - 5.81 MIL/uL   Hemoglobin 11.6 (L) 13.0 - 17.0 g/dL   HCT 34.5 (L) 39.0 - 52.0 %   MCV 90.3 78.0 - 100.0 fL   MCH 30.4 26.0 - 34.0 pg   MCHC 33.6 30.0 - 36.0 g/dL   RDW 13.0 11.5 - 15.5 %   Platelets 175 150 - 400 K/uL  Basic metabolic panel     Status: Abnormal   Collection Time: 11/17/16  3:35 AM  Result Value Ref Range   Sodium 138 135 - 145 mmol/L   Potassium 3.7 3.5 - 5.1 mmol/L   Chloride 107 101 - 111 mmol/L   CO2 24 22 - 32 mmol/L   Glucose, Bld 133 (H) 65 - 99  mg/dL   BUN <5 (L) 6 - 20 mg/dL   Creatinine, Ser 0.57 (L) 0.61 - 1.24 mg/dL   Calcium 8.6 (L) 8.9 - 10.3 mg/dL   GFR calc non Af Amer >60 >60 mL/min   GFR calc Af Amer >60 >60 mL/min    Comment: (NOTE) The eGFR has been calculated using the CKD EPI equation. This calculation has not been validated in all clinical situations. eGFR's persistently <60 mL/min signify possible Chronic Kidney Disease.    Anion gap 7 5 - 15  CBC     Status: Abnormal   Collection Time: 11/17/16  3:35 AM  Result Value Ref Range   WBC 8.8 4.0 - 10.5 K/uL  RBC 3.64 (L) 4.22 - 5.81 MIL/uL   Hemoglobin 11.0 (L) 13.0 - 17.0 g/dL   HCT 33.0 (L) 39.0 - 52.0 %   MCV 90.7 78.0 - 100.0 fL   MCH 30.2 26.0 - 34.0 pg   MCHC 33.3 30.0 - 36.0 g/dL   RDW 13.0 11.5 - 15.5 %   Platelets 167 150 - 400 K/uL   Dg Chest Port 1 View  Result Date: 11/16/2016 CLINICAL DATA:  Fever. EXAM: PORTABLE CHEST 1 VIEW COMPARISON:  None. FINDINGS: The heart size and mediastinal contours are within normal limits. Both lungs are clear. The visualized skeletal structures are unremarkable. Loop recorder in place.  - IMPRESSION: No active disease. Electronically Signed   By: Lorriane Shire M.D.   On: 11/16/2016 10:55       Medical Problem List and Plan: 1.  Gait abnormality, limitations with transfers secondary to right AKA. 2.  DVT Prophylaxis/Anticoagulation: Pharmaceutical: Lovenox 3. Pain Management: hydrocodone effective. Add low dose gabapentin for neuropathy 4. Mood: LCSW to follow for evaluation and support.  5. Neuropsych: This patient is  capable of making decisions on his own behalf. 6. Skin/Wound Care: monitor wound daily for healing.  7. Fluids/Electrolytes/Nutrition: Monitor I/O. Check lytes in am.  8. New diagnosis DM:  Hgb A1c- 6.6. Educate patient on CM diet. Monitor BS ac/hs and use SSI for elevated BS and to promote wound healing.  9. ABLA: add iron supplement.    Post Admission Physician  Evaluation: 1. Preadmission assessment reviewed and changes made below. 2. Functional deficits secondary  to right AKA. 3. Patient is admitted to receive collaborative, interdisciplinary care between the physiatrist, rehab nursing staff, and therapy team. 4. Patient's level of medical complexity and substantial therapy needs in context of that medical necessity cannot be provided at a lesser intensity of care such as a SNF. 5. Patient has experienced substantial functional loss from his/her baseline which was documented above under the "Functional History" and "Functional Status" headings.  Judging by the patient's diagnosis, physical exam, and functional history, the patient has potential for functional progress which will result in measurable gains while on inpatient rehab.  These gains will be of substantial and practical use upon discharge  in facilitating mobility and self-care at the household level. 6. Physiatrist will provide 24 hour management of medical needs as well as oversight of the therapy plan/treatment and provide guidance as appropriate regarding the interaction of the two. 7. The Preadmission Screening has been reviewed and patient status is unchanged unless otherwise stated above. 8. 24 hour rehab nursing will assist with bladder management, safety, skin/wound care, disease management, pain management and patient education  and help integrate therapy concepts, techniques,education, etc. 9. PT will assess and treat for/with: Lower extremity strength, range of motion, stamina, balance, functional mobility, safety, adaptive techniques and equipment, woundcare, coping skills, pain control, pre-prosthetic education.   Goals are: Supervision. 10. OT will assess and treat for/with: ADL's, functional mobility, safety, upper extremity strength, adaptive techniques and equipment, wound mgt, ego support, and community reintegration.   Goals are: Mod I. Therapy may not proceed with showering this  patient. 11. Case Management and Social Worker will assess and treat for psychological issues and discharge planning. 12. Team conference will be held weekly to assess progress toward goals and to determine barriers to discharge. 13. Patient will receive at least 3 hours of therapy per day at least 5 days per week. 14. ELOS: 7-11 days.       15.  Prognosis:  good  Delice Lesch, MD, [address] [place] 11/17/2016

## 2016-11-17 NOTE — PMR Pre-admission (Signed)
PMR Admission Coordinator Pre-Admission Assessment  Patient: Ronald Forbes is an 56 y.o., male MRN: [id number] DOB: [birth year] Height:  (190.5 cm) Weight: 108.9 kg (240 lb)              Insurance Information HMO: No   PPO:       PCP:       IPA:       80/20:       OTHER:   PRIMARY:  Medicaid [place] access      Policy#: [id number]      Subscriber:  [name] CM Name:        Phone#:       Fax#:   Pre-Cert#:        Employer:  Not employed Benefits:  Phone #:  [phone]     Name:  Automated Eff. Date:  Eligible 11/16/16 with coverage code [facility]     Deduct:        Out of Pocket Max:        Life Max:   CIR:        SNF:   Outpatient:       Co-Pay:   Home Health:        Co-Pay:   DME:       Co-Pay:   Providers:    Emergency Contact Information Contact Information    Name Relation Home Work Mobile   [place] Brother [phone]  [phone]   Toni Arthurs   [phone]     Current Medical History  Patient Admitting Diagnosis:  R AKA  History of Present Illness: A 56 y.o. 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 Patient well known to rehabilitation services from recent admission 10/28/2016-11/01/2016 for right lower extremity bypass grafting and was discharged to home with his sister. Patient was needing ongoing encouragement to partcipate while attending his therapy program. He presented 11/15/2016 with findings of occluded right lower extremity femoral-to-popliteal artery bypass graft. Ongoing ischemic changes to right lower extremity and no change with conservative care. 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 Darrick Penna. Hospital course pain management. Physical and occupational therapy evaluations pending. M.D. has requested physical medicine rehabilitation consult.    Past Medical  History  Past Medical History:  Diagnosis Date  . Alcohol use disorder (HCC)   . Cerebral vascular accident (HCC)   . 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 Diabetes mellitus type 2 in obese (HCC)   . HLD (hyperlipidemia)   . Morbid obesity (HCC)   . Stroke (HCC)   . Tobacco abuse     Family History  family history is not on file.  Prior Rehab/Hospitalizations: Was on CIR after bypass graft 11/01/16  Has the patient had major surgery during 100 days prior to admission? Yes  Current Medications   Current Facility-Administered Medications:  .  0.9 %  sodium chloride infusion, 250 mL, Intravenous, PRN, Sherren Kerns, MD, Last Rate: 10 mL/hr at 11/15/16 1719, 250 mL at 11/15/16 1719 .  acetaminophen (TYLENOL) tablet 325-650 mg, 325-650 mg, Oral, Q4H PRN, 650 mg at 11/16/16 0529 **OR** acetaminophen (TYLENOL) suppository 325-650 mg, 325-650 mg, Rectal, Q4H PRN, Sherren Kerns, MD .  alum & mag hydroxide-simeth (MAALOX/MYLANTA) 200-200-20 MG/5ML suspension 15-30 mL, 15-30 mL, Oral, Q2H PRN, Sherren Kerns, MD .  bisacodyl (DULCOLAX)  suppository 10 mg, 10 mg, Rectal, Daily PRN, Fransisco Hertz, MD .  docusate sodium (COLACE) capsule 100 mg, 100 mg, Oral, BID, Sherren Kerns, MD, 100 mg at 11/17/16 0906 .  guaiFENesin-dextromethorphan (ROBITUSSIN DM) 100-10 MG/5ML syrup 15 mL, 15 mL, Oral, Q4H PRN, Sherren Kerns, MD .  heparin injection 5,000 Units, 5,000 Units, Subcutaneous, Q8H, Sherren Kerns, MD .  hydrALAZINE (APRESOLINE) injection 5 mg, 5 mg, Intravenous, Q20 Min PRN, Sherren Kerns, MD .  HYDROcodone-acetaminophen (NORCO/VICODIN) 5-325 MG per tablet 1-2 tablet, 1-2 tablet, Oral, Q4H PRN, Raymond Gurney, PA-C, 2 tablet at 11/17/16 1310 .  labetalol (NORMODYNE,TRANDATE) injection 10 mg, 10 mg, Intravenous, Q10 min PRN, Sherren Kerns, MD .  magnesium sulfate IVPB 2 g 50 mL, 2 g, Intravenous, Daily PRN, Raymond Gurney, PA-C .  metoprolol (LOPRESSOR) injection 2-5 mg, 2-5 mg, Intravenous, Q2H PRN, Sherren Kerns, MD .  pantoprazole (PROTONIX) EC tablet 40 mg, 40 mg, Oral, Daily, Sherren Kerns, MD, 40 mg at 11/16/16 0834 .  phenol (CHLORASEPTIC) mouth spray 1 spray, 1 spray, Mouth/Throat, PRN, Sherren Kerns, MD .  potassium chloride SA (K-DUR,KLOR-CON) CR tablet 20-40 mEq, 20-40 mEq, Oral, Daily PRN, Raymond Gurney, PA-C .  senna (SENOKOT) tablet 8.6 mg, 1 tablet, Oral, BID PRN, Fransisco Hertz, MD, 8.6 mg at 11/14/16 0942 .  sodium chloride flush (NS) 0.9 % injection 3 mL, 3 mL, Intravenous, Q12H, Sherren Kerns, MD, 3 mL at 11/16/16 2200 .  sodium chloride flush (NS) 0.9 % injection 3 mL, 3 mL, Intravenous, PRN, Sherren Kerns, MD, 3 mL at 11/14/16 0943 .  sodium phosphate (FLEET) 7-19 GM/118ML enema 1 enema, 1 enema, Rectal, Daily PRN, Fransisco Hertz, MD  Patients Current Diet: Diet regular Room service appropriate? Yes; Fluid consistency: Thin  Precautions / Restrictions Precautions Precautions: Fall Precaution Comments: h/o R hemi due to CVA in June 2017 Restrictions Weight Bearing Restrictions: Yes RLE Weight Bearing: Non weight bearing   Has the patient had 2 or more falls or a fall with injury in the past year?No  Prior Activity Level Household: Homebound most recently.  Went out 2-3 X this past month  Journalist, newspaper / Equipment Home Assistive Devices/Equipment: Cane (specify quad or straight) Home Equipment: Cane - single point  Prior Device Use: Indicate devices/aids used by the patient prior to current illness, exacerbation or injury? Cane  Prior Functional Level Prior Function Level of Independence: Independent  Self Care: Did the patient need help bathing, dressing, using the toilet or eating?  Independent  Indoor Mobility: Did the patient need assistance with walking from room to room (with or without device)? Independent  Stairs: Did the patient need  assistance with internal or external stairs (with or without device)? Independent  Functional Cognition: Did the patient need help planning regular tasks such as shopping or remembering to take medications? Independent  Current Functional Level Cognition  Overall Cognitive Status: No family/caregiver present to determine baseline cognitive functioning Orientation Level: Oriented X4 Following Commands: Follows one step commands with increased time Safety/Judgement: Decreased awareness of safety General Comments: Poor awareness of deficits and relation to mobility.     Extremity Assessment (includes Sensation/Coordination)  Upper Extremity Assessment: Defer to OT evaluation RUE Deficits / Details: limited RUE use due to residual weakness and limited range from prior CVA RUE Coordination: decreased fine motor, decreased gross motor  Lower Extremity Assessment: LLE deficits/detail LLE Deficits / Details: Good overall  functional strength.     ADLs  Overall ADL's : Needs assistance/impaired Eating/Feeding: Independent, Sitting Grooming: Set up, Sitting Upper Body Bathing: Supervision/ safety, Set up, Sitting Lower Body Bathing: Moderate assistance (+2 min A sit<>stand from bed; +2 Mod A from recliner) Upper Body Dressing : Set up, Supervision/safety, Sitting Lower Body Dressing: Moderate assistance Lower Body Dressing Details (indicate cue type and reason): +2 min A sit<>stand from bed; +2 Mod A from recliner Toilet Transfer Details (indicate cue type and reason): +2 min A sit<>stand from bed; +2 Mod A from recliner Toileting- Clothing Manipulation and Hygiene: Moderate assistance (+2 min A sit<>stand from bed; +2 Mod A from recliner)    Mobility  Overal bed mobility: Needs Assistance Bed Mobility: Supine to Sit Supine to sit: Mod assist General bed mobility comments: min guard A to scoot to EOB with cues    Transfers  Overall transfer level: Needs assistance Equipment used: Rolling  walker (2 wheeled) Transfers: Sit to/from Stand Sit to Stand: Min assist, From elevated surface (+2 min assist from elevated bed, +2 mod assist from chair) Stand pivot transfers: Min assist, +2 physical assistance General transfer comment: cues for sequence and assist with Rt UE plaement on rw.     Ambulation / Gait / Stairs / Wheelchair Mobility  Ambulation/Gait Ambulation/Gait assistance: +2 physical assistance, Mod assist Ambulation Distance (Feet): 2 Feet Assistive device: Rolling walker (2 wheeled) Gait Pattern/deviations:  (hop-to) General Gait Details: pt sliding LLE forward X2 and back. Unable to fully unweight for swing-to step.  Gait velocity: slow sequence    Posture / Balance Balance Overall balance assessment: Needs assistance Sitting-balance support: No upper extremity supported Sitting balance-Leahy Scale: Good Standing balance support: Bilateral upper extremity supported, During functional activity Standing balance-Leahy Scale: Poor Standing balance comment: reliance on UE support    Special needs/care consideration BiPAP/CPAP No CPM No Continuous Drip IV No Dialysis No       Life Vest No Oxygen No Special Bed No Trach Size No Wound Vac (area) No     Skin Has new right AKA incision with dressing                              Bowel mgmt: Last documented BM 11/16/16 Bladder mgmt: Using urinal and BRP with assistance Diabetic mgmt Yes, says he is a new diabetic    Previous Home Environment Living Arrangements: Alone  Lives With: Alone Available Help at Discharge: Family (reports sister can come and live with him. When asked if she works he said,  "Yes, but she felt like she was going to lose her job, and she might already have but I haven't talked to her lately") Type of Home: Apartment Home Layout: One level Home Access: Level entry Bathroom Shower/Tub: Engineer, manufacturing systems: Standard Home Care Services: No Additional Comments: no family present to  confirm if family available to assist following D/C.   Discharge Living Setting Plans for Discharge Living Setting: Alone (Plans to go home alone.) Type of Home at Discharge: Apartment Discharge Home Layout: One level (Bottom level apartment.) Discharge Home Access: Level entry Does the patient have any problems obtaining your medications?: No  Social/Family/Support Systems Patient Roles: Other (Comment) (Has a brother and sister in law in [place]) Contact Information: Onnie Boer - brother - [phone] Anticipated Caregiver: self, brother and sister in law Anticipated Caregiver's Contact Information: Gaylord Shih - sister in law [phone] Ability/Limitations of Caregiver: Brother  can check on patient intermittently Caregiver Availability: Intermittent Discharge Plan Discussed with Primary Caregiver: Yes Is Caregiver In Agreement with Plan?: Yes Does Caregiver/Family have Issues with Lodging/Transportation while Pt is in Rehab?: No  Goals/Additional Needs Patient/Family Goal for Rehab: PT/OT mod I goals Expected length of stay: 9-13 days Cultural Considerations: None Dietary Needs: Regular diet, thin liquids Equipment Needs: TBD Special Service Needs: New diagnosis of diabetic this admission. Pt/Family Agrees to Admission and willing to participate: Yes Program Orientation Provided & Reviewed with Pt/Caregiver Including Roles  & Responsibilities: Yes  Decrease burden of Care through IP rehab admission: N/A  Possible need for SNF placement upon discharge: Not anticipated  Patient Condition: This patient's condition remains as documented in the consult dated 11/16/16, in which the Rehabilitation Physician determined and documented that the patient's condition is appropriate for intensive rehabilitative care in an inpatient rehabilitation facility. Will admit to inpatient rehab today.  Preadmission Screen Completed By:  Trish Mage, 11/17/2016 1:28  PM ______________________________________________________________________   Discussed status with Dr. Allena Katz on 11/17/16 at 1328 and received telephone approval for admission today.  Admission Coordinator:  Trish Mage, time 1328/Date 11/17/16

## 2016-11-18 ENCOUNTER — Inpatient Hospital Stay (HOSPITAL_COMMUNITY): Payer: Medicaid Other | Admitting: Occupational Therapy

## 2016-11-18 ENCOUNTER — Inpatient Hospital Stay (HOSPITAL_COMMUNITY): Payer: Medicaid Other | Admitting: Physical Therapy

## 2016-11-18 DIAGNOSIS — Z89611 Acquired absence of right leg above knee: Secondary | ICD-10-CM

## 2016-11-18 DIAGNOSIS — I69359 Hemiplegia and hemiparesis following cerebral infarction affecting unspecified side: Secondary | ICD-10-CM

## 2016-11-18 LAB — CBC WITH DIFFERENTIAL/PLATELET
Basophils Absolute: 0 10*3/uL (ref 0.0–0.1)
Basophils Relative: 0 %
Eosinophils Absolute: 0.2 10*3/uL (ref 0.0–0.7)
Eosinophils Relative: 2 %
HCT: 34.3 % — ABNORMAL LOW (ref 39.0–52.0)
HEMOGLOBIN: 11.5 g/dL — AB (ref 13.0–17.0)
LYMPHS ABS: 1.8 10*3/uL (ref 0.7–4.0)
Lymphocytes Relative: 25 %
MCH: 30.3 pg (ref 26.0–34.0)
MCHC: 33.5 g/dL (ref 30.0–36.0)
MCV: 90.5 fL (ref 78.0–100.0)
MONOS PCT: 8 %
Monocytes Absolute: 0.6 10*3/uL (ref 0.1–1.0)
NEUTROS PCT: 65 %
Neutro Abs: 4.8 10*3/uL (ref 1.7–7.7)
Platelets: 169 10*3/uL (ref 150–400)
RBC: 3.79 MIL/uL — AB (ref 4.22–5.81)
RDW: 13.2 % (ref 11.5–15.5)
WBC: 7.3 10*3/uL (ref 4.0–10.5)

## 2016-11-18 LAB — COMPREHENSIVE METABOLIC PANEL
ALBUMIN: 2.8 g/dL — AB (ref 3.5–5.0)
ALT: 54 U/L (ref 17–63)
ANION GAP: 9 (ref 5–15)
AST: 40 U/L (ref 15–41)
Alkaline Phosphatase: 80 U/L (ref 38–126)
BILIRUBIN TOTAL: 0.7 mg/dL (ref 0.3–1.2)
BUN: 6 mg/dL (ref 6–20)
CHLORIDE: 104 mmol/L (ref 101–111)
CO2: 24 mmol/L (ref 22–32)
Calcium: 8.7 mg/dL — ABNORMAL LOW (ref 8.9–10.3)
Creatinine, Ser: 0.56 mg/dL — ABNORMAL LOW (ref 0.61–1.24)
GFR calc Af Amer: >60 mL/min (ref >60)
GFR calc non Af Amer: >60 mL/min (ref >60)
GLUCOSE: 128 mg/dL — AB (ref 65–99)
POTASSIUM: 3.7 mmol/L (ref 3.5–5.1)
SODIUM: 137 mmol/L (ref 135–145)
Total Protein: 6.2 g/dL — ABNORMAL LOW (ref 6.5–8.1)

## 2016-11-18 LAB — GLUCOSE, CAPILLARY
GLUCOSE-CAPILLARY: 137 mg/dL — AB (ref 65–99)
GLUCOSE-CAPILLARY: 147 mg/dL — AB (ref 65–99)
Glucose-Capillary: 114 mg/dL — ABNORMAL HIGH (ref 65–99)
Glucose-Capillary: 125 mg/dL — ABNORMAL HIGH (ref 65–99)

## 2016-11-18 NOTE — Progress Notes (Signed)
Social Work Assessment and Plan Social Work Assessment and Plan  Patient Details  Name: Ronald Forbes MRN: [id number] Date of Birth: Jul 15, 1961  Today's Date: 11/18/2016  Problem List:  Patient Active Problem List   Diagnosis Date Noted  . 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 Unilateral AKA, right (HCC)   . Diabetic peripheral neuropathy (HCC)   . Diabetes mellitus type 2 in nonobese (HCC)   . Reactive hypertension   . Type 2 diabetes mellitus with peripheral neuropathy (HCC)   . Benign essential HTN   . Hypokalemia   . Hypoalbuminemia due to protein-calorie malnutrition (HCC)   . Acute blood loss anemia   . Debilitated 10/28/2016  . Ischemia of right lower extremity 10/25/2016  . PAD (peripheral artery disease) (HCC) 10/20/2016  . Thyroid nodule 02/29/2016  . 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 Cerebrovascular accident (CVA) due to embolism of left posterior cerebral artery (HCC)   . Diabetes mellitus type 2 in obese (HCC) 02/07/2016  . HLD (hyperlipidemia)   . Tobacco abuse 02/04/2016  . Alcohol use disorder (HCC) 02/04/2016  . Morbid obesity (HCC) 02/04/2016  . CVA (cerebral vascular accident) (HCC) 02/04/2016   Past Medical History:  Past Medical History:  Diagnosis Date  . Alcohol use disorder (HCC)   . Cerebral vascular accident (HCC)   . 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 Diabetes mellitus type 2 in obese (HCC)   . HLD (hyperlipidemia)   . Morbid obesity (HCC)   . Stroke (HCC)   . Tobacco abuse    Past Surgical History:  Past Surgical History:  Procedure Laterality Date  . ABDOMINAL AORTOGRAM W/LOWER EXTREMITY Right 10/20/2016   Procedure: Abdominal Aortogram w/Lower Extremity;  Surgeon: Maeola Harman, MD;  [facility] INVASIVE CV LAB;  Service: Cardiovascular;  Laterality: Right;  lower leg  . AMPUTATION Right 11/15/2016   Procedure: AMPUTATION ABOVE KNEE;  Surgeon:  Sherren Kerns, MD;  Location: [facility] OR;  Service: Vascular;  Laterality: Right;  . EP IMPLANTABLE DEVICE N/A 02/09/2016   Procedure: Loop Recorder Insertion;  Surgeon: Duke Salvia, MD;  Location: [facility] INVASIVE CV LAB;  Service: Cardiovascular;  Laterality: N/A;  . FEMORAL-TIBIAL BYPASS GRAFT Right 10/25/2016   Procedure: BYPASS GRAFT FEMORAL-TIBIAL ARTERY USING NON REVERSED SAPPHENOUS VEIN;  Surgeon: Sherren Kerns, MD;  Location: [facility] OR;  Service: Vascular;  Laterality: Right;  . INTRAOPERATIVE ARTERIOGRAM Right 10/25/2016   Procedure: INTRA OPERATIVE ARTERIOGRAM;  Surgeon: Sherren Kerns, MD;  Location: [facility] OR;  Service: Vascular;  Laterality: Right;  . TEE WITHOUT CARDIOVERSION N/A 02/06/2016   Procedure: TRANSESOPHAGEAL ECHOCARDIOGRAM (TEE);  Surgeon: Wendall Stade, MD;  Location: [facility] ENDOSCOPY;  Service: Cardiovascular;  Laterality: N/A;   Social History:  reports that he quit smoking about 5 weeks ago. His smoking use included Cigarettes. He has a 40.00 pack-year smoking history. He has never used smokeless tobacco. He reports that he drinks alcohol. He reports that he uses drugs, including Marijuana.  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 He doesn't want to bother anyone nor put them out. Many times he goes without so as not to ask for assist. Hopefully [name] will move in with him to assist him at discharge.  Social History Preferred language: English Religion:  Cultural Background:  No issues Education: Trade school-mechanic Read: Yes Write: Yes Employment Status: Disabled Date Retired/Disabled/Unemployed:  after CVA Legal Hisotry/Current Legal Issues: No issues Guardian/Conservator: None-according to MD pt is capable of making his own decisions, very stubborn and does what he wants.   Abuse/Neglect Physical Abuse: Denies Verbal Abuse: Denies Sexual Abuse: Denies Exploitation of patient/patient's resources: Denies Self-Neglect: Denies  Emotional Status Pt's affect, behavior adn adjustment status: Pt is better this time around and not filled with such anger. He is having pain and when he does he does not want to do anything-therapies. He plans on being mod/i and able to do for himself. Discussed he needs to pre-medicate and go to therapies to maximize rehab. Recent Psychosocial Issues: R-hemiparesis from previous CVA 01/2016. Pain issues from toe that now has been amputated Pyschiatric History: No history feel he would benefit from seeing neuro-psych may talk more to a male then a male. Comes across as angry but feel he is scared and doesn't want to ask for help due to has no one there to assist him.  Follow to provide support and for coping Substance Abuse History: Tobacco aware needs to quit smoking and feels ETOH is social, can stop when he wants too. Aware of resources in the community to assist with this  Patient / Family Perceptions, Expectations & Goals Pt/Family understanding of illness & functional limitations: Pt can explain his AKA and somewhat relieved surgery is over but did not think would have this much pain after. Is pre-medicating prior to therapy. He does talk with the MD and feels he has a good understanding of his condition. Premorbid pt/family roles/activities: Sibling and friend Anticipated changes in roles/activities/participation: resume Pt/family expectations/goals: Pt states: " I want to go home soon."  Left message for his brother awaiting  return call  [employer]: Other (Comment) ([employer] 01/2016-05/2016) Premorbid Home Care/DME Agencies: Other (Comment) (has cane and tub bench) Transportation available at discharge: Pt drives, unsure if he will upon discharge from rehab Resource referrals recommended: Neuropsychology, Support group (specify)  Discharge Planning Living Arrangements: Alone Support Systems: Other relatives, Friends/neighbors Type of Residence: Private residence Insurance Resources: Medicaid (specify county) Surveyor, quantity Resources: SSI Financial Screen Referred: No Living Expenses: Psychologist, sport and exercise Management: Patient Does the patient have any problems obtaining your medications?: No Home Management: Slef and now sister to assist Patient/Family Preliminary Plans: Return to apartment with sister moving in with him, to assist him. His brother and sister in-law come every so often to check on him. He needs to be mod/i so in case he is home alone he is safe. Try to contact sister to verify her coming to assist him. Social Work Anticipated Follow Up Needs: HH/OP, Support Group  Clinical Impression Pt is more pleasant this time on rehab, he is having pain with his amputation and requesting pain meds every 1-2 hours. He reports his sister is moving in with him to assist him, unsure when coming and has not Spoken with her, which makes this worker wonder if she actually is coming. Will try to contact her myself if I can obtain her number from pt. He has limited supports and limited access to services due to his medicaid and the Guidelines they  have. Will work on making sure has PCP appointment and any community resources he may be eligible for arranged prior to discharge. Will also has neuro-psych see while here.  [name] 11/18/2016, 2:07 PM

## 2016-11-18 NOTE — Progress Notes (Signed)
Subjective/Complaints: Slept poor, stump and phantom pain  ROS- neg CP, SOB, - N/V/D  Objective: Vital Signs: Blood pressure (!) 159/89, pulse 71, temperature 97.7 F (36.5 C), temperature source Oral, resp. rate 17, height  (1.905 m), weight 99.5 kg (219 lb 4.8 oz), SpO2 99 %. Dg Chest Port 1 View  Result Date: 11/16/2016 CLINICAL DATA:  Fever. EXAM: PORTABLE CHEST 1 VIEW COMPARISON:  None. FINDINGS: The heart size and mediastinal contours are within normal limits. Both lungs are clear. The visualized skeletal structures are unremarkable. Loop recorder in place.  - IMPRESSION: No active disease. Electronically Signed   By: Francene Boyers M.D.   On: 11/16/2016 10:55   Results for orders placed or performed during the hospital encounter of 11/17/16 (from the past 72 hour(s))  Glucose, capillary     Status: Abnormal   Collection Time: 11/17/16  9:10 PM  Result Value Ref Range   Glucose-Capillary 144 (H) 65 - 99 mg/dL     HEENT: normal Cardio: RRR and no murmur Resp: CTA B/L and unlabored GI: BS positive and NT, ND Extremity:  BS positive and NT, ND Skin:   Wound C/D/I and Right AKA staples intact, serous drainage on dressing no four odor Neuro: Confused, Cranial Nerve II-XII normal, Normal Sensory, Abnormal Motor 3- delt, 4- Right bi, tri grip, 5/5 LUE and LLE, Abnormal FMC Ataxic/ dec FMC, Dysarthric and Aphasic Musc/Skel:  Other Left AKA, RIght shoulder contracture Gen NAD,  mood appears depressed   Assessment/Plan: 1. 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 Physiatrist is providing close team supervision and 24 hour management of active medical problems listed below. Physiatrist and rehab team continue to assess barriers to discharge/monitor patient progress toward functional and medical goals. FIM:                    Function - Comprehension Comprehension: Auditory Comprehension assist level: Understands basic 90% of the time/cues < 10% of the time  Function - Expression Expression: Verbal Expression assist level: Expresses basic 75 - 89% of the time/requires cueing 10 - 24% of the time. Needs helper to occlude trach/needs to repeat words.  Function - Social Interaction Social Interaction assist level: Interacts appropriately 75 - 89% of the time - Needs redirection for appropriate language or to initiate interaction.  Function - Problem Solving Problem solving assist level: Solves basic 75 - 89% of the time/requires cueing 10 - 24% of the time  Function - Memory Memory assist level: Recognizes or recalls 75 - 89% of the time/requires cueing 10 - 24% of the time Patient normally able to recall (first 3 days only): Current season, Location of own room, Staff names and faces, That he or she is in a hospital  Medical Problem List and Plan: 1.  Gait abnormality, limitations with transfers secondary to right AKA. Initiate PT, OT, SLP 2.  DVT Prophylaxis/Anticoagulation: Pharmaceutical: Lovenox 3. Pain Management: hydrocodone effective. Add low dose gabapentin for neuropathy 4. Mood: LCSW to follow for evaluation and support.ask  neuropsych to eval  5. Neuropsych: This patient is  capable of making decisions on his own behalf. 6. Skin/Wound Care: monitor wound daily for healing.  7. Fluids/Electrolytes/Nutrition: Monitor I/O. Check lytes in am.  8. New diagnosis DM:  Hgb A1c- 6.6. Educate patient on CM diet. Monitor BS ac/hs and use SSI for elevated BS and to  promote wound healing.  9. ABLA: add iron supplement.  10.  Hx CVA, Left PCA infarct  June 2017 with chronic R HP, aphasia LOS (Days) 1 A FACE TO FACE EVALUATION WAS PERFORMED  Ronald Forbes 11/18/2016, 5:41 AM

## 2016-11-18 NOTE — Evaluation (Signed)
Occupational Therapy Assessment and Plan  Patient Details  Name: Ronald Forbes MRN: [id number] Date of Birth: 03/28/1961  OT Diagnosis: acute pain and muscle weakness (generalized) Rehab Potential: Rehab Potential (ACUTE ONLY): Good ELOS: 5-7 days   Today's Date: 11/18/2016  Session 1 OT Individual Time: 8563-1497 OT Individual Time Calculation (min): 33 min     Session 2 OT Individual Time: 1300-1400 OT Individual Time Calculation (min): 60 min     Problem List:  Patient Active Problem List   Diagnosis Date Noted  . 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 Unilateral AKA, right ([place])   . Diabetic peripheral neuropathy ([place])   . Diabetes mellitus type 2 in nonobese (H[facility])   . Reactive hypertension   . Type 2 diabetes mellitus with peripheral neuropathy ([facility])   . Benign essential HTN   . Hypokalemia   . Hypoalbuminemia due to protein-calorie malnutrition ([place])   . Acute blood loss anemia   . Debilitated 10/28/2016  . Ischemia of right lower extremity 10/25/2016  . PAD (peripheral artery disease) ([place]) 10/20/2016  . Thyroid nodule 02/29/2016  . 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 Diabetes mellitus type 2 in obese ([place]) 02/07/2016  . HLD (hyperlipidemia)   . Tobacco abuse 02/04/2016  . Alcohol use disorder ([place]) 02/04/2016  . Morbid obesity ([place]) 02/04/2016  . CVA (cerebral vascular accident) ([place]) 02/04/2016    Past Medical History:  Past Medical History:  Diagnosis Date  . Alcohol use disorder ([place])   . Cerebral vascular accident ([place])   . Cerebrovascular accident (CVA) due to thrombosis of left posterior cerebral artery ([place])   . Diabetes mellitus type 2 in obese ([place])   . HLD (hyperlipidemia)   . Morbid obesity ([place])   . Stroke ([place])   . Tobacco abuse    Past Surgical History:  Past Surgical History:  Procedure Laterality Date  . ABDOMINAL  AORTOGRAM W/LOWER EXTREMITY Right 10/20/2016   Procedure: Abdominal Aortogram w/Lower Extremity;  Surgeon: Waynetta Sandy, MD;  Location: [facility];  Service: Cardiovascular;  Laterality: Right;  lower leg  . AMPUTATION Right 11/15/2016   Procedure: AMPUTATION ABOVE KNEE;  Surgeon: Elam Dutch, MD;  Location: [facility];  Service: Vascular;  Laterality: Right;  . EP IMPLANTABLE DEVICE N/A 02/09/2016   Procedure: Loop Recorder Insertion;  Surgeon: Deboraha Sprang, MD;  Location: [facility];  Service: Cardiovascular;  Laterality: N/A;  . FEMORAL-TIBIAL BYPASS GRAFT Right 10/25/2016   Procedure: BYPASS GRAFT FEMORAL-TIBIAL ARTERY USING NON REVERSED SAPPHENOUS VEIN;  Surgeon: Elam Dutch, MD;  Location: [facility];  Service: Vascular;  Laterality: Right;  . INTRAOPERATIVE ARTERIOGRAM Right 10/25/2016   Procedure: INTRA OPERATIVE ARTERIOGRAM;  Surgeon: Elam Dutch, MD;  Location: [facility];  Service: Vascular;  Laterality: Right;  . TEE WITHOUT CARDIOVERSION N/A 02/06/2016   Procedure: TRANSESOPHAGEAL ECHOCARDIOGRAM (TEE);  Surgeon: Josue Hector, MD;  [facility];  Service: Cardiovascular;  Laterality: N/A;    Assessment & Plan Clinical Impression: Patient is a 56 y.o. year old male with history of CVA with residual right sided weakness, morbid obesity, ongoing tobacco and alcohol abuse, PVD s/p femoral to anterior tibial bypass graft 10/25/16 who was readmitted on 11/10/16 with right foot pain and swelling. He was found to have occluded right leg bypass and was admitted for pain control and amputation recommended. He underwent R-AKA on  4/2 by Dr. Oneida Alar and therapy initiated.Patient transferred to CIR on 11/17/2016 .    Patient currently requires mod/max with basic self-care skills secondary to muscle weakness, acute pain, decreased sitting balance, decreased standing balance and decreased balance strategies.  Prior to hospitalization, patient could complete BADL with  modified independent .  Patient will benefit from skilled intervention to increase independence with basic self-care skills prior to discharge home with care partner.  Anticipate patient will require 24 hour supervision and follow up home health.  OT - End of Session Endurance Deficit: Yes Endurance Deficit Description: Extremely limited by R residual limb pain OT Assessment Rehab Potential (ACUTE ONLY): Good OT Patient demonstrates impairments in the following area(s): Balance;Behavior;Motor;Pain;Safety OT Basic ADL's Functional Problem(s): Grooming;Bathing;Toileting;Dressing OT Transfers Functional Problem(s): Toilet;Tub/Shower OT Additional Impairment(s): None (Pt does not want to work on functional use of UE) OT Plan OT Intensity: Minimum of 1-2 x/day, 45 to 90 minutes OT Frequency: 5 out of 7 days OT Duration/Estimated Length of Stay: 5-7 days OT Treatment/Interventions: Medical illustrator training;Community reintegration;Discharge planning;DME/adaptive equipment instruction;Functional mobility training;Pain management;Patient/family education;Self Care/advanced ADL retraining;Psychosocial support;Therapeutic Activities;Therapeutic Exercise;UE/LE Strength taining/ROM;UE/LE Coordination activities OT Basic Self-Care Anticipated Outcome(s): Supervision OT Toileting Anticipated Outcome(s): Supervision OT Bathroom Transfers Anticipated Outcome(s): Supervision  OT Recommendation Recommendations for Other Services: Neuropsych consult Patient destination: Home Follow Up Recommendations: Home health OT;24 hour supervision/assistance Equipment Recommended: 3 in 1 bedside comode;Wheelchair cushion (measurements);Wheelchair (measurements) (likely will need wide drop arm BSC) Equipment Details: Pt has tub bench and RW from previous admission   Skilled Therapeutic Intervention Session 1 OT eval completed addressing rehab process, OT purpose, POC, ELOS, and goals.  Limited ADL assessment  secondary to high pain limiting participation. Pt visibly in pain, but initially pleasant to OT questions and limited bed level eval. Pt agreeable to don underwear, requiring total A to thread B LE's, then able to complete small bridge w/ R residual limb raised and rolling to L for OT to pull pants over hips. Pt became agitated at any further encouragement for ADL participation, using explicit words stating he just wanted to get out of here. OT discussed pain management techniques including distraction and scheduling pain meds around therapy. Provided emotional support and notified RN of pt status.   Session 2 Pt more agreeable to OT this afternoon as RN administered pain meds at bedside. Pt required assistance to thread R residual limb into pant leg 2/2 pain. Sit<>stand with min guard A, R UE used as stabilizer on RW, while pt pulled up pants using L UE. Overall min guard A for dynamic balance. Stand-pivot transfer to wc with min A. One-handed grooming at the sink with VC to locate items on L side of sink. VC for w/c propulsion using L UE and LLE only. Pt brought to tub room and practiced tub bench transfer with min A + vc for technique. Discussed home bathroom set-up and practiced stand-pivot toilet transfer using grab bars for support. Pt does not have grab bars at home and became frustrated at OT suggestions for other safe toilet transfer options. Pt with poor understanding of purpose of rehab despite explanation multiple ways. Pt propelled wc back to room with increased time and min VC for technique. Stand-pivot to return to bed with min guard A. Pt left with needs met.   OT Evaluation Precautions/Restrictions  Precautions Precautions: Fall Precaution Comments: h/o R hemi due to CVA in June 2017 Restrictions Weight Bearing Restrictions: Yes RLE Weight Bearing: Non weight bearing General OT Amount of  Missed Time: 42 Minutes Pain Pain Assessment Pain Assessment: 0-10 Pain Score: 10-Worst pain  ever Pain Type: Surgical pain Pain Location: Leg Pain Orientation: Right Pain Descriptors / Indicators: Burning Pain Frequency: Constant Pain Onset: On-going Patients Stated Pain Goal: 2 Pain Intervention(s): Repositioned;RN made aware Home Living/Prior Functioning Home Living Family/patient expects to be discharged to:: Private residence Living Arrangements: Alone Available Help at Discharge: Family (sister will be living with pt and provide 24/7 assist ) Type of Home: Apartment Home Access: Level entry Home Layout: One level Bathroom Shower/Tub: Chiropodist: Standard Bathroom Accessibility: Yes Additional Comments: Has tub bench and RW  Lives With: Alone IADL History Homemaking Responsibilities: Yes Meal Prep Responsibility: Primary Laundry Responsibility: Primary Cleaning Responsibility: Primary Shopping Responsibility: Primary Prior Function Level of Independence: Independent with basic ADLs, Independent with homemaking with ambulation Driving: Yes ADL ADL ADL Comments: Please see functional navigator Vision/Perception  Vision- History Patient Visual Report: No change from baseline  Cognition Overall Cognitive Status: History of cognitive impairments - at baseline (prior CVA) Arousal/Alertness: Awake/alert Orientation Level: Place;Situation Person: Oriented Place: Oriented Situation: Oriented Year:  (1983) Month: April Day of Week: Correct Memory: Impaired Memory Impairment: Decreased recall of new information Immediate Memory Recall: Sock;Blue;Bed Memory Recall:  (unable to recall with cues) Awareness: Impaired Problem Solving: Impaired Behaviors: Poor frustration tolerance;Impulsive Safety/Judgment: Impaired Comments: Unclear of baseline cognition due to prior CVA. Pt easily frusturated with this PT during session when discussion in regards to ppurpose of CIR and PT goals.  Sensation Sensation Additional Comments: Hypersensitive to  pain in R LE making LB sensation difficult to assess Coordination Gross Motor Movements are Fluid and Coordinated: No Coordination and Movement Description: impaired in RLE/RUE Motor  Motor Motor: Hemiplegia Motor - Skilled Clinical Observations: Difficult to assess 2/2 pain Mobility  Transfers Sit to Stand: 4: Min guard;With armrests Stand to Sit: 5: Supervision;With upper extremity assist  Balance Dynamic Sitting Balance Dynamic Sitting - Level of Assistance: 5: Stand by assistance Static Standing Balance Static Standing - Level of Assistance: 5: Stand by assistance Dynamic Standing Balance Dynamic Standing - Balance Support: During functional activity Dynamic Standing - Level of Assistance: 4: Min assist Extremity/Trunk Assessment RUE Assessment RUE Assessment: Exceptions to [facility] RUE Strength RUE Overall Strength Comments: R hemi from previouse CVA, flexor tone, some AROM shoulder/elbow, weak grasp  Right Hand Gross Grasp: Impaired RUE Tone Modified Ashworth Scale for Grading Hypertonia RUE: More marked increase in muscle tone through most of the ROM, but affected part(s) easily moved LUE Assessment LUE Assessment: Within Functional Limits   See Function Navigator for Current Functional Status.   Refer to Care Plan for Long Term Goals  Recommendations for other services: Neuropsych   Discharge Criteria: Patient will be discharged from OT if patient refuses treatment 3 consecutive times without medical reason, if treatment goals not met, if there is a change in medical status, if patient makes no progress towards goals or if patient is discharged from hospital.  The above assessment, treatment plan, treatment alternatives and goals were discussed and mutually agreed upon: by patient  Valma Cava 11/18/2016, 3:37 PM

## 2016-11-18 NOTE — Care Management Note (Signed)
[facility]patient [facility] Individual Statement of Services  Patient Name:  Ronald Forbes  Date:  11/18/2016  Welcome to the [facility]patient [facility].  Our goal is to provide you with an individualized program based on your diagnosis and situation, designed to meet your specific needs.  With this comprehensive rehabilitation program, you will be expected to participate in at least 3 hours of rehabilitation therapies Monday-Friday, with modified therapy programming on the weekends.  Your rehabilitation program will include the following services:  Physical Therapy (PT), Occupational Therapy (OT), 24 hour per day rehabilitation nursing, Therapeutic Recreaction (TR), Neuropsychology, Case Management (Social Worker), Rehabilitation Medicine, Nutrition Services and Pharmacy Services  Weekly team conferences will be held on Wednesday to discuss your progress.  Your Social Worker will talk with you frequently to get your input and to update you on team discussions.  Team conferences with you and your family in attendance may also be held.  Expected length of stay: 5-7 days Overall anticipated outcome: mod/I-wheelchair level and supervision with ambulation/tub transfers  Depending on your progress and recovery, your program may change. Your Social Worker will coordinate services and will keep you informed of any changes. Your Social Worker's name and contact numbers are listed  below.  The following services may also be recommended but are not provided by the Inpatient [facility]:   Driving Evaluations  Home Health Rehabiltiation Services  Outpatient Rehabilitation Services    Arrangements will be made to provide these services after discharge if needed.  Arrangements include referral to agencies that provide these services.  Your insurance has been verified to be:  Medicaid Your primary doctor is:  [facility]  Pertinent information will be shared with your doctor and  your insurance company.  Social Worker:  Dossie Der, SW [phone] or ([phone]  Information discussed with and copy given to patient by: [name], 11/18/2016, 1:44 PM

## 2016-11-18 NOTE — Evaluation (Signed)
Physical Therapy Assessment and Plan  Patient Details  Name: Ronald Forbes MRN: [id number] Date of Birth: [birth year]  PT Diagnosis: Cognitive deficits, Difficulty walking, Edema, Hemiplegia dominant, Muscle weakness and Pain in R residual limb Rehab Potential: Good ELOS: 5-7 days   Today's Date: 11/18/2016 PT Individual Time: 4818-5631 PT Individual Time Calculation (min): 55 min    Problem List:  Patient Active Problem List   Diagnosis Date Noted  . 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 Unilateral AKA, right ([place])   . Diabetic peripheral neuropathy ([place])   . Diabetes mellitus type 2 in nonobese (HCC)   . Reactive hypertension   . Type 2 diabetes mellitus with peripheral neuropathy (HCC)   . Benign essential HTN   . Hypokalemia   . Hypoalbuminemia due to protein-calorie malnutrition ([place])   . Acute blood loss anemia   . Debilitated 10/28/2016  . Ischemia of right lower extremity 10/25/2016  . PAD (peripheral artery disease) ([place]) 10/20/2016  . Thyroid nodule 02/29/2016  . 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 Diabetes mellitus type 2 in obese ([place]) 02/07/2016  . HLD (hyperlipidemia)   . Tobacco abuse 02/04/2016  . Alcohol use disorder ([place]) 02/04/2016  . Morbid obesity ([place]) 02/04/2016  . CVA (cerebral vascular accident) ([place]) 02/04/2016    Past Medical History:  Past Medical History:  Diagnosis Date  . Alcohol use disorder ([place])   . Cerebral vascular accident ([place])   . Cerebrovascular accident (CVA) due to thrombosis of left posterior cerebral artery ([place])   . Diabetes mellitus type 2 in obese ([place])   . HLD (hyperlipidemia)   . Morbid obesity ([place])   . Stroke ([place])   . Tobacco abuse    Past Surgical History:  Past Surgical History:  Procedure Laterality Date  . ABDOMINAL AORTOGRAM W/LOWER EXTREMITY Right 10/20/2016   Procedure: Abdominal Aortogram  w/Lower Extremity;  Surgeon: Waynetta Sandy, MD;  Location: [facility];  Service: Cardiovascular;  Laterality: Right;  lower leg  . AMPUTATION Right 11/15/2016   Procedure: AMPUTATION ABOVE KNEE;  Surgeon: Elam Dutch, MD;  Location: [facility];  Service: Vascular;  Laterality: Right;  . EP IMPLANTABLE DEVICE N/A 02/09/2016   Procedure: Loop Recorder Insertion;  Surgeon: Deboraha Sprang, MD;  Location: [facility];  Service: Cardiovascular;  Laterality: N/A;  . FEMORAL-TIBIAL BYPASS GRAFT Right 10/25/2016   Procedure: BYPASS GRAFT FEMORAL-TIBIAL ARTERY USING NON REVERSED SAPPHENOUS VEIN;  Surgeon: Elam Dutch, MD;  Location: [facility];  Service: Vascular;  Laterality: Right;  . INTRAOPERATIVE ARTERIOGRAM Right 10/25/2016   Procedure: INTRA OPERATIVE ARTERIOGRAM;  Surgeon: Elam Dutch, MD;  Location: [facility];  Service: Vascular;  Laterality: Right;  . TEE WITHOUT CARDIOVERSION N/A 02/06/2016   Procedure: TRANSESOPHAGEAL ECHOCARDIOGRAM (TEE);  Surgeon: Josue Hector, MD;  Location: [facility];  Service: Cardiovascular;  Laterality: N/A;    Assessment & Plan Clinical Impression: Ronald Forbes a 56 y.o. malewith history of CVA with residual right sided weakness, morbid obesity, ongoing tobacco and alcohol abuse, PVD s/p femoral to anterior tibial bypass graft 10/25/16 who was readmitted on 11/10/16 with right foot pain and swelling. He was found to have occluded right leg bypass and was admitted for pain control and amputation recommended. He underwent R-AKA on 4/2 by Dr. Oneida Alar and therapy initiated. Patient with deficits in mobility as well as decrease in ability to  carry out ADL tasks. CIR recommended for follow up therapy. Patient transferred to CIR on 11/17/2016.   Patient currently requires min with mobility secondary to muscle weakness, muscle joint tightness and muscle paralysis, decreased cardiorespiratoy endurance, impaired timing and sequencing, abnormal tone and  unbalanced muscle activation, decreased awareness, decreased problem solving, decreased safety awareness, decreased memory and delayed processing and decreased sitting balance, decreased standing balance, decreased postural control, hemiplegia and decreased balance strategies.  Prior to hospitalization, patient was modified independent  with mobility and lived with Alone in a [place] home.  Home access is  Level entry.  Patient will benefit from skilled PT intervention to maximize safe functional mobility, minimize fall risk and decrease caregiver burden for planned discharge home with intermittent assist.  Anticipate patient will benefit from follow up [facility] at discharge.  PT - End of Session Activity Tolerance: Decreased this session;Tolerates < 10 min activity, no significant change in vital signs Endurance Deficit: Yes Endurance Deficit Description: fatigued easily with functional mobility PT Assessment Rehab Potential (ACUTE/IP ONLY): Good PT Patient demonstrates impairments in the following area(s): Balance;Behavior;Edema;Endurance;Motor;Nutrition;Pain;Safety;Sensory;Skin Integrity PT Transfers Functional Problem(s): Bed Mobility;Bed to Chair;Car;Furniture PT Locomotion Functional Problem(s): Ambulation;Wheelchair Mobility;Stairs PT Plan PT Intensity: Minimum of 1-2 x/day ,45 to 90 minutes PT Frequency: Total of 15 hours over 7 days of combined therapies PT Duration Estimated Length of Stay: 5-7 days PT Treatment/Interventions: Ambulation/gait training;Balance/vestibular training;Cognitive remediation/compensation;Community reintegration;Discharge planning;Disease management/prevention;DME/adaptive equipment instruction;Functional mobility training;Neuromuscular re-education;Pain management;Patient/family education;Psychosocial support;Skin care/wound management;Stair training;Therapeutic Activities;Therapeutic Exercise;UE/LE Strength taining/ROM;UE/LE Coordination activities;Wheelchair  propulsion/positioning PT Transfers Anticipated Outcome(s): mod I PT Locomotion Anticipated Outcome(s): supervision household ambulator, mod I w/c level PT Recommendation Recommendations for Other Services: Neuropsych consult;Therapeutic Recreation consult Therapeutic Recreation Interventions: Stress management;Kitchen group Follow Up Recommendations: Home health PT Patient destination: Home Equipment Recommended: Wheelchair cushion (measurements);Wheelchair (measurements);Rolling walker with 5" wheels Equipment Details: pt has [facility]  Skilled Therapeutic Intervention Skilled therapeutic intervention initiated after completion of evaluation with encouragement due to decreased patient participation. Discussed with patient falls risk, safety within room, focus of therapy during stay, possible length of stay, goals, and follow-up therapy. Patient requires min A overall using RW, see details below. Patient left semi reclined in bedd with all needs in reach and bed alarm on. Patient requesting to return to bed early due to fatigue, missed 20 min skilled physical therapy.    PT Evaluation Precautions/Restrictions Precautions Precautions: Fall Precaution Comments: h/o R hemi due to CVA in June 2017 Restrictions Weight Bearing Restrictions: Yes RLE Weight Bearing: Non weight bearing General Chart Reviewed: Yes PT Amount of Missed Time (min): 20 Minutes PT Missed Treatment Reason: Patient fatigue Family/Caregiver Present: No  Pain Pain Assessment Pain Assessment: 0-10 Pain Score: 5  Pain Type: Acute pain Pain Location: Leg Pain Orientation: Right Pain Descriptors / Indicators: Throbbing Pain Frequency: Constant Pain Onset: On-going Patients Stated Pain Goal: 3 Pain Intervention(s): Repositioned;Rest Multiple Pain Sites: No Home Living/Prior Functioning Home Living Available Help at Discharge: Family (sister will be living with pt and provide 24/7 assist ) Type of Home: Apartment Home  Access: Level entry Home Layout: One level Bathroom Shower/Tub: Government social research officer Accessibility: Yes Additional Comments: has [place]  Lives With: Alone Prior Function Level of Independence: Independent with basic ADLs;Independent with homemaking with ambulation;Independent with gait;Independent with transfers;Requires assistive device for independence  Able to Take Stairs?: Yes Driving: Yes Vocation: On disability Leisure: Hobbies-no Comments: "I don't know" Vision/Perception   No change from baseline  Cognition Overall Cognitive Status: History  of cognitive impairments - at baseline (prior CVA) Arousal/Alertness: Awake/alert Memory: Impaired Memory Impairment: Decreased recall of new information Awareness: Impaired Problem Solving: Impaired Behaviors: Poor frustration tolerance;Impulsive Safety/Judgment: Impaired Comments: Unclear of baseline cognition due to prior CVA. Pt easily frusturated with this PT during session when discussion in regards to ppurpose of CIR and PT goals.  Sensation Sensation Light Touch: Impaired Detail Light Touch Impaired Details: Impaired RLE Coordination Gross Motor Movements are Fluid and Coordinated: No Fine Motor Movements are Fluid and Coordinated: No Coordination and Movement Description: baseline R hemi Motor  Motor Motor: Hemiplegia Motor - Skilled Clinical Observations: baseline R hemiplegia  Mobility Bed Mobility Bed Mobility: Supine to Sit;Sit to Supine Supine to Sit: 5: Supervision Sit to Supine: 5: Supervision Transfers Transfers: Yes Sit to Stand: 4: Min guard;With armrests Stand to Sit: 5: Supervision;With upper extremity assist Squat Pivot Transfers: 4: Min guard;With upper extremity assistance Locomotion  Ambulation Ambulation: Yes Ambulation/Gait Assistance: 4: Min assist Ambulation Distance (Feet): 7 Feet Assistive device: Rolling walker Gait Gait: Yes Gait Pattern: Impaired Gait  Pattern: Poor foot clearance - left Gait velocity: decreased Stairs / Additional Locomotion Stairs: No Wheelchair Mobility Wheelchair Mobility: Yes Wheelchair Assistance: 5: Careers information officer: Left lower extremity Wheelchair Parts Management: Supervision/cueing Distance: 150 ft  Trunk/Postural Assessment  Cervical Assessment Cervical Assessment: Within Functional Limits Thoracic Assessment Thoracic Assessment: Exceptions to [facility] (R trunk shortening) Lumbar Assessment Lumbar Assessment: Exceptions to [facility] (posterior tilt) Postural Control Postural Control: Deficits on evaluation Protective Responses: delayed  Balance Dynamic Sitting Balance Dynamic Sitting - Level of Assistance: 5: Stand by assistance Static Standing Balance Static Standing - Level of Assistance: 5: Stand by assistance Dynamic Standing Balance Dynamic Standing - Balance Support: During functional activity Dynamic Standing - Level of Assistance: 4: Min assist Extremity Assessment  RLE Assessment RLE Assessment: Exceptions to [facility] RLE Strength RLE Overall Strength: Deficits;Due to pain;Due to premorbid status RLE Overall Strength Comments: R hip flexion 3-/5 LLE Assessment LLE Assessment: Within Functional Limits   See Function Navigator for Current Functional Status.   Refer to Care Plan for Long Term Goals  Recommendations for other services: Neuropsych and Therapeutic Recreation  Pet therapy, Kitchen group and Stress management  Discharge Criteria: Patient will be discharged from PT if patient refuses treatment 3 consecutive times without medical reason, if treatment goals not met, if there is a change in medical status, if patient makes no progress towards goals or if patient is discharged from hospital.  The above assessment, treatment plan, treatment alternatives and goals were discussed and mutually agreed upon: by patient  Kumari Sculley, Murray Hodgkins 11/18/2016, 1:01 PM

## 2016-11-18 NOTE — Plan of Care (Signed)
Problem: Food- and Nutrition-Related Knowledge Deficit (NB-1.1) Goal: Nutrition education Formal process to instruct or train a patient/client in a skill or to impart knowledge to help patients/clients voluntarily manage or modify food choices and eating behavior to maintain or improve health. Outcome: Completed/Met Date Met: 11/18/16  RD consulted for nutrition education regarding diabetes.   Lab Results  Component Value Date   HGBA1C 6.6 (H) 02/05/2016    RD provided "Carbohydrate Counting for People with Diabetes" handout from the Academy of Nutrition and Dietetics. Discussed different food groups and their effects on blood sugar, emphasizing carbohydrate-containing foods. Provided list of carbohydrates and recommended serving sizes of common foods.  Discussed importance of controlled and consistent carbohydrate intake throughout the day. Provided examples of ways to balance meals/snacks and encouraged intake of high-fiber, whole grain complex carbohydrates. Discussed diabetic friendly drink options. Teach back method used.  Expect good compliance.  Body mass index is 27.34 kg/m. Pt meets criteria for overweight based on current BMI.  Current diet order is carbohydrate modified, patient is consuming approximately 100% of meals at this time. Labs and medications reviewed. No further nutrition interventions warranted at this time. RD contact information provided. If additional nutrition issues arise, please re-consult RD.  Stephanie Craig, MS, RD, LDN Pager # [phone] After hours/ weekend pager # [phone]     

## 2016-11-18 NOTE — Progress Notes (Signed)
Patient information reviewed and entered into eRehab system by Sally Menard, RN, CRRN, PPS Coordinator.  Information including medical coding and functional independence measure will be reviewed and updated through discharge.    

## 2016-11-19 ENCOUNTER — Inpatient Hospital Stay (HOSPITAL_COMMUNITY): Payer: Medicaid Other | Admitting: Occupational Therapy

## 2016-11-19 ENCOUNTER — Inpatient Hospital Stay (HOSPITAL_COMMUNITY): Payer: Medicaid Other

## 2016-11-19 ENCOUNTER — Inpatient Hospital Stay (HOSPITAL_COMMUNITY): Payer: Medicaid Other | Admitting: Physical Therapy

## 2016-11-19 DIAGNOSIS — G546 Phantom limb syndrome with pain: Secondary | ICD-10-CM

## 2016-11-19 LAB — GLUCOSE, CAPILLARY
GLUCOSE-CAPILLARY: 132 mg/dL — AB (ref 65–99)
GLUCOSE-CAPILLARY: 121 mg/dL — AB (ref 65–99)
Glucose-Capillary: 123 mg/dL — ABNORMAL HIGH (ref 65–99)
Glucose-Capillary: 103 mg/dL — ABNORMAL HIGH (ref 65–99)

## 2016-11-19 MED ORDER — GABAPENTIN 100 MG PO CAPS
100.0000 mg | ORAL_CAPSULE | Freq: Two times a day (BID) | ORAL | Status: DC
  Administered 2016-11-19 – 2016-11-22 (×8): 100 mg via ORAL
  Filled 2016-11-19 (×9): qty 1

## 2016-11-19 NOTE — Progress Notes (Addendum)
Social Work Patient ID: Ronald Forbes, male   DOB: [birth year], 56 y.o.   MRN: [id number]  Team feels will be here until next Wed have met with pt to give him the heads up and to talk with his sister Regarding the timing of his discharge. Offered to contact his sister to let her know and he declined this. This worker feels pt will be going home alone at discharge. Discussed equipment needs ie rolling walker and wheelchair, he only has a cane. He does not want a bedside commode. Asked if sister could come in prior to discharge to see him in therapies. He did not answer this question. Follow up with him on Monday.

## 2016-11-19 NOTE — Progress Notes (Signed)
Subjective/Complaints: Slept fair,no stump and + phantom pain  ROS- neg CP, SOB, - N/V/D   Objective: Vital Signs: Blood pressure (!) 132/99, pulse 66, temperature 98.7 F (37.1 C), temperature source Oral, resp. rate 16, height 6' 3"  (1.905 m), weight 99.2 kg (218 lb 11.1 oz), SpO2 99 %. No results found. Results for orders placed or performed during the hospital encounter of 11/17/16 (from the past 72 hour(s))  Glucose, capillary     Status: Abnormal   Collection Time: 11/17/16  9:10 PM  Result Value Ref Range   Glucose-Capillary 144 (H) 65 - 99 mg/dL  CBC WITH DIFFERENTIAL     Status: Abnormal   Collection Time: 11/18/16  5:57 AM  Result Value Ref Range   WBC 7.3 4.0 - 10.5 K/uL   RBC 3.79 (L) 4.22 - 5.81 MIL/uL   Hemoglobin 11.5 (L) 13.0 - 17.0 g/dL   HCT 34.3 (L) 39.0 - 52.0 %   MCV 90.5 78.0 - 100.0 fL   MCH 30.3 26.0 - 34.0 pg   MCHC 33.5 30.0 - 36.0 g/dL   RDW 13.2 11.5 - 15.5 %   Platelets 169 150 - 400 K/uL   Neutrophils Relative % 65 %   Neutro Abs 4.8 1.7 - 7.7 K/uL   Lymphocytes Relative 25 %   Lymphs Abs 1.8 0.7 - 4.0 K/uL   Monocytes Relative 8 %   Monocytes Absolute 0.6 0.1 - 1.0 K/uL   Eosinophils Relative 2 %   Eosinophils Absolute 0.2 0.0 - 0.7 K/uL   Basophils Relative 0 %   Basophils Absolute 0.0 0.0 - 0.1 K/uL  Comprehensive metabolic panel     Status: Abnormal   Collection Time: 11/18/16  5:57 AM  Result Value Ref Range   Sodium 137 135 - 145 mmol/L   Potassium 3.7 3.5 - 5.1 mmol/L   Chloride 104 101 - 111 mmol/L   CO2 24 22 - 32 mmol/L   Glucose, Bld 128 (H) 65 - 99 mg/dL   BUN 6 6 - 20 mg/dL   Creatinine, Ser 0.56 (L) 0.61 - 1.24 mg/dL   Calcium 8.7 (L) 8.9 - 10.3 mg/dL   Total Protein 6.2 (L) 6.5 - 8.1 g/dL   Albumin 2.8 (L) 3.5 - 5.0 g/dL   AST 40 15 - 41 U/L   ALT 54 17 - 63 U/L   Alkaline Phosphatase 80 38 - 126 U/L   Total Bilirubin 0.7 0.3 - 1.2 mg/dL   GFR calc non Af Amer >60 >60 mL/min   GFR calc Af Amer >60 >60 mL/min   Comment: (NOTE) The eGFR has been calculated using the CKD EPI equation. This calculation has not been validated in all clinical situations. eGFR's persistently <60 mL/min signify possible Chronic Kidney Disease.    Anion gap 9 5 - 15  Glucose, capillary     Status: Abnormal   Collection Time: 11/18/16  6:31 AM  Result Value Ref Range   Glucose-Capillary 114 (H) 65 - 99 mg/dL  Glucose, capillary     Status: Abnormal   Collection Time: 11/18/16 11:28 AM  Result Value Ref Range   Glucose-Capillary 125 (H) 65 - 99 mg/dL   Comment 1 Notify RN   Glucose, capillary     Status: Abnormal   Collection Time: 11/18/16  4:23 PM  Result Value Ref Range   Glucose-Capillary 137 (H) 65 - 99 mg/dL   Comment 1 Notify RN   Glucose, capillary     Status: Abnormal  Collection Time: 11/18/16  9:18 PM  Result Value Ref Range   Glucose-Capillary 147 (H) 65 - 99 mg/dL  Glucose, capillary     Status: Abnormal   Collection Time: 11/19/16  6:46 AM  Result Value Ref Range   Glucose-Capillary 123 (H) 65 - 99 mg/dL     HEENT: normal Cardio: RRR and no murmur Resp: CTA B/L and unlabored GI: BS positive and NT, ND Extremity:  BS positive and NT, ND Skin:   Wound C/D/I and Right AKA staples intact, no blood, +serous drainage on dressing no four odor Neuro: Confused, Cranial Nerve II-XII normal, Normal Sensory, Abnormal Motor 3- delt, 4- Right bi, tri grip, 5/5 LUE and LLE, Abnormal FMC Ataxic/ dec FMC, Dysarthric and Aphasic Musc/Skel:  Other Left AKA, RIght shoulder contracture Gen NAD,  mood appears depressed   Assessment/Plan: 1. 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 Physiatrist is providing close team supervision and 24 hour management of active medical problems listed below. Physiatrist and rehab team continue to assess barriers to  discharge/monitor patient progress toward functional and medical goals. FIM: Function - Bathing Bathing activity did not occur: Refused  Function- Upper Body Dressing/Undressing What is the patient wearing?: Pull over shirt/dress Pull over shirt/dress - Perfomed by patient: Thread/unthread right sleeve, Thread/unthread left sleeve, Put head through opening, Pull shirt over trunk Assist Level: Set up, Supervision or verbal cues Set up : To obtain clothing/put away Function - Lower Body Dressing/Undressing What is the patient wearing?: Non-skid slipper socks, Pants, Underwear Position: Sitting EOB Underwear - Performed by helper: Thread/unthread right underwear leg, Thread/unthread left underwear leg, Pull underwear up/down Pants- Performed by patient: Thread/unthread right pants leg Pants- Performed by helper: Thread/unthread left pants leg, Pull pants up/down Non-skid slipper socks- Performed by patient: Don/doff left sock Assist for lower body dressing: Touching or steadying assistance (Pt > 75%)  Function - Toileting Toileting steps completed by patient: Adjust clothing prior to toileting, Performs perineal hygiene, Adjust clothing after toileting Toileting Assistive Devices: Grab bar or rail Assist level: Touching or steadying assistance (Pt.75%)  Function - Air cabin crew transfer assistive device: Grab bar Assist level to toilet: Touching or steadying assistance (Pt > 75%) Assist level from toilet: Touching or steadying assistance (Pt > 75%)  Function - Chair/bed transfer Chair/bed transfer method: Squat pivot, Stand pivot Chair/bed transfer assist level: Touching or steadying assistance (Pt > 75%) Chair/bed transfer assistive device: Armrests, Walker  Function - Locomotion: Wheelchair Will patient use wheelchair at discharge?: Yes Type: Manual Max wheelchair distance: 150 ft Assist Level: Supervision or verbal cues Assist Level: Supervision or verbal cues Assist  Level: Supervision or verbal cues Turns around,maneuvers to table,bed, and toilet,negotiates 3% grade,maneuvers on rugs and over doorsills: No Function - Locomotion: Ambulation Assistive device: Walker-rolling Max distance: 7 ft Assist level: Touching or steadying assistance (Pt > 75%) Walk 10 feet activity did not occur: Safety/medical concerns Walk 50 feet with 2 turns activity did not occur: Safety/medical concerns Walk 150 feet activity did not occur: Safety/medical concerns Walk 10 feet on uneven surfaces activity did not occur: Safety/medical concerns  Function - Comprehension Comprehension: Auditory Comprehension assist level: Understands complex 90% of the time/cues 10% of the time  Function - Expression Expression: Verbal Expression assist level: Expresses complex 90% of the time/cues < 10% of the time  Function - Social Interaction Social Interaction assist level:  Interacts appropriately 75 - 89% of the time - Needs redirection for appropriate language or to initiate interaction.  Function - Problem Solving Problem solving assist level: Solves basic 75 - 89% of the time/requires cueing 10 - 24% of the time  Function - Memory Memory assist level: Recognizes or recalls 75 - 89% of the time/requires cueing 10 - 24% of the time Patient normally able to recall (first 3 days only): Location of own room, Staff names and faces, That he or she is in a hospital  Medical Problem List and Plan: 1.  Gait abnormality, limitations with transfers secondary to right AKA. cont PT, OT 2.  DVT Prophylaxis/Anticoagulation: Pharmaceutical: Lovenox 3. Pain Management: hydrocodone effective. Add 130m gabapentin for phantom pain 4. Mood: LCSW to follow for evaluation and support.ask  neuropsych to eval  5. Neuropsych: This patient is  capable of making decisions on his own behalf. 6. Skin/Wound Care: monitor wound daily for healing.surgical wound healing well sm anmt drainage  7.  Fluids/Electrolytes/Nutrition: Monitor I/O. Check lytes in am.  8. New diagnosis DM:  Hgb A1c- 6.6. Educate patient on CM diet. Monitor BS ac/hs and use SSI for elevated BS and to promote wound healing.  9. ABLA: add iron supplement.  10.  Hx CVA, Left PCA infarct  June 2017 with chronic R HP, aphasia LOS (Days) 2 A FACE TO FACE EVALUATION WAS PERFORMED  Ellarose Brandi E 11/19/2016, 8:01 AM

## 2016-11-19 NOTE — Progress Notes (Signed)
Occupational Therapy Session Note  Patient Details  Name: Ronald Forbes MRN: [id number] Date of Birth: 19-Nov-1960  Today's Date: 11/19/2016 OT Individual Time: 1400-1440 OT Individual Time Calculation (min): 40 min    Short Term Goals: Week 1:  OT Short Term Goal 1 (Week 1): LTG=STG 2/2 estimated short LOS  Skilled Therapeutic Interventions/Progress Updates:    1:1. No c/o pain. Pt refuses practicing LB dressing/toileting. Pt says, " I dont know why you keep bothering me. You are just trying to prove you can do everything and I can't." Educated pt on CIR process and importance of participating in therapy. Focus of session on standing balance and endurance in prep for ADLs and IADLs. Pt attempts to transfer to w/c with w/c in front of pt. OT intervenes, and educates on safety awareness and w/c positioning for squat pivot transfer with CGA EOB>w/c>EOM>w/c. Pt completes horse shoe toss, stacking cups on high low table, and pipe tree activity all in standing with CGA and Vc for posture/weight bearing through RUE. Pt requires MIN VC for problem solving appropriate pipe tree pieces. Pt propels w/c back to room using L UE/LE with increased time. Exited session with pt seated in w/c. No chair alarm present, RN aware no chair alarm available and pt up in w/c.  Therapy Documentation Precautions:  Precautions Precautions: Fall Precaution Comments: h/o R hemi due to CVA in June 2017 Restrictions Weight Bearing Restrictions: Yes RLE Weight Bearing: Non weight bearing General:   Vital Signs:  Pain:   ADL: ADL ADL Comments: Please see functional navigator Exercises:   Other Treatments:    See Function Navigator for Current Functional Status.   Therapy/Group: Individual Therapy  Shon Hale 11/19/2016, 2:42 PM

## 2016-11-19 NOTE — Progress Notes (Signed)
Physical Therapy Session Note  Patient Details  Name: Ronald Forbes MRN: [id number] Date of Birth: 09/16/1960  Today's Date: 11/19/2016 PT Individual Time: 1530-1615 PT Individual Time Calculation (min): 45 min   Short Term Goals: Week 1:  PT Short Term Goal 1 (Week 1): = LTGs due to anticipated LOS  Skilled Therapeutic Interventions/Progress Updates:    pt noting "all over" pain in RLE throughout session, RN aware and in to provide medication at end of session.  Session focus on functional mobility and RLE therex.  Pt transfers throughout session sit<>stand and stand pivot with close supervision>steady assist.  Pt ambulates 8' with RW, unsteady gait but no LOB, min assist for safety.  PT instructed pt in RLE therex including 10 reps glute sets, isometric hip extension, hip flexion in supine, and standing hip extension.  Pt demos decreased R hip extensor strength in all exercises 2/2 chronic CVA.  PT provided education regarding pain management for phantom pain as well as use of ice pack to RLE to reduce inflammation and post surgical pain.      Pt perseverative throughout session on not wanting to participate in therapy, stating "it should be my choice if I do this."  PT reoriented to rehab unit and PT goals.  Pt missed remaining 15 minutes of therapy 2/2 pain and requesting to terminate session early.  Agreeable to sit up in w/c with ice pack to RLE.  Therapy Documentation Precautions:  Precautions Precautions: Fall Precaution Comments: h/o R hemi due to CVA in June 2017 Restrictions Weight Bearing Restrictions: Yes RLE Weight Bearing: Non weight bearing General: PT Amount of Missed Time (min): 15 Minutes PT Missed Treatment Reason: Pain   See Function Navigator for Current Functional Status.   Therapy/Group: Individual Therapy  Ladora Daniel Penven-Crew 11/19/2016, 4:41 PM

## 2016-11-19 NOTE — Progress Notes (Signed)
Occupational Therapy Session Note  Patient Details  Name: Ronald Forbes MRN: [id number] Date of Birth: Feb 16, 1961  Today's Date: 11/19/2016 OT Individual Time: 9373-7496 OT Individual Time Calculation (min): 47 min    Short Term Goals: Week 1:  OT Short Term Goal 1 (Week 1): LTG=STG 2/2 estimated short LOS  Skilled Therapeutic Interventions/Progress Updates:    OT treatment session focused on modified bathing/dressing, standing balance, and transfer training. Pt reluctant to participate in OT this morning 2/2 8/10 pain in residual limb. Pt came to sitting EOB with increased time and supervision. Set-up for bathing/dressing tasks at EOB with VC for use of RW w/ sit<>stand to remove underwear and wash bottom. Close supervision + intermittent min guard A for standing balcne when reaching behind. Pt not receptive to modified LB dressing strategies and required VC and min A to thread L residual limb into pant leg. Pt requested need to urinate and brought pt into bathroom via w/c. Pt impulsive to stand w/o locking w/c and prior to OT instruction for safety requiring. Educated pt on use of RW over toilet for balance when standing to urinate and pt seemed receptive to suggestion. Pt left seated in wc with needs met and RN notified of pt status.   Therapy Documentation Precautions:  Precautions Precautions: Fall Precaution Comments: h/o R hemi due to CVA in June 2017 Restrictions Weight Bearing Restrictions: Yes RLE Weight Bearing: Non weight bearing Pain: Pain Assessment Pain Assessment: 0-10 Pain Score: 8  Pain Type: Surgical pain Pain Location: Leg Pain Orientation: Right Pain Descriptors / Indicators: Burning Pain Onset: On-going Pain Intervention(s): Repositioned;RN made aware ADL: ADL ADL Comments: Please see functional navigator  See Function Navigator for Current Functional Status.   Therapy/Group: Individual Therapy  Valma Cava 11/19/2016, 12:50 PM

## 2016-11-19 NOTE — IPOC Note (Signed)
Overall Plan of Care [facility]) Patient Details Name: Ronald Forbes MRN: [id number] DOB: 01-16-1961  Admitting Diagnosis: R AKA  Hospital Problems: Active Problems:   Unilateral AKA, right (HCC)     Functional Problem List: Nursing Endurance, Pain, Safety, Skin Integrity  PT Balance, Behavior, Edema, Endurance, Motor, Nutrition, Pain, Safety, Sensory, Skin Integrity  OT Balance, Behavior, Motor, Pain, Safety  SLP    TR         Basic ADL's: OT Grooming, Bathing, Toileting, Dressing     Advanced  ADL's: OT       Transfers: PT Bed Mobility, Bed to Chair, Car, Occupational psychologist, Research scientist (life sciences): PT Ambulation, Psychologist, prison and probation services, Stairs     Additional Impairments: OT None (Pt does not want to work on functional use of UE)  SLP        TR      Anticipated Outcomes Item Anticipated Outcome  Self Feeding    Swallowing      Basic self-care  Supervision  Toileting  Supervision   Bathroom Transfers Supervision   Bowel/Bladder  Continent to bowel and bladder with min. assist.  Transfers  mod I  Locomotion  supervision household ambulator, mod I w/c level  Communication     Cognition     Pain  Less than 3 on 1 to 10 scale.  Safety/Judgment  Free from falls during his stay in rehab.   Therapy Plan: PT Intensity: Minimum of 1-2 x/day ,45 to 90 minutes PT Frequency: Total of 15 hours over 7 days of combined therapies PT Duration Estimated Length of Stay: 5-7 days OT Intensity: Minimum of 1-2 x/day, 45 to 90 minutes OT Frequency: 5 out of 7 days OT Duration/Estimated Length of Stay: 5-7 days         Team Interventions: Nursing Interventions Patient/Family Education, Pain Management, Skin Care/Wound Management  PT interventions Ambulation/gait training, Warden/ranger, Cognitive remediation/compensation, Community reintegration, Discharge planning, Disease management/prevention, DME/adaptive equipment instruction, Functional mobility  training, Neuromuscular re-education, Pain management, Patient/family education, Psychosocial support, Skin care/wound management, Stair training, Therapeutic Activities, Therapeutic Exercise, UE/LE Strength taining/ROM, UE/LE Coordination activities, Wheelchair propulsion/positioning  OT Interventions Warden/ranger, Firefighter, Discharge planning, DME/adaptive equipment instruction, Functional mobility training, Pain management, Patient/family education, Self Care/advanced ADL retraining, Psychosocial support, Therapeutic Activities, Therapeutic Exercise, UE/LE Strength taining/ROM, UE/LE Coordination activities  SLP Interventions    TR Interventions    SW/CM Interventions Discharge Planning, Psychosocial Support, Patient/Family Education    Team Discharge Planning: Destination: PT-Home ,OT- Home , SLP-  Projected Follow-up: PT-Home health PT, OT-  Home health OT, 24 hour supervision/assistance, SLP-  Projected Equipment Needs: PT-Wheelchair cushion (measurements), Wheelchair (measurements), Rolling walker with 5" wheels, OT- 3 in 1 bedside comode, Wheelchair cushion (measurements), Wheelchair (measurements) (likely will need wide drop arm BSC), SLP-  Equipment Details: PT-pt has SPC, OT-Pt has tub bench and RW from previous admission Patient/family involved in discharge planning: PT- Patient,  OT-Patient, SLP-   MD ELOS: 5-7 days. Medical Rehab Prognosis:  Good Assessment: 56 y.o. malewith history of CVA with residual right sided weakness, morbid obesity, ongoing tobacco and alcohol abuse, PVD s/p femoral to anterior tibial bypass graft 10/25/16 who was readmitted on 11/10/16 with right foot pain and swelling. He was found to have occluded right leg bypass and was admitted for pain control and amputation recommended. He underwent R-AKA on 4/2 by Dr. Darrick Penna and therapy initiated. Patient with deficits in mobility as well as decrease in ability to carry out  ADL tasks. Will  set goals for supervision with PT/OT.  See Team Conference Notes for weekly updates to the plan of care

## 2016-11-20 ENCOUNTER — Inpatient Hospital Stay (HOSPITAL_COMMUNITY): Payer: Medicaid Other

## 2016-11-20 DIAGNOSIS — I6930 Unspecified sequelae of cerebral infarction: Secondary | ICD-10-CM

## 2016-11-20 DIAGNOSIS — E119 Type 2 diabetes mellitus without complications: Secondary | ICD-10-CM

## 2016-11-20 DIAGNOSIS — G546 Phantom limb syndrome with pain: Secondary | ICD-10-CM

## 2016-11-20 LAB — GLUCOSE, CAPILLARY
GLUCOSE-CAPILLARY: 121 mg/dL — AB (ref 65–99)
GLUCOSE-CAPILLARY: 158 mg/dL — AB (ref 65–99)
GLUCOSE-CAPILLARY: 123 mg/dL — AB (ref 65–99)
GLUCOSE-CAPILLARY: 115 mg/dL — AB (ref 65–99)

## 2016-11-20 NOTE — Progress Notes (Signed)
Subjective/Complaints: Pt seen sitting up in bed.  He did not sleep well overnight. He is agitated because he states there is no phone.   ROS- Denies CP, SOB, N/V/D   Objective: Vital Signs: Blood pressure 129/89, pulse 66, temperature 98.9 F (37.2 C), temperature source Oral, resp. rate 16, height 6' 3"  (1.905 m), weight 99.2 kg (218 lb 11.1 oz), SpO2 99 %. No results found. Results for orders placed or performed during the hospital encounter of 11/17/16 (from the past 72 hour(s))  Glucose, capillary     Status: Abnormal   Collection Time: 11/17/16  9:10 PM  Result Value Ref Range   Glucose-Capillary 144 (H) 65 - 99 mg/dL  CBC WITH DIFFERENTIAL     Status: Abnormal   Collection Time: 11/18/16  5:57 AM  Result Value Ref Range   WBC 7.3 4.0 - 10.5 K/uL   RBC 3.79 (L) 4.22 - 5.81 MIL/uL   Hemoglobin 11.5 (L) 13.0 - 17.0 g/dL   HCT 34.3 (L) 39.0 - 52.0 %   MCV 90.5 78.0 - 100.0 fL   MCH 30.3 26.0 - 34.0 pg   MCHC 33.5 30.0 - 36.0 g/dL   RDW 13.2 11.5 - 15.5 %   Platelets 169 150 - 400 K/uL   Neutrophils Relative % 65 %   Neutro Abs 4.8 1.7 - 7.7 K/uL   Lymphocytes Relative 25 %   Lymphs Abs 1.8 0.7 - 4.0 K/uL   Monocytes Relative 8 %   Monocytes Absolute 0.6 0.1 - 1.0 K/uL   Eosinophils Relative 2 %   Eosinophils Absolute 0.2 0.0 - 0.7 K/uL   Basophils Relative 0 %   Basophils Absolute 0.0 0.0 - 0.1 K/uL  Comprehensive metabolic panel     Status: Abnormal   Collection Time: 11/18/16  5:57 AM  Result Value Ref Range   Sodium 137 135 - 145 mmol/L   Potassium 3.7 3.5 - 5.1 mmol/L   Chloride 104 101 - 111 mmol/L   CO2 24 22 - 32 mmol/L   Glucose, Bld 128 (H) 65 - 99 mg/dL   BUN 6 6 - 20 mg/dL   Creatinine, Ser 0.56 (L) 0.61 - 1.24 mg/dL   Calcium 8.7 (L) 8.9 - 10.3 mg/dL   Total Protein 6.2 (L) 6.5 - 8.1 g/dL   Albumin 2.8 (L) 3.5 - 5.0 g/dL   AST 40 15 - 41 U/L   ALT 54 17 - 63 U/L   Alkaline Phosphatase 80 38 - 126 U/L   Total Bilirubin 0.7 0.3 - 1.2 mg/dL   GFR calc  non Af Amer >60 >60 mL/min   GFR calc Af Amer >60 >60 mL/min    Comment: (NOTE) The eGFR has been calculated using the CKD EPI equation. This calculation has not been validated in all clinical situations. eGFR's persistently <60 mL/min signify possible Chronic Kidney Disease.    Anion gap 9 5 - 15  Glucose, capillary     Status: Abnormal   Collection Time: 11/18/16  6:31 AM  Result Value Ref Range   Glucose-Capillary 114 (H) 65 - 99 mg/dL  Glucose, capillary     Status: Abnormal   Collection Time: 11/18/16 11:28 AM  Result Value Ref Range   Glucose-Capillary 125 (H) 65 - 99 mg/dL   Comment 1 Notify RN   Glucose, capillary     Status: Abnormal   Collection Time: 11/18/16  4:23 PM  Result Value Ref Range   Glucose-Capillary 137 (H) 65 - 99 mg/dL  Comment 1 Notify RN   Glucose, capillary     Status: Abnormal   Collection Time: 11/18/16  9:18 PM  Result Value Ref Range   Glucose-Capillary 147 (H) 65 - 99 mg/dL  Glucose, capillary     Status: Abnormal   Collection Time: 11/19/16  6:46 AM  Result Value Ref Range   Glucose-Capillary 123 (H) 65 - 99 mg/dL  Glucose, capillary     Status: Abnormal   Collection Time: 11/19/16 11:52 AM  Result Value Ref Range   Glucose-Capillary 103 (H) 65 - 99 mg/dL  Glucose, capillary     Status: Abnormal   Collection Time: 11/19/16  4:40 PM  Result Value Ref Range   Glucose-Capillary 132 (H) 65 - 99 mg/dL  Glucose, capillary     Status: Abnormal   Collection Time: 11/19/16  8:00 PM  Result Value Ref Range   Glucose-Capillary 121 (H) 65 - 99 mg/dL  Glucose, capillary     Status: Abnormal   Collection Time: 11/20/16  6:34 AM  Result Value Ref Range   Glucose-Capillary 121 (H) 65 - 99 mg/dL     HEENT: normocephalic, atraumatic Cardio: RRR and no JVD Resp: CTA B/L and Unlabored GI: BS positive and ND Skin:   Wound C/D/I and Right AKA staples intact  Neuro:  Motor 3/5 delt, 4/5 Right bi, tri grip 5/5 LUE and LLE Dysarthric  Musc/Skel:   Left AKA Gen NAD,  Well-developed.   Assessment/Plan: 1. 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 Physiatrist is providing close team supervision and 24 hour management of active medical problems listed below. Physiatrist and rehab team continue to assess barriers to discharge/monitor patient progress toward functional and medical goals. FIM: Function - Bathing Bathing activity did not occur: Refused Position: Sitting EOB Body parts bathed by patient: Right arm, Left arm, Chest, Abdomen, Front perineal area, Buttocks, Left upper leg Body parts bathed by helper: Left lower leg Bathing not applicable: Right lower leg, Right upper leg Assist Level: Touching or steadying assistance(Pt > 75%), Set up Set up : To obtain items  Function- Upper Body Dressing/Undressing What is the patient wearing?: Pull over shirt/dress Pull over shirt/dress - Perfomed by patient: Thread/unthread right sleeve, Thread/unthread left sleeve, Put head through opening, Pull shirt over trunk Assist Level: Set up Set up : To obtain clothing/put away Function - Lower Body Dressing/Undressing What is the patient wearing?: Pants, Non-skid slipper socks Position: Sitting EOB Underwear - Performed by helper: Thread/unthread right underwear leg, Thread/unthread left underwear leg, Pull underwear up/down Pants- Performed by patient: Thread/unthread right pants leg, Pull pants up/down Pants- Performed by helper: Thread/unthread left pants leg Non-skid slipper socks- Performed by patient: Don/doff left sock Assist for footwear: Setup Assist for lower body dressing: Touching or steadying assistance (Pt > 75%)  Function - Toileting Toileting steps completed by patient: Adjust clothing prior to toileting, Performs perineal hygiene, Adjust clothing after  toileting Toileting Assistive Devices: Grab bar or rail Assist level: Touching or steadying assistance (Pt.75%)  Function - Air cabin crew transfer assistive device: Grab bar Assist level to toilet: Touching or steadying assistance (Pt > 75%) Assist level from toilet: Touching or steadying assistance (Pt > 75%)  Function - Chair/bed transfer Chair/bed transfer method: Stand pivot Chair/bed transfer assist level: Touching or steadying assistance (Pt > 75%) Chair/bed transfer assistive device: Armrests, Walker  Function - Locomotion: Wheelchair  Will patient use wheelchair at discharge?: Yes Type: Manual Max wheelchair distance: 150 ft Assist Level: No help, No cues, assistive device, takes more than reasonable amount of time Assist Level: No help, No cues, assistive device, takes more than reasonable amount of time Assist Level: No help, No cues, assistive device, takes more than reasonable amount of time Turns around,maneuvers to table,bed, and toilet,negotiates 3% grade,maneuvers on rugs and over doorsills: No Function - Locomotion: Ambulation Assistive device: Walker-rolling Max distance: 8 Assist level: Touching or steadying assistance (Pt > 75%) Walk 10 feet activity did not occur: Safety/medical concerns Walk 50 feet with 2 turns activity did not occur: Safety/medical concerns Walk 150 feet activity did not occur: Safety/medical concerns Walk 10 feet on uneven surfaces activity did not occur: Safety/medical concerns  Function - Comprehension Comprehension: Auditory Comprehension assist level: Follows basic conversation/direction with no assist  Function - Expression Expression: Verbal Expression assist level: Expresses basic needs/ideas: With extra time/assistive device  Function - Social Interaction Social Interaction assist level: Interacts appropriately 50 - 74% of the time - May be physically or verbally inappropriate.  Function - Problem Solving Problem  solving assist level: Solves basic 75 - 89% of the time/requires cueing 10 - 24% of the time  Function - Memory Memory assist level: Recognizes or recalls 75 - 89% of the time/requires cueing 10 - 24% of the time Patient normally able to recall (first 3 days only): Location of own room, Staff names and faces, That he or she is in a hospital  Medical Problem List and Plan: 1.  Gait abnormality, limitations with transfers secondary to right AKA.  Cont CIR 2.  DVT Prophylaxis/Anticoagulation: Pharmaceutical: Lovenox 3. Pain Management: hydrocodone effective. 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 Mood: LCSW to follow for evaluation and support.ask  neuropsych to eval  5. Neuropsych: This patient is  capable of making decisions on his own behalf. 6. Skin/Wound Care: monitor wound daily for healing.surgical wound healing well sm anmt drainage  7. Fluids/Electrolytes/Nutrition: Monitor I/Os 8. New diagnosis DM:  Hgb A1c- 6.6. Educate patient on CM diet. Monitor BS ac/hs and use SSI for elevated BS and to promote wound healing.   CBGs relatively controlled 4/7 9. ABLA: added iron supplement.  10.  Hx CVA, Left PCA infarct  June 2017 with chronic R HP  LOS (Days) 3 A FACE TO FACE EVALUATION WAS PERFORMED  Ankit ALorie Phenix4/02/2017, 7:14 AM

## 2016-11-20 NOTE — Progress Notes (Signed)
Occupational Therapy Session Note  Patient Details  Name: Ronald Forbes MRN: [id number] Date of Birth: Aug 14, 1961  Today's Date: 11/20/2016 OT Individual Time: 7829-5621 OT Individual Time Calculation (min): 53 min   Missed time: 15 min  Reason: Pt refusal to participate   Short Term Goals: Week 1:  OT Short Term Goal 1 (Week 1): LTG=STG 2/2 estimated short LOS  Skilled Therapeutic Interventions/Progress Updates:    Session 1: 1:1. Pt supine in bed and with encouragement agreeable to participate in tx. Pt decline opportunity to bathe/change shirt. Pt don pull up pants with CGA for standing balance to advance pants over hips and VC for safety awareness when standing. Pt stands at sink to brush teeth weight bearing into RUE with CGA. Pt requires VC throughout session to lock w/c breaks prior to adjusting hips/standing in w/c. Pt propels w/c to/from kitchen with L UE/LE with supervision. In kitchen OT educates on proper w/c positioning to reach items, cooking strategies from w/c level and energy conservation techniques. Pt requires question cueing to plan ahead for all needed items for making pancakes. Using strategies, pt gathers all needed item and cooks 3 pancakes from seated in w/c with supervision and HOH A of RUE to use spatula to scrape batter out of bowl. Exited session with pt seated in w/c with call light in reach and pt declines QRB/chair alarm. RN aware.  Session 2: 1:1 Pt agreeable to practice bed mobility on standard bed. Pt reporting pain but does not want to alert nurse since just administered pain medication. Pt supine>EOB>w/c using hand rails. Pt propels w/c to/from therapeutic destinations with increased time. Pt stand pivot transfer with steadying assist from OT and VC for safety awareness. Pt EOB<>supine with supervision and VC for lateral leaning to move residual limb. Pt refuse use of walker despite recommendation from OT. Pt return to w/c as stated above. Pt refuse to  participate further in tx. Exited session with pt seated in w/c in room with call light in reach and pt declines QRB/chair alarm. Nurse tech aware.   Therapy Documentation Precautions:  Precautions Precautions: Fall Precaution Comments: h/o R hemi due to CVA in June 2017 Restrictions Weight Bearing Restrictions: Yes RLE Weight Bearing: Non weight bearing General:   Vital Signs:   Pain:   ADL: ADL ADL Comments: Please see functional navigator Exercises:   Other Treatments:    See Function Navigator for Current Functional Status.   Therapy/Group: Individual Therapy  Shon Hale 11/20/2016, 10:13 AM

## 2016-11-20 NOTE — Progress Notes (Signed)
Physical Therapy Note  Patient Details  Name: Ronald Forbes MRN: [id number] Date of Birth: 1961-05-30 Today's Date: 11/20/2016    Pt missed 60 min of skilled PT due to refusal. Despite several educational efforts and suggestions for therapeutic intervention, pt continues to decline stating he would not be doing anything else today. Continue PT POC.   [name], PT, DPT  11/20/2016, 3:03 PM

## 2016-11-21 ENCOUNTER — Inpatient Hospital Stay (HOSPITAL_COMMUNITY): Payer: Medicaid Other

## 2016-11-21 ENCOUNTER — Inpatient Hospital Stay (HOSPITAL_COMMUNITY): Payer: Medicaid Other | Admitting: Physical Therapy

## 2016-11-21 DIAGNOSIS — G8111 Spastic hemiplegia affecting right dominant side: Secondary | ICD-10-CM

## 2016-11-21 DIAGNOSIS — I639 Cerebral infarction, unspecified: Secondary | ICD-10-CM

## 2016-11-21 DIAGNOSIS — IMO0002 Reserved for concepts with insufficient information to code with codable children: Secondary | ICD-10-CM

## 2016-11-21 DIAGNOSIS — E1151 Type 2 diabetes mellitus with diabetic peripheral angiopathy without gangrene: Secondary | ICD-10-CM

## 2016-11-21 LAB — GLUCOSE, CAPILLARY
GLUCOSE-CAPILLARY: 95 mg/dL (ref 65–99)
GLUCOSE-CAPILLARY: 117 mg/dL — AB (ref 65–99)
GLUCOSE-CAPILLARY: 160 mg/dL — AB (ref 65–99)
Glucose-Capillary: 105 mg/dL — ABNORMAL HIGH (ref 65–99)

## 2016-11-21 NOTE — Progress Notes (Signed)
Physical Therapy Session Note  Patient Details  Name: Ronald Forbes MRN: [id number] Date of Birth: 1961/02/07  Today's Date: 11/21/2016 PT Individual Time: 7829-5621 PT Individual Time Calculation (min): 9 min   Short Term Goals: Week 1:  PT Short Term Goal 1 (Week 1): = LTGs due to anticipated LOS  Skilled Therapeutic Interventions/Progress Updates:  Pt received in bed, initially not verbally communicating with therapist but eventually willing. Pt refusing to participate in therapy and when therapist attempted to educate pt on reason for being in CIR pt reported "I'm in jail". Educated pt on purpose of therapy and importance of participation to ensure safe d/c home. Pt reports he feels he currently could go home and transfer bed<>w/c without help. Pt demonstrated transferring bed<>w/c with steady assist fade to close supervision via stand pivot. While pt was sitting in w/c attempted to encourage pt to propel out of room. Pt not agreeable and cursing at therapist. Pt returned to bed & left sitting on EOB with all needs within reach & bed alarm set; RN made aware of pt's position.  Pt missed 66 minutes of skilled PT treatment 2/2 refusing to participate.   Therapy Documentation Precautions:  Precautions Precautions: Fall Precaution Comments: h/o R hemi due to CVA in June 2017 Restrictions Weight Bearing Restrictions: Yes RLE Weight Bearing: Non weight bearing  General: PT Amount of Missed Time (min): 66 Minutes PT Missed Treatment Reason: Patient unwilling to participate  Pain: Pt reported pain in RLE but did not rate.   See Function Navigator for Current Functional Status.   Therapy/Group: Individual Therapy  Sandi Mariscal 11/21/2016, 2:45 PM

## 2016-11-21 NOTE — Progress Notes (Signed)
Subjective/Complaints: Patient seen sitting up in bed this morning. He states he slept fairly. He denies complaints.  ROS- denies CP, SOB, N/V/D   Objective: Vital Signs: Blood pressure 127/84, pulse 64, temperature 98.4 F (36.9 C), temperature source Oral, resp. rate 17, height  (1.905 m), weight 99.2 kg (218 lb 11.1 oz), SpO2 96 %. No results found. Results for orders placed or performed during the hospital encounter of 11/17/16 (from the past 72 hour(s))  Glucose, capillary     Status: Abnormal   Collection Time: 11/18/16 11:28 AM  Result Value Ref Range   Glucose-Capillary 125 (H) 65 - 99 mg/dL   Comment 1 Notify RN   Glucose, capillary     Status: Abnormal   Collection Time: 11/18/16  4:23 PM  Result Value Ref Range   Glucose-Capillary 137 (H) 65 - 99 mg/dL   Comment 1 Notify RN   Glucose, capillary     Status: Abnormal   Collection Time: 11/18/16  9:18 PM  Result Value Ref Range   Glucose-Capillary 147 (H) 65 - 99 mg/dL  Glucose, capillary     Status: Abnormal   Collection Time: 11/19/16  6:46 AM  Result Value Ref Range   Glucose-Capillary 123 (H) 65 - 99 mg/dL  Glucose, capillary     Status: Abnormal   Collection Time: 11/19/16 11:52 AM  Result Value Ref Range   Glucose-Capillary 103 (H) 65 - 99 mg/dL  Glucose, capillary     Status: Abnormal   Collection Time: 11/19/16  4:40 PM  Result Value Ref Range   Glucose-Capillary 132 (H) 65 - 99 mg/dL  Glucose, capillary     Status: Abnormal   Collection Time: 11/19/16  8:00 PM  Result Value Ref Range   Glucose-Capillary 121 (H) 65 - 99 mg/dL  Glucose, capillary     Status: Abnormal   Collection Time: 11/20/16  6:34 AM  Result Value Ref Range   Glucose-Capillary 121 (H) 65 - 99 mg/dL  Glucose, capillary     Status: Abnormal   Collection Time: 11/20/16 11:55 AM  Result Value Ref Range   Glucose-Capillary 158 (H) 65 - 99 mg/dL  Glucose, capillary     Status: Abnormal   Collection Time: 11/20/16  4:23 PM  Result  Value Ref Range   Glucose-Capillary 123 (H) 65 - 99 mg/dL  Glucose, capillary     Status: Abnormal   Collection Time: 11/20/16  8:29 PM  Result Value Ref Range   Glucose-Capillary 115 (H) 65 - 99 mg/dL  Glucose, capillary     Status: None   Collection Time: 11/21/16  6:44 AM  Result Value Ref Range   Glucose-Capillary 95 65 - 99 mg/dL     HEENT: normocephalic, atraumatic Cardio: RRR and No JVD Resp: CTA B/L and unlabored GI: BS positive and ND Skin:   Wound C/D/I and Right AKA staples intact  Neuro:  Motor 3/5 delt, 4/5 Right bi, tri grip 5/5 LUE and LLE Dysarthric  Musc/Skel:  Left AKA Gen NAD,  Well-developed.   Assessment/Plan: 1. 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 Physiatrist is providing close team supervision and 24 hour management of active medical problems listed below. Physiatrist and rehab team continue to assess barriers to discharge/monitor patient progress toward functional and medical goals. FIM: Function - Bathing Bathing activity did not occur: Refused Position: Sitting EOB Body parts bathed  by patient: Right arm, Left arm, Chest, Abdomen, Front perineal area, Buttocks, Left upper leg Body parts bathed by helper: Left lower leg Bathing not applicable: Right lower leg, Right upper leg Assist Level: Touching or steadying assistance(Pt > 75%), Set up Set up : To obtain items  Function- Upper Body Dressing/Undressing What is the patient wearing?: Pull over shirt/dress Pull over shirt/dress - Perfomed by patient: Thread/unthread right sleeve, Thread/unthread left sleeve, Put head through opening, Pull shirt over trunk Assist Level: Set up Set up : To obtain clothing/put away Function - Lower Body Dressing/Undressing What is the patient wearing?: Pants Position: Sitting EOB Underwear - Performed  by helper: Thread/unthread right underwear leg, Thread/unthread left underwear leg, Pull underwear up/down Pants- Performed by patient: Thread/unthread right pants leg, Pull pants up/down, Thread/unthread left pants leg Pants- Performed by helper: Thread/unthread left pants leg Non-skid slipper socks- Performed by patient: Don/doff left sock Assist for footwear: Setup Assist for lower body dressing: Supervision or verbal cues  Function - Toileting Toileting steps completed by patient: Adjust clothing prior to toileting, Performs perineal hygiene, Adjust clothing after toileting Toileting Assistive Devices: Grab bar or rail Assist level: Touching or steadying assistance (Pt.75%)  Function - Archivist transfer assistive device: Grab bar Assist level to toilet: Touching or steadying assistance (Pt > 75%) Assist level from toilet: Touching or steadying assistance (Pt > 75%)  Function - Chair/bed transfer Chair/bed transfer method: Stand pivot Chair/bed transfer assist level: Touching or steadying assistance (Pt > 75%) Chair/bed transfer assistive device: Armrests  Function - Locomotion: Wheelchair Will patient use wheelchair at discharge?: Yes Type: Manual Max wheelchair distance: 150 ft Assist Level: No help, No cues, assistive device, takes more than reasonable amount of time Assist Level: No help, No cues, assistive device, takes more than reasonable amount of time Assist Level: No help, No cues, assistive device, takes more than reasonable amount of time Turns around,maneuvers to table,bed, and toilet,negotiates 3% grade,maneuvers on rugs and over doorsills: No Function - Locomotion: Ambulation Assistive device: Walker-rolling Max distance: 8 Assist level: Touching or steadying assistance (Pt > 75%) Walk 10 feet activity did not occur: Safety/medical concerns Walk 50 feet with 2 turns activity did not occur: Safety/medical concerns Walk 150 feet activity did not  occur: Safety/medical concerns Walk 10 feet on uneven surfaces activity did not occur: Safety/medical concerns  Function - Comprehension Comprehension: Auditory Comprehension assist level: Follows basic conversation/direction with no assist  Function - Expression Expression: Verbal Expression assist level: Expresses basic needs/ideas: With no assist  Function - Social Interaction Social Interaction assist level: Interacts appropriately 90% of the time - Needs monitoring or encouragement for participation or interaction.  Function - Problem Solving Problem solving assist level: Solves basic problems with no assist  Function - Memory Memory assist level: Recognizes or recalls 90% of the time/requires cueing < 10% of the time Patient normally able to recall (first 3 days only): Current season, Location of own room, Staff names and faces, That he or she is in a hospital  Medical Problem List and Plan: 1.  Gait abnormality, limitations with transfers secondary to right AKA.  Cont CIR 2.  DVT Prophylaxis/Anticoagulation: Pharmaceutical: Lovenox 3. Pain Management: hydrocodone effective. Added  gabapentin for phantom pain 4. Mood: LCSW to follow for evaluation and support.  Neuropsych to eval  5. Neuropsych: This patient is  capable of making decisions on his own behalf. 6. Skin/Wound Care: monitor wound daily for healing.surgical wound healing well sm anmt drainage  7.  Fluids/Electrolytes/Nutrition: Monitor I/Os 8. New diagnosis DM:  Hgb A1c- 6.6. Educate patient on CM diet. Monitor BS ac/hs and use SSI for elevated BS and to promote wound healing.   CBGs relatively controlled 4/8 9. ABLA: added iron supplement.  10.  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

## 2016-11-21 NOTE — Progress Notes (Signed)
Occupational Therapy Session Note  Patient Details  Name: Ronald Forbes MRN: [id number] Date of Birth: 1961-01-23  Today's Date: 11/21/2016 OT Individual Time: 4098-1191 OT Individual Time Calculation (min): 75 min    Short Term Goals: Week 1:  OT Short Term Goal 1 (Week 1): LTG=STG 2/2 estimated short LOS  Skilled Therapeutic Interventions/Progress Updates:    1;1 No complaint of pain. session focused on ADL retraining, education on subluxation, and general participation in therapy. Pt more agreeable to tx and respectful to therapist this date. Received pt on toilet voiding bowel and bladder. Pt squat pivot transfer with steadying A  toilet>w/c with VC for safety awareness. Pt sits in w/c at sink to bathe 9/9 body parts with touching A only to stand to wash buttocks at sink level. Pt dons non slip sock with VC for pt to prop foot onto trash can to raise foot up for better reach. Pt fastens snaps onto hospital gown and dons gown with A to fasten. Pt loads laundry bag and propels w/c to/ from all therapeutic destinations with increased time. Pt stands to load laundry into dryer and grab detergent pod from high shelf with Vc to lock w/c breaks prior to standing, locate start button and to use washing machine to balance at hips. Pt sits at table to write name and room number on bag. Pt unable to write letters without written example to copy. Pt educated on subluxation, supporting shoulder while walking and standing, and self ROM to maintain ROM/build strength in BUE d/t deficits from past stroke. Pt completes 1x10 reps of self ROM program with pictures and OT giving tactile cues for proper technique. Pt completes 1x15 towel glide exercises on table to weight bear through RUE and improve ROM/strength. Exited session with pt seated in w/c, call light in reach, and RN aware pt declined QRB/chair alarm.    Therapy Documentation Precautions:  Precautions Precautions: Fall Precaution Comments: h/o R hemi  due to CVA in June 2017 Restrictions Weight Bearing Restrictions: Yes RLE Weight Bearing: Non weight bearing General:   Vital Signs: ADL: ADL ADL Comments: Please see functional navigator  See Function Navigator for Current Functional Status.   Therapy/Group: Individual Therapy  Shon Hale 11/21/2016, 9:12 AM

## 2016-11-22 ENCOUNTER — Inpatient Hospital Stay (HOSPITAL_COMMUNITY): Payer: Medicaid Other | Admitting: Physical Therapy

## 2016-11-22 ENCOUNTER — Inpatient Hospital Stay (HOSPITAL_COMMUNITY): Payer: Medicaid Other | Admitting: Occupational Therapy

## 2016-11-22 ENCOUNTER — Encounter (HOSPITAL_COMMUNITY): Payer: Medicaid Other | Admitting: Psychology

## 2016-11-22 LAB — GLUCOSE, CAPILLARY
GLUCOSE-CAPILLARY: 108 mg/dL — AB (ref 65–99)
Glucose-Capillary: 116 mg/dL — ABNORMAL HIGH (ref 65–99)
Glucose-Capillary: 123 mg/dL — ABNORMAL HIGH (ref 65–99)
Glucose-Capillary: 135 mg/dL — ABNORMAL HIGH (ref 65–99)

## 2016-11-22 MED ORDER — HYDROCODONE-ACETAMINOPHEN 5-325 MG PO TABS
1.0000 | ORAL_TABLET | Freq: Four times a day (QID) | ORAL | Status: DC | PRN
  Administered 2016-11-22 – 2016-11-24 (×7): 2 via ORAL
  Filled 2016-11-22 (×7): qty 2

## 2016-11-22 NOTE — Progress Notes (Signed)
Physical Therapy Session Note  Patient Details  Name: Ronald Forbes MRN: [id number] Date of Birth: 05-Aug-1961  Today's Date: 11/22/2016 PT Individual Time: 1300-1346 PT Individual Time Calculation (min): 46 min   Missing 14 minutes of scheduled session time due to pt refusal.   Short Term Goals: Week 1:  PT Short Term Goal 1 (Week 1): = LTGs due to anticipated LOS  Skilled Therapeutic Interventions/Progress Updates:    Pt in bed upon arrival, reluctantly agreeing to PT session with encouragement. Exercises performed in bed for general LE strengthening; Lt- qs, hip abd/add, glute squeeze, SLR. Rt LE- hip abd/add, leg raise, hip extension into towel roll. Bed mobility:  Supervision supine>sitting. Transfers: sit<>stand with rw min guard, stand-pivot no device min guard, squat-pivot w/c<>bed min guard with cues for w/c placement and locking brakes. Gait: amb. 5 ft with rw using hop-to pattern. Pt resistant to cues for technique or any further distance. Pt able to propel w/c 200 ft with hemi technique and supervision - mod I. Cues and encouragement needed throughout session for participation. Pt repeatedly stating that he wants to go home. Pt refusing to complete available session time, stating that he was tired and wanted to eat. Pt in w/c with all needs within reach.   Therapy Documentation Precautions:  Precautions Precautions: Fall Precaution Comments: h/o R hemi due to CVA in June 2017 Restrictions Weight Bearing Restrictions: Yes RLE Weight Bearing: Non weight bearing Pain: Reports having pain but not placing a number on it. Describes phantom pain on Rt LE. Monitored during session.  See Function Navigator for Current Functional Status.   Therapy/Group: Individual Therapy  Delton See, PT 11/22/2016, 4:15 PM

## 2016-11-22 NOTE — Progress Notes (Signed)
Physical Therapy Session Note  Patient Details  Name: Ronald Forbes MRN: [id number] Date of Birth: 08/21/1960  Today's Date: 11/22/2016 PT Individual Time: 1300-1346 PT Individual Time Calculation (min): 46 min   Short Term Goals: Week 1:  PT Short Term Goal 1 (Week 1): = LTGs due to anticipated LOS  Skilled Therapeutic Interventions/Progress Updates: Pt received supine in bed, verbally agitated with therapist as soon as I entered the room. Pt reports he "doesn't know why I gotta do this" and "Why isn't everyone else having to do this?". Educated pt that goal of rehab is to improve strength, activity tolerance, mobility and ability to perform ADLs with reduced caregiver burden or risk of falls. Discussed that patients following amputation come to rehab often and we are well trained in our ability to teach individuals with amputation how to perform ADLs and get around the house, and that participation in activity/exercise is vital for preparing for prosthetic use when surgical site heals. Additionally discussed with pt that all patients on rehab received 3-4 hours of therapy per day, and that he agreed to come here for further assistance before going home. Pt reports he doesn't remember agreeing to that, states he can do everything on his own, and repeatedly refuses to get up with therapist to participate. As per patient rights, therapist allowed pt to remain in bed and not participate in therapy; missed 45 min PT.      Therapy Documentation General: PT Amount of Missed Time (min): 45 Minutes PT Missed Treatment Reason: Patient unwilling to participate  See Function Navigator for Current Functional Status.   Therapy/Group: Individual Therapy  Vista Lawman 11/22/2016, 2:57 PM

## 2016-11-22 NOTE — Progress Notes (Signed)
Occupational Therapy Session Note  Patient Details  Name: Ronald Forbes MRN: [id number] Date of Birth: Sep 21, 1960  Today's Date: 11/22/2016 OT Individual Time: 2440-1027 OT Individual Time Calculation (min): 45 min    Short Term Goals: Week 1:  OT Short Term Goal 1 (Week 1): LTG=STG 2/2 estimated short LOS  Skilled Therapeutic Interventions/Progress Updates:    Introduced self to patient, explained that he could work on shower transfers. Pt expressed that he was very frustrated about being here in rehab and wants to go home today.  He felt that this rehab was not helpful for him.  Explained to pt that if he demonstrates safe mobility skills then he will be ready to go home.  Pt stated he did not have a tub bench (SW reports he received a standard tub bench 2 mos ago), that he doesn't want his sister to help him at home.  Pt finally agreed to shower.  R limb covered.  Completed stand pivot bed to w/c to tub bench.  Once in shower, pt did not have the trunk mobility to laterally lean or the R hand coordination to reach to cleanse his back side. Recommended pt lean heavily to the R so he could use his L hand. Pt got frustrated and stated he would just stand.  Explained to pt that if he stands and holds bar with L hand, then he will not be able to let go to cleanse. He became frustrated and just stood up "I will not fall". Pt standing on one leg and leg go with both hands. I forcefully told pt to sit down as he was not listening to instructions. Pt sat and said that he wasn't going to fall. Strongly recommended to pt not to stand. He would most easily benefit from a cut out tub bench to reach to cleanse.  Will need full S from his sister at discharge.  Pt in w/c in room completing grooming routine.  Therapy Documentation Precautions:  Precautions Precautions: Fall Precaution Comments: h/o R hemi due to CVA in June 2017 Restrictions Weight Bearing Restrictions: Yes RLE Weight Bearing: Non weight  bearing       Pain: Pain Assessment Pain Assessment: No/denies pain ADL: ADL ADL Comments: Please see functional navigator  See Function Navigator for Current Functional Status.   Therapy/Group: Individual Therapy  Estalene Bergey 11/22/2016, 1:06 PM

## 2016-11-22 NOTE — Consult Note (Signed)
Neuropsychological Consultation   Patient:   Ronald Forbes   DOB:   02-Apr-1961  MR Number:  [id number]  Location:  [facility] [address] [facility] [address] [id number] [place] [address] Dept: [phone] Loc: [phone]           Date of Service:   11/22/2016  Start Time:   8:55 AM End Time:   9:25 AM  Provider/Observer:  Arley Phenix, Psy.D.       Clinical Neuropsychologist       Billing Code/Service: [phone] 4 Units  Chief Complaint:    Patient is not coping well with current rehabilitation efforts.  He is very angry and demanding that he go home this week, which the team is working on.  He continues to be very demanding of his wishing, many of which are not realistic at this time.  Reason for Service:  The patient was referred for a neuropsycholoigcal consultation due to poor coping with current rehabilitative efforts. There continues to be a lot of anger and attempts of controlling and manipulating staff. The patient is in need of a lot of rehabilitative work with his recent above-the-knee amputation and his prior stroke. However, the patient's anger that is being projected onto the treatment staff likely has its origins in the patient's inability to cope with increasing loss of control.  Current Status:  The patient continues with frustration and anger.  Reliability of Information: Information from Medical chart and review of case with treatment team.  Behavioral Observation: Dnaiel Voller  presents as a 56 y.o.-year-old Right African American Male who appeared his stated age. his dress was Appropriate and he was Casual and Fairly Groomed and his manners were inappropriate to the situation as he started the visit attempting to control the lighting, refusing to turn tv off and interacting in an angry manner even before I introduced myself.  his participation was indicative of Monopolizing and Resistant behaviors.   There were physical disabilities noted AKA.  he displayed an inappropriate level of cooperation and motivation.     Interactions:    Active Monopolizing and Resistant  Attention:   attention span appeared shorter than expected for age  Memory:   within normal limits; recent and remote memory intact  Visuo-spatial:  not examined  Speech (Volume):  normal  Speech:   rapid; rapid  Thought Process:  Circumstantial  Though Content:  Rumination; and highly focused on getting discharged  Orientation:   person, place and situation  Judgment:   Poor  Planning:   Poor  Affect:    Angry, Irritable, Labile and Resistant  Mood:    Angry and Irritable  Insight:   Lacking  Intelligence:   normal  Medical History:   Past Medical History:  Diagnosis Date  . Alcohol use disorder (HCC)   . Cerebral vascular accident (HCC)   . 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 Diabetes mellitus type 2 in obese (HCC)   . HLD (hyperlipidemia)   . Morbid obesity (HCC)   . Stroke (HCC)   . Tobacco abuse             Family Med/Psych History:  Family History  Problem Relation Age of Onset  . Hypertension Mother     Risk of Suicide/Violence:  The patient denies any SI or HI.  He is very angry and irritable  Impression/DX:  The patient was very resistant and irritable  during out meeting today.  He was focused on getting the staff to discharge him as soon as possible.  He reported feeling that there was not anything anyone could do as he has lost his leg and nothing they do will change that.  Clinical impression is of someone that has likely always used control as a way do dell with stress and when he was not able to control a situation would just aviod the issue altogether.  His level of anger and manipulation are likely ineffective attempts deal with his increasingly loss of control and functional abilities.    Diagnosis:    History of  CVA     AKA        Electronically Signed   _______________________ Arley Phenix, Psy.D.

## 2016-11-22 NOTE — Progress Notes (Signed)
Subjective/Complaints: "I'm sick of this place" Pt wants to go home but doesn't have specific reason, states his sister is at home with him  ROS- denies CP, SOB, N/V/D   Objective: Vital Signs: Blood pressure 138/82, pulse 67, temperature 98.4 F (36.9 C), temperature source Oral, resp. rate 18, height  (1.905 m), weight 99.2 kg (218 lb 11.1 oz), SpO2 98 %. No results found. Results for orders placed or performed during the hospital encounter of 11/17/16 (from the past 72 hour(s))  Glucose, capillary     Status: Abnormal   Collection Time: 11/19/16 11:52 AM  Result Value Ref Range   Glucose-Capillary 103 (H) 65 - 99 mg/dL  Glucose, capillary     Status: Abnormal   Collection Time: 11/19/16  4:40 PM  Result Value Ref Range   Glucose-Capillary 132 (H) 65 - 99 mg/dL  Glucose, capillary     Status: Abnormal   Collection Time: 11/19/16  8:00 PM  Result Value Ref Range   Glucose-Capillary 121 (H) 65 - 99 mg/dL  Glucose, capillary     Status: Abnormal   Collection Time: 11/20/16  6:34 AM  Result Value Ref Range   Glucose-Capillary 121 (H) 65 - 99 mg/dL  Glucose, capillary     Status: Abnormal   Collection Time: 11/20/16 11:55 AM  Result Value Ref Range   Glucose-Capillary 158 (H) 65 - 99 mg/dL  Glucose, capillary     Status: Abnormal   Collection Time: 11/20/16  4:23 PM  Result Value Ref Range   Glucose-Capillary 123 (H) 65 - 99 mg/dL  Glucose, capillary     Status: Abnormal   Collection Time: 11/20/16  8:29 PM  Result Value Ref Range   Glucose-Capillary 115 (H) 65 - 99 mg/dL  Glucose, capillary     Status: None   Collection Time: 11/21/16  6:44 AM  Result Value Ref Range   Glucose-Capillary 95 65 - 99 mg/dL  Glucose, capillary     Status: Abnormal   Collection Time: 11/21/16 11:39 AM  Result Value Ref Range   Glucose-Capillary 117 (H) 65 - 99 mg/dL  Glucose, capillary     Status: Abnormal   Collection Time: 11/21/16  4:49 PM  Result Value Ref Range    Glucose-Capillary 105 (H) 65 - 99 mg/dL  Glucose, capillary     Status: Abnormal   Collection Time: 11/21/16  8:51 PM  Result Value Ref Range   Glucose-Capillary 160 (H) 65 - 99 mg/dL     HEENT: normocephalic, atraumatic Cardio: RRR and No JVD Resp: CTA B/L and unlabored GI: BS positive and ND Skin:   Wound C/D/I and Right AKA staples intact - no drainage Neuro:  Motor 3/5 delt, 4/5 Right bi, tri grip 5/5 LUE and LLE Dysarthric  Musc/Skel:  Left AKA Gen NAD,  Well-developed.   Assessment/Plan: 1. 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 Physiatrist is providing close team supervision and 24 hour management of active medical problems listed below. Physiatrist and rehab team continue to assess barriers to discharge/monitor patient progress toward functional and medical goals. FIM: Function - Bathing Bathing activity did not occur: Refused Position: Wheelchair/chair at sink Body parts bathed by patient: Right arm, Left arm, Chest, Abdomen, Front perineal area, Buttocks, Left upper leg, Left lower leg Body parts bathed by helper: Back Bathing not applicable: Right lower leg, Left lower leg Assist Level:  Touching or steadying assistance(Pt > 75%), Set up Set up : To obtain items  Function- Upper Body Dressing/Undressing What is the patient wearing?: Hospital gown Pull over shirt/dress - Perfomed by patient: Thread/unthread right sleeve, Thread/unthread left sleeve, Put head through opening, Pull shirt over trunk Assist Level: Set up Set up : To obtain clothing/put away Function - Lower Body Dressing/Undressing What is the patient wearing?: Non-skid slipper socks Position: Sitting EOB Underwear - Performed by helper: Thread/unthread right underwear leg, Thread/unthread left underwear leg, Pull underwear up/down Pants-  Performed by patient: Thread/unthread right pants leg, Pull pants up/down, Thread/unthread left pants leg Pants- Performed by helper: Thread/unthread left pants leg Non-skid slipper socks- Performed by patient: Don/doff right sock Assist for footwear: Setup Assist for lower body dressing: Supervision or verbal cues  Function - Toileting Toileting steps completed by patient: Adjust clothing prior to toileting, Performs perineal hygiene, Adjust clothing after toileting Toileting Assistive Devices: Grab bar or rail Assist level: Touching or steadying assistance (Pt.75%)  Function - Archivist transfer assistive device: Grab bar Assist level to toilet: Touching or steadying assistance (Pt > 75%) Assist level from toilet: Touching or steadying assistance (Pt > 75%)  Function - Chair/bed transfer Chair/bed transfer method: Stand pivot Chair/bed transfer assist level: Touching or steadying assistance (Pt > 75%) Chair/bed transfer assistive device: Armrests, Bedrails Chair/bed transfer details: Tactile cues for sequencing, Tactile cues for weight shifting, Tactile cues for posture, Tactile cues for placement  Function - Locomotion: Wheelchair Will patient use wheelchair at discharge?: Yes Type: Manual Max wheelchair distance: 150 ft Assist Level: No help, No cues, assistive device, takes more than reasonable amount of time Assist Level: No help, No cues, assistive device, takes more than reasonable amount of time Assist Level: No help, No cues, assistive device, takes more than reasonable amount of time Turns around,maneuvers to table,bed, and toilet,negotiates 3% grade,maneuvers on rugs and over doorsills: No Function - Locomotion: Ambulation Assistive device: Walker-rolling Max distance: 8 Assist level: Touching or steadying assistance (Pt > 75%) Walk 10 feet activity did not occur: Safety/medical concerns Walk 50 feet with 2 turns activity did not occur: Safety/medical  concerns Walk 150 feet activity did not occur: Safety/medical concerns Walk 10 feet on uneven surfaces activity did not occur: Safety/medical concerns  Function - Comprehension Comprehension: Auditory Comprehension assist level: Follows basic conversation/direction with extra time/assistive device  Function - Expression Expression: Verbal Expression assist level: Expresses basic 75 - 89% of the time/requires cueing 10 - 24% of the time. Needs helper to occlude trach/needs to repeat words.  Function - Social Interaction Social Interaction assist level: Interacts appropriately 75 - 89% of the time - Needs redirection for appropriate language or to initiate interaction.  Function - Problem Solving Problem solving assist level: Solves basic 75 - 89% of the time/requires cueing 10 - 24% of the time  Function - Memory Memory assist level: Recognizes or recalls 75 - 89% of the time/requires cueing 10 - 24% of the time Patient normally able to recall (first 3 days only): Current season, Location of own room, Staff names and faces, That he or she is in a hospital  Medical Problem List and Plan: 1.  Gait abnormality, limitations with transfers secondary to right AKA.  Cont CIR PT, OT- plan for d/c this week 2.  DVT Prophylaxis/Anticoagulation: Pharmaceutical: Lovenox 3. Pain Management: hydrocodone effective. Added  gabapentin for phantom pain 4. Mood: LCSW to follow for evaluation and support.  Neuropsych to eval  5. Neuropsych:  This patient is  capable of making decisions on his own behalf. 6. Skin/Wound Care: monitor wound daily for healing.surgical wound healing well no drainage but bandage changed this am 7. Fluids/Electrolytes/Nutrition: Monitor I/Os 8. New diagnosis DM:  Hgb A1c- 6.6. Educate patient on CM diet. Monitor BS ac/hs and use SSI for elevated BS and to promote wound healing.    CBG (last 3) -controlled 4/9  Recent Labs  11/21/16 1139 11/21/16 1649 11/21/16 2051   GLUCAP 117* 105* 160*     9. ABLA: added iron supplement.  10.  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

## 2016-11-23 ENCOUNTER — Inpatient Hospital Stay (HOSPITAL_COMMUNITY): Payer: Medicaid Other | Admitting: Physical Therapy

## 2016-11-23 ENCOUNTER — Inpatient Hospital Stay (HOSPITAL_COMMUNITY): Payer: Medicaid Other | Admitting: Occupational Therapy

## 2016-11-23 LAB — GLUCOSE, CAPILLARY
GLUCOSE-CAPILLARY: 129 mg/dL — AB (ref 65–99)
Glucose-Capillary: 113 mg/dL — ABNORMAL HIGH (ref 65–99)
Glucose-Capillary: 96 mg/dL (ref 65–99)
Glucose-Capillary: 143 mg/dL — ABNORMAL HIGH (ref 65–99)

## 2016-11-23 MED ORDER — TRAMADOL HCL 50 MG PO TABS
50.0000 mg | ORAL_TABLET | Freq: Four times a day (QID) | ORAL | Status: DC | PRN
  Administered 2016-11-23 – 2016-11-24 (×3): 50 mg via ORAL
  Filled 2016-11-23 (×3): qty 1

## 2016-11-23 MED ORDER — GABAPENTIN 100 MG PO CAPS: 100.0000 mg | ORAL_CAPSULE | Freq: Three times a day (TID) | ORAL | 0 refills | Status: DC

## 2016-11-23 MED ORDER — GABAPENTIN 100 MG PO CAPS
100.0000 mg | ORAL_CAPSULE | Freq: Three times a day (TID) | ORAL | Status: DC
  Administered 2016-11-23 – 2016-11-24 (×4): 100 mg via ORAL
  Filled 2016-11-23 (×3): qty 1

## 2016-11-23 MED ORDER — NORTRIPTYLINE HCL 25 MG PO CAPS
25.0000 mg | ORAL_CAPSULE | Freq: Every day | ORAL | Status: DC
  Administered 2016-11-23: 25 mg via ORAL
  Filled 2016-11-23: qty 1

## 2016-11-23 MED ORDER — POLYSACCHARIDE IRON COMPLEX 150 MG PO CAPS: 150.0000 mg | ORAL_CAPSULE | Freq: Two times a day (BID) | ORAL | 0 refills | Status: DC

## 2016-11-23 MED ORDER — SENNOSIDES-DOCUSATE SODIUM 8.6-50 MG PO TABS: 2.0000 | ORAL_TABLET | Freq: Two times a day (BID) | ORAL | 0 refills | Status: DC

## 2016-11-23 MED ORDER — PANTOPRAZOLE SODIUM 40 MG PO TBEC: 40.0000 mg | DELAYED_RELEASE_TABLET | Freq: Every day | ORAL | 0 refills | Status: DC

## 2016-11-23 MED ORDER — NORTRIPTYLINE HCL 25 MG PO CAPS: 25.0000 mg | ORAL_CAPSULE | Freq: Every day | ORAL | 0 refills | Status: DC

## 2016-11-23 MED ORDER — METHOCARBAMOL 500 MG PO TABS: 500.0000 mg | ORAL_TABLET | Freq: Four times a day (QID) | ORAL | 0 refills | Status: DC | PRN

## 2016-11-23 MED ORDER — ATORVASTATIN CALCIUM 40 MG PO TABS: 40.0000 mg | ORAL_TABLET | Freq: Every day | ORAL | 0 refills | Status: DC

## 2016-11-23 NOTE — Progress Notes (Signed)
Occupational Therapy Discharge Summary  Patient Details  Name: Ronald Forbes MRN: [id number] Date of Birth: [birth year]   Patient has met 9 of 9 long term goals due to improved activity tolerance, improved balance, ability to compensate for deficits and improved coordination.  Patient to discharge at overall Supervision level.  Pt with plans to d/c home with assist from sister. Pt's family has not been present for any family education. They report being able to provide needed assist at d/c.  Patient with very limited participation while on CIR. Pt refusing most tx sessions, stating he "already knows how to do all this stuff" and declining most therapy sessions. Recommending supervision at home with use of RW for transfers and use of tub transfer bench for bathing.  Pt with poor safety awareness with decreased awareness of deficits. He participates best when having perceived since of control.   Reasons goals not met: Per pt report, he has met all tx goals. Pt with very limited participation in therapy and therefore limited OT intervention. Pt not truly receptive to cuing and education provided by therapist for increased independence and safety.   Recommendation:  Patient will benefit from ongoing skilled OT services in home health setting to continue to advance functional skills in the area of BADL and Reduce care partner burden.  Equipment: Pt has tub transfer bench, no other OT DME required  Reasons for discharge: discharge from hospital  Patient/family agrees with progress made and goals achieved: Yes  OT Discharge Precautions/Restrictions  Precautions Precautions: Fall Precaution Comments: h/o R hemi due to CVA in June 2017 Restrictions Weight Bearing Restrictions: Yes RLE Weight Bearing: Non weight bearing ADL ADL ADL Comments: Please see functional navigator Vision/Perception  Vision- History Patient Visual Report: No change from baseline Vision- Assessment Vision Assessment?:  Vision impaired- to be further tested in functional context Additional Comments: pt reports he can't read therapist's name badge regardless of proximity to eyes  Cognition Overall Cognitive Status: History of cognitive impairments - at baseline (prior CVA) Arousal/Alertness: Awake/alert Orientation Level: Oriented to person;Disoriented to time;Disoriented to place;Oriented to situation Memory: Impaired Memory Impairment: Decreased recall of new information Awareness: Impaired Awareness Impairment: Emergent impairment Problem Solving: Impaired Behaviors: Poor frustration tolerance;Impulsive Sensation Sensation Light Touch: Appears Intact Additional Comments: improving tolerance to tactile stimulation on RLE Coordination Gross Motor Movements are Fluid and Coordinated: No Fine Motor Movements are Fluid and Coordinated: No Coordination and Movement Description: baseline R hemi Motor  Motor Motor: Hemiplegia Motor - Discharge Observations: baseline R hemiplegia Mobility  Bed Mobility Bed Mobility: Supine to Sit;Sit to Supine Supine to Sit: 6: Modified independent (Device/Increase time) Sit to Supine: 6: Modified independent (Device/Increase time) Transfers Sit to Stand: 5: Supervision Stand to Sit: 5: Supervision  Trunk/Postural Assessment  Cervical Assessment Cervical Assessment: Within Functional Limits Thoracic Assessment Thoracic Assessment: Exceptions to [facility] (R trunk shortening) Lumbar Assessment Lumbar Assessment: Exceptions to [facility] (posterior pelvic tilt) Postural Control Postural Control: Deficits on evaluation Trunk Control: impaired Protective Responses: delayed  Balance Balance Balance Assessed: Yes Static Sitting Balance Static Sitting - Level of Assistance: 6: Modified independent (Device/Increase time) Dynamic Sitting Balance Dynamic Sitting - Level of Assistance: 6: Modified independent (Device/Increase time) Static Standing Balance Static Standing - Level  of Assistance: 5: Stand by assistance Dynamic Standing Balance Dynamic Standing - Level of Assistance: 5: Stand by assistance Extremity/Trunk Assessment RUE Assessment RUE Assessment: Exceptions to [facility] RUE Strength RUE Overall Strength Comments: R hemi from previouse CVA, flexor tone, some AROM shoulder/elbow, weak  grasp  Right Hand Gross Grasp: Impaired RUE Tone Modified Ashworth Scale for Grading Hypertonia RUE: More marked increase in muscle tone through most of the ROM, but affected part(s) easily moved LUE Assessment LUE Assessment: Within Functional Limits   See Function Navigator for Current Functional Status.  Ronald Forbes C 11/23/2016, 3:08 PM

## 2016-11-23 NOTE — Progress Notes (Signed)
Occupational Therapy Note  Patient Details  Name: Mesiah Manzo MRN: [id number] Date of Birth: [birth year]  Therapist arrived at 11:00 for tx session. Pt voiced having not slept well over night and denying OOB tx and therapy this morning. Will attempt to make up time as able.    [name], Rulon Abdalla C 11/23/2016, 12:05 PM

## 2016-11-23 NOTE — Progress Notes (Signed)
Recreational Therapy Session Note  Patient Details  Name: Ronald Forbes MRN: [id number] Date of Birth: 1961-05-01 Today's Date: 11/23/2016  Eval deferred.  Pt with limited participation in therapy per team & potential discharge 4/12. Will continue to monitor. Shanaiya Bene 11/23/2016, 9:11 AM

## 2016-11-23 NOTE — Progress Notes (Addendum)
Physical Therapy Discharge Summary  Patient Details  Name: Ronald Forbes MRN: [id number] Date of Birth: Jan 30, 1961  Today's Date: 11/23/2016 PT Individual Time: 1119-1150 and 1525-1540 PT Individual Time Calculation (min): 31 min  and 15 minutes Today's Date: 11/23/2016 PT Missed Time: 14 Minutes, and 30 minutes Missed Time Reason: Pain   Patient has met 9 of 10 long term goals due to improved activity tolerance, improved balance, decreased pain and improved coordination.  Patient to discharge at a wheelchair level Modified Independent, but will require supervision for any mobility (transfers and gait) using RW.   Patient's care partner was not present for family education, so unsure whether they will be able to provide the necessary physical and cognitive assistance at discharge.  Reasons goals not met: Pt continues to demo poor safety awareness with standing tasks and requires supervision for dynamic standing balance.   Recommendation:  Patient will benefit from ongoing skilled PT services in home health setting to continue to advance safe functional mobility, address ongoing impairments in balance, coordination, and strength, and minimize fall risk.  Equipment: 20x18 w/c with R amputee support pad, RW  Reasons for discharge: treatment goals met and discharge from hospital  Patient/family agrees with progress made and goals achieved: Yes   Skilled Therapeutic Intervention: Session 1: pt initially declining session 2/2 fatigue and decreased pain, but agreeable with encouragement to participate for discharge assessment.  See below for details.  Pt currently requiring supervision for mobility with RW due to decreased safety awareness/balance.  Pt mod I for basic squat/pivot transfers and w/c mobility.  Pt returned to room at end of session and left upright in w/c with call bell in reach and needs met.   Session 2: pt declining therapy session stating he doesn't need any help.  PT adjusted  pt's w/c and discussed use of amputee support pad on R for improved sitting tolerance and reduced risk of skin break down.  Pt minimally receptive to education.  Left sitting EOB with call bell in reach and needs met.   PT Discharge Precautions/Restrictions Precautions Precautions: Fall Precaution Comments: h/o R hemi due to CVA in June 2017 Restrictions Weight Bearing Restrictions: Yes RLE Weight Bearing: Non weight bearing Pain Pain Assessment Pain Assessment: 0-10 Pain Score: 8  Pain Location: Leg Pain Orientation: Right Pain Intervention(s): RN made aware;Emotional support Vision/Perception  Vision - Assessment Additional Comments: pt reports he can't read therapist's name badge regardless of proximity to eyes  Cognition Overall Cognitive Status: History of cognitive impairments - at baseline (prior CVA) Arousal/Alertness: Awake/alert Orientation Level: Oriented to person;Disoriented to time;Disoriented to place;Oriented to situation Memory: Impaired Memory Impairment: Decreased recall of new information Awareness: Impaired Awareness Impairment: Emergent impairment Problem Solving: Impaired Behaviors: Poor frustration tolerance;Impulsive Sensation Sensation Light Touch: Appears Intact Additional Comments: improving tolerance to tactile stimulation on RLE Coordination Gross Motor Movements are Fluid and Coordinated: No Fine Motor Movements are Fluid and Coordinated: No Coordination and Movement Description: baseline R hemi Motor  Motor Motor: Hemiplegia Motor - Discharge Observations: baseline R hemiplegia  Mobility Bed Mobility Bed Mobility: Supine to Sit;Sit to Supine Supine to Sit: 6: Modified independent (Device/Increase time) Sit to Supine: 6: Modified independent (Device/Increase time) Transfers Transfers: Yes Sit to Stand: 5: Supervision Stand to Sit: 5: Supervision Squat Pivot Transfers: 6: Modified independent (Device/Increase time) Locomotion   Ambulation Ambulation: Yes Ambulation/Gait Assistance: 5: Supervision Ambulation Distance (Feet): 15 Feet Assistive device: Rolling walker Gait Gait: Yes Gait Pattern: Impaired Gait Pattern: Poor foot clearance -  left Gait velocity: decreased Stairs / Additional Locomotion Stairs: No Architect: Yes Wheelchair Assistance: 6: Modified independent (Device/Increase time) Wheelchair Propulsion: Left lower extremity;Left upper extremity Wheelchair Parts Management: Independent Distance: 150  Trunk/Postural Assessment  Cervical Assessment Cervical Assessment: Within Functional Limits Thoracic Assessment Thoracic Assessment: Exceptions to [facility] (R trunk shortening) Lumbar Assessment Lumbar Assessment: Exceptions to [facility] (posterior pelvic tilt) Postural Control Postural Control: Deficits on evaluation Trunk Control: impaired Protective Responses: delayed  Balance Balance Balance Assessed: Yes Static Sitting Balance Static Sitting - Level of Assistance: 6: Modified independent (Device/Increase time) Dynamic Sitting Balance Dynamic Sitting - Level of Assistance: 6: Modified independent (Device/Increase time) Static Standing Balance Static Standing - Level of Assistance: 5: Stand by assistance Dynamic Standing Balance Dynamic Standing - Level of Assistance: 5: Stand by assistance Extremity Assessment      RLE Assessment RLE Assessment: Exceptions to [facility] RLE Strength RLE Overall Strength: Deficits;Due to pain;Due to premorbid status RLE Overall Strength Comments: R hip flexion 3/5, no resistance applied 2/2 pain LLE Assessment LLE Assessment: Within Functional Limits LLE Strength LLE Overall Strength Comments: 4+/5 throughout   See Function Navigator for Current Functional Status.  Ronald Forbes 11/23/2016, 12:14 PM

## 2016-11-23 NOTE — Progress Notes (Signed)
Subjective/Complaints: Sleeping poorly but less irritable this am, no bowel , bladder or pain issues that disturb sleep per pt  ROS- denies CP, SOB, N/V/D   Objective: Vital Signs: Blood pressure 140/76, pulse 75, temperature 98.9 F (37.2 C), temperature source Oral, resp. rate 16, height  (1.905 m), weight 99.2 kg (218 lb 11.1 oz), SpO2 99 %. No results found. Results for orders placed or performed during the hospital encounter of 11/17/16 (from the past 72 hour(s))  Glucose, capillary     Status: Abnormal   Collection Time: 11/20/16 11:55 AM  Result Value Ref Range   Glucose-Capillary 158 (H) 65 - 99 mg/dL  Glucose, capillary     Status: Abnormal   Collection Time: 11/20/16  4:23 PM  Result Value Ref Range   Glucose-Capillary 123 (H) 65 - 99 mg/dL  Glucose, capillary     Status: Abnormal   Collection Time: 11/20/16  8:29 PM  Result Value Ref Range   Glucose-Capillary 115 (H) 65 - 99 mg/dL  Glucose, capillary     Status: None   Collection Time: 11/21/16  6:44 AM  Result Value Ref Range   Glucose-Capillary 95 65 - 99 mg/dL  Glucose, capillary     Status: Abnormal   Collection Time: 11/21/16 11:39 AM  Result Value Ref Range   Glucose-Capillary 117 (H) 65 - 99 mg/dL  Glucose, capillary     Status: Abnormal   Collection Time: 11/21/16  4:49 PM  Result Value Ref Range   Glucose-Capillary 105 (H) 65 - 99 mg/dL  Glucose, capillary     Status: Abnormal   Collection Time: 11/21/16  8:51 PM  Result Value Ref Range   Glucose-Capillary 160 (H) 65 - 99 mg/dL  Glucose, capillary     Status: Abnormal   Collection Time: 11/22/16  6:42 AM  Result Value Ref Range   Glucose-Capillary 116 (H) 65 - 99 mg/dL   Comment 1 QC Due   Glucose, capillary     Status: Abnormal   Collection Time: 11/22/16 11:57 AM  Result Value Ref Range   Glucose-Capillary 108 (H) 65 - 99 mg/dL  Glucose, capillary     Status: Abnormal   Collection Time: 11/22/16  4:42 PM  Result Value Ref Range   Glucose-Capillary 123 (H) 65 - 99 mg/dL  Glucose, capillary     Status: Abnormal   Collection Time: 11/22/16  9:21 PM  Result Value Ref Range   Glucose-Capillary 135 (H) 65 - 99 mg/dL  Glucose, capillary     Status: Abnormal   Collection Time: 11/23/16  6:32 AM  Result Value Ref Range   Glucose-Capillary 113 (H) 65 - 99 mg/dL     HEENT: normocephalic, atraumatic Cardio: RRR and No JVD Resp: CTA B/L and unlabored GI: BS positive and ND Skin:   Wound C/D/I and Right AKA staples intact - no drainage Neuro:  Motor 3/5 delt, 4/5 Right bi, tri grip 5/5 LUE and LLE Dysarthric  Musc/Skel:  Left AKA Gen NAD,  Well-developed.   Assessment/Plan: 1. 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 Physiatrist is providing close team supervision and 24 hour management of active medical problems listed below. Physiatrist and rehab team continue to assess barriers to discharge/monitor patient progress toward functional and medical goals. FIM: Function - Bathing Bathing activity did not occur: Refused Position: Shower Body parts bathed by patient: Right arm,  Left arm, Chest, Abdomen, Front perineal area, Buttocks, Left upper leg, Left lower leg Body parts bathed by helper: Back Bathing not applicable: Right lower leg, Left lower leg Assist Level: Touching or steadying assistance(Pt > 75%), Set up, Supervision or verbal cues (safety cues to not stand) Set up : To obtain items  Function- Upper Body Dressing/Undressing What is the patient wearing?: Hospital gown, Pull over shirt/dress Pull over shirt/dress - Perfomed by patient: Thread/unthread right sleeve, Thread/unthread left sleeve, Put head through opening, Pull shirt over trunk Assist Level: Set up Set up : To obtain clothing/put away Function - Lower Body Dressing/Undressing What is  the patient wearing?: Non-skid slipper socks Position: Wheelchair/chair at sink Underwear - Performed by helper: Thread/unthread right underwear leg, Thread/unthread left underwear leg, Pull underwear up/down Pants- Performed by patient: Thread/unthread right pants leg, Pull pants up/down, Thread/unthread left pants leg Pants- Performed by helper: Thread/unthread left pants leg Non-skid slipper socks- Performed by patient: Don/doff right sock Assist for footwear: Setup Assist for lower body dressing: Supervision or verbal cues  Function - Toileting Toileting steps completed by patient: Adjust clothing prior to toileting, Performs perineal hygiene, Adjust clothing after toileting Toileting Assistive Devices: Grab bar or rail Assist level: Touching or steadying assistance (Pt.75%)  Function - Archivist transfer assistive device: Grab bar Assist level to toilet: Touching or steadying assistance (Pt > 75%) Assist level from toilet: Touching or steadying assistance (Pt > 75%)  Function - Chair/bed transfer Chair/bed transfer method: Stand pivot Chair/bed transfer assist level: Touching or steadying assistance (Pt > 75%) Chair/bed transfer assistive device: Armrests, Bedrails Chair/bed transfer details: Tactile cues for posture, Tactile cues for placement  Function - Locomotion: Wheelchair Will patient use wheelchair at discharge?: Yes Type: Manual Max wheelchair distance: 150 ft Assist Level: No help, No cues, assistive device, takes more than reasonable amount of time Assist Level: No help, No cues, assistive device, takes more than reasonable amount of time Assist Level: No help, No cues, assistive device, takes more than reasonable amount of time Turns around,maneuvers to table,bed, and toilet,negotiates 3% grade,maneuvers on rugs and over doorsills: No Function - Locomotion: Ambulation Assistive device: Walker-rolling Max distance: 5 ft Assist level: Touching or  steadying assistance (Pt > 75%) Walk 10 feet activity did not occur: Safety/medical concerns Walk 50 feet with 2 turns activity did not occur: Safety/medical concerns Walk 150 feet activity did not occur: Safety/medical concerns Walk 10 feet on uneven surfaces activity did not occur: Safety/medical concerns  Function - Comprehension Comprehension: Auditory Comprehension assist level: Follows basic conversation/direction with extra time/assistive device  Function - Expression Expression: Verbal Expression assist level: Expresses basic 75 - 89% of the time/requires cueing 10 - 24% of the time. Needs helper to occlude trach/needs to repeat words.  Function - Social Interaction Social Interaction assist level: Interacts appropriately 50 - 74% of the time - May be physically or verbally inappropriate.  Function - Problem Solving Problem solving assist level: Solves basic 50 - 74% of the time/requires cueing 25 - 49% of the time  Function - Memory Memory assist level: Recognizes or recalls 50 - 74% of the time/requires cueing 25 - 49% of the time Patient normally able to recall (first 3 days only): Current season, Location of own room, Staff names and faces, That he or she is in a hospital  Medical Problem List and Plan: 1.  Gait abnormality, limitations with transfers secondary to right AKA.  Cont CIR PT, OT- plan for d/c this week  We discussed that due to Right hemiparesis it will be more difficult to use a prothesis and if this is a goal, he will have to work very hard in therapy 2.  DVT Prophylaxis/Anticoagulation: Pharmaceutical: Lovenox 3. Pain Management: hydrocodone effective. Increased   gabapentin for phantom pain 4. Mood: LCSW to follow for evaluation and support.  Neuropsych eval appreciated 5. Neuropsych: This patient is  capable of making decisions on his own behalf. 6. Skin/Wound Care: monitor wound daily for healing.surgical wound healing well no drainage but bandage  changed this am 7. Fluids/Electrolytes/Nutrition: Monitor I/Os 8. New diagnosis DM:  Hgb A1c- 6.6. Educate patient on CM diet. Monitor BS ac/hs and use SSI for elevated BS and to promote wound healing.    CBG (last 3) -controlled 4/9  Recent Labs  11/22/16 1642 11/22/16 2121 11/23/16 0632  GLUCAP 123* 135* 113*     9. ABLA: added iron supplement.  10.  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  Insomnia, not due to bowel or bladder issues, also doesn't appear to be pain related although s there is occ shooting pain to "knee" on RIght , change trazodone to nortriptyline LOS (Days) 6 A FACE TO FACE EVALUATION WAS PERFORMED  Keyler Hoge E 11/23/2016, 7:59 AM

## 2016-11-23 NOTE — Discharge Summary (Signed)
Physician Discharge Summary  Patient ID: Ronald Forbes MRN: [id number] DOB/AGE: Jun 07, 1961 56 y.o.  Admit date: 11/17/2016 Discharge date: 11/24/2016  Discharge Diagnoses:  Principal Problem:   Unilateral AKA, right (HCC) Active Problems:   History of CVA with residual deficit   Phantom limb pain (HCC)   Type 2 diabetes mellitus with diabetic peripheral angiopathy without gangrene, without long-term current use of insulin (HCC)   Spastic hemiparesis of right 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 [facility])   Discharged Condition: stable   Significant Diagnostic Studies: N/A   Labs:  Basic Metabolic Panel:  Recent Labs Lab 11/18/16 0557 11/24/16 0537  NA 137 140  K 3.7 3.8  CL 104 107  CO2 24 23  GLUCOSE 128* 114*  BUN 6 14  CREATININE 0.56* 0.58*  CALCIUM 8.7* 8.9    CBC:  Recent Labs Lab 11/18/16 0557 11/24/16 0537  WBC 7.3 5.9  NEUTROABS 4.8  --   HGB 11.5* 11.6*  HCT 34.3* 35.3*  MCV 90.5 91.2  PLT 169 257    CBG:  Recent Labs Lab 11/23/16 1123 11/23/16 1642 11/23/16 2106 11/24/16 0634 11/24/16 1126  GLUCAP 96 129* 143* 114* 153*    Brief HPI:   [name] a 56 y.o. malewith history of CVA with residual right sided weakness, morbid obesity, ongoing tobacco and alcohol abuse, PVD s/p femoral to anterior tibial bypass graft 10/25/16 who was readmitted on 11/10/16 with right foot pain and swelling. He was found to have occluded right leg bypass and was admitted for pain control and amputation recommended. He underwent R-AKA on 4/2 by Dr. Darrick Penna and therapy initiated. Patient with deficits in mobility as well as decrease in ability to carry out ADL tasks. CIR recommended for follow up therapy   Hospital Course: Ronald Forbes was admitted to rehab 11/17/2016 for inpatient therapies to consist of PT and OT at least three hours five days a week. Past admission physiatrist, therapy team and rehab RN have worked together to provide customized collaborative  inpatient rehab.  Right AKA site has healed well and staples remain intake. Blood pressures have been controlled and he has been afebrile. Niferex was added to help with ABLA.  He was newly diagnosed with diabetes and dietician was consulted and has educated patient on appropriate diet. His blood sugars have been monitored with ac/hs checks and have been reasonably controlled on modified diet. SSI was used for occasion elevation.  He continued to have issues with pain control and was educated on importance of desensitization techniques. Neurontin was added and titrated to tid. Vicodin is in process of being weaned off and he was advised to use tramadol in between to help spread out pain meds. and low dose He continued to have poor participation and refused multiple therapy sessions. Patient and family did not feel that family education was needed prior to discharge. Advanced home care to provide [facility] to monitor wound healing and medication compliance.   During patient's stay in rehab weekly team conferences were held to monitor patient's progress, set goals and discuss barriers to discharge. At admission patient required mod/max assist with ADL tasks and min assist with mobility. He has had improvement in activity tolerance, balance, postural control, as well as ability to compensate for deficits.  He is able to complete ADL tasks with supervision. He requires supervision for  24 hours supervision is recommended due to poor safety awareness and    Disposition: Home   Diet: Diabetic diet.   Special Instructions: 1.  Keep  Incision clean and dry.   Discharge Instructions    Ambulatory referral to Physical Medicine Rehab    Complete by:  As directed    1-2 weeks transitional care appt     Allergies as of 11/24/2016   No Known Allergies     Medication List    STOP taking these medications   oxyCODONE-acetaminophen 5-325 MG tablet Commonly known as:  PERCOCET/ROXICET     TAKE these  medications   aspirin 81 MG chewable tablet Chew 1 tablet (81 mg total) by mouth daily.   atorvastatin 40 MG tablet Commonly known as:  LIPITOR Take 1 tablet (40 mg total) by mouth daily at 6 PM.   gabapentin 100 MG capsule Commonly known as:  NEURONTIN Take 1 capsule (100 mg total) by mouth 3 (three) times daily.   HYDROcodone-acetaminophen 5-325 MG tablet--Rx # 60 pills Commonly known as:  NORCO/VICODIN Take 1-2 tablets by mouth every 8 (eight) hours as needed for moderate pain. See instructions to wean narcotics. Notes to patient:  Starting 11/29/16-- decrease pain medication to one pill every 8 hours ( or three times a day)  as needed for pain.  Starting 4/21--decrease pain medication to one pill every 12 hours or twice a day as needed. Starting  4/26 -further decrease to one pill daily as needed.    iron polysaccharides 150 MG capsule Commonly known as:  NIFEREX Take 1 capsule (150 mg total) by mouth 2 (two) times daily.   methocarbamol 500 MG tablet Commonly known as:  ROBAXIN Take 1 tablet (500 mg total) by mouth every 6 (six) hours as needed for muscle spasms.   nortriptyline 25 MG capsule Commonly known as:  PAMELOR Take 1 capsule (25 mg total) by mouth at bedtime.   pantoprazole 40 MG tablet Commonly known as:  PROTONIX Take 1 tablet (40 mg total) by mouth daily.   senna-docusate 8.6-50 MG tablet Commonly known as:  Senokot-S Take 2 tablets by mouth 2 (two) times daily.   traMADol 50 MG table--Rx # 90 pills Commonly known as:  ULTRAM Take 1 tablet (50 mg total) by mouth every 6 (six) hours as needed for moderate pain. Notes to patient:  Use this in between hydrocodone to help with pain control and to help wean off narcotic      Follow-up Information    Erick Colace, MD Follow up.   Specialty:  Physical Medicine and Rehabilitation Why:  Office will call you with follow up appointment Contact information: [address] [place]  [address] [phone]        Fabienne Bruns, MD Follow up.   Specialties:  Vascular Surgery, Cardiology Contact information: [address] [place] [address] [phone]        [facility] Follow up on 12/03/2016.   Specialty:  Internal Medicine Why:  APPOINTMENT @ 10:45 NEEDS TO BE THERE @ 10:30 AM Contact information: [address] [place] [address] [phone]           Signed: Jacquelynn Cree 11/24/2016, 1:29 PM

## 2016-11-23 NOTE — Progress Notes (Signed)
Occupational Therapy Note  Patient Details  Name: Ronald Forbes MRN: [id number] Date of Birth: 07-08-1961  Today's Date: 11/23/2016 OT Individual Time: 1000-1015 OT Individual Time Calculation (min): 15 min  and Today's Date: 11/23/2016 OT Missed Time: 45 Minutes Missed Time Reason: Patient unwilling/refused to participate without medical reason;Patient fatigue  Upon entering the room, pt supine in bed and immediately states, " I don't feel good and I'm not doing it." OT inquired further and pt stating he did not sleep well and was having phantom leg pain throughout the night. OT providing education regarding tapping and rubbing to residual limb to assist with pain management. Pt declined further education, exercises, and functional tasks. Pt refusing all forms of therapeutic intervention this session after education provided. Pt remaining in bed with bed alarm activated and call bell within reach.    Jackquline Denmark P 11/23/2016, 10:21 AM

## 2016-11-23 NOTE — Progress Notes (Signed)
Social Work Patient ID: Ronald Forbes, male   DOB: November 22, 1960, 56 y.o.   MRN: [id number]  Team feels pt will be ready for discharge tomorrow if medically stable from MD standpoint.  Will order equipment and have discussed with pt transport home since will have equipment to take home. Have contacted brother and left a message and sister in-law who Reports she will talk with husband tonight and contact this worker tomorrow am to inform of plan for home. Pt's sister is coming Friday to move in with pt and assist him. Informed sister in-law pt is mod/I from a wheelchair level. Pt aware and wanted worker to contact family regarding transport home.

## 2016-11-23 NOTE — Patient Care Conference (Signed)
Inpatient RehabilitationTeam Conference and Plan of Care Update Date: 11/24/2016   Time: 11:00 AM    Patient Name: Ronald Forbes      Medical Record Number: [id number]  Date of Birth: 02-Aug-1961 Sex: Male         Room/Bed: 4M06C/4M06C-01 Payor Info: Payor: MEDICAID [place] / Plan: MEDICAID [place] ACCESS / Product Type: *No Product type* /    Admitting Diagnosis: R AKA  Admit Date/Time:  11/17/2016  6:01 PM Admission Comments: No comment available   Primary Diagnosis:  Unilateral AKA, right (HCC) Principal Problem: Unilateral AKA, right [facility])  Patient Active Problem List   Diagnosis Date Noted  . Type 2 diabetes mellitus with diabetic peripheral angiopathy without gangrene, without long-term current use of insulin (HCC)   . Spastic hemiparesis of right 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 (HCC)   . History of CVA with residual deficit   . Phantom limb pain (HCC)   . 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 Unilateral AKA, right (HCC)   . Diabetic peripheral neuropathy (HCC)   . Diabetes mellitus type 2 in nonobese (HCC)   . Reactive hypertension   . Type 2 diabetes mellitus with peripheral neuropathy (HCC)   . Benign essential HTN   . Hypokalemia   . Hypoalbuminemia due to protein-calorie malnutrition (HCC)   . Acute blood loss anemia   . Debilitated 10/28/2016  . Ischemia of right lower extremity 10/25/2016  . PAD (peripheral artery disease) (HCC) 10/20/2016  . Thyroid nodule 02/29/2016  . 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 Cerebrovascular accident (CVA) due to embolism of left posterior cerebral artery (HCC)   . Diabetes mellitus type 2 in obese (HCC) 02/07/2016  . HLD (hyperlipidemia)   . Tobacco abuse 02/04/2016  . Alcohol use disorder (HCC) 02/04/2016  . Morbid obesity (HCC) 02/04/2016  . CVA (cerebral vascular accident) (HCC) 02/04/2016    Expected Discharge Date: Expected Discharge Date: 11/24/16  Team Members Present: Physician leading  conference: Dr. Claudette Laws Social Worker Present: Dossie Der, LCSW Nurse Present: Chana Bode, RN PT Present: Teodoro Kil, PT OT Present: Kearney Hard, OT SLP Present: Feliberto Gottron, SLP PPS Coordinator present : Tora Duck, RN, CRRN     Current Status/Progress Goal Weekly Team Focus  Medical   Right AKA, Right hemiparesis  Maintain wound healing, reduce edema  d/c planning   Bowel/Bladder   Continent B/B LBM 4/10  Remain continent   assess and treat as needed   Swallow/Nutrition/ Hydration             ADL's   supervision with bathroom transfers, set up BADL   supervision with toilet transfers, tub transfers, bathing; otherwise mod I   pt ed, ADL training   Mobility   mod I>supervision  mod I>supervision  d/c planning, d/c on Wednesday    Communication             Safety/Cognition/ Behavioral Observations  min assist   min assist   keep call bell and belongings within reach   Pain   Controlled to 2/3 ouf of 10 with 2 5/325 vicodin and  Robaxin   less than 3 out of 10  assess and treat q shift/PRN   Skin   Rt leg incision wrapped kerlix/gauze  No new skin breakdon, No s/s of infection  monitor and assess q shift      *See Care Plan and progress notes for long and short-term goals.  Barriers to Discharge: none  Possible Resolutions to Barriers:  d/c home today    Discharge Planning/Teaching Needs:    Home mod/I level sister from [place] moving in with pt and will be able to assist him. But not coming until Friday 4/13.     Team Discussion:  Reached goals of mod/I wheelchair level and supervision with ambulation due to some safety issues. Sister coming Friday to move in with pt and assist with his care. Pt ready to go. Medically stable for DC today.  Revisions to Treatment Plan:  DC 4/11   Continued Need for Acute Rehabilitation Level of Care: The patient requires daily medical management by a physician with specialized training in physical  medicine and rehabilitation for the following conditions: Daily direction of a multidisciplinary physical rehabilitation program to ensure safe treatment while eliciting the highest outcome that is of practical value to the patient.: Yes Daily medical management of patient stability for increased activity during participation in an intensive rehabilitation regime.: Yes Daily analysis of laboratory values and/or radiology reports with any subsequent need for medication adjustment of medical intervention for : Post surgical problems;Neurological problems  Ronald Forbes 11/24/2016, 11:57 AM

## 2016-11-23 NOTE — Progress Notes (Signed)
Occupational Therapy Session Note  Patient Details  Name: Ronald Forbes MRN: [id number] Date of Birth: September 13, 1960  Today's Date: 11/23/2016 OT Individual Time: 6644-0347 OT Individual Time Calculation (min): 23 min    Short Term Goals: Week 1:  OT Short Term Goal 1 (Week 1): LTG=STG 2/2 estimated short LOS  Skilled Therapeutic Interventions/Progress Updates:    Pt seen for OT session focusing on functional transfer. Pt in supine upon arrival, required increased encouragement for participation, but reluctantly agreed to practice toilet transfers.  Squat pivot throughout session with RW. Pt with decreased safety awareness/ concern, however, was able to safely complete each transfer. Completed x4 toilet <> w/c transfers throughout session, Initially completed via squat pivot with grab bars. Made pt aware he would not have grab bars at home and therefore needed to find some other way to steady himself. Eventually, pt willing to complete stand pivot with RW. Completed with supervision and simulated clothing management. Pt agrees that RW is best option for transfer method.  Adjusted pt's personal RW which was delivered to unit for pt's height, educated regarding importance of properly fitting equipment as well as need to keep B w/c leg rests for when pt has prothesis, he voiced understanding.  He declined all further tx, declining wanting to change clothes, practice tub/shower transfer as he has at home, or complete any grooming tasks.  Pt left sitting EOB at end of session, all needs in reach.   Therapy Documentation Precautions:  Precautions Precautions: Fall Precaution Comments: h/o R hemi due to CVA in June 2017 Restrictions Weight Bearing Restrictions: Yes RLE Weight Bearing: Non weight bearing Pain:   No/ denies pain ADL: ADL ADL Comments: Please see functional navigator  See Function Navigator for Current Functional Status.   Therapy/Group: Individual Therapy  [name], Rayman Petrosian  C 11/23/2016, 7:16 AM

## 2016-11-24 LAB — BASIC METABOLIC PANEL
ANION GAP: 10 (ref 5–15)
BUN: 14 mg/dL (ref 6–20)
CALCIUM: 8.9 mg/dL (ref 8.9–10.3)
CO2: 23 mmol/L (ref 22–32)
CREATININE: 0.58 mg/dL — AB (ref 0.61–1.24)
Chloride: 107 mmol/L (ref 101–111)
GFR calc non Af Amer: >60 mL/min (ref >60)
Glucose, Bld: 114 mg/dL — ABNORMAL HIGH (ref 65–99)
Potassium: 3.8 mmol/L (ref 3.5–5.1)
Sodium: 140 mmol/L (ref 135–145)

## 2016-11-24 LAB — CBC
HCT: 35.3 % — ABNORMAL LOW (ref 39.0–52.0)
Hemoglobin: 11.6 g/dL — ABNORMAL LOW (ref 13.0–17.0)
MCH: 30 pg (ref 26.0–34.0)
MCHC: 32.9 g/dL (ref 30.0–36.0)
MCV: 91.2 fL (ref 78.0–100.0)
PLATELETS: 257 10*3/uL (ref 150–400)
RBC: 3.87 MIL/uL — ABNORMAL LOW (ref 4.22–5.81)
RDW: 13 % (ref 11.5–15.5)
WBC: 5.9 10*3/uL (ref 4.0–10.5)

## 2016-11-24 LAB — GLUCOSE, CAPILLARY
Glucose-Capillary: 114 mg/dL — ABNORMAL HIGH (ref 65–99)
Glucose-Capillary: 153 mg/dL — ABNORMAL HIGH (ref 65–99)

## 2016-11-24 MED ORDER — TRAMADOL HCL 50 MG PO TABS: 50.0000 mg | ORAL_TABLET | Freq: Four times a day (QID) | ORAL | 0 refills | Status: DC | PRN

## 2016-11-24 MED ORDER — METHOCARBAMOL 500 MG PO TABS: 500.0000 mg | ORAL_TABLET | Freq: Four times a day (QID) | ORAL | 0 refills | Status: DC | PRN

## 2016-11-24 MED ORDER — HYDROCODONE-ACETAMINOPHEN 5-325 MG PO TABS: 1.0000 | ORAL_TABLET | Freq: Three times a day (TID) | ORAL | 0 refills | Status: DC | PRN

## 2016-11-24 MED ORDER — GABAPENTIN 100 MG PO CAPS: 100.0000 mg | ORAL_CAPSULE | Freq: Three times a day (TID) | ORAL | 0 refills | Status: DC

## 2016-11-24 MED ORDER — NORTRIPTYLINE HCL 25 MG PO CAPS: 25.0000 mg | ORAL_CAPSULE | Freq: Every day | ORAL | 0 refills | Status: DC

## 2016-11-24 NOTE — Progress Notes (Signed)
Subjective/Complaints:  Slept much better, switched from trazodone to nortriptyline  ROS- denies CP, SOB, N/V/D   Objective: Vital Signs: Blood pressure (!) 140/92, pulse 69, temperature 97.9 F (36.6 C), temperature source Oral, resp. rate 16, height 6' 3"  (1.905 m), weight 98 kg (216 lb 0.8 oz), SpO2 99 %. No results found. Results for orders placed or performed during the hospital encounter of 11/17/16 (from the past 72 hour(s))  Glucose, capillary     Status: Abnormal   Collection Time: 11/21/16 11:39 AM  Result Value Ref Range   Glucose-Capillary 117 (H) 65 - 99 mg/dL  Glucose, capillary     Status: Abnormal   Collection Time: 11/21/16  4:49 PM  Result Value Ref Range   Glucose-Capillary 105 (H) 65 - 99 mg/dL  Glucose, capillary     Status: Abnormal   Collection Time: 11/21/16  8:51 PM  Result Value Ref Range   Glucose-Capillary 160 (H) 65 - 99 mg/dL  Glucose, capillary     Status: Abnormal   Collection Time: 11/22/16  6:42 AM  Result Value Ref Range   Glucose-Capillary 116 (H) 65 - 99 mg/dL   Comment 1 QC Due   Glucose, capillary     Status: Abnormal   Collection Time: 11/22/16 11:57 AM  Result Value Ref Range   Glucose-Capillary 108 (H) 65 - 99 mg/dL  Glucose, capillary     Status: Abnormal   Collection Time: 11/22/16  4:42 PM  Result Value Ref Range   Glucose-Capillary 123 (H) 65 - 99 mg/dL  Glucose, capillary     Status: Abnormal   Collection Time: 11/22/16  9:21 PM  Result Value Ref Range   Glucose-Capillary 135 (H) 65 - 99 mg/dL  Glucose, capillary     Status: Abnormal   Collection Time: 11/23/16  6:32 AM  Result Value Ref Range   Glucose-Capillary 113 (H) 65 - 99 mg/dL  Glucose, capillary     Status: None   Collection Time: 11/23/16 11:23 AM  Result Value Ref Range   Glucose-Capillary 96 65 - 99 mg/dL   Comment 1 Notify RN   Glucose, capillary     Status: Abnormal   Collection Time: 11/23/16  4:42 PM  Result Value Ref Range   Glucose-Capillary 129 (H)  65 - 99 mg/dL   Comment 1 Notify RN   Glucose, capillary     Status: Abnormal   Collection Time: 11/23/16  9:06 PM  Result Value Ref Range   Glucose-Capillary 143 (H) 65 - 99 mg/dL  Basic metabolic panel     Status: Abnormal   Collection Time: 11/24/16  5:37 AM  Result Value Ref Range   Sodium 140 135 - 145 mmol/L   Potassium 3.8 3.5 - 5.1 mmol/L   Chloride 107 101 - 111 mmol/L   CO2 23 22 - 32 mmol/L   Glucose, Bld 114 (H) 65 - 99 mg/dL   BUN 14 6 - 20 mg/dL   Creatinine, Ser 0.58 (L) 0.61 - 1.24 mg/dL   Calcium 8.9 8.9 - 10.3 mg/dL   GFR calc non Af Amer >60 >60 mL/min   GFR calc Af Amer >60 >60 mL/min    Comment: (NOTE) The eGFR has been calculated using the CKD EPI equation. This calculation has not been validated in all clinical situations. eGFR's persistently <60 mL/min signify possible Chronic Kidney Disease.    Anion gap 10 5 - 15  CBC     Status: Abnormal   Collection Time: 11/24/16  5:37  AM  Result Value Ref Range   WBC 5.9 4.0 - 10.5 K/uL   RBC 3.87 (L) 4.22 - 5.81 MIL/uL   Hemoglobin 11.6 (L) 13.0 - 17.0 g/dL   HCT 35.3 (L) 39.0 - 52.0 %   MCV 91.2 78.0 - 100.0 fL   MCH 30.0 26.0 - 34.0 pg   MCHC 32.9 30.0 - 36.0 g/dL   RDW 13.0 11.5 - 15.5 %   Platelets 257 150 - 400 K/uL  Glucose, capillary     Status: Abnormal   Collection Time: 11/24/16  6:34 AM  Result Value Ref Range   Glucose-Capillary 114 (H) 65 - 99 mg/dL     HEENT: normocephalic, atraumatic Cardio: RRR and No JVD Resp: CTA B/L and unlabored GI: BS positive and ND Skin:   Wound C/D/I and Right AKA staples intact - no drainage Neuro:  Motor 3/5 delt, 4/5 Right bi, tri grip 5/5 LUE and LLE Dysarthric  Musc/Skel:  Left AKA Gen NAD,  Well-developed.   Assessment/Plan: 1. 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 hemiparesis and aphasia Stable for D/C today F/u PCP in 3-4 weeks F/u Dr Oneida Alar 1 wk staple removal F/u PM&R 2 weeks See D/C summary See D/C  instructionsFIM: Function - Bathing Bathing activity did not occur: Refused Position: Shower Body parts bathed by patient: Right arm, Left arm, Chest, Abdomen, Front perineal area, Buttocks, Left upper leg, Left lower leg Body parts bathed by helper: Back Bathing not applicable: Right lower leg, Left lower leg Assist Level: Touching or steadying assistance(Pt > 75%), Set up, Supervision or verbal cues (safety cues to not stand) Set up : To obtain items  Function- Upper Body Dressing/Undressing What is the patient wearing?: Hospital gown, Pull over shirt/dress Pull over shirt/dress - Perfomed by patient: Thread/unthread right sleeve, Thread/unthread left sleeve, Put head through opening, Pull shirt over trunk Assist Level: Set up Set up : To obtain clothing/put away Function - Lower Body Dressing/Undressing What is the patient wearing?: Non-skid slipper socks Position: Wheelchair/chair at sink Underwear - Performed by helper: Thread/unthread right underwear leg, Thread/unthread left underwear leg, Pull underwear up/down Pants- Performed by patient: Thread/unthread right pants leg, Pull pants up/down, Thread/unthread left pants leg Pants- Performed by helper: Thread/unthread left pants leg Non-skid slipper socks- Performed by patient: Don/doff right sock Assist for footwear: Setup Assist for lower body dressing: Supervision or verbal cues  Function - Toileting Toileting steps completed by patient: Adjust clothing prior to toileting, Performs perineal hygiene, Adjust clothing after toileting Toileting Assistive Devices: Grab bar or rail Assist level: Supervision or verbal cues  Function - Air cabin crew transfer assistive device: Walker, Elevated toilet seat/BSC over toilet Assist level to toilet: Supervision or verbal cues Assist level from toilet: Supervision or verbal cues  Function - Chair/bed transfer Chair/bed transfer method: Squat pivot Chair/bed transfer assist  level: No Help, no cues, assistive device, takes more than a reasonable amount of time Chair/bed transfer assistive device: Armrests, Bedrails Chair/bed transfer details: Tactile cues for posture, Tactile cues for placement  Function - Locomotion: Wheelchair Will patient use wheelchair at discharge?: Yes Type: Manual Max wheelchair distance: 150 ft Assist Level: No help, No cues, assistive device, takes more than reasonable amount of time Assist Level: No help, No cues, assistive device, takes more than reasonable amount of time Assist Level: No help, No cues, assistive device, takes more than reasonable amount of time Turns around,maneuvers to table,bed, and toilet,negotiates 3% grade,maneuvers on rugs and over doorsills:  Yes Function - Locomotion: Ambulation Assistive device: Walker-rolling Max distance: 15 Assist level: Supervision or verbal cues Walk 10 feet activity did not occur: Safety/medical concerns Assist level: Supervision or verbal cues Walk 50 feet with 2 turns activity did not occur: Safety/medical concerns Walk 150 feet activity did not occur: Safety/medical concerns Walk 10 feet on uneven surfaces activity did not occur: Safety/medical concerns  Function - Comprehension Comprehension: Auditory Comprehension assist level: Follows basic conversation/direction with extra time/assistive device  Function - Expression Expression: Verbal Expression assist level: Expresses basic 50 - 74% of the time/requires cueing 25 - 49% of the time. Needs to repeat parts of sentences.  Function - Social Interaction Social Interaction assist level: Interacts appropriately 50 - 74% of the time - May be physically or verbally inappropriate.  Function - Problem Solving Problem solving assist level: Solves basic 50 - 74% of the time/requires cueing 25 - 49% of the time  Function - Memory Memory assist level: Recognizes or recalls 50 - 74% of the time/requires cueing 25 - 49% of the  time Patient normally able to recall (first 3 days only): Current season, Location of own room, Staff names and faces, That he or she is in a hospital  Medical Problem List and Plan: 1.  Gait abnormality, limitations with transfers secondary to right AKA.  Cont CIR PT, OT- plan for d/c   2.  DVT Prophylaxis/Anticoagulation: Pharmaceutical: Lovenox 3. Pain Management: hydrocodone effective. Increased  18mTID gabapentin for phantom pain 4. Mood: LCSW to follow for evaluation and support.  Neuropsych eval appreciated 5. Neuropsych: This patient is  capable of making decisions on his own behalf. 6. Skin/Wound Care: monitor wound daily for healing.surgical wound healing well no drainage but bandage changed this am 7. Fluids/Electrolytes/Nutrition: Monitor I/Os 8. New diagnosis DM:  Hgb A1c- 6.6. Educate patient on CM diet. Monitor BS ac/hs and use SSI for elevated BS and to promote wound healing.    CBG (last 3) -controlled 4/9  Recent Labs  11/23/16 1642 11/23/16 2106 11/24/16 0634  GLUCAP 129* 143* 114*     9. ABLA: added iron supplement.  10.  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  Insomnia, not due to bowel or bladder issues, also doesn't appear to be pain related although s there is occ shooting pain to "knee" on RIght , change trazodone to nortriptyline LOS (Days) 7 A FACE TO FACE EVALUATION WAS PERFORMED  KIRSTEINS,ANDREW E 11/24/2016, 7:54 AM

## 2016-11-24 NOTE — Progress Notes (Signed)
Social Work Patient ID: Ronald Forbes, male   DOB: 1961-02-25, 56 y.o.   MRN: [id number] SPoke with brother who has also spoke whit sister neither of them can come and transport pt home today. Brother thought he would be here tow weeks, met his goals and doesn't want to be here. MD reports medically stable for discharge today. Asked Caitlyn-PT if pt can transfer safely from taxi to wheelchair-she reported yes. Contacted [employer] regarding taking equipment home and place wheelchair for pt's transfer. Dispatcher voiced they would ot this. Will make arrangements for pt to go home via taxi, once Pam-PA has gone over discharge instructions with pt. Maryann-RN aware of this.

## 2016-11-24 NOTE — Progress Notes (Signed)
Social Work  Discharge Note  The overall goal for the admission was met for:   Discharge location: Yes-HOME MOD/I WHEELCHAIR LEVEL-SISTER COMING Friday TO MOVE IN WITH  Length of Stay: Yes-7 DAYS  Discharge activity level: Yes-MOD/I [place]  Home/community participation: Yes  Services provided included: MD, RD, PT, OT, RN, CM, TR, Pharmacy, Neuropsych and SW  Financial Services: Medicaid  Follow-up services arranged: Home Health: [facility], DME: [facility] and Patient/Family has no preference for HH/DME agencies  Comments (or additional information):PT [place]. SISTER IS PLANNING ON MOVING IN ON Friday AND WILL BE THERE TO ASSIST HIM. PT VERY STUBBORN AND WANTS TO DO TASKS HIS WAY EVEN IF NOT THE SAFEST. HIGH RISK TO FALL AT [facility]. ONLY ELIGIBLE FOR HHRN DUE TO MEDICAID GUIDELINES.   Patient/Family verbalized understanding of follow-up arrangements: Yes  Individual responsible for coordination of the follow-up plan: SELF  Confirmed correct DME delivered: [name] 11/24/2016    [name]

## 2016-11-24 NOTE — Progress Notes (Signed)
Social Work Patient ID: Ronald Forbes, male   DOB: [birth year], 56 y.o.   MRN: [id number]  Awaiting brother or sister to get back with this worker regarding transportation home from the hospital. Pt wants to go home now and doesn't want to wait. Need to make sure can get equipment home with him.

## 2016-11-24 NOTE — Discharge Instructions (Signed)
Inpatient Rehab Discharge Instructions  Ronald Forbes Discharge date and time:  11/24/16  Activities/Precautions/ Functional Status: Activity: no lifting, driving, or strenuous exercise for till cleared by MD. Diet: diabetic diet Wound Care: Wash with soap and water. Keep wound clean and dry. Contact MD if you develop any problems with your incision/wound--redness, swelling, increase in pain, drainage or if you develop fever or chills.   Functional status:  ___ No restrictions     ___ Walk up steps independently _X__ 24/7 supervision/assistance   ___ Walk up steps with assistance ___ Intermittent supervision/assistance  ___ Bathe/dress independently ___ Walk with walker     _X__ Bathe/dress with supervision ___ Walk Independently    ___ Shower independently ___ Walk with assistance    ___ Shower with assistance _X__ No alcohol     ___ Return to work/school ________   Special Instructions:   COMMUNITY REFERRALS UPON DISCHARGE:    Home Health:   RN    Agency:[facility] Phone:[phone]   Date of last service:11/24/2016  Medical Equipment/Items Ordered:WHEELCHAIR & Levan Hurst  Agency/Supplier:[facility]   [phone]   GENERAL COMMUNITY RESOURCES FOR PATIENT/FAMILY: Support Groups:[employer]   Opioid Overdose Opioids are substances that relieve pain by binding to pain receptors in your brain and spinal cord. Opioids include illegal drugs, such as heroin, as well as prescription pain medicines.An opioid overdose happens when you take too much of an opioid substance. This can happen with any type of opioid, including:  Heroin.  Morphine.  Codeine.  Methadone.  Oxycodone.  Hydrocodone.  Fentanyl.  Hydromorphone.  Buprenorphine. The effects of an overdose can be mild, dangerous, or even deadly. Opioid overdose is a medical emergency. What are the causes? This condition may be caused by:  Taking too much of an opioid by  accident.  Taking too much of an opioid on purpose.  An error made by a health care provider who prescribes a medicine.  An error made by the pharmacist who fills the prescription order.  Using more than one substance that contains opioids at the same time.  Mixing an opioid with a substance that affects your heart, breathing, or blood pressure. These include alcohol, tranquilizers, sleeping pills, illegal drugs, and some over-the-counter medicines. What increases the risk? This condition is more likely in:  Children. They may be attracted to colorful pills. Because of a child's small size, even a small amount of a drug can be dangerous.  Elderly people. They may be taking many different drugs. Elderly people may have difficulty reading labels or remembering when they last took their medicine.  People who take an opioid on a long-term basis.  People who use:  Illegal drugs.  Other substances, including alcohol, while using an opioid.  People who have:  A history of drug or alcohol abuse.  Certain mental health conditions.  People who take opioids that are not prescribed for them. What are the signs or symptoms? Symptoms of this condition depend on the type of opioid and the amount that was taken. Common symptoms include:  Sleepiness or difficulty waking from sleep.  Confusion.  Slurred speech.  Slowed breathing and a slow pulse.  Nausea and vomiting.  Abnormally small pupils. Signs and symptoms that require emergency treatment include:  Cold, clammy, and pale skin.  Blue lips and fingernails.  Vomiting.  Gurgling sounds in the throat.  A pulse that is very slow or difficult to detect.  Breathing that is very slow, noisy, or difficult to  detect.  Limp body.  Inability to respond to speech or be awakened from sleep (stupor). How is this diagnosed? This condition is diagnosed based on your symptoms. It is important to tell your health care  provider:  All of the opioidsthat you took.  When you took the opioids.  Whether you were drinking alcohol or using other substances. Your health care provider will do a physical exam. This exam may include:  Checking and monitoring your heart rate and rhythm, your breathing rate and depth, your temperature, and your blood pressure (vital signs).  Checking for abnormally small pupils.  Measuring oxygen levels in your blood. You may also have blood tests or urine tests. How is this treated? Supporting your vital signs and your breathing is the first step in treating an opioid overdose. Treatment may also include:  Giving fluids and minerals (electrolytes) through an IV tube.  Inserting a breathing tube (endotracheal tube) in your airway to help you breathe.  Giving oxygen.  Passing a tube through your nose and into your stomach (NG tube, or nasogastric tube) to wash out your stomach.  Giving medicines that:  Increase your blood pressure.  Absorb any opioid that is in your digestive system.  Reverse the effects of the opioid (naloxone).  Ongoing counseling and mental health support if you intentionally overdosed or used an illegal drug. Follow these instructions at home:  Take over-the-counter and prescription medicines only as told by your health care provider. Always ask your health care provider about possible side effects and interactions of any new medicine that you start taking.  Keep a list of all of the medicines that you take, including over-the-counter medicines. Bring this list with you to all of your medical visits.  Drink enough fluid to keep your urine clear or pale yellow.  Keep all follow-up visits as told by your health care provider. This is important. How is this prevented?  Get help if you are struggling with:  Alcohol or drug use.  Depression or another mental health problem.  Keep the phone number of your local poison control center near your  phone or on your cell phone.  Store all medicines in safety containers that are out of the reach of children.  Read the drug inserts that come with your medicines.  Do not drink alcohol when taking opioids.  Do not use illegal drugs.  Do not take opioid medicines that are not prescribed for you. Contact a health care provider if:  Your symptoms return.  You develop new symptoms or side effects when you are taking medicines. Get help right away if:  You think that you or someone else may have taken too much of an opioid. The hotline of the [facility] is [phone].  You or someone else is having symptoms of an opioid overdose.  You have serious thoughts about hurting yourself or others.  You have:  Chest pain.  Difficulty breathing.  A loss of consciousness. Opioid overdose is an emergency. Do not wait to see if the symptoms will go away. Get medical help right away. Call your local emergency services (911 in the [place] Do not drive yourself to the hospital. This information is not intended to replace advice given to you by your health care provider. Make sure you discuss any questions you have with your health care provider. Document Released: 09/09/2004 Document Revised: 01/08/2016 Document Reviewed: 01/16/2015 Elsevier Interactive Patient Education  2017 [employer].   My questions have been  answered and I understand these instructions. I will adhere to these goals and the provided educational materials after my discharge from the hospital.  Patient/Caregiver Signature _______________________________ Date __________  Clinician Signature _______________________________________ Date __________  Please bring this form and your medication list with you to all your follow-up doctor's appointments.

## 2016-11-24 NOTE — Progress Notes (Signed)
Pt educated re: figure -8 wrap last several days. Has observed procedure but refuses any hands on.  Reviewed s/s infection.

## 2016-11-25 ENCOUNTER — Telehealth: Payer: Self-pay

## 2016-11-25 NOTE — Telephone Encounter (Signed)
Called for Transitional Care call  P, no answer on phone, left message to call back patient name: Ronald Forbes) DOB: ([birth year])  Appointment date/time(12-07-16 / 130pm )arrive time(100pm)and who it is with here(Dr. Wynn Banker.

## 2016-11-26 ENCOUNTER — Other Ambulatory Visit: Payer: Self-pay

## 2016-11-26 MED ORDER — POLYSACCHARIDE IRON COMPLEX 150 MG PO CAPS: 150.0000 mg | ORAL_CAPSULE | Freq: Two times a day (BID) | ORAL | 0 refills | Status: DC

## 2016-12-07 ENCOUNTER — Ambulatory Visit (HOSPITAL_BASED_OUTPATIENT_CLINIC_OR_DEPARTMENT_OTHER): Payer: Medicaid Other | Admitting: Physical Medicine & Rehabilitation

## 2016-12-07 ENCOUNTER — Encounter: Payer: Medicaid Other | Attending: Physical Medicine & Rehabilitation

## 2016-12-07 ENCOUNTER — Encounter: Payer: Self-pay | Admitting: Physical Medicine & Rehabilitation

## 2016-12-07 VITALS — BP 133/87 | HR 80

## 2016-12-07 DIAGNOSIS — S78111A Complete traumatic amputation at level between right hip and knee, initial encounter: Secondary | ICD-10-CM

## 2016-12-07 DIAGNOSIS — Z89611 Acquired absence of right leg above knee: Secondary | ICD-10-CM | POA: Insufficient documentation

## 2016-12-07 DIAGNOSIS — I69351 Hemiplegia and hemiparesis following cerebral infarction affecting right dominant side: Secondary | ICD-10-CM | POA: Diagnosis not present

## 2016-12-07 MED ORDER — HYDROCODONE-ACETAMINOPHEN 5-325 MG PO TABS: 1.0000 | ORAL_TABLET | Freq: Three times a day (TID) | ORAL | 0 refills | Status: DC | PRN

## 2016-12-07 NOTE — Progress Notes (Signed)
Subjective:  Transitional care visit, please see phone call documented on 11/25/2016  Patient ID: Ronald Forbes, male    DOB: [birth year], 56 y.o.   MRN: [id number] Admit date: 11/17/2016 Discharge date: 11/24/2016 56 y.o. male with history of CVA with residual right sided weakness, morbid obesity, ongoing tobacco and alcohol abuse, PVD s/p femoral to anterior tibial bypass graft 10/25/16 who was readmitted on 11/10/16 with right foot pain and swelling. He was found to have occluded right leg bypass and was admitted for pain control and amputation recommended. He underwent R-AKA on 4/2 by Dr. Darrick Penna and therapy initiated Admit date: 11/17/2016 Discharge date: 11/24/2016  HPI Did not go to PCP follow up, appt was 4/20 discused with pt and sister HHRN came out but not PT Transfers Mod I to shower, WC to bed and toilet Mod I dressing and bathing 1 fall last week didn't lock WC Pt smoking 1/2 ppd- discussed negative effects on blood vessels as well as stroke risk  Pain Inventory Average Pain 5 Pain Right Now 3 My pain is aching  In the last 24 hours, has pain interfered with the following? General activity 3 Relation with others 3 Enjoyment of life 5 What TIME of day is your pain at its worst? night Sleep (in general) Fair  Pain is worse with: sitting and standing Pain improves with: medication Relief from Meds: 6  Mobility ability to climb steps?  no do you drive?  no  Function not employed: date last employed 2014 I need assistance with the following:  meal prep  Neuro/Psych No problems in this area  Prior Studies Any changes since last visit?  no  Physicians involved in your care Any changes since last visit?  no   Family History  Problem Relation Age of Onset  . Hypertension Mother    Social History   Social History  . Marital status: Single    Spouse name: N/A  . Number of children: N/A  . Years of education: N/A   Occupational History  . Curator    Social  History Main Topics  . Smoking status: Former Smoker    Packs/day: 1.00    Years: 40.00    Types: Cigarettes    Quit date: 10/13/2016  . Smokeless tobacco: Never Used  . Alcohol use 0.0 oz/week     Comment: 6 pack/day  . Drug use: Yes    Types: Marijuana     Comment: occasional marijuana  . Sexual activity: Not Asked   Other Topics Concern  . None   Social History Narrative   Lives alone   Used to work as a Curator   Past Surgical History:  Procedure Laterality Date  . ABDOMINAL AORTOGRAM W/LOWER EXTREMITY Right 10/20/2016   Procedure: Abdominal Aortogram w/Lower Extremity;  Surgeon: Maeola Harman, MD;  Location: [facility] INVASIVE CV LAB;  Service: Cardiovascular;  Laterality: Right;  lower leg  . AMPUTATION Right 11/15/2016   Procedure: AMPUTATION ABOVE KNEE;  Surgeon: Sherren Kerns, MD;  Location: [facility] OR;  Service: Vascular;  Laterality: Right;  . EP IMPLANTABLE DEVICE N/A 02/09/2016   Procedure: Loop Recorder Insertion;  Surgeon: Duke Salvia, MD;  Location: [facility] INVASIVE CV LAB;  Service: Cardiovascular;  Laterality: N/A;  . FEMORAL-TIBIAL BYPASS GRAFT Right 10/25/2016   Procedure: BYPASS GRAFT FEMORAL-TIBIAL ARTERY USING NON REVERSED SAPPHENOUS VEIN;  Surgeon: Sherren Kerns, MD;  Location: [facility] OR;  Service: Vascular;  Laterality: Right;  . INTRAOPERATIVE ARTERIOGRAM Right 10/25/2016   Procedure:  INTRA OPERATIVE ARTERIOGRAM;  Surgeon: Sherren Kerns, MD;  Location: [facility] OR;  Service: Vascular;  Laterality: Right;  . TEE WITHOUT CARDIOVERSION N/A 02/06/2016   Procedure: TRANSESOPHAGEAL ECHOCARDIOGRAM (TEE);  Surgeon: Wendall Stade, MD;  Location[facility] ENDOSCOPY;  Service: Cardiovascular;  Laterality: N/A;   Past Medical History:  Diagnosis Date  . Alcohol use disorder (HCC)   . Cerebral vascular accident (HCC)   . 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 Diabetes mellitus type 2 in obese (HCC)   . HLD (hyperlipidemia)     . Morbid obesity (HCC)   . Stroke (HCC)   . Tobacco abuse    BP 133/87   Pulse 80   SpO2 96%   Opioid Risk Score:   Fall Risk Score:  `1  Depression screen PHQ 2/9  No flowsheet data found.   Review of Systems  Constitutional: Negative.   HENT: Negative.   Eyes: Negative.   Respiratory: Negative.   Cardiovascular: Negative.   Gastrointestinal: Negative.   Endocrine: Negative.   Genitourinary: Negative.   Musculoskeletal: Negative.   Skin: Negative.   Allergic/Immunologic: Negative.   Neurological: Negative.   Hematological: Negative.   Psychiatric/Behavioral: Negative.   All other systems reviewed and are negative.      Objective:   Physical Exam  Constitutional: He is oriented to person, place, and time. He appears well-developed and well-nourished.  HENT:  Head: Normocephalic and atraumatic.  Eyes: Conjunctivae and EOM are normal. Pupils are equal, round, and reactive to light.  Neck: Normal range of motion.  Cardiovascular: Normal rate and regular rhythm.   Pulmonary/Chest: Effort normal and breath sounds normal. No respiratory distress. He has no wheezes.  Abdominal: Soft. Bowel sounds are normal. He exhibits no distension. There is no tenderness.  Musculoskeletal:  Right AKA stump with staples intact. No erythema or drainage. There is distal stump edema  Neurological: He is alert and oriented to person, place, and time. Gait abnormal.  Gait not tested has not been ambulating with walker  Motor strength is 4/5 in the right deltoid, bicep, tricep, grip. There is decreased coordination in the right hand. Left upper extremity. Left lower extremity 5/5 Right hip flexors 3 minus  Psychiatric: Thought content normal. His affect is blunt. His speech is delayed. He is slowed.  Nursing note and vitals reviewed.         Assessment & Plan:   Status post right AKA for 11/15/2016  1.  Has  f/u with Dr Darrick Penna For staple removal next week Refilled hydrocodone  5/325 one to 2 tablets every 8 hours as needed. 60 tablets given. I discussed with the patient and his sister that this would be the last narcotic analgesic Rx from this office since we will not be following him on a chronic basis.  Dr. Darrick Penna will assess when he is ready to go for prosthetic fitting. Because he had CVA with residual right hemiparesis prosthetic training would present an additional challenge and it is questionable if he could become a functional ambulator.  2. Hyper lipidemia, peripheral vascular disease, history of diabetes, but patient's CBGs were normal in the hospital without diabetic medications, follow-[facility], contact information printed for patient's sister

## 2016-12-07 NOTE — Patient Instructions (Addendum)
[facility] Follow up on 12/03/2016.   Specialty:  Internal Medicine Why:  APPOINTMENT @ 10:45 NEEDS TO BE THERE @ 10:30 AM Contact information: [address] [place] [address] [phone] PLEASE CALL TO RESCHEDULE  Please wrap Right thigh with ACE wrap

## 2016-12-08 ENCOUNTER — Encounter: Payer: Self-pay | Admitting: Vascular Surgery

## 2016-12-08 ENCOUNTER — Inpatient Hospital Stay: Payer: Medicaid Other | Admitting: Physical Medicine & Rehabilitation

## 2016-12-16 ENCOUNTER — Ambulatory Visit (HOSPITAL_COMMUNITY)
Admission: RE | Admit: 2016-12-16 | Discharge: 2016-12-16 | Disposition: A | Discharge status: Home / Self Care | Payer: Medicaid Other | Source: Ambulatory Visit | Attending: Vascular Surgery | Admitting: Vascular Surgery

## 2016-12-16 ENCOUNTER — Encounter: Payer: Self-pay | Admitting: Vascular Surgery

## 2016-12-16 ENCOUNTER — Ambulatory Visit (INDEPENDENT_AMBULATORY_CARE_PROVIDER_SITE_OTHER)
Admission: RE | Admit: 2016-12-16 | Discharge: 2016-12-16 | Disposition: A | Discharge status: Home / Self Care | Payer: Medicaid Other | Source: Ambulatory Visit | Attending: Vascular Surgery | Admitting: Vascular Surgery

## 2016-12-16 ENCOUNTER — Ambulatory Visit (INDEPENDENT_AMBULATORY_CARE_PROVIDER_SITE_OTHER): Payer: Self-pay | Admitting: Vascular Surgery

## 2016-12-16 ENCOUNTER — Other Ambulatory Visit: Payer: Self-pay

## 2016-12-16 VITALS — BP 127/80 | HR 77 | Temp 97.3°F | Resp 18 | Ht 75.0 in | Wt 230.0 lb

## 2016-12-16 DIAGNOSIS — M7989 Other specified soft tissue disorders: Secondary | ICD-10-CM

## 2016-12-16 DIAGNOSIS — I824Y1 Acute embolism and thrombosis of unspecified deep veins of right proximal lower extremity: Secondary | ICD-10-CM

## 2016-12-16 DIAGNOSIS — I82421 Acute embolism and thrombosis of right iliac vein: Secondary | ICD-10-CM | POA: Insufficient documentation

## 2016-12-16 DIAGNOSIS — I739 Peripheral vascular disease, unspecified: Secondary | ICD-10-CM

## 2016-12-16 DIAGNOSIS — I82411 Acute embolism and thrombosis of right femoral vein: Secondary | ICD-10-CM

## 2016-12-16 MED ORDER — RIVAROXABAN (XARELTO) VTE STARTER PACK (15 & 20 MG): ORAL_TABLET | ORAL | 0 refills | Status: DC

## 2016-12-16 NOTE — Progress Notes (Signed)
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  His staples have been removed. He did fall on the stump recently but did not really have any skin break. He has noticed persistent swelling in the above-knee amputation. He denies any shortness of breath or chest pain.  Vitals:   12/16/16 0906  BP: 127/80  Pulse: 77  Resp: 18  Temp: 97.3 F (36.3 C)  SpO2: 98%  Weight: 230 lb (104.3 kg)  Height: 6\' 3"  (1.905 m)    Extremities: Well-healed right above-knee amputation edematous approximate 20% larger than the left leg.  Data: Patient had a venous duplex exam of his right leg today which showed a right common femoral and external iliac vein thrombosis  Assessment: Healing right above-knee amputation with acute DVT right leg  Plan: The patient was started on Xarelto today for anticoagulation. He was told to stop his aspirin temporarily while he is on Xarelto. Course of treatment will most likely be 6 months. I will see him back in 1 month for a follow-up visit to see if his leg swelling has decreased enough for consideration for shrinker as I did not was start this today.  Fabienne Brunsharles Ajani Rineer, MD [facility] Office: [phone] Pager: [phone]

## 2016-12-20 ENCOUNTER — Telehealth: Payer: Self-pay | Admitting: Vascular Surgery

## 2016-12-20 NOTE — Telephone Encounter (Signed)
We received PA request from pharmacy for Xarelto.  Called plan # at [phone].  Rep ([place] says the medicine does not require a PA but there is a problem with pt renewing Medicaid insurance.  I tried to contact pt but his phone kept disconnecting.  I called secondary phone nbr and left voice msg for him to call us back ASAP.

## 2016-12-21 ENCOUNTER — Telehealth: Payer: Self-pay | Admitting: Vascular Surgery

## 2016-12-21 ENCOUNTER — Encounter (HOSPITAL_COMMUNITY): Payer: Self-pay

## 2016-12-21 ENCOUNTER — Emergency Department (HOSPITAL_COMMUNITY)
Admission: EM | Admit: 2016-12-21 | Discharge: 2016-12-21 | Disposition: A | Discharge status: Home / Self Care | Payer: Medicaid Other | Attending: Emergency Medicine | Admitting: Emergency Medicine

## 2016-12-21 DIAGNOSIS — E1140 Type 2 diabetes mellitus with diabetic neuropathy, unspecified: Secondary | ICD-10-CM | POA: Diagnosis not present

## 2016-12-21 DIAGNOSIS — Z8673 Personal history of transient ischemic attack (TIA), and cerebral infarction without residual deficits: Secondary | ICD-10-CM | POA: Insufficient documentation

## 2016-12-21 DIAGNOSIS — I82411 Acute embolism and thrombosis of right femoral vein: Secondary | ICD-10-CM

## 2016-12-21 DIAGNOSIS — Z7901 Long term (current) use of anticoagulants: Secondary | ICD-10-CM | POA: Diagnosis not present

## 2016-12-21 DIAGNOSIS — E1151 Type 2 diabetes mellitus with diabetic peripheral angiopathy without gangrene: Secondary | ICD-10-CM | POA: Diagnosis not present

## 2016-12-21 DIAGNOSIS — I82412 Acute embolism and thrombosis of left femoral vein: Secondary | ICD-10-CM | POA: Insufficient documentation

## 2016-12-21 DIAGNOSIS — Z79899 Other long term (current) drug therapy: Secondary | ICD-10-CM | POA: Insufficient documentation

## 2016-12-21 DIAGNOSIS — Z87891 Personal history of nicotine dependence: Secondary | ICD-10-CM | POA: Diagnosis not present

## 2016-12-21 DIAGNOSIS — M7989 Other specified soft tissue disorders: Secondary | ICD-10-CM | POA: Diagnosis present

## 2016-12-21 LAB — CBC WITH DIFFERENTIAL/PLATELET
BASOS PCT: 0 %
Basophils Absolute: 0 10*3/uL (ref 0.0–0.1)
EOS ABS: 0.1 10*3/uL (ref 0.0–0.7)
EOS PCT: 2 %
HCT: 35.2 % — ABNORMAL LOW (ref 39.0–52.0)
HEMOGLOBIN: 11.6 g/dL — AB (ref 13.0–17.0)
Lymphocytes Relative: 29 %
Lymphs Abs: 2.1 10*3/uL (ref 0.7–4.0)
MCH: 29.9 pg (ref 26.0–34.0)
MCHC: 33 g/dL (ref 30.0–36.0)
MCV: 90.7 fL (ref 78.0–100.0)
MONO ABS: 0.4 10*3/uL (ref 0.1–1.0)
Monocytes Relative: 5 %
NEUTROS PCT: 64 %
Neutro Abs: 4.8 10*3/uL (ref 1.7–7.7)
PLATELETS: 203 10*3/uL (ref 150–400)
RBC: 3.88 MIL/uL — ABNORMAL LOW (ref 4.22–5.81)
RDW: 13.7 % (ref 11.5–15.5)
WBC: 7.4 10*3/uL (ref 4.0–10.5)

## 2016-12-21 LAB — BASIC METABOLIC PANEL
Anion gap: 9 (ref 5–15)
BUN: 7 mg/dL (ref 6–20)
CALCIUM: 8.8 mg/dL — AB (ref 8.9–10.3)
CO2: 26 mmol/L (ref 22–32)
CREATININE: 0.68 mg/dL (ref 0.61–1.24)
Chloride: 106 mmol/L (ref 101–111)
Glucose, Bld: 145 mg/dL — ABNORMAL HIGH (ref 65–99)
Potassium: 3.5 mmol/L (ref 3.5–5.1)
SODIUM: 141 mmol/L (ref 135–145)

## 2016-12-21 LAB — PROTIME-INR
INR: 0.95
PROTHROMBIN TIME: 12.7 s (ref 11.4–15.2)

## 2016-12-21 MED ORDER — RIVAROXABAN (XARELTO) VTE STARTER PACK (15 & 20 MG)
ORAL_TABLET | ORAL | 0 refills | Status: DC
Start: 2016-12-21 — End: 2017-01-26

## 2016-12-21 MED ORDER — ENOXAPARIN SODIUM 100 MG/ML ~~LOC~~ SOLN
100.0000 mg | Freq: Once | SUBCUTANEOUS | Status: AC
  Administered 2016-12-21: 100 mg via SUBCUTANEOUS
  Filled 2016-12-21: qty 1

## 2016-12-21 MED ORDER — IOPAMIDOL (ISOVUE-370) INJECTION 76%
INTRAVENOUS | Status: AC
  Filled 2016-12-21: qty 100

## 2016-12-21 NOTE — Telephone Encounter (Addendum)
Patient's daughter called back and said patient cannot afford Xarelto at this time.  He let his Medicaid lapse and is working on getting it back.  He will take a baby aspirin in the meantime.  I told her I would advise the Md.  [phone]  Per Dr. Darrick PennaFields, "He needs coumadin if he cant afford xarelto. He will need f/u with INR by his PCP or if he does not have a PCP us. He is at risk for PE and death if he does not take anticoagulation. If he cannot start either one of these today he needs to be admitted to the hospital for IV heparin."    Patient's sister is aware.  She will take patient to the hospital today.

## 2016-12-21 NOTE — ED Notes (Signed)
Pt recently discharged from hospital where he had amputation of R leg. Pt had xray on Thursday and per family pt dx with blood clot. Pt denies sob/cp.

## 2016-12-21 NOTE — Care Management (Signed)
ED CM received consulted concerning assistance with anticoagulants for DVT. Patient was seen at Dr. Oneida [facility] office today and was sent to ED after patient admits to not being able to afford Louanna Raw he stated his Medicaid is no longer active. ED CM met with patient to discuss the 30 day trial offer for Xarelto, and establishing care with a PCP. Patient is agreeable with care transition plan. Patient given Xarelto card with instruction on how to redeem. Also discussed the [facility] and services rendered patient is agreeable with establishing care at the clinic, referral sent to scheduler. Patient instructed to also contact the clinic tomorrow after 9a to confirm appointment. Patient verbalized understanding teach back done. Updated Dr. Hillard Danker and Otila Kluver RN on [place] [address]. No further ED CM needs identified.

## 2016-12-21 NOTE — ED Triage Notes (Addendum)
Pt had recent amputation and was called by his vascular specialist and told he had a blood clot. Pt was given xarelto but  Cannot afford it. Sent here for further management. No SOB per patient, resp e/u

## 2016-12-21 NOTE — Progress Notes (Signed)
ANTICOAGULATION CONSULT NOTE - Initial Consult  Pharmacy Consult for Lovenox Indication: DVT  No Known Allergies  Patient Measurements:    Vital Signs: Temp: 99.5 F (37.5 C) (05/08 1609) Temp Source: Oral (05/08 1609) BP: 124/73 (05/08 1609) Pulse Rate: 99 (05/08 1609)  Labs: No results for input(s): HGB, HCT, PLT, APTT, LABPROT, INR, HEPARINUNFRC, HEPRLOWMOCWT, CREATININE, CKTOTAL, CKMB, TROPONINI in the last 72 hours.  CrCl cannot be calculated (Patient's most recent lab result is older than the maximum 21 days allowed.).   Medical History: Past Medical History:  Diagnosis Date  . Alcohol use disorder (HCC)   . Cerebral vascular accident (HCC)   . 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 Diabetes mellitus type 2 in obese (HCC)   . HLD (hyperlipidemia)   . Morbid obesity (HCC)   . Stroke (HCC)   . Tobacco abuse     Medications:   (Not in a hospital admission) Scheduled:  . iopamidol       Infusions:    Assessment: 56yo male with recent diagnosis or DVT in R leg. Patient was unable to obtain xarelto due to cost. Pharmacy is consulted to dose lovenox for DVT.  Goal of Therapy:  Monitor platelets by anticoagulation protocol: Yes   Plan:  Lovenox 1mg /kg subcutaneously q12h Monitor s/sx of bleeding and renal function F/u on oral anticoagulation  Arlean Hoppingorey M. Newman PiesBall, PharmD, BCPS Clinical Pharmacist [phone] 12/21/2016,4:36 PM

## 2016-12-21 NOTE — ED Notes (Signed)
Pt pulled his IV out.

## 2016-12-21 NOTE — ED Provider Notes (Signed)
[facility]-EMERGENCY DEPT Provider Note   CSN: [id number] Arrival date & time: 12/21/16  1309     History   Chief Complaint Chief Complaint  Patient presents with  . DVT    HPI Ronald Forbes is a 56 y.o. male.  HPI Patient presents to the emergency room for evaluation of a DVT. Patient has a history of above-the-knee right knee amputation on April 2 of this year. Patient had noticed persistent swelling in his right leg when he was evaluated by his surgeon on May 3. Patient had a vascular study done on that day that showed a right common femoral and external iliac vein thrombosis. Patient was prescribed Xarelto.  The patient was not able to afford his Xarelto.  Patient was told by his vascular surgeon that if he could not afford to Xarelto he need to be on Coumadin.  Patient was sent to the emergency room because he does not have a primary care doctor to see if we can assist in setting that up. She denies any trouble with fevers or chills. No chest pain or shortness of breath. Past Medical History:  Diagnosis Date  . Alcohol use disorder (HCC)   . Cerebral vascular accident (HCC)   . 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 Diabetes mellitus type 2 in obese (HCC)   . HLD (hyperlipidemia)   . Morbid obesity (HCC)   . Stroke (HCC)   . Tobacco abuse     Patient Active Problem List   Diagnosis Date Noted  . Hemiparesis affecting right side as late effect of cerebrovascular accident (CVA) (HCC) 12/07/2016  . Type 2 diabetes mellitus with diabetic peripheral angiopathy without gangrene, without long-term current use of insulin (HCC)   . 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 History of CVA with residual deficit   . Phantom limb pain (HCC)   . 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 Unilateral AKA, right (HCC)   . Diabetic peripheral neuropathy (HCC)   . Diabetes mellitus type 2  in nonobese (HCC)   . Reactive hypertension   . Type 2 diabetes mellitus with peripheral neuropathy (HCC)   . Benign essential HTN   . Hypokalemia   . Hypoalbuminemia due to protein-calorie malnutrition (HCC)   . Acute blood loss anemia   . Debilitated 10/28/2016  . Ischemia of right lower extremity 10/25/2016  . PAD (peripheral artery disease) (HCC) 10/20/2016  . Thyroid nodule 02/29/2016  . 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 Cerebrovascular accident (CVA) due to embolism of left posterior cerebral artery (HCC)   . Diabetes mellitus type 2 in obese (HCC) 02/07/2016  . HLD (hyperlipidemia)   . Tobacco abuse 02/04/2016  . Alcohol use disorder (HCC) 02/04/2016  . Morbid obesity (HCC) 02/04/2016  . CVA (cerebral vascular accident) (HCC) 02/04/2016    Past Surgical History:  Procedure Laterality Date  . ABDOMINAL AORTOGRAM W/LOWER EXTREMITY Right 10/20/2016   Procedure: Abdominal Aortogram w/Lower Extremity;  Surgeon: Maeola Harman, MD;  [facility] INVASIVE CV LAB;  Service: Cardiovascular;  Laterality: Right;  lower leg  . AMPUTATION Right 11/15/2016   Procedure: AMPUTATION ABOVE KNEE;  Surgeon: Sherren Kerns, MD;  Location: [facility] OR;  Service: Vascular;  Laterality: Right;  . EP IMPLANTABLE DEVICE N/A 02/09/2016   Procedure: Loop Recorder Insertion;  Surgeon: Duke Salvia, MD;  Location: [facility] INVASIVE CV LAB;  Service: Cardiovascular;  Laterality:  N/A;  . FEMORAL-TIBIAL BYPASS GRAFT Right 10/25/2016   Procedure: BYPASS GRAFT FEMORAL-TIBIAL ARTERY USING NON REVERSED SAPPHENOUS VEIN;  Surgeon: Sherren Kernsharles E Fields, MD;  [facility];  Service: Vascular;  Laterality: Right;  . INTRAOPERATIVE ARTERIOGRAM Right 10/25/2016   Procedure: INTRA OPERATIVE ARTERIOGRAM;  Surgeon: Sherren Kernsharles E Fields, MD;  [facility];  Service: Vascular;  Laterality: Right;  . TEE WITHOUT CARDIOVERSION N/A 02/06/2016   Procedure: TRANSESOPHAGEAL ECHOCARDIOGRAM (TEE);  Surgeon: Wendall StadePeter C Nishan, MD;   [facility];  Service: Cardiovascular;  Laterality: N/A;       Home Medications    Prior to Admission medications   Medication Sig Start Date End Date Taking? Authorizing Provider  atorvastatin (LIPITOR) 40 MG tablet Take 1 tablet (40 mg total) by mouth daily at 6 PM. 11/23/16  Yes Love, Evlyn KannerPamela S, PA-C  gabapentin (NEURONTIN) 100 MG capsule Take 1 capsule (100 mg total) by mouth 3 (three) times daily. 11/24/16  Yes Love, Evlyn KannerPamela S, PA-C  methocarbamol (ROBAXIN) 500 MG tablet Take 1 tablet (500 mg total) by mouth every 6 (six) hours as needed for muscle spasms. 11/24/16  Yes Love, Evlyn KannerPamela S, PA-C  nortriptyline (PAMELOR) 25 MG capsule Take 1 capsule (25 mg total) by mouth at bedtime. 11/24/16  Yes Love, Evlyn KannerPamela S, PA-C  pantoprazole (PROTONIX) 40 MG tablet Take 1 tablet (40 mg total) by mouth daily. 11/24/16  Yes Love, Evlyn KannerPamela S, PA-C  senna-docusate (SENOKOT-S) 8.6-50 MG tablet Take 2 tablets by mouth 2 (two) times daily. 11/23/16  Yes Love, Evlyn KannerPamela S, PA-C  traMADol (ULTRAM) 50 MG tablet Take 1 tablet (50 mg total) by mouth every 6 (six) hours as needed for moderate pain. 11/24/16  Yes Love, Evlyn KannerPamela S, PA-C  HYDROcodone-acetaminophen (NORCO/VICODIN) 5-325 MG tablet Take 1-2 tablets by mouth every 8 (eight) hours as needed for moderate pain. See instructions to wean narcotics. Patient not taking: Reported on 12/21/2016 12/07/16   Erick ColaceKirsteins, Andrew E, MD  iron polysaccharides (NIFEREX) 150 MG capsule Take 1 capsule (150 mg total) by mouth 2 (two) times daily. Patient not taking: Reported on 12/16/2016 11/26/16   Love, Evlyn KannerPamela S, PA-C  Rivaroxaban 15 & 20 MG TBPK Take as directed on package: Start with one 15mg  tablet by mouth twice a day with food. On Day 22, switch to one 20mg  tablet once a day with food. Patient not taking: Reported on 12/21/2016 12/16/16   Sherren KernsFields, Charles E, MD    Family History Family History  Problem Relation Age of Onset  . Hypertension Mother     Social History Social  History  Substance Use Topics  . Smoking status: Former Smoker    Packs/day: 1.00    Years: 40.00    Types: Cigarettes    Quit date: 10/13/2016  . Smokeless tobacco: Never Used  . Alcohol use 0.0 oz/week     Comment: 6 pack/day     Allergies   Patient has no known allergies.   Review of Systems Review of Systems  All other systems reviewed and are negative.    Physical Exam Updated Vital Signs BP (!) 141/84   Pulse 73   Temp 99.5 F (37.5 C) (Oral)   Resp 18   SpO2 96%   Physical Exam  Constitutional: He appears well-developed and well-nourished. No distress.  HENT:  Head: Normocephalic and atraumatic.  Right Ear: External ear normal.  Left Ear: External ear normal.  Eyes: Conjunctivae are normal. Right eye exhibits no discharge. Left eye exhibits no discharge. No  scleral icterus.  Neck: Neck supple. No tracheal deviation present.  Cardiovascular: Normal rate, regular rhythm and intact distal pulses.   Pulmonary/Chest: Effort normal and breath sounds normal. No stridor. No respiratory distress. He has no wheezes. He has no rales.  Abdominal: Soft. Bowel sounds are normal. He exhibits no distension. There is no tenderness. There is no rebound and no guarding.  Musculoskeletal: He exhibits edema. He exhibits no tenderness.  Status post right above-the-knee amputation, edema of the right thigh  Neurological: He is alert. He has normal strength. No cranial nerve deficit (no facial droop, extraocular movements intact, no slurred speech) or sensory deficit. He exhibits normal muscle tone. He displays no seizure activity. Coordination normal.  Skin: Skin is warm and dry. No rash noted.  Psychiatric: He has a normal mood and affect.  Nursing note and vitals reviewed.    ED Treatments / Results  Labs (all labs ordered are listed, but only abnormal results are displayed) Labs Reviewed  CBC WITH DIFFERENTIAL/PLATELET - Abnormal; Notable for the following:       Result  Value   RBC 3.88 (*)    Hemoglobin 11.6 (*)    HCT 35.2 (*)    All other components within normal limits  BASIC METABOLIC PANEL - Abnormal; Notable for the following:    Glucose, Bld 145 (*)    Calcium 8.8 (*)    All other components within normal limits  PROTIME-INR    Procedures Procedures (including critical care time)  Medications Ordered in ED Medications  iopamidol (ISOVUE-370) 76 % injection (not administered)  enoxaparin (LOVENOX) injection 100 mg (100 mg Subcutaneous Given 12/21/16 1707)     Initial Impression / Assessment and Plan / ED Course  I have reviewed the triage vital signs and the nursing notes.  Pertinent labs & imaging results that were available during my care of the patient were reviewed by me and considered in my medical decision making (see chart for details).  Clinical Course as of Dec 21 1752  Tue Dec 21, 2016  1649 Consulted with case management to assist with his medications  [JK]    Clinical Course User Index [JK] Linwood Dibbles, MD   Pt was seen by Burna Mortimer, Case management.  Pt was eligible for the 30 day xarelto starter pack.  He was given the coupon in the ED.  Pt was given a dose of lovenox to cover him in the meantime.  He will pick up the prescription and start taking it tomorrow.  Final Clinical Impressions(s) / ED Diagnoses   Final diagnoses:  Acute deep vein thrombosis (DVT) of femoral vein of right lower extremity [facility])    New Prescriptions Roxy Manns, MD 12/21/16 1754

## 2016-12-21 NOTE — Discharge Instructions (Signed)
Start taking the xarelto as prescribed, follow up with the Clinic as listed in your discharge instructions

## 2017-01-04 ENCOUNTER — Inpatient Hospital Stay (INDEPENDENT_AMBULATORY_CARE_PROVIDER_SITE_OTHER): Payer: Self-pay | Admitting: Physician Assistant

## 2017-01-06 ENCOUNTER — Encounter (INDEPENDENT_AMBULATORY_CARE_PROVIDER_SITE_OTHER): Payer: Self-pay | Admitting: Physician Assistant

## 2017-01-06 ENCOUNTER — Ambulatory Visit (INDEPENDENT_AMBULATORY_CARE_PROVIDER_SITE_OTHER): Payer: Medicaid Other | Admitting: Physician Assistant

## 2017-01-06 VITALS — BP 133/82 | HR 69 | Temp 98.0°F | Ht 75.0 in

## 2017-01-06 DIAGNOSIS — S78119A Complete traumatic amputation at level between unspecified hip and knee, initial encounter: Secondary | ICD-10-CM

## 2017-01-06 DIAGNOSIS — Z89619 Acquired absence of unspecified leg above knee: Secondary | ICD-10-CM | POA: Diagnosis not present

## 2017-01-06 DIAGNOSIS — Z131 Encounter for screening for diabetes mellitus: Secondary | ICD-10-CM

## 2017-01-06 DIAGNOSIS — O2230 Deep phlebothrombosis in pregnancy, unspecified trimester: Secondary | ICD-10-CM

## 2017-01-06 DIAGNOSIS — Z4802 Encounter for removal of sutures: Secondary | ICD-10-CM | POA: Diagnosis not present

## 2017-01-06 DIAGNOSIS — I82411 Acute embolism and thrombosis of right femoral vein: Secondary | ICD-10-CM | POA: Diagnosis not present

## 2017-01-06 LAB — POCT GLYCOSYLATED HEMOGLOBIN (HGB A1C): Hemoglobin A1C: 5.6

## 2017-01-06 MED ORDER — RIVAROXABAN 20 MG PO TABS: 20.0000 mg | ORAL_TABLET | Freq: Every day | ORAL | 5 refills | Status: DC

## 2017-01-06 NOTE — Patient Instructions (Signed)
Suture Removal, Care After Refer to this sheet in the next few weeks. These instructions provide you with information on caring for yourself after your procedure. Your health care provider may also give you more specific instructions. Your treatment has been planned according to current medical practices, but problems sometimes occur. Call your health care provider if you have any problems or questions after your procedure. What can I expect after the procedure? After your stitches (sutures) are removed, it is typical to have the following:  Some discomfort and swelling in the wound area.  Slight redness in the area.  Follow these instructions at home:  If you have skin adhesive strips over the wound area, do not take the strips off. They will fall off on their own in a few days. If the strips remain in place after 14 days, you may remove them.  Change any bandages (dressings) at least once a day or as directed by your health care provider. If the bandage sticks, soak it off with warm, soapy water.  Apply cream or ointment only as directed by your health care provider. If using cream or ointment, wash the area with soap and water 2 times a day to remove all the cream or ointment. Rinse off the soap and pat the area dry with a clean towel.  Keep the wound area dry and clean. If the bandage becomes wet or dirty, or if it develops a bad smell, change it as soon as possible.  Continue to protect the wound from injury.  Use sunscreen when out in the sun. New scars become sunburned easily. Contact a health care provider if:  You have increasing redness, swelling, or pain in the wound.  You see pus coming from the wound.  You have a fever.  You notice a bad smell coming from the wound or dressing.  Your wound breaks open (edges not staying together). This information is not intended to replace advice given to you by your health care provider. Make sure you discuss any questions you have  with your health care provider. Document Released: 04/27/2001 Document Revised: 01/08/2016 Document Reviewed: 03/14/2013 Elsevier Interactive Patient Education  2017 [employer]  

## 2017-01-06 NOTE — Progress Notes (Signed)
Subjective:  Patient ID: Ronald Forbes, male    DOB: 1961-06-30  Age: 56 y.o. MRN: [id number]  CC: staple removal  HPI Ronald Forbes is a 56 y.o. male with recent hx of DVT and of right AKA in the setting of DM2, PAD, and CVA presents to have staples removed. Amputation occurred on 11/15/16 after diagnosis of gangrene of right foot. Also found to have DVT in the right femoral vein at ED visit on 12/21/16. Initially had an issue with affordability of Xarelto but is now on a 30 day starter pack. Began the starter pack on 12/22/16. Denies any bleeding episodes/diathesis. Denies CP, palpitations, SOB, HA, abdominal pain, f/c/n/v, rash, or GI/GU sxs.    Main concern today is to have staples on stump removed. Staples have been present for over seven weeks. No sign of infection but there is mild weeping at the medial most staples.     Outpatient Medications Prior to Visit  Medication Sig Dispense Refill  . atorvastatin (LIPITOR) 40 MG tablet Take 1 tablet (40 mg total) by mouth daily at 6 PM. 30 tablet 0  . gabapentin (NEURONTIN) 100 MG capsule Take 1 capsule (100 mg total) by mouth 3 (three) times daily. 90 capsule 0  . HYDROcodone-acetaminophen (NORCO/VICODIN) 5-325 MG tablet Take 1-2 tablets by mouth every 8 (eight) hours as needed for moderate pain. See instructions to wean narcotics. 60 tablet 0  . methocarbamol (ROBAXIN) 500 MG tablet Take 1 tablet (500 mg total) by mouth every 6 (six) hours as needed for muscle spasms. 90 tablet 0  . nortriptyline (PAMELOR) 25 MG capsule Take 1 capsule (25 mg total) by mouth at bedtime. 30 capsule 0  . pantoprazole (PROTONIX) 40 MG tablet Take 1 tablet (40 mg total) by mouth daily. 30 tablet 0  . Rivaroxaban 15 & 20 MG TBPK Take as directed on package: Start with one 15mg  tablet by mouth twice a day with food. On Day 22, switch to one 20mg  tablet once a day with food. 51 each 0  . senna-docusate (SENOKOT-S) 8.6-50 MG tablet Take 2 tablets by mouth 2 (two) times  daily. 100 tablet 0  . traMADol (ULTRAM) 50 MG tablet Take 1 tablet (50 mg total) by mouth every 6 (six) hours as needed for moderate pain. 90 tablet 0  . iron polysaccharides (NIFEREX) 150 MG capsule Take 1 capsule (150 mg total) by mouth 2 (two) times daily. (Patient not taking: Reported on 12/16/2016) 60 capsule 0   No facility-administered medications prior to visit.      ROS Review of Systems  Constitutional: Negative for chills, fever and malaise/fatigue.  Eyes: Negative for blurred vision.  Respiratory: Negative for shortness of breath.   Cardiovascular: Negative for chest pain and palpitations.  Gastrointestinal: Negative for abdominal pain and nausea.  Genitourinary: Negative for dysuria and hematuria.  Musculoskeletal: Negative for joint pain and myalgias.  Skin: Negative for rash.  Neurological: Negative for tingling and headaches.  Psychiatric/Behavioral: Negative for depression. The patient is not nervous/anxious.     Objective:  BP 133/82 (BP Location: Left Arm, Patient Position: Sitting, Cuff Size: Large)   Pulse 69   Temp 98 F (36.7 C) (Oral)   Ht 6\' 3"  (1.905 m)   SpO2 97%   BP/Weight 01/06/2017 12/21/2016 12/16/2016  Systolic BP 133 130 127  Diastolic BP 82 90 80  Wt. (Lbs) - - 230  BMI - - 28.75      Physical Exam  Constitutional: He is oriented to  person, place, and time.  Well developed, overweigh, AKA right side, NAD, reserved  HENT:  Head: Normocephalic and atraumatic.  Eyes: No scleral icterus.  Cardiovascular: Normal rate, regular rhythm and normal heart sounds.   Pulmonary/Chest: Effort normal and breath sounds normal.  Musculoskeletal:  Edematous right thigh. No increased warmth, erythema, or ecchymosis  Neurological: He is alert and oriented to person, place, and time.  Skin: Skin is warm and dry. No rash noted. No erythema. No pallor.  Scant weeping and crusting on the medial most staples on stump. 33 staples removed from stump. Some staples  barely visible 2/2 skin overgrowth. Stump was wiped with alcohol previous to staple removal and cleaned with iodine at end of removal. Nonadherent pads used to cover the staple sites. Overall, skin was well approximated with a small area of old dehiscence that scabbed over.  Psychiatric: He has a normal mood and affect. His behavior is normal. Thought content normal.  Vitals reviewed.    Assessment & Plan:   1. Encounter for removal of sutures - 33 staples removed.  2. Unilateral AKA (HCC)  3. DVT (deep vein thrombosis) in pregnancy (HCC) - Continue to take Xarelto Starter Pack as directed. Begin Xarelto 20mg  after finishing starter pack. I advised patient to speak to pharmacy at [facility] to see if he would be eligible to receive continue Xarelto treatment. I will see him again before his starter pack runs out to monitor continued anticoagulation treatment.   4. Screening for diabetes mellitus - HgB A1c 5.6% in clinic today.   Meds ordered this encounter  Medications  . rivaroxaban (XARELTO) 20 MG TABS tablet    Sig: Take 1 tablet (20 mg total) by mouth daily with supper.    Dispense:  30 tablet    Refill:  5    Order Specific Question:   Supervising Provider    Answer:   [name] [[id number]]    Follow-up: Return in about 2 weeks (around 01/20/2017) for f/u anticoagulant use for DVT.   Loletta Specter PA

## 2017-01-14 NOTE — Addendum Note (Signed)
Addendum  created 01/14/17 1319 by Nilesh Stegall, MD   Sign clinical note    

## 2017-01-17 ENCOUNTER — Encounter: Payer: Self-pay | Admitting: Vascular Surgery

## 2017-01-20 ENCOUNTER — Encounter: Payer: Self-pay | Admitting: Vascular Surgery

## 2017-01-20 ENCOUNTER — Ambulatory Visit (INDEPENDENT_AMBULATORY_CARE_PROVIDER_SITE_OTHER): Payer: Self-pay | Admitting: Vascular Surgery

## 2017-01-20 VITALS — BP 116/76 | HR 72 | Temp 98.5°F | Resp 18 | Ht 75.0 in | Wt 230.0 lb

## 2017-01-20 DIAGNOSIS — I739 Peripheral vascular disease, unspecified: Secondary | ICD-10-CM

## 2017-01-20 NOTE — Progress Notes (Signed)
Patient is a 56 year old male who returns for follow-up today.  He underwent right above-knee amputation 11/15/2016. He subsequently developed a DVT in the amputation site. He was placed on Xarelto.  Swelling and pain in his right above-knee amputation are improved. He is still having some drainage from the medial corner. His left leg has no symptoms. He has had no bleeding issues with the Xarelto.  Current Outpatient Prescriptions on File Prior to Visit  Medication Sig Dispense Refill  . rivaroxaban (XARELTO) 20 MG TABS tablet Take 1 tablet (20 mg total) by mouth daily with supper. 30 tablet 5  . atorvastatin (LIPITOR) 40 MG tablet Take 1 tablet (40 mg total) by mouth daily at 6 PM. (Patient not taking: Reported on 01/20/2017) 30 tablet 0  . gabapentin (NEURONTIN) 100 MG capsule Take 1 capsule (100 mg total) by mouth 3 (three) times daily. (Patient not taking: Reported on 01/20/2017) 90 capsule 0  . HYDROcodone-acetaminophen (NORCO/VICODIN) 5-325 MG tablet Take 1-2 tablets by mouth every 8 (eight) hours as needed for moderate pain. See instructions to wean narcotics. (Patient not taking: Reported on 01/20/2017) 60 tablet 0  . methocarbamol (ROBAXIN) 500 MG tablet Take 1 tablet (500 mg total) by mouth every 6 (six) hours as needed for muscle spasms. (Patient not taking: Reported on 01/20/2017) 90 tablet 0  . nortriptyline (PAMELOR) 25 MG capsule Take 1 capsule (25 mg total) by mouth at bedtime. (Patient not taking: Reported on 01/20/2017) 30 capsule 0  . pantoprazole (PROTONIX) 40 MG tablet Take 1 tablet (40 mg total) by mouth daily. (Patient not taking: Reported on 01/20/2017) 30 tablet 0  . Rivaroxaban 15 & 20 MG TBPK Take as directed on package: Start with one 15mg  tablet by mouth twice a day with food. On Day 22, switch to one 20mg  tablet once a day with food. (Patient not taking: Reported on 01/20/2017) 51 each 0  . senna-docusate (SENOKOT-S) 8.6-50 MG tablet Take 2 tablets by mouth 2 (two) times daily. (Patient  not taking: Reported on 01/20/2017) 100 tablet 0  . traMADol (ULTRAM) 50 MG tablet Take 1 tablet (50 mg total) by mouth every 6 (six) hours as needed for moderate pain. (Patient not taking: Reported on 01/20/2017) 90 tablet 0   No current facility-administered medications on file prior to visit.      Physical exam:  Vitals:   01/20/17 0911  BP: 116/76  Pulse: 72  Resp: 18  Temp: 98.5 F (36.9 C)  TempSrc: Oral  SpO2: 98%  Weight: 230 lb (104.3 kg)  Height: 6\' 3"  (1.905 m)    Extremities: Healing right above-knee amputation much less edematous than on his previous visit one month ago. Small area of skin separation with a small amount of serous drainage in the medial corner. Area of eschar 3 x 2 cm less than 1 mm depth. Left leg no palpable pedal pulses foot warm no ulcerations since  Assessment: #1 DVT on Xarelto symptoms improved with decreased swelling #2 slowly healing right above-knee amputation will keep dry gauze over the corner portion which is slightly open to protect this from his shrinker. He will follow-up in 6 weeks to make sure that the leg is completely healed. Plan will be to go for 5 more months on his Xarelto.  Plan: See above  Fabienne Brunsharles Fields, MD [facility] Office: [phone] Pager: [phone]

## 2017-01-24 ENCOUNTER — Emergency Department (HOSPITAL_COMMUNITY): Payer: Medicaid Other

## 2017-01-24 ENCOUNTER — Encounter (HOSPITAL_COMMUNITY): Payer: Self-pay

## 2017-01-24 ENCOUNTER — Inpatient Hospital Stay (HOSPITAL_COMMUNITY)
Admission: EM | Admit: 2017-01-24 | Discharge: 2017-01-26 | DRG: 101 | Disposition: A | Discharge status: Home Health | Payer: Medicaid Other | Attending: Internal Medicine | Admitting: Internal Medicine

## 2017-01-24 ENCOUNTER — Ambulatory Visit (INDEPENDENT_AMBULATORY_CARE_PROVIDER_SITE_OTHER): Payer: Self-pay | Admitting: Physician Assistant

## 2017-01-24 DIAGNOSIS — F1010 Alcohol abuse, uncomplicated: Secondary | ICD-10-CM | POA: Diagnosis present

## 2017-01-24 DIAGNOSIS — E785 Hyperlipidemia, unspecified: Secondary | ICD-10-CM | POA: Diagnosis present

## 2017-01-24 DIAGNOSIS — I639 Cerebral infarction, unspecified: Secondary | ICD-10-CM | POA: Diagnosis present

## 2017-01-24 DIAGNOSIS — F32A Depression, unspecified: Secondary | ICD-10-CM | POA: Diagnosis present

## 2017-01-24 DIAGNOSIS — I82409 Acute embolism and thrombosis of unspecified deep veins of unspecified lower extremity: Secondary | ICD-10-CM | POA: Diagnosis present

## 2017-01-24 DIAGNOSIS — Z86718 Personal history of other venous thrombosis and embolism: Secondary | ICD-10-CM

## 2017-01-24 DIAGNOSIS — E669 Obesity, unspecified: Secondary | ICD-10-CM | POA: Diagnosis present

## 2017-01-24 DIAGNOSIS — I10 Essential (primary) hypertension: Secondary | ICD-10-CM | POA: Diagnosis present

## 2017-01-24 DIAGNOSIS — I69951 Hemiplegia and hemiparesis following unspecified cerebrovascular disease affecting right dominant side: Secondary | ICD-10-CM

## 2017-01-24 DIAGNOSIS — Z6829 Body mass index (BMI) 29.0-29.9, adult: Secondary | ICD-10-CM

## 2017-01-24 DIAGNOSIS — R569 Unspecified convulsions: Principal | ICD-10-CM

## 2017-01-24 DIAGNOSIS — E1142 Type 2 diabetes mellitus with diabetic polyneuropathy: Secondary | ICD-10-CM | POA: Diagnosis present

## 2017-01-24 DIAGNOSIS — R9401 Abnormal electroencephalogram [EEG]: Secondary | ICD-10-CM | POA: Diagnosis present

## 2017-01-24 DIAGNOSIS — F17210 Nicotine dependence, cigarettes, uncomplicated: Secondary | ICD-10-CM | POA: Diagnosis present

## 2017-01-24 DIAGNOSIS — E872 Acidosis: Secondary | ICD-10-CM | POA: Diagnosis present

## 2017-01-24 DIAGNOSIS — G546 Phantom limb syndrome with pain: Secondary | ICD-10-CM | POA: Diagnosis present

## 2017-01-24 DIAGNOSIS — Z89511 Acquired absence of right leg below knee: Secondary | ICD-10-CM

## 2017-01-24 DIAGNOSIS — Z720 Tobacco use: Secondary | ICD-10-CM | POA: Diagnosis present

## 2017-01-24 DIAGNOSIS — I2510 Atherosclerotic heart disease of native coronary artery without angina pectoris: Secondary | ICD-10-CM | POA: Diagnosis present

## 2017-01-24 DIAGNOSIS — Z79899 Other long term (current) drug therapy: Secondary | ICD-10-CM

## 2017-01-24 DIAGNOSIS — IMO0002 Reserved for concepts with insufficient information to code with codable children: Secondary | ICD-10-CM | POA: Diagnosis present

## 2017-01-24 DIAGNOSIS — Z9114 Patient's other noncompliance with medication regimen: Secondary | ICD-10-CM

## 2017-01-24 DIAGNOSIS — E1169 Type 2 diabetes mellitus with other specified complication: Secondary | ICD-10-CM | POA: Diagnosis present

## 2017-01-24 DIAGNOSIS — E119 Type 2 diabetes mellitus without complications: Secondary | ICD-10-CM | POA: Diagnosis present

## 2017-01-24 DIAGNOSIS — F1090 Alcohol use, unspecified, uncomplicated: Secondary | ICD-10-CM | POA: Diagnosis present

## 2017-01-24 DIAGNOSIS — F329 Major depressive disorder, single episode, unspecified: Secondary | ICD-10-CM | POA: Diagnosis present

## 2017-01-24 DIAGNOSIS — K219 Gastro-esophageal reflux disease without esophagitis: Secondary | ICD-10-CM | POA: Diagnosis present

## 2017-01-24 HISTORY — DX: Unspecified convulsions: R56.9

## 2017-01-24 LAB — CBC WITH DIFFERENTIAL/PLATELET
Basophils Absolute: 0 10*3/uL (ref 0.0–0.1)
Basophils Relative: 0 %
EOS PCT: 0 %
Eosinophils Absolute: 0 10*3/uL (ref 0.0–0.7)
HEMATOCRIT: 44.8 % (ref 39.0–52.0)
Hemoglobin: 14.7 g/dL (ref 13.0–17.0)
LYMPHS ABS: 1.2 10*3/uL (ref 0.7–4.0)
LYMPHS PCT: 12 %
MCH: 29.9 pg (ref 26.0–34.0)
MCHC: 32.8 g/dL (ref 30.0–36.0)
MCV: 91.2 fL (ref 78.0–100.0)
MONO ABS: 0.3 10*3/uL (ref 0.1–1.0)
Monocytes Relative: 3 %
NEUTROS ABS: 8.2 10*3/uL — AB (ref 1.7–7.7)
Neutrophils Relative %: 85 %
PLATELETS: 170 10*3/uL (ref 150–400)
RBC: 4.91 MIL/uL (ref 4.22–5.81)
RDW: 14.8 % (ref 11.5–15.5)
WBC: 9.7 10*3/uL (ref 4.0–10.5)

## 2017-01-24 LAB — BASIC METABOLIC PANEL
ANION GAP: 13 (ref 5–15)
BUN: 8 mg/dL (ref 6–20)
CO2: 19 mmol/L — AB (ref 22–32)
Calcium: 9.4 mg/dL (ref 8.9–10.3)
Chloride: 106 mmol/L (ref 101–111)
Creatinine, Ser: 0.96 mg/dL (ref 0.61–1.24)
GFR calc Af Amer: >60 mL/min (ref >60)
GLUCOSE: 155 mg/dL — AB (ref 65–99)
POTASSIUM: 3.7 mmol/L (ref 3.5–5.1)
Sodium: 138 mmol/L (ref 135–145)

## 2017-01-24 LAB — HEPATIC FUNCTION PANEL
ALBUMIN: 4.1 g/dL (ref 3.5–5.0)
ALK PHOS: 69 U/L (ref 38–126)
ALT: 23 U/L (ref 17–63)
AST: 26 U/L (ref 15–41)
BILIRUBIN TOTAL: 0.6 mg/dL (ref 0.3–1.2)
Total Protein: 7.3 g/dL (ref 6.5–8.1)

## 2017-01-24 LAB — CBG MONITORING, ED: Glucose-Capillary: 142 mg/dL — ABNORMAL HIGH (ref 65–99)

## 2017-01-24 LAB — ETHANOL: Alcohol, Ethyl (B): <5 mg/dL (ref <5)

## 2017-01-24 MED ORDER — LORAZEPAM 2 MG/ML IJ SOLN
1.0000 mg | Freq: Once | INTRAMUSCULAR | Status: AC
  Administered 2017-01-24: 1 mg via INTRAVENOUS
  Filled 2017-01-24: qty 1

## 2017-01-24 MED ORDER — SODIUM CHLORIDE 0.9 % IV BOLUS (SEPSIS)
1000.0000 mL | Freq: Once | INTRAVENOUS | Status: AC
  Administered 2017-01-24: 1000 mL via INTRAVENOUS

## 2017-01-24 MED ORDER — SODIUM CHLORIDE 0.9 % IV SOLN
1000.0000 mg | Freq: Once | INTRAVENOUS | Status: AC
  Administered 2017-01-24: 1000 mg via INTRAVENOUS
  Filled 2017-01-24: qty 10

## 2017-01-24 NOTE — ED Notes (Signed)
ED Provider at bedside. 

## 2017-01-24 NOTE — ED Provider Notes (Signed)
[facility] DEPT Provider Note   CSN: [id number] Arrival date & time: 01/24/17  2123     History   Chief Complaint Chief Complaint  Patient presents with  . Seizures    HPI Ronald Forbes is a 56 y.o. male.  HPI    Ronald Forbes is a 56 y.o. male, with a history of Seizures, DM, and CVA, presenting to the ED with seizures that occurred today. Patient's sister, Ronald Forbes, is at the bedside and provides history information. States patient had three seizures today; at around 12pm, 4pm, and 8:30pm. "They seemed like they lasted forever, but maybe they were 5-10 minutes." She describes full body shaking, eyes rolled in the head, and foaming at the mouth. States she thinks his last seizure was in childhood. Denies confusion at baseline. Pt was previously living by himself, even after the stroke. Sister moved down to live with him in March 2018. Sister states patient is currently mentating normally.  Patient states he is not on seizure medications. States he has right sided weakness and speech difficulty due to stroke. Endorses urinary incontinence today and now "feeling sleepy." Denies CP, SOB, HA, fever/chills, recent illness, new neuro deficits, recent falls/trauma, or any other complaints.     Past Medical History:  Diagnosis Date  . Alcohol use disorder (HCC)   . Cerebral vascular accident (HCC)   . 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 Diabetes mellitus type 2 in obese (HCC)   . HLD (hyperlipidemia)   . Morbid obesity (HCC)   . Seizures (HCC)   . Stroke (HCC)   . Tobacco abuse     Patient Active Problem List   Diagnosis Date Noted  . Seizure (HCC) 01/25/2017  . Depression 01/25/2017  . DVT (deep venous thrombosis) (HCC) 01/25/2017  . Hemiparesis affecting right side as late effect of cerebrovascular accident (CVA) (HCC) 12/07/2016  . Type 2 diabetes mellitus with diabetic peripheral angiopathy without gangrene,  without long-term current use of insulin (HCC)   . 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 History of CVA with residual deficit   . Phantom limb pain (HCC)   . 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 Unilateral AKA, right (HCC)   . Diabetic peripheral neuropathy (HCC)   . Diabetes mellitus type 2 in nonobese (HCC)   . Reactive hypertension   . Type 2 diabetes mellitus with peripheral neuropathy (HCC)   . Benign essential HTN   . Hypokalemia   . Hypoalbuminemia due to protein-calorie malnutrition (HCC)   . Acute blood loss anemia   . Debilitated 10/28/2016  . Ischemia of right lower extremity 10/25/2016  . PAD (peripheral artery disease) (HCC) 10/20/2016  . Thyroid nodule 02/29/2016  . 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 Cerebrovascular accident (CVA) due to embolism of left posterior cerebral artery (HCC)   . Diabetes mellitus type 2 in obese (HCC) 02/07/2016  . HLD (hyperlipidemia)   . Tobacco abuse 02/04/2016  . Alcohol use disorder (HCC) 02/04/2016  . Morbid obesity (HCC) 02/04/2016  . CVA (cerebral vascular accident) (HCC) 02/04/2016    Past Surgical History:  Procedure Laterality Date  . ABDOMINAL AORTOGRAM W/LOWER EXTREMITY Right 10/20/2016   Procedure: Abdominal Aortogram w/Lower Extremity;  Surgeon: Maeola Harman, MD;  Location: [facility] INVASIVE CV LAB;  Service: Cardiovascular;  Laterality: Right;  lower leg  . AMPUTATION Right 11/15/2016   Procedure: AMPUTATION ABOVE KNEE;  Surgeon: Sherren Kernsharles E Fields, MD;  [facility];  Service: Vascular;  Laterality: Right;  . EP IMPLANTABLE DEVICE N/A 02/09/2016   Procedure: Loop Recorder Insertion;  Surgeon: Duke SalviaSteven C Klein, MD;  [facility];  Service: Cardiovascular;  Laterality: N/A;  . FEMORAL-TIBIAL BYPASS GRAFT Right 10/25/2016   Procedure: BYPASS GRAFT FEMORAL-TIBIAL ARTERY USING NON REVERSED SAPPHENOUS VEIN;  Surgeon: Sherren Kernsharles E Fields, MD;  [facility];  Service: Vascular;  Laterality: Right;  . INTRAOPERATIVE ARTERIOGRAM Right 10/25/2016   Procedure: INTRA OPERATIVE ARTERIOGRAM;  Surgeon: Sherren Kernsharles E Fields, MD;  [facility];  Service: Vascular;  Laterality: Right;  . TEE WITHOUT CARDIOVERSION N/A 02/06/2016   Procedure: TRANSESOPHAGEAL ECHOCARDIOGRAM (TEE);  Surgeon: Wendall StadePeter C Nishan, MD;  [facility];  Service: Cardiovascular;  Laterality: N/A;       Home Medications    Prior to Admission medications   Medication Sig Start Date End Date Taking? Authorizing Provider  atorvastatin (LIPITOR) 40 MG tablet Take 1 tablet (40 mg total) by mouth daily at 6 PM. Patient not taking: Reported on 01/20/2017 11/23/16   Love, Evlyn KannerPamela S, PA-C  gabapentin (NEURONTIN) 100 MG capsule Take 1 capsule (100 mg total) by mouth 3 (three) times daily. Patient not taking: Reported on 01/20/2017 11/24/16   Love, Evlyn KannerPamela S, PA-C  HYDROcodone-acetaminophen (NORCO/VICODIN) 5-325 MG tablet Take 1-2 tablets by mouth every 8 (eight) hours as needed for moderate pain. See instructions to wean narcotics. Patient not taking: Reported on 01/20/2017 12/07/16   Erick ColaceKirsteins, Andrew E, MD  methocarbamol (ROBAXIN) 500 MG tablet Take 1 tablet (500 mg total) by mouth every 6 (six) hours as needed for muscle spasms. Patient not taking: Reported on 01/20/2017 11/24/16   Love, Evlyn KannerPamela S, PA-C  nortriptyline (PAMELOR) 25 MG capsule Take 1 capsule (25 mg total) by mouth at bedtime. Patient not taking: Reported on 01/20/2017 11/24/16   Love, Evlyn KannerPamela S, PA-C  pantoprazole (PROTONIX) 40 MG tablet Take 1 tablet (40 mg total) by mouth daily. Patient not taking: Reported on 01/20/2017 11/24/16   Love, Evlyn KannerPamela S, PA-C  rivaroxaban (XARELTO) 20 MG TABS tablet Take 1 tablet (20 mg total) by mouth daily with supper. Patient not taking: Reported on 01/25/2017 01/06/17   Loletta SpecterGomez, Roger David, PA-C  Rivaroxaban 15 & 20 MG TBPK Take as directed on package: Start with one 15mg  tablet by mouth twice a day with  food. On Day 22, switch to one 20mg  tablet once a day with food. Patient not taking: Reported on 01/20/2017 12/21/16   Linwood DibblesKnapp, Jon, MD  senna-docusate (SENOKOT-S) 8.6-50 MG tablet Take 2 tablets by mouth 2 (two) times daily. Patient not taking: Reported on 01/20/2017 11/23/16   Love, Evlyn KannerPamela S, PA-C  traMADol (ULTRAM) 50 MG tablet Take 1 tablet (50 mg total) by mouth every 6 (six) hours as needed for moderate pain. Patient not taking: Reported on 01/20/2017 11/24/16   Love, Evlyn KannerPamela S, PA-C    Family History Family History  Problem Relation Age of Onset  . Hypertension Mother     Social History Social History  Substance Use Topics  . Smoking status: Current Every Day Smoker    Packs/day: 1.00    Years: 40.00    Types: Cigarettes    Last attempt to quit: 10/13/2016  . Smokeless tobacco: Never Used  . Alcohol use 0.0 oz/week     Comment: 6 pack/day     Allergies   Patient has no known allergies.   Review of Systems Review  of Systems  Constitutional: Positive for fatigue. Negative for chills, diaphoresis and fever.  Respiratory: Negative for cough and shortness of breath.   Cardiovascular: Negative for chest pain.  Gastrointestinal: Negative for abdominal pain, diarrhea, nausea and vomiting.  Genitourinary:       +urinary incontinence  Neurological: Positive for seizures. Negative for headaches.  All other systems reviewed and are negative.    Physical Exam Updated Vital Signs BP (!) 138/91   Pulse 87   Temp 98.6 F (37 C) (Oral)   Resp (!) 22   SpO2 97%   Physical Exam  Constitutional: He is oriented to person, place, and time. He appears well-developed and well-nourished. No distress.  HENT:  Head: Normocephalic and atraumatic.  Mouth/Throat: Oropharynx is clear and moist.  Eyes: Conjunctivae and EOM are normal.  Neck: Neck supple.  Cardiovascular: Normal rate, regular rhythm, normal heart sounds and intact distal pulses.   Pulmonary/Chest: No respiratory distress. He  has wheezes in the left lower field.  Abdominal: Soft. There is no tenderness. There is no guarding.  Musculoskeletal: He exhibits no edema.  Lymphadenopathy:    He has no cervical adenopathy.  Neurological: He is alert and oriented to person, place, and time.  Somnolent, but easily arousable. No noted acute sensory deficits. Right sided weakness in upper and lower extremities that patient states is normal for him. The left upper and lower extremities strength 5 out of 5. Cranial nerves III-XII otherwise grossly intact.  Skin: Skin is warm and dry. He is not diaphoretic.  Psychiatric: He has a normal mood and affect. His behavior is normal.  Nursing note and vitals reviewed.    ED Treatments / Results  Labs (all labs ordered are listed, but only abnormal results are displayed) Labs Reviewed  CBC WITH DIFFERENTIAL/PLATELET - Abnormal; Notable for the following:       Result Value   Neutro Abs 8.2 (*)    All other components within normal limits  BASIC METABOLIC PANEL - Abnormal; Notable for the following:    CO2 19 (*)    Glucose, Bld 155 (*)    All other components within normal limits  HEPATIC FUNCTION PANEL - Abnormal; Notable for the following:    Bilirubin, Direct <0.1 (*)    All other components within normal limits  TSH - Abnormal; Notable for the following:    TSH 0.010 (*)    All other components within normal limits  CBG MONITORING, ED - Abnormal; Notable for the following:    Glucose-Capillary 142 (*)    All other components within normal limits  ETHANOL  T4, FREE  CBC  RAPID URINE DRUG SCREEN, HOSP PERFORMED  URINALYSIS, ROUTINE W REFLEX MICROSCOPIC  T3, FREE  CK  LACTIC ACID, PLASMA  BASIC METABOLIC PANEL    EKG  EKG Interpretation  Date/Time:  Monday January 24 2017 21:32:23 EDT Ventricular Rate:  86 PR Interval:    QRS Duration: 111 QT Interval:  382 QTC Calculation: 457 R Axis:   32 Text Interpretation:  Sinus rhythm Probable left atrial enlargement  Abnormal R-wave progression, early transition Confirmed by Donnetta Hutching ([id number]) on 01/24/2017 10:00:51 PM       Radiology Ct Head Wo Contrast  Result Date: 01/24/2017 CLINICAL DATA:  Three seizures this evening. History of stroke last year. EXAM: CT HEAD WITHOUT CONTRAST TECHNIQUE: Contiguous axial images were obtained from the base of the skull through the vertex without intravenous contrast. COMPARISON:  05/22/2016 head CT and MRI. FINDINGS: Brain: Chronic  left temporal and occipital lobe encephalomalacia from chronic left PCA distribution infarct. Chronic lacunar infarct of the left thalamus. No acute intracranial hemorrhage, new large vascular territory infarction, intra-axial mass nor extra-axial fluid. No midline shift. Chronic small vessel ischemic disease of periventricular white white matter and centrum semiovale. Vascular: Moderate atherosclerosis of the carotid siphons. No hyperdense vessels. Skull: Negative for fracture or focal lesion. Sinuses/Orbits: No acute finding. Other: None IMPRESSION: 1. Chronic left PCA distribution infarcts again noted without significant change. 2. Chronic lacunar infarcts of the left thalamus. 3. Chronic small vessel ischemic disease of periventricular white matter. 4. No acute intracranial abnormality. Electronically Signed   By: Tollie Eth M.D.   On: 01/24/2017 22:23   Dg Chest Portable 1 View  Result Date: 01/24/2017 CLINICAL DATA:  Acute onset of seizure like activity. High blood pressure. Initial encounter. EXAM: PORTABLE CHEST 1 VIEW COMPARISON:  Chest radiograph performed 11/16/2016 FINDINGS: The lungs are well-aerated. Mild vascular congestion is noted. Increased interstitial markings may reflect mild interstitial edema. There is no evidence of pleural effusion or pneumothorax. The cardiomediastinal silhouette is within normal limits. No acute osseous abnormalities are seen. IMPRESSION: Mild vascular congestion. Increased interstitial markings may reflect  mild interstitial edema. Electronically Signed   By: Roanna Raider M.D.   On: 01/24/2017 23:08    Procedures Procedures (including critical care time)  Medications Ordered in ED Medications  rivaroxaban (XARELTO) tablet 20 mg (not administered)  atorvastatin (LIPITOR) tablet 40 mg (not administered)  nortriptyline (PAMELOR) capsule 25 mg (not administered)  pantoprazole (PROTONIX) EC tablet 40 mg (not administered)  senna-docusate (Senokot-S) tablet 2 tablet (not administered)  levETIRAcetam (KEPPRA) 500 mg in sodium chloride 0.9 % 100 mL IVPB (not administered)  LORazepam (ATIVAN) injection 1 mg (not administered)  nicotine (NICODERM CQ - dosed in mg/24 hours) patch 21 mg (not administered)  0.9 %  sodium chloride infusion (not administered)  insulin aspart (novoLOG) injection 0-9 Units (not administered)  insulin aspart (novoLOG) injection 0-5 Units (not administered)  LORazepam (ATIVAN) tablet 1 mg (not administered)    Or  LORazepam (ATIVAN) injection 1 mg (not administered)  thiamine (VITAMIN B-1) tablet 100 mg (not administered)    Or  thiamine (B-1) injection 100 mg (not administered)  folic acid (FOLVITE) tablet 1 mg (not administered)  multivitamin with minerals tablet 1 tablet (not administered)  LORazepam (ATIVAN) injection 0-4 mg (not administered)    Followed by  LORazepam (ATIVAN) injection 0-4 mg (not administered)  sodium chloride flush (NS) 0.9 % injection 3 mL (not administered)  acetaminophen (TYLENOL) tablet 650 mg (not administered)    Or  acetaminophen (TYLENOL) suppository 650 mg (not administered)  ondansetron (ZOFRAN) tablet 4 mg (not administered)    Or  ondansetron (ZOFRAN) injection 4 mg (not administered)  sodium chloride 0.9 % bolus 1,000 mL (0 mLs Intravenous Stopped 01/24/17 2325)  LORazepam (ATIVAN) injection 1 mg (1 mg Intravenous Given 01/24/17 2224)  levETIRAcetam (KEPPRA) 1,000 mg in sodium chloride 0.9 % 100 mL IVPB (0 mg Intravenous Stopped  01/24/17 2351)     Initial Impression / Assessment and Plan / ED Course  I have reviewed the triage vital signs and the nursing notes.  Pertinent labs & imaging results that were available during my care of the patient were reviewed by me and considered in my medical decision making (see chart for details).  Clinical Course as of Jan 25 358  Tue Jan 25, 2017  0020 Patient continues to be seizure-free here in the  ED.  [SJ]  0209 Spoke with Dr. Roseanne Reno, neurologist, who advises patient should get an MRI of the brain to assure he has not had another stroke and, if normal, can be discharged with Keppra 500mg  BID and outpatient neurology follow up.  [SJ]  0218 Spoke with patient and his sister about my conversation with neurology. They are very apprehensive about the possibility of discharge. They are new to patient's seizures and do not feel comfortable with discharge. Will try to get patient admitted.  [SJ]  0227 Spoke with Dr. Clyde Lundborg, hospitalist, who agrees to admit the patient. Will have to hold MRI until we can determine if patient still has a loop recorder in place. Patient states he does not know.  [SJ]    Clinical Course User Index [SJ] Ikeya Brockel C, PA-C    Patient presents with what essentially will be a new workup for seizures. He is unmedicated for seizures. He was lost to follow up following his left PCA/midbrain CVA in June 2017. Admission for continued workup and management.  Findings and plan of care discussed with Donnetta Hutching, MD.    Vitals:   01/24/17 2330 01/25/17 0000 01/25/17 0130 01/25/17 0300  BP: 129/85 128/86 129/77 122/85  Pulse: 90 89    Resp: (!) 21  (!) 23 18  Temp:      TempSrc:      SpO2: 94%  94%       Final Clinical Impressions(s) / ED Diagnoses   Final diagnoses:  Seizure [facility])    New Prescriptions New Prescriptions   No medications on file     Concepcion Living 01/25/17 0359    Donnetta Hutching, MD 01/31/17 1242

## 2017-01-24 NOTE — ED Triage Notes (Signed)
PER EMS: pt from home with c/o seizure like activity and states wife reported this was his third seizure today,last one at 2030, lasting about 5-8 minutes. Pt was post-ictal on EMS arrival but is now more alert and oriented. Hx of stroke last year and since then he has had seizures more often, per wife. BP- 155/91, HR-98, RR-16, CBG-134

## 2017-01-24 NOTE — ED Notes (Signed)
[facility]

## 2017-01-24 NOTE — ED Notes (Signed)
Pt continues to be postictal.  Sleeping in room.  His sister is at the bedside.

## 2017-01-25 ENCOUNTER — Inpatient Hospital Stay (HOSPITAL_COMMUNITY): Payer: Medicaid Other

## 2017-01-25 ENCOUNTER — Observation Stay (HOSPITAL_BASED_OUTPATIENT_CLINIC_OR_DEPARTMENT_OTHER)
Admit: 2017-01-25 | Discharge: 2017-01-25 | Disposition: A | Discharge status: Home / Self Care | Payer: Medicaid Other | Attending: Family Medicine | Admitting: Family Medicine

## 2017-01-25 ENCOUNTER — Encounter (HOSPITAL_COMMUNITY): Payer: Self-pay | Admitting: Family Medicine

## 2017-01-25 DIAGNOSIS — F17210 Nicotine dependence, cigarettes, uncomplicated: Secondary | ICD-10-CM | POA: Diagnosis present

## 2017-01-25 DIAGNOSIS — Z79899 Other long term (current) drug therapy: Secondary | ICD-10-CM | POA: Diagnosis not present

## 2017-01-25 DIAGNOSIS — G40909 Epilepsy, unspecified, not intractable, without status epilepticus: Secondary | ICD-10-CM

## 2017-01-25 DIAGNOSIS — R9401 Abnormal electroencephalogram [EEG]: Secondary | ICD-10-CM | POA: Diagnosis present

## 2017-01-25 DIAGNOSIS — Z720 Tobacco use: Secondary | ICD-10-CM | POA: Diagnosis not present

## 2017-01-25 DIAGNOSIS — I63332 Cerebral infarction due to thrombosis of left posterior cerebral artery: Secondary | ICD-10-CM | POA: Diagnosis not present

## 2017-01-25 DIAGNOSIS — F329 Major depressive disorder, single episode, unspecified: Secondary | ICD-10-CM | POA: Diagnosis present

## 2017-01-25 DIAGNOSIS — I82409 Acute embolism and thrombosis of unspecified deep veins of unspecified lower extremity: Secondary | ICD-10-CM | POA: Diagnosis present

## 2017-01-25 DIAGNOSIS — K219 Gastro-esophageal reflux disease without esophagitis: Secondary | ICD-10-CM | POA: Diagnosis present

## 2017-01-25 DIAGNOSIS — Z86718 Personal history of other venous thrombosis and embolism: Secondary | ICD-10-CM | POA: Diagnosis not present

## 2017-01-25 DIAGNOSIS — E1169 Type 2 diabetes mellitus with other specified complication: Secondary | ICD-10-CM | POA: Diagnosis not present

## 2017-01-25 DIAGNOSIS — E872 Acidosis: Secondary | ICD-10-CM | POA: Diagnosis present

## 2017-01-25 DIAGNOSIS — F1099 Alcohol use, unspecified with unspecified alcohol-induced disorder: Secondary | ICD-10-CM | POA: Diagnosis not present

## 2017-01-25 DIAGNOSIS — F1010 Alcohol abuse, uncomplicated: Secondary | ICD-10-CM | POA: Diagnosis present

## 2017-01-25 DIAGNOSIS — E785 Hyperlipidemia, unspecified: Secondary | ICD-10-CM | POA: Diagnosis present

## 2017-01-25 DIAGNOSIS — E669 Obesity, unspecified: Secondary | ICD-10-CM | POA: Diagnosis present

## 2017-01-25 DIAGNOSIS — R569 Unspecified convulsions: Secondary | ICD-10-CM | POA: Diagnosis present

## 2017-01-25 DIAGNOSIS — I824Y9 Acute embolism and thrombosis of unspecified deep veins of unspecified proximal lower extremity: Secondary | ICD-10-CM | POA: Diagnosis not present

## 2017-01-25 DIAGNOSIS — Z6829 Body mass index (BMI) 29.0-29.9, adult: Secondary | ICD-10-CM | POA: Diagnosis not present

## 2017-01-25 DIAGNOSIS — I10 Essential (primary) hypertension: Secondary | ICD-10-CM | POA: Diagnosis present

## 2017-01-25 DIAGNOSIS — Z9114 Patient's other noncompliance with medication regimen: Secondary | ICD-10-CM | POA: Diagnosis not present

## 2017-01-25 DIAGNOSIS — G546 Phantom limb syndrome with pain: Secondary | ICD-10-CM | POA: Diagnosis present

## 2017-01-25 DIAGNOSIS — I69951 Hemiplegia and hemiparesis following unspecified cerebrovascular disease affecting right dominant side: Secondary | ICD-10-CM | POA: Diagnosis not present

## 2017-01-25 DIAGNOSIS — I2510 Atherosclerotic heart disease of native coronary artery without angina pectoris: Secondary | ICD-10-CM | POA: Diagnosis present

## 2017-01-25 DIAGNOSIS — Z89511 Acquired absence of right leg below knee: Secondary | ICD-10-CM | POA: Diagnosis not present

## 2017-01-25 DIAGNOSIS — E1142 Type 2 diabetes mellitus with diabetic polyneuropathy: Secondary | ICD-10-CM | POA: Diagnosis present

## 2017-01-25 DIAGNOSIS — F32A Depression, unspecified: Secondary | ICD-10-CM | POA: Diagnosis present

## 2017-01-25 HISTORY — DX: Acute embolism and thrombosis of unspecified deep veins of unspecified lower extremity: I82.409

## 2017-01-25 LAB — RAPID URINE DRUG SCREEN, HOSP PERFORMED
Amphetamines: NOT DETECTED
Barbiturates: NOT DETECTED
Benzodiazepines: POSITIVE — AB
COCAINE: NOT DETECTED
OPIATES: NOT DETECTED
TETRAHYDROCANNABINOL: POSITIVE — AB

## 2017-01-25 LAB — URINALYSIS, ROUTINE W REFLEX MICROSCOPIC
BACTERIA UA: NONE SEEN
BILIRUBIN URINE: NEGATIVE
Glucose, UA: NEGATIVE mg/dL
Hgb urine dipstick: NEGATIVE
Ketones, ur: NEGATIVE mg/dL
Leukocytes, UA: NEGATIVE
Nitrite: NEGATIVE
Protein, ur: 30 mg/dL — AB
RBC / HPF: NONE SEEN RBC/hpf (ref 0–5)
SPECIFIC GRAVITY, URINE: 1.021 (ref 1.005–1.030)
pH: 5 (ref 5.0–8.0)

## 2017-01-25 LAB — GLUCOSE, CAPILLARY
GLUCOSE-CAPILLARY: 107 mg/dL — AB (ref 65–99)
GLUCOSE-CAPILLARY: 123 mg/dL — AB (ref 65–99)
GLUCOSE-CAPILLARY: 112 mg/dL — AB (ref 65–99)
Glucose-Capillary: 96 mg/dL (ref 65–99)

## 2017-01-25 LAB — CBC
HCT: 41 % (ref 39.0–52.0)
HEMOGLOBIN: 13.9 g/dL (ref 13.0–17.0)
MCH: 30.5 pg (ref 26.0–34.0)
MCHC: 33.9 g/dL (ref 30.0–36.0)
MCV: 89.9 fL (ref 78.0–100.0)
Platelets: 157 10*3/uL (ref 150–400)
RBC: 4.56 MIL/uL (ref 4.22–5.81)
RDW: 14.7 % (ref 11.5–15.5)
WBC: 8.6 10*3/uL (ref 4.0–10.5)

## 2017-01-25 LAB — TSH: TSH: 0.01 u[IU]/mL — AB (ref 0.350–4.500)

## 2017-01-25 LAB — LACTIC ACID, PLASMA: Lactic Acid, Venous: 1.1 mmol/L (ref 0.5–1.9)

## 2017-01-25 LAB — BASIC METABOLIC PANEL
ANION GAP: 8 (ref 5–15)
BUN: 9 mg/dL (ref 6–20)
CALCIUM: 8.9 mg/dL (ref 8.9–10.3)
CO2: 20 mmol/L — AB (ref 22–32)
CREATININE: 0.77 mg/dL (ref 0.61–1.24)
Chloride: 108 mmol/L (ref 101–111)
GFR calc non Af Amer: >60 mL/min (ref >60)
GLUCOSE: 111 mg/dL — AB (ref 65–99)
Potassium: 3.4 mmol/L — ABNORMAL LOW (ref 3.5–5.1)
Sodium: 136 mmol/L (ref 135–145)

## 2017-01-25 LAB — T4, FREE: FREE T4: 1 ng/dL (ref 0.61–1.12)

## 2017-01-25 LAB — CK: CK TOTAL: 585 U/L — AB (ref 49–397)

## 2017-01-25 MED ORDER — LORAZEPAM 2 MG/ML IJ SOLN
1.0000 mg | INTRAMUSCULAR | Status: DC | PRN
Start: 2017-01-25 — End: 2017-01-26

## 2017-01-25 MED ORDER — SODIUM CHLORIDE 0.9 % IV SOLN
500.0000 mg | Freq: Two times a day (BID) | INTRAVENOUS | Status: DC
  Administered 2017-01-25 (×2): 500 mg via INTRAVENOUS
  Filled 2017-01-25 (×3): qty 5

## 2017-01-25 MED ORDER — ASPIRIN 325 MG PO TABS
ORAL_TABLET | ORAL | Status: AC
  Filled 2017-01-25: qty 1

## 2017-01-25 MED ORDER — NICOTINE 21 MG/24HR TD PT24
21.0000 mg | MEDICATED_PATCH | Freq: Every day | TRANSDERMAL | Status: DC
  Administered 2017-01-25 – 2017-01-26 (×2): 21 mg via TRANSDERMAL
  Filled 2017-01-25 (×3): qty 1

## 2017-01-25 MED ORDER — FOLIC ACID 1 MG PO TABS
1.0000 mg | ORAL_TABLET | Freq: Every day | ORAL | Status: DC
  Administered 2017-01-25 – 2017-01-26 (×2): 1 mg via ORAL
  Filled 2017-01-25 (×2): qty 1

## 2017-01-25 MED ORDER — NORTRIPTYLINE HCL 25 MG PO CAPS
25.0000 mg | ORAL_CAPSULE | Freq: Every day | ORAL | Status: DC
  Administered 2017-01-25: 25 mg via ORAL
  Filled 2017-01-25: qty 1

## 2017-01-25 MED ORDER — PANTOPRAZOLE SODIUM 40 MG PO TBEC
40.0000 mg | DELAYED_RELEASE_TABLET | Freq: Every day | ORAL | Status: DC
  Administered 2017-01-25 – 2017-01-26 (×2): 40 mg via ORAL
  Filled 2017-01-25 (×2): qty 1

## 2017-01-25 MED ORDER — THIAMINE HCL 100 MG/ML IJ SOLN: 100.0000 mg | Freq: Every day | INTRAMUSCULAR | Status: DC

## 2017-01-25 MED ORDER — INSULIN ASPART 100 UNIT/ML ~~LOC~~ SOLN: 0.0000 [IU] | Freq: Every day | SUBCUTANEOUS | Status: DC

## 2017-01-25 MED ORDER — ONDANSETRON HCL 4 MG PO TABS: 4.0000 mg | ORAL_TABLET | Freq: Four times a day (QID) | ORAL | Status: DC | PRN

## 2017-01-25 MED ORDER — SODIUM CHLORIDE 0.9% FLUSH
3.0000 mL | Freq: Two times a day (BID) | INTRAVENOUS | Status: DC
  Administered 2017-01-25 (×2): 3 mL via INTRAVENOUS

## 2017-01-25 MED ORDER — VITAMIN B-1 100 MG PO TABS
100.0000 mg | ORAL_TABLET | Freq: Every day | ORAL | Status: DC
  Administered 2017-01-25 – 2017-01-26 (×2): 100 mg via ORAL
  Filled 2017-01-25 (×2): qty 1

## 2017-01-25 MED ORDER — ONDANSETRON HCL 4 MG/2ML IJ SOLN: 4.0000 mg | Freq: Four times a day (QID) | INTRAMUSCULAR | Status: DC | PRN

## 2017-01-25 MED ORDER — ADULT MULTIVITAMIN W/MINERALS CH
1.0000 | ORAL_TABLET | Freq: Every day | ORAL | Status: DC
  Administered 2017-01-25 – 2017-01-26 (×2): 1 via ORAL
  Filled 2017-01-25 (×2): qty 1

## 2017-01-25 MED ORDER — LORAZEPAM 2 MG/ML IJ SOLN: 0.0000 mg | Freq: Four times a day (QID) | INTRAMUSCULAR | Status: DC

## 2017-01-25 MED ORDER — RIVAROXABAN 20 MG PO TABS
20.0000 mg | ORAL_TABLET | Freq: Every day | ORAL | Status: DC
  Administered 2017-01-25: 20 mg via ORAL
  Filled 2017-01-25: qty 1

## 2017-01-25 MED ORDER — ACETAMINOPHEN 650 MG RE SUPP
650.0000 mg | Freq: Four times a day (QID) | RECTAL | Status: DC | PRN
Start: 2017-01-25 — End: 2017-01-26

## 2017-01-25 MED ORDER — ASPIRIN 325 MG PO TABS
ORAL_TABLET | ORAL | Status: AC
  Administered 2017-01-25: 325 mg
  Filled 2017-01-25: qty 1

## 2017-01-25 MED ORDER — LORAZEPAM 2 MG/ML IJ SOLN: 1.0000 mg | Freq: Four times a day (QID) | INTRAMUSCULAR | Status: DC | PRN

## 2017-01-25 MED ORDER — SODIUM CHLORIDE 0.9 % IV SOLN
INTRAVENOUS | Status: DC
  Administered 2017-01-25 – 2017-01-26 (×2): via INTRAVENOUS

## 2017-01-25 MED ORDER — SENNOSIDES-DOCUSATE SODIUM 8.6-50 MG PO TABS: 2.0000 | ORAL_TABLET | Freq: Every evening | ORAL | Status: DC | PRN

## 2017-01-25 MED ORDER — LORAZEPAM 1 MG PO TABS: 1.0000 mg | ORAL_TABLET | Freq: Four times a day (QID) | ORAL | Status: DC | PRN

## 2017-01-25 MED ORDER — ACETAMINOPHEN 325 MG PO TABS: 650.0000 mg | ORAL_TABLET | Freq: Four times a day (QID) | ORAL | Status: DC | PRN

## 2017-01-25 MED ORDER — INSULIN ASPART 100 UNIT/ML ~~LOC~~ SOLN: 0.0000 [IU] | Freq: Three times a day (TID) | SUBCUTANEOUS | Status: DC

## 2017-01-25 MED ORDER — LORAZEPAM 2 MG/ML IJ SOLN: 0.0000 mg | Freq: Two times a day (BID) | INTRAMUSCULAR | Status: DC

## 2017-01-25 MED ORDER — ATORVASTATIN CALCIUM 40 MG PO TABS
40.0000 mg | ORAL_TABLET | Freq: Every day | ORAL | Status: DC
  Administered 2017-01-25: 40 mg via ORAL
  Filled 2017-01-25: qty 1

## 2017-01-25 NOTE — Discharge Instructions (Signed)
Information on my medicine - XARELTO (rivaroxaban)  This medication education was reviewed with me or my healthcare representative as part of my discharge preparation.   WHY WAS XARELTO PRESCRIBED FOR YOU? Xarelto was prescribed to treat blood clots that may have been found in the veins of your legs (deep vein thrombosis) or in your lungs (pulmonary embolism) and to reduce the risk of them occurring again.  What do you need to know about Xarelto? Continue to take xarelto one 20 mg tablet taken ONCE A DAY with your evening meal.  DO NOT stop taking Xarelto without talking to the health care provider who prescribed the medication.  Refill your prescription for 20 mg tablets before you run out.  After discharge, you should have regular check-up appointments with your healthcare provider that is prescribing your Xarelto.  In the future your dose may need to be changed if your kidney function changes by a significant amount.  What do you do if you miss a dose? If you are taking Xarelto TWICE DAILY and you miss a dose, take it as soon as you remember. You may take two 15 mg tablets (total 30 mg) at the same time then resume your regularly scheduled 15 mg twice daily the next day.  If you are taking Xarelto ONCE DAILY and you miss a dose, take it as soon as you remember on the same day then continue your regularly scheduled once daily regimen the next day. Do not take two doses of Xarelto at the same time.   Important Safety Information Xarelto is a blood thinner medicine that can cause bleeding. You should call your healthcare provider right away if you experience any of the following: ? Bleeding from an injury or your nose that does not stop. ? Unusual colored urine (red or dark brown) or unusual colored stools (red or black). ? Unusual bruising for unknown reasons. ? A serious fall or if you hit your head (even if there is no bleeding).  Some medicines may interact with Xarelto and  might increase your risk of bleeding while on Xarelto. To help avoid this, consult your healthcare provider or pharmacist prior to using any new prescription or non-prescription medications, including herbals, vitamins, non-steroidal anti-inflammatory drugs (NSAIDs) and supplements.  This website has more information on Xarelto: VisitDestination.com.[email or web address].xarelto.com.

## 2017-01-25 NOTE — ED Notes (Signed)
Pt has continued to be very sleepy.  Arousable to tactile stimuli initially and at this time arousable to verbal stimuli.  Pt is alert to self but cant tell me where he is or what year it is.  Pt told that it is 2018 and that he is at [place].  Pt was not able to repeat this back to me when I asked him a few minutes later.  Pt given a urinal and offered help with using it

## 2017-01-25 NOTE — ED Notes (Signed)
Pt sister: Quentin [facility]ie [facility] requested to call her for updates [phone]

## 2017-01-25 NOTE — Progress Notes (Signed)
EEG Completed; Results Pending  

## 2017-01-25 NOTE — Consult Note (Signed)
NEURO HOSPITALIST CONSULT NOTE   Requestig physician: Dr. Laural Benes  Reason for Consult: Seizure  History obtained from:  Patient     HPI:                                                                                                                                          Ronald Forbes is an 56 y.o. male with medical history significant of medication noncompliance, hyperlipidemia, diabetes mellitus, GERD, depression, tobacco abuse, stroke, seizure, obesity, alcohol abuse, CAD, s/p of right BKA, DVT, who presents with seizure.  Per report, pt has had 3 episodes of seizure at home. The last one was at about 8:30 PM, which lasted for 5-8 minutes. In the ED he was post ictal. He shook his head when asked if he has any chest pain, SOB, diarrhea or abdominal pain. CK in ED was 585. Patient was loaded with Keppra in ED and started on 500 mg BID.   Currently patient is very lethargic and is having a hard time finding words. He states that he did not know about the first seizure but the second seizure he found himself under his bed and then called EMS. Again history is very hard to obtain from patient as he is extremely lethargic and cannot remember a lot of what happened. At baseline he has a right hemianopsia right-sided weakness, and right-sided decreased sensation. Patient denied any drug or alcohol use, but has a history of such listed under his Past Social History in EPIC.    Following resolution of his post-ictal state, the patient states that he quit taking all of his medications 3 weeks ago due to cost. However, other statements he makes are not consistent with exam findings, therefore history as related by the patient at this time is felt to be unreliable.  Past Medical History:  Diagnosis Date  . Alcohol use disorder (HCC)   . Cerebral vascular accident (HCC)   . 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 Diabetes  mellitus type 2 in obese (HCC)   . HLD (hyperlipidemia)   . Morbid obesity (HCC)   . Seizures (HCC)   . Stroke (HCC)   . Tobacco abuse     Past Surgical History:  Procedure Laterality Date  . ABDOMINAL AORTOGRAM W/LOWER EXTREMITY Right 10/20/2016   Procedure: Abdominal Aortogram w/Lower Extremity;  Surgeon: Maeola Harman, MD;  Location: [facility] INVASIVE CV LAB;  Service: Cardiovascular;  Laterality: Right;  lower leg  . AMPUTATION Right 11/15/2016   Procedure: AMPUTATION ABOVE KNEE;  Surgeon: Sherren Kerns, MD;  Location: [facility] OR;  Service: Vascular;  Laterality: Right;  . EP IMPLANTABLE DEVICE N/A 02/09/2016   Procedure: Loop Recorder Insertion;  Surgeon: Duke Salvia, MD;  Location: [facility] INVASIVE CV LAB;  Service: Cardiovascular;  Laterality: N/A;  . FEMORAL-TIBIAL BYPASS GRAFT Right 10/25/2016   Procedure: BYPASS GRAFT FEMORAL-TIBIAL ARTERY USING NON REVERSED SAPPHENOUS VEIN;  Surgeon: Sherren Kerns, MD;  Location: [facility] OR;  Service: Vascular;  Laterality: Right;  . INTRAOPERATIVE ARTERIOGRAM Right 10/25/2016   Procedure: INTRA OPERATIVE ARTERIOGRAM;  Surgeon: Sherren Kerns, MD;  Location: [facility] OR;  Service: Vascular;  Laterality: Right;  . TEE WITHOUT CARDIOVERSION N/A 02/06/2016   Procedure: TRANSESOPHAGEAL ECHOCARDIOGRAM (TEE);  Surgeon: Wendall Stade, MD;  Location: [facility] ENDOSCOPY;  Service: Cardiovascular;  Laterality: N/A;    Family History  Problem Relation Age of Onset  . Hypertension Mother      Social History:  reports that he has been smoking Cigarettes.  He has a 40.00 pack-year smoking history. He has never used smokeless tobacco. He reports that he drinks alcohol. He reports that he uses drugs, including Marijuana.  No Known Allergies  MEDICATIONS:                                                                                                                     Prior to Admission:  Prescriptions Prior to Admission  Medication Sig Dispense Refill Last Dose  .  atorvastatin (LIPITOR) 40 MG tablet Take 1 tablet (40 mg total) by mouth daily at 6 PM. (Patient not taking: Reported on 01/20/2017) 30 tablet 0 Not Taking  . gabapentin (NEURONTIN) 100 MG capsule Take 1 capsule (100 mg total) by mouth 3 (three) times daily. (Patient not taking: Reported on 01/20/2017) 90 capsule 0 Not Taking  . HYDROcodone-acetaminophen (NORCO/VICODIN) 5-325 MG tablet Take 1-2 tablets by mouth every 8 (eight) hours as needed for moderate pain. See instructions to wean narcotics. (Patient not taking: Reported on 01/20/2017) 60 tablet 0 Not Taking  . methocarbamol (ROBAXIN) 500 MG tablet Take 1 tablet (500 mg total) by mouth every 6 (six) hours as needed for muscle spasms. (Patient not taking: Reported on 01/20/2017) 90 tablet 0 Not Taking  . nortriptyline (PAMELOR) 25 MG capsule Take 1 capsule (25 mg total) by mouth at bedtime. (Patient not taking: Reported on 01/20/2017) 30 capsule 0 Not Taking  . pantoprazole (PROTONIX) 40 MG tablet Take 1 tablet (40 mg total) by mouth daily. (Patient not taking: Reported on 01/20/2017) 30 tablet 0 Not Taking  . rivaroxaban (XARELTO) 20 MG TABS tablet Take 1 tablet (20 mg total) by mouth daily with supper. (Patient not taking: Reported on 01/25/2017) 30 tablet 5 Not Taking at Unknown time  . Rivaroxaban 15 & 20 MG TBPK Take as directed on package: Start with one 15mg  tablet by mouth twice a day with food. On Day 22, switch to one 20mg  tablet once a day with food. (Patient not taking: Reported on 01/20/2017) 51 each 0 Not Taking  . senna-docusate (SENOKOT-S) 8.6-50 MG tablet Take 2 tablets by mouth 2 (two) times daily. (Patient not taking: Reported on 01/20/2017) 100 tablet 0 Not Taking  . traMADol (ULTRAM) 50  MG tablet Take 1 tablet (50 mg total) by mouth every 6 (six) hours as needed for moderate pain. (Patient not taking: Reported on 01/20/2017) 90 tablet 0 Not Taking   Scheduled: . atorvastatin  40 mg Oral q1800  . folic acid  1 mg Oral Daily  . LORazepam  0-4 mg  Intravenous Q6H   Followed by  . [START ON 01/27/2017] LORazepam  0-4 mg Intravenous Q12H  . multivitamin with minerals  1 tablet Oral Daily  . nicotine  21 mg Transdermal Daily  . nortriptyline  25 mg Oral QHS  . pantoprazole  40 mg Oral Daily  . rivaroxaban  20 mg Oral Q supper  . sodium chloride flush  3 mL Intravenous Q12H  . thiamine  100 mg Oral Daily   Or  . thiamine  100 mg Intravenous Daily     ROS:                                                                                                                                       As per HPI.   Blood pressure 118/74, pulse 68, temperature 97.7 F (36.5 C), temperature source Oral, resp. rate 20, height 6\' 3"  (1.905 m), weight 107.6 kg (237 lb 4.8 oz), SpO2 97 %.  General Examination:                                                                                                      HEENT-  [place]/AT Lungs- Respirations unlabored Extremities- Warm and well-perfused  Neurological Examination Mental Status: Drowsy and slow to respond to questions, oriented to hospital, month, year however has difficulty getting these answers out at times because he is having difficulty finding the correct words.  Speech hypophonic but fluent with evidence of mild aphasia and word finding difficulties.  Able to follow 3 step commands without difficulty. Cranial Nerves: II: Right hemianopsia which is old  III,IV, VI: ptosis not present, extra-ocular motions intact bilaterally, pupils equal, round, reactive to light and accommodation V,VII: smile slight right facial droop, facial light touch sensation normal bilaterally VIII: hearing normal bilaterally IX,X: palate rises symmetrically XI: bilateral shoulder shrug XII: Midline tongue extension. Mild dysarthria noted.  Motor: Right : Upper extremity   4/5    Left:     Upper extremity   5/5  Lower extremity   4/5     Lower extremity   5/5 Right side has above-knee amputation Normal tone  throughout; no atrophy noted Sensory: Pinprick  and light touch decreased on the right Deep Tendon Reflexes: Depressed throughout Plantars: Right: Right above-knee amputation   Left: downgoing Cerebellar: normal finger-to-nose  Gait: Not tested due to right AKA  Lab Results: Basic Metabolic Panel:  Recent Labs Lab 01/24/17 2220 01/25/17 0327  NA 138 136  K 3.7 3.4*  CL 106 108  CO2 19* 20*  GLUCOSE 155* 111*  BUN 8 9  CREATININE 0.96 0.77  CALCIUM 9.4 8.9    Liver Function Tests:  Recent Labs Lab 01/24/17 2220  AST 26  ALT 23  ALKPHOS 69  BILITOT 0.6  PROT 7.3  ALBUMIN 4.1   No results for input(s): LIPASE, AMYLASE in the last 168 hours. No results for input(s): AMMONIA in the last 168 hours.  CBC:  Recent Labs Lab 01/24/17 2220 01/25/17 0327  WBC 9.7 8.6  NEUTROABS 8.2*  --   HGB 14.7 13.9  HCT 44.8 41.0  MCV 91.2 89.9  PLT 170 157    Cardiac Enzymes:  Recent Labs Lab 01/25/17 0327  CKTOTAL 585*    Lipid Panel: No results for input(s): CHOL, TRIG, HDL, CHOLHDL, VLDL, LDLCALC in the last 168 hours.  CBG:  Recent Labs Lab 01/24/17 2220 01/25/17 0622  GLUCAP 142* 96    Microbiology: Results for orders placed or performed during the hospital encounter of 11/10/16  Surgical PCR screen     Status: None   Collection Time: 11/14/16  1:37 PM  Result Value Ref Range Status   MRSA, PCR NEGATIVE NEGATIVE Final   Staphylococcus aureus NEGATIVE NEGATIVE Final    Comment:        The Xpert SA Assay (FDA approved for NASAL specimens in patients over 70 years of age), is one component of a comprehensive surveillance program.  Test performance has been validated by [facility] for patients greater than or equal to 55 year old. It is not intended to diagnose infection nor to guide or monitor treatment.     Coagulation Studies: No results for input(s): LABPROT, INR in the last 72 hours.  Imaging: Ct Head Wo Contrast  Result Date:  01/24/2017 CLINICAL DATA:  Three seizures this evening. History of stroke last year. EXAM: CT HEAD WITHOUT CONTRAST TECHNIQUE: Contiguous axial images were obtained from the base of the skull through the vertex without intravenous contrast. COMPARISON:  05/22/2016 head CT and MRI. FINDINGS: Brain: Chronic left temporal and occipital lobe encephalomalacia from chronic left PCA distribution infarct. Chronic lacunar infarct of the left thalamus. No acute intracranial hemorrhage, new large vascular territory infarction, intra-axial mass nor extra-axial fluid. No midline shift. Chronic small vessel ischemic disease of periventricular white white matter and centrum semiovale. Vascular: Moderate atherosclerosis of the carotid siphons. No hyperdense vessels. Skull: Negative for fracture or focal lesion. Sinuses/Orbits: No acute finding. Other: None IMPRESSION: 1. Chronic left PCA distribution infarcts again noted without significant change. 2. Chronic lacunar infarcts of the left thalamus. 3. Chronic small vessel ischemic disease of periventricular white matter. 4. No acute intracranial abnormality. Electronically Signed   By: Tollie Eth M.D.   On: 01/24/2017 22:23   Dg Chest Portable 1 View  Result Date: 01/24/2017 CLINICAL DATA:  Acute onset of seizure like activity. High blood pressure. Initial encounter. EXAM: PORTABLE CHEST 1 VIEW COMPARISON:  Chest radiograph performed 11/16/2016 FINDINGS: The lungs are well-aerated. Mild vascular congestion is noted. Increased interstitial markings may reflect mild interstitial edema. There is no evidence of pleural effusion or pneumothorax. The cardiomediastinal silhouette is within normal limits.  No acute osseous abnormalities are seen. IMPRESSION: Mild vascular congestion. Increased interstitial markings may reflect mild interstitial edema. Electronically Signed   By: Roanna RaiderJeffery  Chang M.D.   On: 01/24/2017 23:08   History and examination documented by Felicie Mornavid Smith PA-C, [facility], [phone] 01/25/2017, 11:17 AM   Assessment/Plan:  56 year old male with history of left PCA infarcts, presenting with three new onset seizures.  1. Most likely epileptogenic focus is the old stroke.  2. The patient has been loaded with Keppra and has been started on Keppra 500 mg twice a day with no further seizure activity. At this time would recommend keeping him on Keppra and we'll watch him for another 24 hours to see if the Keppra is causing drowsiness or if it is just postictal.  3. MRI has been ordered.  4. EEG in AM on Wednesday.  5. History of left PCA territory strokes in June of 2017 with right UE/LE weakness. CT today also shows chronic lacunar infarcts of the left thalamus and left crus cerebri, the latter finding explaining his spastic right hemiparesis. 6. Prescribed Xarelto for DVT at right AKA amputation site.  7. Prescribed with Neurontin at home. Unclear if this is for seizure prophylaxis or neuropathy.  Has phantom limb pain listed as a diagnosis on an old D/C summary. Also has a diagnosis of DM2 with diabetic peripheral neuropathy on the old D/C summary.  8. Discontinue Ultram, as this medication can lower the seizure threshold.  9. Possible medication noncompliance. Would discuss with his sister, whom he states will be visiting him in the hospital.  10. Has had loop recorder in the past (as of 10/31/2016 D/C summary) and unclear if this is still present. No atrial fibrillation seen on today's EKG.   Electronically signed: Dr. Caryl PinaEric Toneisha Savary

## 2017-01-25 NOTE — Procedures (Signed)
HPI:  56 y/o with seizure   TECHNICAL SUMMARY:  A multichannel referential and bipolar montage EEG using the standard international 10-20 system was performed on the patient described as awake and drowsy.  The dominant background activity consists of 10 hertz activity seen most prominantly over the posterior head region.  5-7 Hz activity can be seen intermixed on the left.  The backgound activity is reactive to eye opening and closing procedures.  Low voltage fast (beta) activity is distributed symmetrically and maximally over the anterior head regions.  ACTIVATION:  Stepwise photic stimulation and hyperventilation is not performed.  EPILEPTIFORM ACTIVITY:  There were no spikes, sharp waves or paroxysmal activity.  SLEEP:  Stage I and stage II sleep architecture is identified.   IMPRESSION:  This is an abnormal EEG demonstrating mild focal slowing on the left hemisphere compared to that of the right.  If not already done so, a structural lesion should be ruled out.  There was no epileptiform activity recorded on this tracing.  This does not rule out a seizure disorder and if seizure remains high on the list of differential diagnoses, an ambulatory EEG may be of value.  Correlate clinically.

## 2017-01-25 NOTE — Progress Notes (Signed)
SLP Cancellation Note  Patient Details Name: Ronald Forbes MRN: [id number] DOB: November 16, 1960   Cancelled treatment:       Reason Eval/Treat Not Completed: SLP screened, no needs identified, will sign off. Order received for swallow evaluation - RN reports just completing the RN stroke swallow screen and pt passed. She does not report any concerns re: swallowing and he is already now on a diet of regular textures and thin liquids. SLP will defer evaluation for now.   Ronald Forbes, Ronald Forbes 01/25/2017, 11:01 AM  Ronald HamLaura Forbes, M.A. CCC-SLP [phone]

## 2017-01-25 NOTE — H&P (Signed)
History and Physical    Ronald Forbes [facility] ZOX:[id number] DOB: Nov 11, 1960 DOA: 01/24/2017  Referring MD/NP/PA:   PCP: Loletta Specter, PA-C   Patient coming from:  The patient is coming from home.  At baseline, pt is independent for most of ADL.     Chief Complaint: Seizure  HPI: Benjamyn Hestand is a 56 y.o. male with medical history significant of medication noncompliance, hyperlipidemia, diabetes mellitus, GERD, depression, tobacco abuse, stroke, seizure, obesity, alcohol abuse, CAD, s/p of right BKA, DVT, who presents with seizure.  Patient is in post ictal status. He is unable to provide accurate medical history, therefore, most of the history is obtained by discussing the case with ED physician, per EMS report, and with the nursing staff. Per report, pt has had 3 episodes of seizure at home. The last one was at about 8:30 PM, which lasted for 5-8 minutes. When I saw pt in ED, he is in post ictal status. He shaked his head when asked if he has any chest pain, SOB, diarrhea or abdominal pain. He moves all extremities  ED Course: pt was found to have WBC 9.7, TSH 0.010, alcohol level less than 5, creatinine normal, bicarbonate 19, and 13, temperature normal, oxygen saturation 94% on room air, CT head is negative for acute intracranial abnormalities, but showed chronic left PCA distribution infarcts and chronic lacunar infarcts of the left thalamus. Pt is placed on tele bed for obs. Neurology, Dr. Roseanne Reno was consulted by EDP.   Review of Systems: Could not be reviewed due to postictal status.   Allergy: No Known Allergies  Past Medical History:  Diagnosis Date  . Alcohol use disorder (HCC)   . Cerebral vascular accident (HCC)   . 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 Diabetes mellitus type 2 in obese (HCC)   . HLD (hyperlipidemia)   . Morbid obesity (HCC)   . Seizures (HCC)   . Stroke (HCC)   . Tobacco abuse     Past Surgical  History:  Procedure Laterality Date  . ABDOMINAL AORTOGRAM W/LOWER EXTREMITY Right 10/20/2016   Procedure: Abdominal Aortogram w/Lower Extremity;  Surgeon: Maeola Harman, MD;  Location: [facility] INVASIVE CV LAB;  Service: Cardiovascular;  Laterality: Right;  lower leg  . AMPUTATION Right 11/15/2016   Procedure: AMPUTATION ABOVE KNEE;  Surgeon: Sherren Kerns, MD;  Location: [facility] OR;  Service: Vascular;  Laterality: Right;  . EP IMPLANTABLE DEVICE N/A 02/09/2016   Procedure: Loop Recorder Insertion;  Surgeon: Duke Salvia, MD;  Location: [facility] INVASIVE CV LAB;  Service: Cardiovascular;  Laterality: N/A;  . FEMORAL-TIBIAL BYPASS GRAFT Right 10/25/2016   Procedure: BYPASS GRAFT FEMORAL-TIBIAL ARTERY USING NON REVERSED SAPPHENOUS VEIN;  Surgeon: Sherren Kerns, MD;  Location: [facility] OR;  Service: Vascular;  Laterality: Right;  . INTRAOPERATIVE ARTERIOGRAM Right 10/25/2016   Procedure: INTRA OPERATIVE ARTERIOGRAM;  Surgeon: Sherren Kerns, MD;  Location: [facility] OR;  Service: Vascular;  Laterality: Right;  . TEE WITHOUT CARDIOVERSION N/A 02/06/2016   Procedure: TRANSESOPHAGEAL ECHOCARDIOGRAM (TEE);  Surgeon: Wendall Stade, MD;  Location: [facility] ENDOSCOPY;  Service: Cardiovascular;  Laterality: N/A;    Social History:  reports that he has been smoking Cigarettes.  He has a 40.00 pack-year smoking history. He has never used smokeless tobacco. He reports that he drinks alcohol. He reports that he uses drugs, including Marijuana.  Family History:  Family History  Problem Relation Age of Onset  . Hypertension Mother  Prior to Admission medications   Medication Sig Start Date End Date Taking? Authorizing Provider  atorvastatin (LIPITOR) 40 MG tablet Take 1 tablet (40 mg total) by mouth daily at 6 PM. Patient not taking: Reported on 01/20/2017 11/23/16   Love, Evlyn KannerPamela S, PA-C  gabapentin (NEURONTIN) 100 MG capsule Take 1 capsule (100 mg total) by mouth 3 (three) times daily. Patient not taking: Reported on  01/20/2017 11/24/16   Love, Evlyn KannerPamela S, PA-C  HYDROcodone-acetaminophen (NORCO/VICODIN) 5-325 MG tablet Take 1-2 tablets by mouth every 8 (eight) hours as needed for moderate pain. See instructions to wean narcotics. Patient not taking: Reported on 01/20/2017 12/07/16   Erick ColaceKirsteins, Andrew E, MD  methocarbamol (ROBAXIN) 500 MG tablet Take 1 tablet (500 mg total) by mouth every 6 (six) hours as needed for muscle spasms. Patient not taking: Reported on 01/20/2017 11/24/16   Love, Evlyn KannerPamela S, PA-C  nortriptyline (PAMELOR) 25 MG capsule Take 1 capsule (25 mg total) by mouth at bedtime. Patient not taking: Reported on 01/20/2017 11/24/16   Love, Evlyn KannerPamela S, PA-C  pantoprazole (PROTONIX) 40 MG tablet Take 1 tablet (40 mg total) by mouth daily. Patient not taking: Reported on 01/20/2017 11/24/16   Love, Evlyn KannerPamela S, PA-C  rivaroxaban (XARELTO) 20 MG TABS tablet Take 1 tablet (20 mg total) by mouth daily with supper. Patient not taking: Reported on 01/25/2017 01/06/17   Loletta SpecterGomez, Roger David, PA-C  Rivaroxaban 15 & 20 MG TBPK Take as directed on package: Start with one 15mg  tablet by mouth twice a day with food. On Day 22, switch to one 20mg  tablet once a day with food. Patient not taking: Reported on 01/20/2017 12/21/16   Linwood DibblesKnapp, Jon, MD  senna-docusate (SENOKOT-S) 8.6-50 MG tablet Take 2 tablets by mouth 2 (two) times daily. Patient not taking: Reported on 01/20/2017 11/23/16   Love, Evlyn KannerPamela S, PA-C  traMADol (ULTRAM) 50 MG tablet Take 1 tablet (50 mg total) by mouth every 6 (six) hours as needed for moderate pain. Patient not taking: Reported on 01/20/2017 11/24/16   Jerene PitchLove, Pamela S, PA-C    Physical Exam: Vitals:   01/24/17 2330 01/25/17 0000 01/25/17 0130 01/25/17 0300  BP: 129/85 128/86 129/77 122/85  Pulse: 90 89    Resp: (!) 21  (!) 23 18  Temp:      TempSrc:      SpO2: 94%  94%    General: Not in acute distress HEENT:       Eyes: PERRL, EOMI, no scleral icterus.       ENT: No discharge from the ears and nose       Neck: No  JVD, no bruit, no mass felt. Heme: No neck lymph node enlargement. Cardiac: S1/S2, RRR, No murmurs, No gallops or rubs. Respiratory: No rales, wheezing, rhonchi or rubs. GI: Soft, nondistended, nontender, no organomegaly, BS present. GU: No hematuria Ext: No pitting leg edema bilaterally. 2+DP/PT in left leg. S/p of R BKA.  Musculoskeletal: No joint deformities, No joint redness or warmth, no limitation of ROM in spin. Skin: No rashes.  Neuro: pt is in post ictal status, cranial nerves II-XII grossly intact, moves all extremities. Psych: Patient is not psychotic, no suicidal or hemocidal ideation.  Labs on Admission: I have personally reviewed following labs and imaging studies  CBC:  Recent Labs Lab 01/24/17 2220 01/25/17 0327  WBC 9.7 8.6  NEUTROABS 8.2*  --   HGB 14.7 13.9  HCT 44.8 41.0  MCV 91.2 89.9  PLT 170 157   Basic  Metabolic Panel:  Recent Labs Lab 01/24/17 2220  NA 138  K 3.7  CL 106  CO2 19*  GLUCOSE 155*  BUN 8  CREATININE 0.96  CALCIUM 9.4   GFR: Estimated Creatinine Clearance: 113.6 mL/min (by C-G formula based on SCr of 0.96 mg/dL). Liver Function Tests:  Recent Labs Lab 01/24/17 2220  AST 26  ALT 23  ALKPHOS 69  BILITOT 0.6  PROT 7.3  ALBUMIN 4.1   No results for input(s): LIPASE, AMYLASE in the last 168 hours. No results for input(s): AMMONIA in the last 168 hours. Coagulation Profile: No results for input(s): INR, PROTIME in the last 168 hours. Cardiac Enzymes: No results for input(s): CKTOTAL, CKMB, CKMBINDEX, TROPONINI in the last 168 hours. BNP (last 3 results) No results for input(s): PROBNP in the last 8760 hours. HbA1C: No results for input(s): HGBA1C in the last 72 hours. CBG:  Recent Labs Lab 01/24/17 2220  GLUCAP 142*   Lipid Profile: No results for input(s): CHOL, HDL, LDLCALC, TRIG, CHOLHDL, LDLDIRECT in the last 72 hours. Thyroid Function Tests:  Recent Labs  01/25/17 0021 01/25/17 0225  TSH 0.010*  --     FREET4  --  1.00   Anemia Panel: No results for input(s): VITAMINB12, FOLATE, FERRITIN, TIBC, IRON, RETICCTPCT in the last 72 hours. Urine analysis:    Component Value Date/Time   COLORURINE YELLOW 05/21/2016 2120   APPEARANCEUR CLEAR 05/21/2016 2120   LABSPEC >1.030 (H) 05/21/2016 2120   PHURINE 6.0 05/21/2016 2120   GLUCOSEU NEGATIVE 05/21/2016 2120   HGBUR NEGATIVE 05/21/2016 2120   BILIRUBINUR NEGATIVE 05/21/2016 2120   KETONESUR NEGATIVE 05/21/2016 2120   PROTEINUR 30 (A) 05/21/2016 2120   NITRITE NEGATIVE 05/21/2016 2120   LEUKOCYTESUR TRACE (A) 05/21/2016 2120   Sepsis Labs: @LABRCNTIP (procalcitonin:4,lacticidven:4) )No results found for this or any previous visit (from the past 240 hour(s)).   Radiological Exams on Admission: Ct Head Wo Contrast  Result Date: 01/24/2017 CLINICAL DATA:  Three seizures this evening. History of stroke last year. EXAM: CT HEAD WITHOUT CONTRAST TECHNIQUE: Contiguous axial images were obtained from the base of the skull through the vertex without intravenous contrast. COMPARISON:  05/22/2016 head CT and MRI. FINDINGS: Brain: Chronic left temporal and occipital lobe encephalomalacia from chronic left PCA distribution infarct. Chronic lacunar infarct of the left thalamus. No acute intracranial hemorrhage, new large vascular territory infarction, intra-axial mass nor extra-axial fluid. No midline shift. Chronic small vessel ischemic disease of periventricular white white matter and centrum semiovale. Vascular: Moderate atherosclerosis of the carotid siphons. No hyperdense vessels. Skull: Negative for fracture or focal lesion. Sinuses/Orbits: No acute finding. Other: None IMPRESSION: 1. Chronic left PCA distribution infarcts again noted without significant change. 2. Chronic lacunar infarcts of the left thalamus. 3. Chronic small vessel ischemic disease of periventricular white matter. 4. No acute intracranial abnormality. Electronically Signed   By: Tollie Eth M.D.   On: 01/24/2017 22:23   Dg Chest Portable 1 View  Result Date: 01/24/2017 CLINICAL DATA:  Acute onset of seizure like activity. High blood pressure. Initial encounter. EXAM: PORTABLE CHEST 1 VIEW COMPARISON:  Chest radiograph performed 11/16/2016 FINDINGS: The lungs are well-aerated. Mild vascular congestion is noted. Increased interstitial markings may reflect mild interstitial edema. There is no evidence of pleural effusion or pneumothorax. The cardiomediastinal silhouette is within normal limits. No acute osseous abnormalities are seen. IMPRESSION: Mild vascular congestion. Increased interstitial markings may reflect mild interstitial edema. Electronically Signed   By: Beryle Beams.D.  On: 01/24/2017 23:08     EKG: Independently reviewed. Sinus rhythm, QTC 457, early R-wave progression, nonspecific T-wave change.     Assessment/Plan Principal Problem:   Seizure (HCC) Active Problems:   Tobacco abuse   Alcohol use disorder (HCC)   CVA (cerebral vascular accident) (HCC)   HLD (hyperlipidemia)   Diabetes mellitus type 2 in obese (HCC)   Benign essential HTN   Depression   DVT (deep venous thrombosis) (HCC)   Seizure (HCC): Etiology is not clear. CT head is negative for acute intracranial abnormalities, but showed chronic left PCA distribution infarcts and chronic lacunar infarcts of the left thalamus. Neurology, Dr. Roseanne Reno was consulted by EDP, recommended to get MRI of brain, but pt has hx of loop recorder placement 02/09/16, not sure if his loop recorder was removed oo not. We'll hold off MRI now.  -will place on tele bed for obs -pt was loaded with 1 g of Keppra, will continue 500 mg bid  Now -prn Ativan -Seizure precaution -hold tramadol  Hx CVA: pt is not taking any home meds -restart Lipitor -pt is on xarelto of DVT  Hx of DVT: not taking Xarelto -resume Xarelto -consult to CM and SW  HLD: -lipitor  Tobacco abuse and Alcohol abuse: -Nicotine  patch -CIWA protocol  GERD: -Protonix  Depression: -resume nortriptyline  HTN: Bp 129/77. Not on bp meds at home -monitoring Bp closely  DM-II: Last A1c 5.6 on 01/06/17, well controled. Patient is not taking meds at home -SSI  Abnormal TSH: TSH is 0.010 in ED -f/u Free T3 and T4 which were ordered by EDP.  Metabolic acidosis: Bicarbonate 19, possibly due to seizure induced lactic acid production -Check lactic acid level and CK -IVF: 1L NS and then 75 cc/h   DVT ppx: on Xarelto Code Status: Full code Family Communication: None at bed side.     Disposition Plan:  Anticipate discharge back to previous home environment Consults called: Neurology, Dr. Roseanne Reno was consulted by EDP.  Admission status: Obs / tele    Date of Service 01/25/2017    Lorretta Harp [facility] Pager [phone]  If 7PM-7AM, please contact night-coverage [email or web address] Password [facility] 01/25/2017, 3:51 AM

## 2017-01-25 NOTE — Progress Notes (Signed)
01/24/2017  9:23 PM  01/25/2017 10:55 AM  Lindwood CokeKevin Van Dunk was seen and examined.  The H&P by the admitting provider, orders, imaging was reviewed.  Please see new orders.  Will continue to follow.  I called and requested neurology consult.  I called MRI and verified that patient will be able to have MRI with implanted loop recorder.  Will order EEG.   Maryln Manuel. Johnson, MD [facility]

## 2017-01-25 NOTE — ED Notes (Signed)
Attempted to call report to 5M x1 °

## 2017-01-25 NOTE — Care Management Note (Signed)
Case Management Note  Patient Details  Name: Ronald Forbes MRN: [id number] Date of Birth: 09/08/1960  Subjective/Objective:   Patient in with seizures. He is from home with his sister Ronald Pih(Jackie). Per patient she is able to provide 24/7 supervision. He states he has walker, wheelchair, and shower seat at home. CM consulted for medication assistance. Per chart patient has Medicaid. Pt was given appointment with his PCP after d/c from [facility] which patient never went to. [facility] but per the clinic he has not been seen since 2016.                 Action/Plan: CM unable to assist with co pays of patients medications after covered by his Medicaid. His co pays should each be in the $3-4 range. CM attempted to call patients sister Ronald PihJackie to see what issues the patient was having obtaining his medications and to see what needs to be arranged for hospital f/u.  [place]. CM continuing to follow.  Expected Discharge Date:                  Expected Discharge Plan:  Home w Home Health Services  In-House Referral:     Discharge planning Services  CM Consult  Post Acute Care Choice:    Choice offered to:     DME Arranged:    DME Agency:     HH Arranged:    HH Agency:     Status of Service:  In process, will continue to follow  If discussed at Long Length of Stay Meetings, dates discussed:    Additional Comments:  Kermit BaloKelli F Tashima Scarpulla, RN 01/25/2017, 2:05 PM

## 2017-01-25 NOTE — Progress Notes (Signed)
Pt admitted from ED with the diagnosis of seizures at home, pt drowsy but easy to arouse, denies any pain, pt settled in bed with call light at bedside, pt reassured, will continue to monitor. Obasogie-Asidi, Ronald Forbes

## 2017-01-26 DIAGNOSIS — I824Y9 Acute embolism and thrombosis of unspecified deep veins of unspecified proximal lower extremity: Secondary | ICD-10-CM

## 2017-01-26 DIAGNOSIS — I63332 Cerebral infarction due to thrombosis of left posterior cerebral artery: Secondary | ICD-10-CM

## 2017-01-26 DIAGNOSIS — F329 Major depressive disorder, single episode, unspecified: Secondary | ICD-10-CM

## 2017-01-26 LAB — GLUCOSE, CAPILLARY: Glucose-Capillary: 97 mg/dL (ref 65–99)

## 2017-01-26 LAB — COMPREHENSIVE METABOLIC PANEL
ALT: 20 U/L (ref 17–63)
AST: 17 U/L (ref 15–41)
Albumin: 3.3 g/dL — ABNORMAL LOW (ref 3.5–5.0)
Alkaline Phosphatase: 61 U/L (ref 38–126)
Anion gap: 6 (ref 5–15)
BUN: 9 mg/dL (ref 6–20)
CHLORIDE: 111 mmol/L (ref 101–111)
CO2: 21 mmol/L — AB (ref 22–32)
CREATININE: 0.87 mg/dL (ref 0.61–1.24)
Calcium: 8.1 mg/dL — ABNORMAL LOW (ref 8.9–10.3)
GFR calc Af Amer: >60 mL/min (ref >60)
GLUCOSE: 99 mg/dL (ref 65–99)
POTASSIUM: 3.3 mmol/L — AB (ref 3.5–5.1)
SODIUM: 138 mmol/L (ref 135–145)
Total Bilirubin: 0.6 mg/dL (ref 0.3–1.2)
Total Protein: 5.8 g/dL — ABNORMAL LOW (ref 6.5–8.1)

## 2017-01-26 LAB — CBC WITH DIFFERENTIAL/PLATELET
BASOS ABS: 0 10*3/uL (ref 0.0–0.1)
BASOS PCT: 0 %
EOS PCT: 1 %
Eosinophils Absolute: 0.1 10*3/uL (ref 0.0–0.7)
HCT: 38.8 % — ABNORMAL LOW (ref 39.0–52.0)
Hemoglobin: 12.7 g/dL — ABNORMAL LOW (ref 13.0–17.0)
LYMPHS PCT: 36 %
Lymphs Abs: 2.1 10*3/uL (ref 0.7–4.0)
MCH: 29.5 pg (ref 26.0–34.0)
MCHC: 32.7 g/dL (ref 30.0–36.0)
MCV: 90 fL (ref 78.0–100.0)
MONO ABS: 0.4 10*3/uL (ref 0.1–1.0)
Monocytes Relative: 6 %
Neutro Abs: 3.4 10*3/uL (ref 1.7–7.7)
Neutrophils Relative %: 57 %
PLATELETS: 142 10*3/uL — AB (ref 150–400)
RBC: 4.31 MIL/uL (ref 4.22–5.81)
RDW: 14.4 % (ref 11.5–15.5)
WBC: 5.9 10*3/uL (ref 4.0–10.5)

## 2017-01-26 LAB — CK: CK TOTAL: 730 U/L — AB (ref 49–397)

## 2017-01-26 LAB — T3, FREE: T3 FREE: 3.7 pg/mL (ref 2.0–4.4)

## 2017-01-26 MED ORDER — METHOCARBAMOL 500 MG PO TABS: 500.0000 mg | ORAL_TABLET | Freq: Three times a day (TID) | ORAL | 0 refills | Status: DC | PRN

## 2017-01-26 MED ORDER — PANTOPRAZOLE SODIUM 40 MG PO TBEC: 40.0000 mg | DELAYED_RELEASE_TABLET | Freq: Every day | ORAL | 1 refills | Status: DC

## 2017-01-26 MED ORDER — RIVAROXABAN 20 MG PO TABS: 20.0000 mg | ORAL_TABLET | Freq: Every day | ORAL | 3 refills | Status: DC

## 2017-01-26 MED ORDER — ACETAMINOPHEN 325 MG PO TABS: 650.0000 mg | ORAL_TABLET | Freq: Four times a day (QID) | ORAL | 0 refills | Status: DC | PRN

## 2017-01-26 MED ORDER — LEVETIRACETAM 500 MG PO TABS
500.0000 mg | ORAL_TABLET | Freq: Two times a day (BID) | ORAL | Status: DC
  Administered 2017-01-26: 500 mg via ORAL
  Filled 2017-01-26: qty 1

## 2017-01-26 MED ORDER — NORTRIPTYLINE HCL 25 MG PO CAPS: 25.0000 mg | ORAL_CAPSULE | Freq: Every day | ORAL | 1 refills | Status: DC

## 2017-01-26 MED ORDER — NICOTINE 21 MG/24HR TD PT24: 21.0000 mg | MEDICATED_PATCH | Freq: Every day | TRANSDERMAL | 0 refills | Status: DC

## 2017-01-26 MED ORDER — SENNOSIDES-DOCUSATE SODIUM 8.6-50 MG PO TABS: 2.0000 | ORAL_TABLET | Freq: Every evening | ORAL | 0 refills | Status: DC | PRN

## 2017-01-26 MED ORDER — GABAPENTIN 100 MG PO CAPS: 100.0000 mg | ORAL_CAPSULE | Freq: Three times a day (TID) | ORAL | 1 refills | Status: DC

## 2017-01-26 MED ORDER — LEVETIRACETAM 500 MG PO TABS: 500.0000 mg | ORAL_TABLET | Freq: Two times a day (BID) | ORAL | 1 refills | Status: DC

## 2017-01-26 MED ORDER — ATORVASTATIN CALCIUM 40 MG PO TABS: 40.0000 mg | ORAL_TABLET | Freq: Every day | ORAL | 1 refills | Status: DC

## 2017-01-26 NOTE — Discharge Summary (Signed)
Physician Discharge Summary  [facility] ZOX:[id number] DOB: [birth year] DOA: 01/24/2017  PCP: Loletta SpecterGomez, Roger David, PA-C  Admit date: 01/24/2017 Discharge date: 01/26/2017  Time spent: 35 minutes  Recommendations for Outpatient Follow-up:  1. Repeat basic metabolic panel to follow electrolytes and renal function 2. Reassess blood pressure and start antihypertensive regimen if needed 3. Follow-up patient compliance with medication 4. Make sure patient follow up as instructed with neurology   Discharge Diagnoses:  Principal Problem:   Seizure [facility](HCC) Active Problems:   Tobacco abuse   Alcohol use disorder (HCC)   CVA (cerebral vascular accident) (HCC)   HLD (hyperlipidemia)   Diabetes mellitus type 2 in obese (HCC)   Benign essential HTN   Depression   DVT (deep venous thrombosis) (HCC)   Seizures (HCC)   Discharge Condition:Stable and improved. Patient has been discharged home with instruction to follow up with PCP and with neurology in 6 weeks. Case manager has made appointment and provided information to assist with his medication. Home health RN has been requested to follow patient health status and medication compliance.   Diet recommendation: Heart healthy diet  Filed Weights   01/25/17 0550  Weight: 107.6 kg (237 lb 4.8 oz)    History of present illness:  As per H&P written by Dr. Clyde LundborgNiu on 01/25/17 56 y.o. male with medical history significant of medication noncompliance, hyperlipidemia, diabetes mellitus, GERD, depression, tobacco abuse, stroke, seizure, obesity, alcohol abuse, CAD, s/p of right BKA, DVT, who presents with seizure.  Patient is in post ictal status. He is unable to provide accurate medical history, therefore, most of the history is obtained by discussing the case with ED physician, per EMS report, and with the nursing staff. Per report, pt has had 3 episodes of seizure at home. The last one was at about 8:30 PM, which lasted for 5-8 minutes. When I saw pt in  ED, he is in post ictal status. He shaked his head when asked if he has any chest pain, SOB, diarrhea or abdominal pain. He moves all extremities  ED Course: pt was found to have WBC 9.7, TSH 0.010, alcohol level less than 5, creatinine normal, bicarbonate 19, and 13, temperature normal, oxygen saturation 94% on room air, CT head is negative for acute intracranial abnormalities, but showed chronic left PCA distribution infarcts and chronic lacunar infarcts of the left thalamus. Pt is placed on tele bed for obs. Neurology, Dr. Roseanne [facility]Stewart [facility]  Hospital Course:   1-seizure activity: Secondary to medication noncompliance. -Patient CT/MRI demonstrated chronic left PCA distribution infarcts and chronic lacunar infarcts of the left thalamus; which could explain source of focal epilepsies. -Patient reports no having morning to be compliant with his medication so he has not been taking anything for the last couple month. -Good response to the use of Keppra; medication well tolerated and no further seizure activity appreciated during this hospitalization. -Patient received prescription for his medications at discharge and case manager was made aware in order to provide information/resource nurse to assist in acquiring his medication. -Will require outpatient follow-up with primary care physician and also with neurology service for further adjustment on his AEDs as needed.  2-alcohol/tobacco abuse -Cessation counseling has been provided and absolute abstinence has been encouraged -Patient reports no longer smoking or drinking given the fact that he doesn't have money to buy these recreational substances.  3-history of CVA -No new stroke appreciated -Will resume statins and also xarelto  4-history of DVT -will resume xarelto  5-HLD -will resume statins  6-GERD -will continue protonix  7-Depression -no SI or hallucinations -resume Pamelor  8-abnormal TSH -with normal T3 and  T4 -will need follow up on his thyroid profile as an outpatient .  9-neuropathy/phantom pain  -Continue Neurontin and Tylenol as needed   10-hypertension -Currently not taking any antihypertensive medication -Blood pressure overall well controlled during hospitalization -Patient instructed to follow a heart healthy diet   Procedures:  See below for x-ray reports   EEG: This is an abnormal EEG demonstrating mild focal slowing on the left hemisphere compared to that of the right.  If not already done so, a structural lesion should be ruled out.  There was no epileptiform activity recorded on this tracing.  Consultations:  Neurology   Discharge Exam: Vitals:   01/26/17 0945 01/26/17 1338  BP: 116/75 127/82  Pulse: 65 61  Resp: 20 20  Temp: 98.6 F (37 C) 98.3 F (36.8 C)    General: Afebrile, denies chest pain, no shortness of breath. No further seizure activity appreciated.  Cardiovascular: S1 and S2, no rubs, no gallops Respiratory: good air movement bilaterally, no wheezing,no crackles abdomen: Soft, nontender, nondistended, positive bowel sounds. Extremities: Right BKA; no edema, no cyanosis, no clubbing. 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  Discharge Instructions   Discharge Instructions    Ambulatory referral to Neurology    Complete by:  As directed    An appointment is requested in approximately: 3 weeks; for follow up and further adjustment on AED's therapy   Diet - low sodium heart healthy    Complete by:  As directed    Discharge instructions    Complete by:  As directed    Take medications as prescribed Please arrange follow up with PCP Maintain adequate hydration Keep yourself alcohol and smoking free.   Increase activity slowly    Complete by:  As directed      Current Discharge Medication List    START taking these medications   Details  acetaminophen (TYLENOL) 325 MG tablet Take 2 tablets (650 mg total) by  mouth every 6 (six) hours as needed for mild pain (or Fever >/= 101). Qty: 40 tablet, Refills: 0    levETIRAcetam (KEPPRA) 500 MG tablet Take 1 tablet (500 mg total) by mouth 2 (two) times daily. Qty: 60 tablet, Refills: 1    nicotine (NICODERM CQ - DOSED IN MG/24 HOURS) 21 mg/24hr patch Place 1 patch (21 mg total) onto the skin daily. Qty: 28 patch, Refills: 0      CONTINUE these medications which have CHANGED   Details  atorvastatin (LIPITOR) 40 MG tablet Take 1 tablet (40 mg total) by mouth daily at 6 PM. Qty: 30 tablet, Refills: 1    gabapentin (NEURONTIN) 100 MG capsule Take 1 capsule (100 mg total) by mouth 3 (three) times daily. Qty: 90 capsule, Refills: 1    methocarbamol (ROBAXIN) 500 MG tablet Take 1 tablet (500 mg total) by mouth every 8 (eight) hours as needed for muscle spasms. Qty: 40 tablet, Refills: 0    nortriptyline (PAMELOR) 25 MG capsule Take 1 capsule (25 mg total) by mouth at bedtime. Qty: 30 capsule, Refills: 1    pantoprazole (PROTONIX) 40 MG tablet Take 1 tablet (40 mg total) by mouth daily. Qty: 30 tablet, Refills: 1    rivaroxaban (XARELTO) 20 MG TABS tablet Take 1 tablet (20 mg total) by mouth daily with supper. Qty: 30 tablet, Refills: 3  senna-docusate (SENOKOT-S) 8.6-50 MG tablet Take 2 tablets by mouth at bedtime as needed for mild constipation. Qty: 60 tablet, Refills: 0      STOP taking these medications     HYDROcodone-acetaminophen (NORCO/VICODIN) 5-325 MG tablet      Rivaroxaban 15 & 20 MG TBPK      traMADol (ULTRAM) 50 MG tablet        No Known Allergies Follow-up Information    [facility] Follow up.   Specialty:  Family Medicine Why:  Call to schedule an appoinetment with Dr Lily Kocher. Contact information: [address] [place] [facility] [id number] [phone]          The results of significant diagnostics from this hospitalization (including imaging, microbiology, ancillary and  laboratory) are listed below for reference.    Significant Diagnostic Studies: Ct Head Wo Contrast  Result Date: 01/24/2017 CLINICAL DATA:  Three seizures this evening. History of stroke last year. EXAM: CT HEAD WITHOUT CONTRAST TECHNIQUE: Contiguous axial images were obtained from the base of the skull through the vertex without intravenous contrast. COMPARISON:  05/22/2016 head CT and MRI. FINDINGS: Brain: Chronic left temporal and occipital lobe encephalomalacia from chronic left PCA distribution infarct. Chronic lacunar infarct of the left thalamus. No acute intracranial hemorrhage, new large vascular territory infarction, intra-axial mass nor extra-axial fluid. No midline shift. Chronic small vessel ischemic disease of periventricular white white matter and centrum semiovale. Vascular: Moderate atherosclerosis of the carotid siphons. No hyperdense vessels. Skull: Negative for fracture or focal lesion. Sinuses/Orbits: No acute finding. Other: None IMPRESSION: 1. Chronic left PCA distribution infarcts again noted without significant change. 2. Chronic lacunar infarcts of the left thalamus. 3. Chronic small vessel ischemic disease of periventricular white matter. 4. No acute intracranial abnormality. Electronically Signed   By: Tollie Eth M.D.   On: 01/24/2017 22:23   Mr Brain Wo Contrast  Result Date: 01/25/2017 CLINICAL DATA:  Seizures EXAM: MRI HEAD WITHOUT CONTRAST TECHNIQUE: Multiplanar, multiecho pulse sequences of the brain and surrounding structures were obtained without intravenous contrast. COMPARISON:  Brain MRI 05/22/2016 FINDINGS: Brain: The midline structures are normal. There is multifocal diffusion restriction within the area of encephalomalacia in the left PCA territory. No new areas of diffusion restriction. There is periventricular T2 hyperintensity is well as hyperintensity in the white matter surrounding the area of remote left PCA infarct. No acute hemorrhage. There is hemosiderin  deposition in the territory of the prior infarct. There is atrophy of the medial left temporal lobe secondary to prior infarct. No intraparenchymal hematoma or chronic microhemorrhage. Brain volume is normal for age without age-advanced or lobar predominant atrophy. The dura is normal and there is no extra-axial collection. Vascular: Major intracranial arterial and venous sinus flow voids are preserved. Skull and upper cervical spine: The visualized skull base, calvarium, upper cervical spine and extracranial soft tissues are normal. Sinuses/Orbits: No fluid levels or advanced mucosal thickening. No mastoid effusion. Normal orbits. IMPRESSION: 1. Remote left PCA territory infarct with extensive surrounding encephalomalacia that also involves the medial left temporal lobe. This area of encephalomalacia could serve as a seizure focus. 2. Areas of diffusion restriction within the infarcted territory are similar to the prior study and likely related to susceptibility effects or other technical factors. 3. No acute intracranial abnormality. Electronically Signed   By: Deatra Robinson M.D.   On: 01/25/2017 19:38   Dg Chest Portable 1 View  Result Date: 01/24/2017 CLINICAL DATA:  Acute onset of seizure like activity.  High blood pressure. Initial encounter. EXAM: PORTABLE CHEST 1 VIEW COMPARISON:  Chest radiograph performed 11/16/2016 FINDINGS: The lungs are well-aerated. Mild vascular congestion is noted. Increased interstitial markings may reflect mild interstitial edema. There is no evidence of pleural effusion or pneumothorax. The cardiomediastinal silhouette is within normal limits. No acute osseous abnormalities are seen. IMPRESSION: Mild vascular congestion. Increased interstitial markings may reflect mild interstitial edema. Electronically Signed   By: Roanna Raider M.D.   On: 01/24/2017 23:08    Microbiology: No results found for this or any previous visit (from the past 240 hour(s)).   Labs: Basic  Metabolic Panel:  Recent Labs Lab 01/24/17 2220 01/25/17 0327 01/26/17 0515  NA 138 136 138  K 3.7 3.4* 3.3*  CL 106 108 111  CO2 19* 20* 21*  GLUCOSE 155* 111* 99  BUN 8 9 9   CREATININE 0.96 0.77 0.87  CALCIUM 9.4 8.9 8.1*   Liver Function Tests:  Recent Labs Lab 01/24/17 2220 01/26/17 0515  AST 26 17  ALT 23 20  ALKPHOS 69 61  BILITOT 0.6 0.6  PROT 7.3 5.8*  ALBUMIN 4.1 3.3*   CBC:  Recent Labs Lab 01/24/17 2220 01/25/17 0327 01/26/17 0515  WBC 9.7 8.6 5.9  NEUTROABS 8.2*  --  3.4  HGB 14.7 13.9 12.7*  HCT 44.8 41.0 38.8*  MCV 91.2 89.9 90.0  PLT 170 157 142*   Cardiac Enzymes:  Recent Labs Lab 01/25/17 0327 01/26/17 0515  CKTOTAL 585* 730*   CBG:  Recent Labs Lab 01/25/17 0622 01/25/17 1135 01/25/17 1654 01/25/17 2123 01/26/17 0620  GLUCAP 96 107* 123* 112* 97    Signed:  Vassie Loll MD.  [facility] 01/26/2017, 2:27 PM

## 2017-01-26 NOTE — Care Management Note (Signed)
Case Management Note  Patient Details  Name: Ronald Forbes MRN: [id number] Date of Birth: Oct 05, 1960  Subjective/Objective:                    Action/Plan: Pt discharging home with [facility] RN. CM met with the patient and his sister and provided a list of [facility] agencies. They selected [facility]. Drew with [facility] notified and accepted the referral.  Both patient and sister informed to pick up discharge medications from the pharmacy and both were in agreement.  Sister to provide transportation home.   Expected Discharge Date:  01/26/17               Expected Discharge Plan:  [place]  In-House Referral:     Discharge planning Services  CM Consult  Post Acute Care Choice:  Home Health Choice offered to:  Patient, Sibling  DME Arranged:    DME Agency:     HH Arranged:  RN [place] Agency:  [place]  Status of Service:  Completed, signed off  If discussed at [place] of Stay Meetings, dates discussed:    Additional Comments:  Pollie Friar, RN 01/26/2017, 4:35 PM

## 2017-01-26 NOTE — Progress Notes (Signed)
Patient has no seizure activity this shift

## 2017-01-26 NOTE — Progress Notes (Signed)
Subjective: No further seizures, doing well on Keppra  Exam: Vitals:   01/26/17 0600 01/26/17 0945  BP:  116/75  Pulse: 68 65  Resp:  20  Temp:  98.6 F (37 C)    HEENT-  Normocephalic, no lesions, without obvious abnormality.  Normal external eye and conjunctiva.  Normal TM's bilaterally.  Normal auditory canals and external ears. Normal external nose, mucus membranes and septum.  Normal pharynx.    Neuro:  CN: Pupils are equal and round. They are symmetrically reactive from 3-->2 mm. EOMI without nystagmus. Facial sensation is intact to light touch. Face is symmetric at rest with normal strength and mobility. Hearing is intact to conversational voice. Palate elevates symmetrically and uvula is midline. Voice is normal in tone, pitch and quality. Bilateral SCM and trapezii are 5/5. Tongue is midline with normal bulk and mobility.  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  DTRs: 2+, symmetric  Toes downgoing bilaterally. No pathologic reflexes.  Coordination: Finger-to-nose and heel-to-shin are without dysmetria     Pertinent Labs/Diagnostics: EEG shows no epileptiform activity    Impression: new onset seizure now controlled with Keppra. Will need out patient follow up in 3 week and to continue Keppra 500 mg bid PO. He does not drive as he is wheelchair bound.   Per [facility] statutes, patients with seizures are not allowed to drive until  they have been seizure-free for six months. Use caution when using heavy equipment or power tools. Avoid working on ladders or at heights. Take showers instead of baths. Ensure the water temperature is not too high on the home water heater. Do not go  swimming alone. When caring for infants or small children, sit down when holding, feeding, or changing them to minimize risk of injury to the child in the event you have a seizure.   Also, Maintain good sleep hygiene. Avoid alcohol.  --> Call 911 and bring the patient back to the ED if:                   A.  The seizure lasts longer than 5 minutes.                  B.  The patient doesn't awaken shortly after the seizure             C.  The patient has new problems such as difficulty seeing, speaking or moving             D.  The patient was injured during the seizure             E.  The patient has a temperature over 102  F (39C)             F.  The patient vomited and now is having trouble breathing  Neurology S/O    Felicie MornDavid Samyak Sackmann PA-C [facility] [phone]  01/26/2017, 9:49 AM

## 2017-01-26 NOTE — Progress Notes (Signed)
Pt discharged home with sister. Discharge instructions were reviewed with pt and sister. Patient and sister both verbalized understanding.

## 2017-01-26 NOTE — Care Management Note (Signed)
Case Management Note  Patient Details  Name: Ronald Forbes MRN: [id number] Date of Birth: 04-20-1961  Subjective/Objective:                    Action/Plan: CM spoke to patients sister "Ronald Forbes" over the phone. She assists with his care. She is in agreement that patients Medicaid is active and feels he is able to obtain his medications with their co pays.  CM inquired about HHRN. Sister is in agreement with having RN come out if necessary.  [facility] Dr Lily KocherGomez. Pt will need to f/u with him at d/c for PCP.  Sister inquired about HCPOA. CM will leave forms in patients room for her.   Expected Discharge Date:                  Expected Discharge Plan:  Home w Home Health Services  In-House Referral:     Discharge planning Services  CM Consult  Post Acute Care Choice:    Choice offered to:     DME Arranged:    DME Agency:     HH Arranged:    HH Agency:     Status of Service:  In process, will continue to follow  If discussed at Long Length of Stay Meetings, dates discussed:    Additional Comments:  Ronald BaloKelli F Abbey Veith, RN 01/26/2017, 10:39 AM

## 2017-01-27 NOTE — Care Management Note (Signed)
Case Management Note  Patient Details  Name: Ronald CokeKevin Van Forbes MRN: [id number] Date of Birth: 03/18/1961  Subjective/Objective:                    Action/Plan[facility]. CM called [facility] and they will be able to see patient sooner. CM called Ronald PihJackie and she is good with switching HH agencies.   Expected Discharge Date:  01/26/17               Expected Discharge Plan:  Home w Home Health Services  In-House Referral:     Discharge planning Services  CM Consult  Post Acute Care Choice:  Home Health Choice offered to:  Patient, Sibling  DME Arranged:    DME Agency:     HH Arranged:  RN HH Agency:  [facility]  Status of Service:  Completed, signed off  If discussed at Long Length of Stay Meetings, dates discussed:    Additional Comments:  Ronald BaloKelli F Selig [name], RN 01/27/2017, 4:46 PM

## 2017-02-09 ENCOUNTER — Telehealth: Payer: Self-pay | Admitting: Physician Assistant

## 2017-02-09 NOTE — Telephone Encounter (Signed)
[facility] called to inform the PCP that PT was discharge today, form home care since PT and care giver can clean the wound, if you have any question please call Dorene Grebeatalie [phone]

## 2017-02-10 NOTE — Telephone Encounter (Signed)
[facility] called today to inform the PCP, they find out PT and care giver they don't do the wrapping of the wound the way was instructor, they do they way they want, [facility] just want to let you know FYI.Marland Kitchen..Marland Kitchen

## 2017-02-11 NOTE — Telephone Encounter (Signed)
Please call pt and advise him to take instruction from wound care personnel. See if there is a problem with what [facility] is telling him. Thank you.

## 2017-02-11 NOTE — Telephone Encounter (Signed)
Called and patient has a Geophysicist/field seismologistvoicemail box that is not set up. Will try calling again. Maryjean Mornempestt S Roberts, CMA

## 2017-02-18 NOTE — Telephone Encounter (Signed)
Patient sister called back left voicemail missed a phone from us to please call her back.

## 2017-02-18 NOTE — Telephone Encounter (Signed)
Called patient again on both numbers on file and both do not have a voicemail box set up. Ronald Forbes

## 2017-02-22 NOTE — Telephone Encounter (Signed)
Spoke with patients sister Annice PihJackie she stated that she was unsure why we were calling as patient hasnt been seen in our office in over a month. She ensures that she is providing wound care exactly as instructed by the nurse from [facility]. She stated that the wound is healing perfectly and is not infected. And she can tell the the hole is closing up well. She feels confident to continue performing wound care for the patient. Maryjean Mornempestt S Roberts, CMA

## 2017-02-23 NOTE — Telephone Encounter (Signed)
Ok, thank you

## 2017-03-04 ENCOUNTER — Encounter: Payer: Self-pay | Admitting: Vascular Surgery

## 2017-03-10 ENCOUNTER — Ambulatory Visit (INDEPENDENT_AMBULATORY_CARE_PROVIDER_SITE_OTHER): Payer: Medicaid Other | Admitting: Vascular Surgery

## 2017-03-10 ENCOUNTER — Encounter: Payer: Self-pay | Admitting: Vascular Surgery

## 2017-03-10 VITALS — BP 124/83 | HR 67 | Temp 98.4°F | Resp 16 | Ht 75.0 in | Wt 237.0 lb

## 2017-03-10 DIAGNOSIS — I739 Peripheral vascular disease, unspecified: Secondary | ICD-10-CM | POA: Diagnosis not present

## 2017-03-10 NOTE — Progress Notes (Signed)
A 56 year old male who returns for follow-up today. He underwent a right above-knee amputation 11/15/2016. This has been slowly healing. He also developed an acute DVT in May 2018 and currently is on Xarelto for this. He states the swelling in his above-knee amputation has improved. He still has a small opening on the medial aspect.  Review of systems: He denies any shortness of breath. He denies chest pain. He denies any pain in his left lower extremity.  Physical exam:  Vitals:   03/10/17 0908  BP: 124/83  Pulse: 67  Resp: 16  Temp: 98.4 F (36.9 C)  TempSrc: Oral  SpO2: 96%  Weight: 237 lb (107.5 kg)  Height: 6\' 3"  (1.905 m)    Right above-knee amputation with 6 x 6 2 mm depth opening on the medial aspect with granulation tissue at the base, still some trace edema in the amputation.  Left lower extremity foot pink warm well perfused  Assessment: #1 DVT has not been treated for 2 months. The patient will follow-up with me in 3 more months. At that point we will consider whether or not to discontinue the Xarelto.  #2 right above-knee amputation slowly healing but overall should be healed within the next few weeks. He is artery seen [facility] regarding prosthetic. Continue wearing shrinker.  Fabienne Brunsharles Tanekia Ryans, MD [facility] Office: [phone] Pager: [phone]

## 2017-03-11 ENCOUNTER — Ambulatory Visit (INDEPENDENT_AMBULATORY_CARE_PROVIDER_SITE_OTHER): Payer: Medicaid Other | Admitting: Physician Assistant

## 2017-03-11 ENCOUNTER — Encounter (INDEPENDENT_AMBULATORY_CARE_PROVIDER_SITE_OTHER): Payer: Self-pay | Admitting: Physician Assistant

## 2017-03-11 VITALS — BP 136/82 | HR 64 | Temp 98.3°F | Wt 229.8 lb

## 2017-03-11 DIAGNOSIS — G4700 Insomnia, unspecified: Secondary | ICD-10-CM

## 2017-03-11 DIAGNOSIS — Z89619 Acquired absence of unspecified leg above knee: Secondary | ICD-10-CM | POA: Diagnosis not present

## 2017-03-11 DIAGNOSIS — Z760 Encounter for issue of repeat prescription: Secondary | ICD-10-CM

## 2017-03-11 DIAGNOSIS — F4323 Adjustment disorder with mixed anxiety and depressed mood: Secondary | ICD-10-CM | POA: Diagnosis not present

## 2017-03-11 DIAGNOSIS — S78119A Complete traumatic amputation at level between unspecified hip and knee, initial encounter: Secondary | ICD-10-CM

## 2017-03-11 MED ORDER — RIVAROXABAN 20 MG PO TABS: 20.0000 mg | ORAL_TABLET | Freq: Every day | ORAL | 1 refills | Status: DC

## 2017-03-11 MED ORDER — RANITIDINE HCL 150 MG PO TABS: 150.0000 mg | ORAL_TABLET | Freq: Two times a day (BID) | ORAL | 1 refills | Status: DC

## 2017-03-11 MED ORDER — GABAPENTIN 100 MG PO CAPS: 100.0000 mg | ORAL_CAPSULE | Freq: Three times a day (TID) | ORAL | 3 refills | Status: DC

## 2017-03-11 MED ORDER — ESCITALOPRAM OXALATE 10 MG PO TABS: 10.0000 mg | ORAL_TABLET | Freq: Every day | ORAL | 1 refills | Status: DC

## 2017-03-11 MED ORDER — LEVETIRACETAM 500 MG PO TABS: 500.0000 mg | ORAL_TABLET | Freq: Two times a day (BID) | ORAL | 3 refills | Status: DC

## 2017-03-11 MED ORDER — ATORVASTATIN CALCIUM 40 MG PO TABS: 40.0000 mg | ORAL_TABLET | Freq: Every day | ORAL | 3 refills | Status: DC

## 2017-03-11 MED ORDER — CLONAZEPAM 0.5 MG PO TABS: 0.5000 mg | ORAL_TABLET | Freq: Every day | ORAL | 0 refills | Status: DC

## 2017-03-11 NOTE — Patient Instructions (Signed)
Community Resources  Advocacy/Legal Legal Aid [place]:  1-866-219-5262  /  336-272-0148  Family Justice Center:  336-641-7233  [facility] 24-hr Crisis line:  336-273-7273  Women's Resource Center, [place]:  336-275-6090  Court Watch (custody):  336-275-2346  [facility]:   336-279-9299    Baby & Breastfeeding Car Seat Inspection @ Various [place] Fire Depts.- call 336-373-2177  [facility]  336-832-6860  [facility] 336-878-6712  WIC: 336-641-3663 ([place]);  336-641-7571 ([place])  La Leche League:  1-877-452-5321   Childcare [place] [facility]: 336-369-5097 ([place]) / 336-887-8224 ([place])  - Child Care Resources/ Referrals/ Scholarships  - Head Start/ Early Head Start (call or apply online)  [place] DHHS: [place] Pre-K :  1-800-859-0829 / 336-274-5437   Employment / Job Search [facility] [place]: 336-275-6090 / [address]  [place] [facility] [employer] [facility] ([employer]Link): 336-373-5922 ([place]) / 336-882-4141 ([place])  [facility]/ Career Center: 336-275-9801 / 336-282-7307  [facility] Job [facility] Career Center: 336-373-3764  DHHS Work First: 336-641-3447 ([place]) / 336-641-3447 ([place])  [facility] [place]:  336-676-5871   Financial Assistance [facility]:  336-553-2657  [facility]: 336-235-0368  [employer] (furniture):  336-370-4002  [place] [facility]: 336-373-4264  Low Income Energy Assistance  336-641-3000   Food Assistance DHHS- SNAP/ Food Stamps: 336-641-4588  WIC: [place]- 336-641-3663 ;  [place] 336-641-7571  Little Green Book- Free Meals  Little Blue Book- Free Food Pantries  During the summer, text "FOOD" to 877877   General Health / Clinics (Adults) Orange Card (for Adults) through [facility]: (336) 895-4900  [facility]:   336-832-8035  [facility]:   336-832-4444  Health Department:  336-641-3245  [facility]:  336-415-3877 / 336-641-2100  [facility] [place]:   336-373-0678  [facility]:   336-334-4822 x 50251   Housing [place] [facility]:   336-691-9521  [place] [facility] Authority:  336-275-8501  Affordable Housing Managemnt:  336-273-0568   Immigrant/ Refugee Center for [place]ians ([employer]CG):  336-256-1065  Fa[facility]:  336-379-0037  [facility] [facility]:  336-937-4701  [employer] Services:  336-617-0381  African Services Coalition:  336-574-2677   LGBTQ YouthSAFE  www.youthsafe[place].org  PFLAG  336-541-6754 / info@[place].org  The [facility] Project:  1-866-488-7386   Mental Health/ Substance Use [facility]  336-387-6161  [facility]:  336-832-9700 or 1-800-711-2635  [facility] of Care:  336-271-5888  [facility]:  336-294-1349  [facility]:  336-542-2884  [facility] (walk-ins)  336-676-6840 / [address]  [place]:  800-449-1287  Alcoholics Anonymous:  336-854-4278  Narcotics Anonymous:  800-365-1036  Quit Smoking Hotline:  800-QUIT-NOW (800-784-8669)   Parenting Children's Home Society:  800-632-1400  [place]: Education Center & Support Groups:  336-832-6682  YWCA: 336-273-3461  [employer]: Bringing Out the Best:  336-334-3120               Thriving at Three (Hispanic families): 336-256-1066  Healthy Start ([facility]):  336-387-6161 x2288  Parents as Teachers:  336-691-0024  [place] [facility]- Learning Together (Immigrants): 336-369-5001   Poison Control 800-222-1222  Sports & Recreation [employer] Open Doors Application: ymcanwnc.org/join/open-doors-financial-assistance/  City of [place] Recreation Centers: http://www.[place].gov/index.aspx?page=3615   Special Needs Family Support Network:  336-832-6507  Autism Society of :   336-333-0197 x1402 or x1412 /  800-785-1035  [facility] [place]:  336-334-5773     ARC of [place]:  336-739-9151437-056-3431  Children's Developmental Service Agency (CDSA):  864 206 0206541-242-4428  [facility] (Care Coordination for Children):  939-212-8362581-622-8917   Transportation Medicaid Transportation: 203 273 39597084756168 to apply  [place]: ([phone] (reduced-fare bus ID to Medicaid/ Medicare/ Orange Card)  SCAT Paratransit services: Eligible riders only, call [phone] for application   Tutoring/Mentoring Black Child Development Institute: 662-197-2635  Big Brothers/ Big Sisters: 636-461-1234(919)801-8500 Manley Mason([place])  919-794-5543463-531-9616 ([place])  ACES through child's school: 225-043-8772  [employer] Achievers: contact your local Y  SHIELD Mentor Program: ([phone]     Adjustment Disorder, Adult Adjustment disorder is a group of symptoms that can develop after a stressful life event, such as the loss of a job or serious physical illness. The symptoms can affect how you feel, think, and act. They may interfere with your relationships. Adjustment disorder increases your risk of suicide and substance abuse. If this disorder is not managed early, it can develop into a more serious condition, such as major depressive disorder or post-traumatic stress disorder. What are the causes? This condition happens when you have trouble recovering from or coping with a stressful life event. What increases the risk? You are more likely to develop this condition if:  You have had depression or anxiety.  You are being treated for a long-term (chronic) illness.  You are being treated for an illness that cannot be cured (terminal illness).  You have a family history of mental illness.  What are the signs or symptoms? Symptoms of this condition include:  Extreme trouble doing daily tasks, such as going to work.  Sadness, depression, or crying spells.  Worrying a lot.  Loss of enjoyment.  Change in appetite or weight.  Feelings of loss or hopelessness.  Thoughts of suicide.  Anxiety, worry, or  nervousness.  Trouble sleeping.  Avoiding family and friends.  Fighting or vandalism.  Complaining of feeling sick without being ill.  Feeling dazed or disconnected.  Nightmares.  Trouble sleeping.  Irritability.  Reckless driving.  Poor work International aid/development workerperformance.  Ignoring bills.  Symptoms of this condition start within three months of the stressful event. They do not last more than six months, unless the stressful circumstances last longer. Normal grieving after the death of a loved one is not a symptom of this condition. How is this diagnosed? To diagnose this condition, your health care provider will ask about what has happened in your life and how it has affected you. He or she may also ask about your medical history and your use of medicines, alcohol, and other substances. Your health care provider may do a physical exam and order lab tests or other studies. You may be referred to a mental health specialist. How is this treated? Treatment options for this condition include:  Counseling or talk therapy. Talk therapy is usually provided by mental health specialists.  Medicines. Certain medicines may help with depression, anxiety, and sleep.  Support groups. These offer emotional support, advice, and guidance. They are made up of people who have had similar experiences.  Observation and time. This is sometimes called "watchful waiting." In this treatment, health care providers monitor your health and behavior without other treatment. Adjustment disorder sometimes gets better on its own with time.  Follow these instructions at home:  Take over-the-counter and prescription medicines only as told by your health care provider.  Keep all follow-up visits as told by your health care provider. This is important. Contact a health care provider if:  Your symptoms do not improve in six months.  Your symptoms get worse. Get help right away if:  You have serious thoughts about hurting  yourself or someone else. If you ever feel like you may hurt yourself or others, or have thoughts about taking your own life, get help right away. You can go to your nearest emergency department or call:  Your local emergency services (911 in the [place]  A suicide crisis helpline, such as the National Suicide Prevention Lifeline at 58750344941-(919)415-1368. This is open 24 hours a day.  Summary  Adjustment disorder is a group of symptoms that can develop after a stressful life event, such as the loss of a job or serious physical illness. The symptoms can affect how you feel, think, and act. They may interfere with your relationships.  Symptoms of this condition start within three months of the stressful event. They do not last more than six months, unless the stressful circumstances last longer.  Treatment may include talk therapy, medicines, participation in a support group, or observation to see if symptoms improve.  Contact your health care provider if your symptoms get worse or do not improve in six months.  If you ever feel like you may hurt yourself or others, or have thoughts about taking your own life, get help right away. This information is not intended to replace advice given to you by your health care provider. Make sure you discuss any questions you have with your health care provider. Document Released: 04/06/2006 Document Revised: 10/01/2016 Document Reviewed: 10/01/2016 Elsevier Interactive Patient Education  [employer]2018 Elsevier Inc.

## 2017-03-11 NOTE — Progress Notes (Signed)
Subjective:  Patient ID: Ronald Forbes, male    DOB: 08-15-1961  Age: 56 y.o. MRN: [id number]  CC: f/u DVT  HPI Ronald Forbes is a 56 y.o. male with recent hx of DVT and of right AKA in the setting of DM2, PAD, and CVA presents on f/u of right AKA post surgical management. Had all his staples removed here except for one at the last visit here. Does not complain of infection at the staple sites. Has already f/u with vascular on 01/20/17 with plan to continue on Xarelto for 5 more months. Requests medications refill. Went to the ED last month because he ran out of Keppra. Went to neurology on 01/25/17 for an EEG which revealed mild focal slowing on the left hemisphere compared to the right. An MRI was subsequently conducted and revealed left PCA territory infarct with extensive surrounding encephalomalacia that also involves the medial left temporal lobe. The area of encephalomalacia could serve as a seizure focus. Areas of diffusion restriction within the infarcted territory are similar to previous exam. No acute intracranial abnormality. Pt still awaiting neurology f/u post MRI. Pt does not endorse any other complaints or symptoms.     Patient also complains of irritability, frustration, insomnia, depression, and anxiety since his AKA. Has not seen a psychologist/psychiatrist. Has not been prescibed or taken any OTC medications. Symptoms existed previously due to stroke but exacerbated when he had AKA. Does not endorse suicidal ideation or intent.    Outpatient Medications Prior to Visit  Medication Sig Dispense Refill  . acetaminophen (TYLENOL) 325 MG tablet Take 2 tablets (650 mg total) by mouth every 6 (six) hours as needed for mild pain (or Fever >/= 101). 40 tablet 0  . atorvastatin (LIPITOR) 40 MG tablet Take 1 tablet (40 mg total) by mouth daily at 6 PM. 30 tablet 1  . gabapentin (NEURONTIN) 100 MG capsule Take 1 capsule (100 mg total) by mouth 3 (three) times daily. 90 capsule 1  .  levETIRAcetam (KEPPRA) 500 MG tablet Take 1 tablet (500 mg total) by mouth 2 (two) times daily. 60 tablet 1  . methocarbamol (ROBAXIN) 500 MG tablet Take 1 tablet (500 mg total) by mouth every 8 (eight) hours as needed for muscle spasms. 40 tablet 0  . nicotine (NICODERM CQ - DOSED IN MG/24 HOURS) 21 mg/24hr patch Place 1 patch (21 mg total) onto the skin daily. 28 patch 0  . nortriptyline (PAMELOR) 25 MG capsule Take 1 capsule (25 mg total) by mouth at bedtime. 30 capsule 1  . pantoprazole (PROTONIX) 40 MG tablet Take 1 tablet (40 mg total) by mouth daily. 30 tablet 1  . rivaroxaban (XARELTO) 20 MG TABS tablet Take 1 tablet (20 mg total) by mouth daily with supper. 30 tablet 3  . senna-docusate (SENOKOT-S) 8.6-50 MG tablet Take 2 tablets by mouth at bedtime as needed for mild constipation. 60 tablet 0   No facility-administered medications prior to visit.      ROS Review of Systems  Constitutional: Negative for chills, fever and malaise/fatigue.  Eyes: Negative for blurred vision.  Respiratory: Negative for shortness of breath.   Cardiovascular: Negative for chest pain and palpitations.  Gastrointestinal: Negative for abdominal pain and nausea.  Genitourinary: Negative for dysuria and hematuria.  Musculoskeletal: Negative for joint pain and myalgias.  Skin: Negative for rash.  Neurological: Negative for tingling and headaches.  Psychiatric/Behavioral: Negative for depression. The patient is not nervous/anxious.     Objective:  BP 136/82 (BP Location:  Left Arm, Patient Position: Sitting, Cuff Size: Large)   Pulse 64   Temp 98.3 F (36.8 C) (Oral)   Wt 229 lb 12.8 oz (104.2 kg)   SpO2 97%   BMI 28.72 kg/m   BP/Weight 03/11/2017 03/10/2017 01/26/2017  Systolic BP 136 124 127  Diastolic BP 82 83 82  Wt. (Lbs) 229.8 237 -  BMI 28.72 29.62 -      Physical Exam  Constitutional: He is oriented to person, place, and time.  Well developed, overweight, NAD, polite  HENT:  Head:  Normocephalic and atraumatic.  Eyes: No scleral icterus.  Cardiovascular: Normal rate, regular rhythm and normal heart sounds.   Pulmonary/Chest: Effort normal and breath sounds normal.  Musculoskeletal: He exhibits no edema.  Right AKA. Significantly less edema of the remaining RLE.  Neurological: He is alert and oriented to person, place, and time.  Slowed speech, thoughst linear, answers appropriate  Skin: Skin is warm and dry. No rash noted. No erythema. No pallor.  Small area of wound dehiscence on medial aspect stump, better than previous visit. One retained staple on lateral aspect of stump.  Psychiatric: He has a normal mood and affect. His behavior is normal. Thought content normal.  Vitals reviewed.    Assessment & Plan:   1. Unilateral AKA (HCC) - Refill rivaroxaban (XARELTO) 20 MG TABS tablet; Take 1 tablet (20 mg total) by mouth daily with supper.  Dispense: 90 tablet; Refill: 1  2. Adjustment disorder with mixed anxiety and depressed mood - Ambulatory referral to Psychiatry - Begin clonazePAM (KLONOPIN) 0.5 MG tablet; Take 1 tablet (0.5 mg total) by mouth at bedtime.  Dispense: 21 tablet; Refill: 0 - Begin escitalopram (LEXAPRO) 10 MG tablet; Take 1 tablet (10 mg total) by mouth daily.  Dispense: 30 tablet; Refill: 1  3. Insomnia, unspecified type - Begin clonazepam 0.5 mg qhs #21  4. Medication refill - atorvastatin (LIPITOR) 40 MG tablet; Take 1 tablet (40 mg total) by mouth daily at 6 PM.  Dispense: 90 tablet; Refill: 3 - gabapentin (NEURONTIN) 100 MG capsule; Take 1 capsule (100 mg total) by mouth 3 (three) times daily.  Dispense: 270 capsule; Refill: 3 - levETIRAcetam (KEPPRA) 500 MG tablet; Take 1 tablet (500 mg total) by mouth 2 (two) times daily.  Dispense: 180 tablet; Refill: 3 - ranitidine (ZANTAC) 150 MG tablet; Take 1 tablet (150 mg total) by mouth 2 (two) times daily.  Dispense: 60 tablet; Refill: 1    Follow-up: Return in about 6 weeks (around 04/22/2017)  for adjustment disorder.   Loletta Specteroger David Gomez PA

## 2017-03-12 LAB — CBC WITH DIFFERENTIAL/PLATELET
BASOS: 0 %
Basophils Absolute: 0 10*3/uL (ref 0.0–0.2)
EOS (ABSOLUTE): 0.1 10*3/uL (ref 0.0–0.4)
EOS: 1 %
HEMATOCRIT: 44.9 % (ref 37.5–51.0)
HEMOGLOBIN: 15 g/dL (ref 13.0–17.7)
IMMATURE GRANULOCYTES: 0 %
Immature Grans (Abs): 0 10*3/uL (ref 0.0–0.1)
LYMPHS ABS: 2.1 10*3/uL (ref 0.7–3.1)
Lymphs: 34 %
MCH: 29.7 pg (ref 26.6–33.0)
MCHC: 33.4 g/dL (ref 31.5–35.7)
MCV: 89 fL (ref 79–97)
MONOCYTES: 5 %
MONOS ABS: 0.3 10*3/uL (ref 0.1–0.9)
Neutrophils Absolute: 3.8 10*3/uL (ref 1.4–7.0)
Neutrophils: 60 %
Platelets: 134 10*3/uL — ABNORMAL LOW (ref 150–379)
RBC: 5.05 x10E6/uL (ref 4.14–5.80)
RDW: 15.8 % — AB (ref 12.3–15.4)
WBC: 6.3 10*3/uL (ref 3.4–10.8)

## 2017-03-12 LAB — COMPREHENSIVE METABOLIC PANEL
A/G RATIO: 1.6 (ref 1.2–2.2)
ALBUMIN: 4.5 g/dL (ref 3.5–5.5)
ALT: 28 IU/L (ref 0–44)
AST: 17 IU/L (ref 0–40)
Alkaline Phosphatase: 79 IU/L (ref 39–117)
BUN / CREAT RATIO: 12 (ref 9–20)
BUN: 9 mg/dL (ref 6–24)
Bilirubin Total: 0.3 mg/dL (ref 0.0–1.2)
CALCIUM: 9.7 mg/dL (ref 8.7–10.2)
CO2: 25 mmol/L (ref 20–29)
CREATININE: 0.74 mg/dL — AB (ref 0.76–1.27)
Chloride: 104 mmol/L (ref 96–106)
GFR calc Af Amer: 120 mL/min/{1.73_m2} (ref >59)
GFR, EST NON AFRICAN AMERICAN: 104 mL/min/{1.73_m2} (ref >59)
GLOBULIN, TOTAL: 2.8 g/dL (ref 1.5–4.5)
Glucose: 135 mg/dL — ABNORMAL HIGH (ref 65–99)
POTASSIUM: 3.9 mmol/L (ref 3.5–5.2)
SODIUM: 145 mmol/L — AB (ref 134–144)
Total Protein: 7.3 g/dL (ref 6.0–8.5)

## 2017-04-22 ENCOUNTER — Encounter (INDEPENDENT_AMBULATORY_CARE_PROVIDER_SITE_OTHER): Payer: Self-pay | Admitting: Physician Assistant

## 2017-04-22 ENCOUNTER — Ambulatory Visit (INDEPENDENT_AMBULATORY_CARE_PROVIDER_SITE_OTHER): Payer: Medicaid Other | Admitting: Physician Assistant

## 2017-04-22 VITALS — Ht 75.0 in | Wt 221.0 lb

## 2017-04-22 DIAGNOSIS — R569 Unspecified convulsions: Secondary | ICD-10-CM | POA: Diagnosis not present

## 2017-04-22 DIAGNOSIS — F4323 Adjustment disorder with mixed anxiety and depressed mood: Secondary | ICD-10-CM

## 2017-04-22 DIAGNOSIS — Z760 Encounter for issue of repeat prescription: Secondary | ICD-10-CM | POA: Diagnosis not present

## 2017-04-22 MED ORDER — ATORVASTATIN CALCIUM 40 MG PO TABS: 40.0000 mg | ORAL_TABLET | Freq: Every day | ORAL | 3 refills | Status: DC

## 2017-04-22 MED ORDER — LEVETIRACETAM 500 MG PO TABS: 500.0000 mg | ORAL_TABLET | Freq: Two times a day (BID) | ORAL | 3 refills | Status: DC

## 2017-04-22 NOTE — Progress Notes (Signed)
Subjective:  Patient ID: Ronald Forbes, male    DOB: 10/13/1960  Age: 55 y.o. MRN: [id number]  CC: f/u mood   HPI Ronald Forbes is a 56 y.o. male with a medical history of right AKA in the setting of DM2, PAD, HLD, seizures, CVA, Tobacco absuse, and ETOH abuse presents on f/u of adjustment disorder 2/2 AKA and insomnia. He was prescribed clonazepam 0.5 mg qhs and escitalopram 10 mg qday on 03/11/17. Reports taking Clonazepam as directed but is not taking Escitalopram. Says he is feeling better in regards to mood and does not feel he needs Clonazepam or Sertraline. Does not have irritability, frustration, depression, anxiety, or insomnia any longer. Referred to psychiatry on 03/11/17 but has not been able to see psychiatry due to lack of funds. Sister accompanies patient and states he will run out of medications due to lack of funds. Pt has medicaid and the pharmacy has charged him $28 dollars for all his medications which he can not afford.     In the interim, patient has been to vascular f/u appointments. Last vascular note from 03/10/17 remarks patient is recovering as expected and his small dehiscence should be closing naturally within a few weeks. He is also expected to be fitted for a prosthesis.     Outpatient Medications Prior to Visit  Medication Sig Dispense Refill  . atorvastatin (LIPITOR) 40 MG tablet Take 1 tablet (40 mg total) by mouth daily at 6 PM. 90 tablet 3  . clonazePAM (KLONOPIN) 0.5 MG tablet Take 1 tablet (0.5 mg total) by mouth at bedtime. 21 tablet 0  . escitalopram (LEXAPRO) 10 MG tablet Take 1 tablet (10 mg total) by mouth daily. 30 tablet 1  . gabapentin (NEURONTIN) 100 MG capsule Take 1 capsule (100 mg total) by mouth 3 (three) times daily. 270 capsule 3  . levETIRAcetam (KEPPRA) 500 MG tablet Take 1 tablet (500 mg total) by mouth 2 (two) times daily. 180 tablet 3  . nicotine (NICODERM CQ - DOSED IN MG/24 HOURS) 21 mg/24hr patch Place 1 patch (21 mg total) onto the skin  daily. 28 patch 0  . ranitidine (ZANTAC) 150 MG tablet Take 1 tablet (150 mg total) by mouth 2 (two) times daily. 60 tablet 1  . rivaroxaban (XARELTO) 20 MG TABS tablet Take 1 tablet (20 mg total) by mouth daily with supper. 90 tablet 1   No facility-administered medications prior to visit.      ROS Review of Systems  Constitutional: Negative for chills, fever and malaise/fatigue.  Eyes: Negative for blurred vision.  Respiratory: Negative for shortness of breath.   Cardiovascular: Negative for chest pain and palpitations.  Gastrointestinal: Negative for abdominal pain and nausea.  Genitourinary: Negative for dysuria and hematuria.  Musculoskeletal: Negative for joint pain and myalgias.  Skin: Negative for rash.  Neurological: Negative for tingling and headaches.  Psychiatric/Behavioral: Negative for depression. The patient is not nervous/anxious.     Objective:  There were no vitals taken for this visit.  BP/Weight 03/11/2017 03/10/2017 01/26/2017  Systolic BP 136 124 127  Diastolic BP 82 83 82  Wt. (Lbs) 229.8 237 -  BMI 28.72 29.62 -      Physical Exam  Constitutional: He is oriented to person, place, and time.  Well developed, overweigh, NAD, polite, using wheelchair  HENT:  Head: Normocephalic and atraumatic.  Eyes: No scleral icterus.  Neck: Normal range of motion. Neck supple. No thyromegaly present.  Cardiovascular: Normal rate, regular rhythm and normal  heart sounds.   Pulmonary/Chest: Effort normal and breath sounds normal.  Musculoskeletal: He exhibits no edema.  Neurological: He is alert and oriented to person, place, and time.  Skin: Skin is warm and dry. No rash noted. No erythema. No pallor.  Psychiatric: He has a normal mood and affect. His behavior is normal. Thought content normal.  Vitals reviewed.    Assessment & Plan:     1. Adjustment disorder with mixed anxiety and depressed mood - Seems to be resolved now that patient is accepting the fact  he will live his life with right AKA. He is also hopeful of a "normal" life now that he has a prosthesis.   2. Seizures (HCC) - Refill levETIRAcetam (KEPPRA) 500 MG tablet; Take 1 tablet (500 mg total) by mouth 2 (two) times daily.  Dispense: 180 tablet; Refill: 3  3. Medication refill - atorvastatin (LIPITOR) 40 MG tablet; Take 1 tablet (40 mg total) by mouth daily at 6 PM.  Dispense: 90 tablet; Refill: 3   *Sister accompanies patient again and is very confrontational and unhelpful to the patient's case. I explained that patient can go to [facility] to pick up medications since they can't afford the $28 bill at their pharmacy. I added that the advantage to [facility] is that meds could be paid in monthly installments even if it is one dollar a month. Sister rolled her eyes and replied, "well he can't afford that, he only has $3 in his bank account".  I offered to reprint the list of community resources for the purpose of showing the financial resources in the community but she stated, "For what, we already have it at home". I then asked if she had called the numbers under financial assistance and she said no. I then asked patient to try and call for himself. This is concerning as sister has taken it upon herself to be his caretaker but it seems she is not very involved or effective as his caretaker. Patient is stable and improving for now but we will need to see if patient's financial status will ultimately affect his health and recuperation. It is my hope that he continue to receive medical help and to find a source of financial assistance or income.     Meds ordered this encounter  Medications  . levETIRAcetam (KEPPRA) 500 MG tablet    Sig: Take 1 tablet (500 mg total) by mouth 2 (two) times daily.    Dispense:  180 tablet    Refill:  3    Order Specific Question:   Supervising Provider    Answer:   Quentin AngstJEGEDE, OLUGBEMIGA E [id number]]  . atorvastatin (LIPITOR) 40 MG tablet    Sig: Take 1  tablet (40 mg total) by mouth daily at 6 PM.    Dispense:  90 tablet    Refill:  3    Order Specific Question:   Supervising Provider    Answer:   Quentin AngstJEGEDE, OLUGBEMIGA E [[id number]]    Follow-up: 3 months for full physical  Loletta Specteroger David Gomez PA

## 2017-04-22 NOTE — Patient Instructions (Addendum)
Community Resources  Advocacy/Legal Legal Aid [place]:  1-866-219-5262  /  336-272-0148  Family Justice Center:  336-641-7233  [facility] [place] 24-hr Crisis line:  336-273-7273  Women's Resource Center, [place]:  336-275-6090  Court Watch (custody):  336-275-2346  [place] [facility]:   336-279-9299    Baby & Breastfeeding Car Seat Inspection @ Various [place] Fire Depts.- call 336-373-2177  [place] [facility]  336-832-6860  [facility] 336-878-6712  WIC: 336-641-3663 ([place]);  336-641-7571 ([place])  La Leche League:  1-877-452-5321   Childcare [place] [facility]: 336-369-5097 ([place]) / 336-887-8224 ([place])  - Child Care Resources/ Referrals/ Scholarships  - Head Start/ Early Head Start (call or apply online)  [place] DHHS: [place] Pre-K :  1-800-859-0829 / 336-274-5437   Employment / Job Search [facility] [place]: 336-275-6090 / [address]  [place] [employer] (JobLink): 336-373-5922 ([place]) / 336-882-4141 ([place])  [facility]/ Career Center: 336-275-9801 / 336-282-7307  [facility] Job & Career Center: 336-373-3764  DHHS Work First: 336-641-3447 ([place]) / 336-641-3447 ([place])  [facility] [place]:  336-676-5871   Financial Assistance [facility]:  336-553-2657  [facility]: 336-235-0368  [employer] (furniture):  336-370-4002  [place] [facility]: 336-373-4264  Low Income Energy Assistance  336-641-3000   Food Assistance DHHS- SNAP/ Food Stamps: 336-641-4588  WIC: [place]- 336-641-3663 ;  [place] 336-641-7571  Little Green Book- Free Meals  Little Blue Book- Free Food Pantries  During the summer, text "FOOD" to 877877   General Health / Clinics (Adults) Orange Card (for Adults) through [facility]: (336) 895-4900  [facility]:   336-832-8035  [facility]:   336-832-4444  Health Department:  336-641-3245  [facility]:  336-415-3877 / 336-641-2100  [facility]anned [facility] [place]:   336-373-0678  [facility]:   336-334-4822 x 50251   Housing [place] [facility] Coalition:   336-691-9521  [place] Housing Authority:  336-275-8501  Affordable Housing Managemnt:  336-273-0568   Immigrant/ Refugee Center for [place]ians (UNCG):  336-256-1065  Fa[facility]:  336-379-0037  [employer] [facility]:  336-937-4701  Church World Services:  336-617-0381  African Services Coalition:  336-574-2677   LGBTQ YouthSAFE  www.youthsafe[place].org  PFLAG  336-541-6754 / info@[place].org  The Trevor Project:  1-866-488-7386   Mental Health/ Substance Use [facility]  336-387-6161  [facility]:  336-832-9700 or 1-800-711-2635  [facility] of Care:  336-271-5888  [facility]:  336-294-1349  [facility]:  336-542-2884  [facility] (walk-ins)  336-676-6840 / [address]  [place]:  800-449-1287  Alcoholics Anonymous:  336-854-4278  Narcotics Anonymous:  800-365-1036  Quit Smoking Hotline:  800-QUIT-NOW (800-784-8669)   Parenting Children's Home Society:  800-632-1400  [place]: Education Center & Support Groups:  336-832-6682  YWCA: 336-273-3461  [employer]CG: Bringing Out the Best:  336-334-3120               Thriving at Three (Hispanic families): 336-256-1066  Healthy Start ([facility]):  336-387-6161 x2288  Parents as Teachers:  336-691-0024  [place] [facility]- Learning Together (Immigrants): 336-369-5001   Poison Control 800-222-1222  Sports & Recreation [employer] Open Doors Application: ymcanwnc.org/join/open-doors-financial-assistance/  City of [place] Recreation Centers: http://www.[place].gov/index.aspx?page=3615   Special Needs Family Support Network:  336-832-6507  Autism Society of :   336-333-0197 x1402 or x1412 /  800-785-1035  [facility] [place]:  336-334-5773     [facility] of [place]:  707 166 1141  Children's Developmental Service Agency (CDSA):  (202) 294-1723  [facility] (Care Coordination for Children):  407-443-9988   Transportation Medicaid Transportation: (601)863-4635 to apply  [place] Authority: 986-178-2877 (reduced-fare bus ID to Medicaid/ Medicare/ Orange Card)  SCAT Paratransit services: Eligible riders only, call (705) 420-0582 for application   Tutoring/Mentoring [facility] Child [facility] Institute: (306)704-0884  [facility] Brothers/ Big Sisters: 773-050-4485 Manley Mason)  914 381 6271 ([place])  ACES through child's school: (309) 752-2420  [employer] Achievers: contact your local Y  SHIELD Mentor Program: (581)789-5321    Seizure, Adult A seizure is a sudden burst of abnormal electrical activity in the brain. The abnormal activity temporarily interrupts normal brain function, causing a person to experience any of the following:  Involuntary movements.  Changes in awareness or consciousness.  Uncontrollable shaking (convulsions).  Seizures usually last from 30 seconds to 2 minutes. They usually do not cause permanent brain damage unless they are prolonged. What can cause a seizure to happen? Seizures can happen for many reasons including:  A fever.  Low blood sugar.  A medicine.  An illnesses.  A brain injury.  Some people who have a seizure never have another one. People who have repeated seizures have a condition called epilepsy. What are the symptoms of a seizure? Symptoms of a seizure vary greatly from person to person. They include:  Convulsions.  Stiffening of the body.  Involuntary movements of the arms or legs.  Loss of consciousness.  Breathing problems.  Falling suddenly.  Confusion.  Head nodding.  Eye blinking or fluttering.  Lip smacking.  Drooling.  Rapid eye movements.  Grunting.  Loss of bladder control and bowel control.  Staring.  Unresponsiveness.  Some people have symptoms right before a seizure  happens (aura) and right after a seizure happens. Symptoms of an aura include:  Fear or anxiety.  Nausea.  Feeling like the room is spinning (vertigo).  A feeling of having seen or heard something before (deja vu).  Odd tastes or smells.  Changes in vision, such as seeing flashing lights or spots.  Symptoms that may follow a seizure include:  Confusion.  Sleepiness.  Headache.  Weakness of one side of the body.  Follow these instructions at home: Medicines   Take over-the-counter and prescription medicines only as told by your health care provider.  Avoid any substances that may prevent your medicine from working properly, such as alcohol. Activity  Do not drive, swim, or do any other activities that would be dangerous if you had another seizure. Wait until your health care provider approves.  If you live in the U[place] check with your local DMV (department of motor vehicles) to find out about the local driving laws. Each state has specific rules about when you can legally return to driving.  Get enough rest. Lack of sleep can make seizures more likely to occur. Educating others Teach friends and family what to do if you have a seizure. They should:  Lay you on the ground to prevent a fall.  Cushion your head and body.  Loosen any tight clothing around your neck.  Turn you on your side. If vomiting occurs, this helps keep your airway clear.  Stay with you until you recover.  Not hold you down. Holding you down will not stop the seizure.  Not put anything in your mouth.  Know whether or not you need emergency care.  General instructions  Contact your health care provider each time you have a seizure.  Avoid anything that  has ever triggered a seizure for you.  Keep a seizure diary. Record what you remember about each seizure, especially anything that might have triggered the seizure.  Keep all follow-up visits as told by your health care provider. This is  important. Contact a health care provider if:  You have another seizure.  You have seizures more often.  Your seizure symptoms change.  You continue to have seizures with treatment.  You have symptoms of an infection or illness. They might increase your risk of having a seizure. Get help right away if:  You have a seizure: ? That lasts longer than 5 minutes. ? That is different than previous seizures. ? That leaves you unable to speak or use a part of your body. ? That makes it harder to breathe. ? After a head injury.  You have: ? Multiple seizures in a row. ? Confusion or a severe headache right after a seizure.  You are having seizures more often.  You do not wake up immediately after a seizure.  You injure yourself during a seizure. These symptoms may represent a serious problem that is an emergency. Do not wait to see if the symptoms will go away. Get medical help right away. Call your local emergency services (911 in the [place] Do not drive yourself to the hospital. This information is not intended to replace advice given to you by your health care provider. Make sure you discuss any questions you have with your health care provider. Document Released: 07/30/2000 Document Revised: 03/28/2016 Document Reviewed: 03/05/2016 Elsevier Interactive Patient Education  2017 [employer]

## 2017-06-16 ENCOUNTER — Ambulatory Visit: Payer: Medicaid Other | Admitting: Vascular Surgery

## 2017-07-11 ENCOUNTER — Other Ambulatory Visit: Payer: Self-pay

## 2017-07-11 ENCOUNTER — Emergency Department (HOSPITAL_COMMUNITY): Payer: Medicaid Other

## 2017-07-11 ENCOUNTER — Inpatient Hospital Stay (HOSPITAL_COMMUNITY)
Admission: EM | Admit: 2017-07-11 | Discharge: 2017-07-15 | DRG: 176 | Disposition: A | Discharge status: Home / Self Care | Payer: Medicaid Other | Attending: Internal Medicine | Admitting: Internal Medicine

## 2017-07-11 ENCOUNTER — Encounter (HOSPITAL_COMMUNITY): Payer: Self-pay | Admitting: Emergency Medicine

## 2017-07-11 DIAGNOSIS — Z7982 Long term (current) use of aspirin: Secondary | ICD-10-CM

## 2017-07-11 DIAGNOSIS — E785 Hyperlipidemia, unspecified: Secondary | ICD-10-CM | POA: Diagnosis present

## 2017-07-11 DIAGNOSIS — I2699 Other pulmonary embolism without acute cor pulmonale: Secondary | ICD-10-CM

## 2017-07-11 DIAGNOSIS — F1010 Alcohol abuse, uncomplicated: Secondary | ICD-10-CM | POA: Diagnosis present

## 2017-07-11 DIAGNOSIS — I739 Peripheral vascular disease, unspecified: Secondary | ICD-10-CM | POA: Diagnosis present

## 2017-07-11 DIAGNOSIS — I69351 Hemiplegia and hemiparesis following cerebral infarction affecting right dominant side: Secondary | ICD-10-CM

## 2017-07-11 DIAGNOSIS — E041 Nontoxic single thyroid nodule: Secondary | ICD-10-CM | POA: Diagnosis present

## 2017-07-11 DIAGNOSIS — K5900 Constipation, unspecified: Secondary | ICD-10-CM | POA: Diagnosis present

## 2017-07-11 DIAGNOSIS — E1151 Type 2 diabetes mellitus with diabetic peripheral angiopathy without gangrene: Secondary | ICD-10-CM | POA: Diagnosis present

## 2017-07-11 DIAGNOSIS — R109 Unspecified abdominal pain: Secondary | ICD-10-CM

## 2017-07-11 DIAGNOSIS — Z79899 Other long term (current) drug therapy: Secondary | ICD-10-CM

## 2017-07-11 DIAGNOSIS — Z9114 Patient's other noncompliance with medication regimen: Secondary | ICD-10-CM

## 2017-07-11 DIAGNOSIS — E119 Type 2 diabetes mellitus without complications: Secondary | ICD-10-CM | POA: Diagnosis present

## 2017-07-11 DIAGNOSIS — R509 Fever, unspecified: Secondary | ICD-10-CM

## 2017-07-11 DIAGNOSIS — I6930 Unspecified sequelae of cerebral infarction: Secondary | ICD-10-CM

## 2017-07-11 DIAGNOSIS — Z86718 Personal history of other venous thrombosis and embolism: Secondary | ICD-10-CM

## 2017-07-11 DIAGNOSIS — Z89611 Acquired absence of right leg above knee: Secondary | ICD-10-CM

## 2017-07-11 DIAGNOSIS — S78119A Complete traumatic amputation at level between unspecified hip and knee, initial encounter: Secondary | ICD-10-CM

## 2017-07-11 DIAGNOSIS — E1169 Type 2 diabetes mellitus with other specified complication: Secondary | ICD-10-CM | POA: Diagnosis present

## 2017-07-11 DIAGNOSIS — D696 Thrombocytopenia, unspecified: Secondary | ICD-10-CM | POA: Diagnosis present

## 2017-07-11 DIAGNOSIS — I10 Essential (primary) hypertension: Secondary | ICD-10-CM | POA: Diagnosis present

## 2017-07-11 DIAGNOSIS — Z7901 Long term (current) use of anticoagulants: Secondary | ICD-10-CM

## 2017-07-11 DIAGNOSIS — L8990 Pressure ulcer of unspecified site, unspecified stage: Secondary | ICD-10-CM

## 2017-07-11 DIAGNOSIS — G40909 Epilepsy, unspecified, not intractable, without status epilepticus: Secondary | ICD-10-CM | POA: Diagnosis present

## 2017-07-11 DIAGNOSIS — E669 Obesity, unspecified: Secondary | ICD-10-CM

## 2017-07-11 DIAGNOSIS — R569 Unspecified convulsions: Secondary | ICD-10-CM

## 2017-07-11 DIAGNOSIS — S78111A Complete traumatic amputation at level between right hip and knee, initial encounter: Secondary | ICD-10-CM

## 2017-07-11 DIAGNOSIS — E042 Nontoxic multinodular goiter: Secondary | ICD-10-CM | POA: Diagnosis present

## 2017-07-11 DIAGNOSIS — Z599 Problem related to housing and economic circumstances, unspecified: Secondary | ICD-10-CM

## 2017-07-11 DIAGNOSIS — F17210 Nicotine dependence, cigarettes, uncomplicated: Secondary | ICD-10-CM | POA: Diagnosis present

## 2017-07-11 HISTORY — DX: Other pulmonary embolism without acute cor pulmonale: I26.99

## 2017-07-11 LAB — BASIC METABOLIC PANEL
ANION GAP: 10 (ref 5–15)
BUN: 9 mg/dL (ref 6–20)
CALCIUM: 9 mg/dL (ref 8.9–10.3)
CO2: 27 mmol/L (ref 22–32)
CREATININE: 0.83 mg/dL (ref 0.61–1.24)
Chloride: 100 mmol/L — ABNORMAL LOW (ref 101–111)
Glucose, Bld: 116 mg/dL — ABNORMAL HIGH (ref 65–99)
Potassium: 3.8 mmol/L (ref 3.5–5.1)
SODIUM: 137 mmol/L (ref 135–145)

## 2017-07-11 LAB — CBC
HCT: 42.3 % (ref 39.0–52.0)
HEMOGLOBIN: 14.5 g/dL (ref 13.0–17.0)
MCH: 31.5 pg (ref 26.0–34.0)
MCHC: 34.3 g/dL (ref 30.0–36.0)
MCV: 91.8 fL (ref 78.0–100.0)
PLATELETS: 132 10*3/uL — AB (ref 150–400)
RBC: 4.61 MIL/uL (ref 4.22–5.81)
RDW: 13.3 % (ref 11.5–15.5)
WBC: 9.7 10*3/uL (ref 4.0–10.5)

## 2017-07-11 LAB — I-STAT TROPONIN, ED: TROPONIN I, POC: 0 ng/mL (ref 0.00–0.08)

## 2017-07-11 MED ORDER — ACETAMINOPHEN 650 MG RE SUPP: 650.0000 mg | Freq: Four times a day (QID) | RECTAL | Status: DC | PRN

## 2017-07-11 MED ORDER — ACETAMINOPHEN 325 MG PO TABS
650.0000 mg | ORAL_TABLET | Freq: Four times a day (QID) | ORAL | Status: DC | PRN
  Administered 2017-07-12 – 2017-07-15 (×3): 650 mg via ORAL
  Filled 2017-07-11 (×3): qty 2

## 2017-07-11 MED ORDER — MORPHINE SULFATE (PF) 4 MG/ML IV SOLN
4.0000 mg | Freq: Once | INTRAVENOUS | Status: AC
  Administered 2017-07-11: 4 mg via INTRAVENOUS
  Filled 2017-07-11: qty 1

## 2017-07-11 MED ORDER — ONDANSETRON HCL 4 MG/2ML IJ SOLN
4.0000 mg | Freq: Once | INTRAMUSCULAR | Status: AC
  Administered 2017-07-11: 4 mg via INTRAVENOUS
  Filled 2017-07-11: qty 2

## 2017-07-11 MED ORDER — INSULIN ASPART 100 UNIT/ML ~~LOC~~ SOLN
0.0000 [IU] | Freq: Three times a day (TID) | SUBCUTANEOUS | Status: DC
  Administered 2017-07-12 – 2017-07-14 (×6): 1 [IU] via SUBCUTANEOUS

## 2017-07-11 MED ORDER — HEPARIN BOLUS VIA INFUSION
6000.0000 [IU] | Freq: Once | INTRAVENOUS | Status: AC
  Administered 2017-07-11: 6000 [IU] via INTRAVENOUS
  Filled 2017-07-11: qty 6000

## 2017-07-11 MED ORDER — ONDANSETRON HCL 4 MG PO TABS: 4.0000 mg | ORAL_TABLET | Freq: Four times a day (QID) | ORAL | Status: DC | PRN

## 2017-07-11 MED ORDER — IOPAMIDOL (ISOVUE-370) INJECTION 76%
INTRAVENOUS | Status: AC
  Administered 2017-07-11: 100 mL via INTRAVENOUS
  Filled 2017-07-11: qty 100

## 2017-07-11 MED ORDER — ASPIRIN EC 81 MG PO TBEC
81.0000 mg | DELAYED_RELEASE_TABLET | Freq: Every day | ORAL | Status: DC
  Administered 2017-07-12 – 2017-07-15 (×4): 81 mg via ORAL
  Filled 2017-07-11 (×4): qty 1

## 2017-07-11 MED ORDER — ONDANSETRON HCL 4 MG/2ML IJ SOLN: 4.0000 mg | Freq: Four times a day (QID) | INTRAMUSCULAR | Status: DC | PRN

## 2017-07-11 MED ORDER — HEPARIN (PORCINE) IN NACL 100-0.45 UNIT/ML-% IJ SOLN
1850.0000 [IU]/h | INTRAMUSCULAR | Status: DC
  Administered 2017-07-11 – 2017-07-12 (×2): 1800 [IU]/h via INTRAVENOUS
  Administered 2017-07-13: 1850 [IU]/h via INTRAVENOUS
  Filled 2017-07-11 (×3): qty 250

## 2017-07-11 MED ORDER — ATORVASTATIN CALCIUM 40 MG PO TABS
40.0000 mg | ORAL_TABLET | Freq: Every day | ORAL | Status: DC
  Administered 2017-07-12 – 2017-07-14 (×3): 40 mg via ORAL
  Filled 2017-07-11 (×3): qty 1

## 2017-07-11 NOTE — Progress Notes (Signed)
ANTICOAGULATION CONSULT NOTE - Initial Consult  Pharmacy Consult for Heparin Indication: pulmonary embolus  No Known Allergies  Patient Measurements: Weight: 220 lb 14.4 oz (100.2 kg)(From office visit in Sept 2018, needs to be re-weighed inpt)  IBW = 84.5 kg Heparin Dosing Weight: 100.2 kg  Vital Signs: Temp: 99.9 F (37.7 C) (11/26 1833) Temp Source: Oral (11/26 1833) BP: 135/85 (11/26 2000) Pulse Rate: 80 (11/26 2000)  Labs: Recent Labs    07/11/17 1841  HGB 14.5  HCT 42.3  PLT 132*  CREATININE 0.83    Estimated Creatinine Clearance: 120.2 mL/min (by C-G formula based on SCr of 0.83 mg/dL).   Medical History: Past Medical History:  Diagnosis Date  . Alcohol use disorder   . Cerebral vascular accident (HCC)   . 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 Diabetes mellitus type 2 in obese (HCC)   . HLD (hyperlipidemia)   . Morbid obesity (HCC)   . Seizures (HCC)   . Stroke (HCC)   . Tobacco abuse     Assessment: 56 year old male with history of DVT who was prescribed Xarelto but was not taking due to financial reasons (last dose 1 month ago) now in ED with bilateral PEs to start IV heparin per pharmacy dosing.   CBC within normal limits.  No bleeding noted.  Troponin negative.   Goal of Therapy:  Heparin level 0.3-0.7 units/ml Monitor platelets by anticoagulation protocol: Yes   Plan:  Heparin bolus 6000 units x1 Heparin infusion at 1800 units/hr Heparin level in 6 hours Daily heparin level and CBC. Follow-up for updated weight  Link SnufferJessica Jennafer Gladue, PharmD, BCPS, BCCCP Clinical Pharmacist Clinical phone 07/11/2017 until 11PM [phone] After hours, please call #[phone] 07/11/2017,10:38 PM

## 2017-07-11 NOTE — ED Notes (Signed)
Patient transported to X-ray 

## 2017-07-11 NOTE — H&P (Addendum)
History and Physical    Yandell Mcjunkins [facility] ZOX:[id number] DOB: Nov 26, 1960 DOA: 07/11/2017  PCP: Loletta Specter, PA-C  Patient coming from: Home.  Chief Complaint: Chest pain.  HPI: Ronald Forbes is a 56 y.o. 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  Patient has been having left-sided pleuritic type of chest pain for last 2 weeks.  Patient is to take Xarelto for his DVT which he has not been taking for last 1 month due to financial issues.  Patient states only medication he has been taking of his Keppra.  Has history of alcoholism but states he has not had alcohol for many months now.  Denies any shortness of breath nausea vomiting abdominal pain or diarrhea.  ED Course: In the ER EKG shows normal sinus rhythm with nonspecific findings and troponin was negative.  CT angiogram of the chest shows bilateral lower segment pulmonary embolus with left-sided pulmonary infarct.  No strain pattern.  Patient was placed on heparin and admitted for further observation.  Review of Systems: As per HPI, rest all negative.   Past Medical History:  Diagnosis Date  . Alcohol use disorder   . Cerebral vascular accident (HCC)   . 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 Diabetes mellitus type 2 in obese (HCC)   . HLD (hyperlipidemia)   . Morbid obesity (HCC)   . Seizures (HCC)   . Stroke (HCC)   . Tobacco abuse     Past Surgical History:  Procedure Laterality Date  . ABDOMINAL AORTOGRAM W/LOWER EXTREMITY Right 10/20/2016   Procedure: Abdominal Aortogram w/Lower Extremity;  Surgeon: Maeola Harman, MD;  [facility] INVASIVE CV LAB;  Service: Cardiovascular;  Laterality: Right;  lower leg  . AMPUTATION Right 11/15/2016   Procedure: AMPUTATION ABOVE KNEE;   Surgeon: Sherren Kerns, MD;  Location: [facility] OR;  Service: Vascular;  Laterality: Right;  . EP IMPLANTABLE DEVICE N/A 02/09/2016   Procedure: Loop Recorder Insertion;  Surgeon: Duke Salvia, MD;  Location: [facility] INVASIVE CV LAB;  Service: Cardiovascular;  Laterality: N/A;  . FEMORAL-TIBIAL BYPASS GRAFT Right 10/25/2016   Procedure: BYPASS GRAFT FEMORAL-TIBIAL ARTERY USING NON REVERSED SAPPHENOUS VEIN;  Surgeon: Sherren Kerns, MD;  Location: [facility] OR;  Service: Vascular;  Laterality: Right;  . INTRAOPERATIVE ARTERIOGRAM Right 10/25/2016   Procedure: INTRA OPERATIVE ARTERIOGRAM;  Surgeon: Sherren Kerns, MD;  Location: [facility] OR;  Service: Vascular;  Laterality: Right;  . TEE WITHOUT CARDIOVERSION N/A 02/06/2016   Procedure: TRANSESOPHAGEAL ECHOCARDIOGRAM (TEE);  Surgeon: Wendall Stade, MD;  Location: [facility] ENDOSCOPY;  Service: Cardiovascular;  Laterality: N/A;     reports that he has been smoking cigarettes.  He has a 20.00 pack-year smoking history. he has never used smokeless tobacco. He reports that he uses drugs. Drug: Marijuana. Frequency: 1.00 time per week. He reports that he does not drink alcohol.  No Known Allergies  Family History  Problem Relation Age of Onset  . Hypertension Mother     Prior to Admission medications   Medication Sig Start Date End Date Taking? Authorizing Provider  aspirin EC 81 MG tablet Take 81 mg by mouth daily.   Yes [provider]  atorvastatin (LIPITOR) 40 MG tablet Take 1 tablet (40 mg total) by mouth daily at 6 PM. 04/22/17  Yes  Loletta SpecterGomez, Roger David, PA-C  levETIRAcetam (KEPPRA) 500 MG tablet Take 1 tablet (500 mg total) by mouth 2 (two) times daily. 04/22/17  Yes Loletta SpecterGomez, Roger David, PA-C  rivaroxaban (XARELTO) 20 MG TABS tablet Take 1 tablet (20 mg total) by mouth daily with supper. 03/11/17  Yes Loletta SpecterGomez, Roger David, PA-C    Physical Exam: Vitals:   07/11/17 2000 07/11/17 2220 07/11/17 2230 07/11/17 2300  BP: 135/85  126/80 130/88  Pulse: 80  89 86    Resp: 19  (!) 22 (!) 29  Temp:      TempSrc:      SpO2: 98%  96% 96%  Weight:  100.2 kg (220 lb 14.4 oz)        Constitutional: Moderately built and nourished. Vitals:   07/11/17 2000 07/11/17 2220 07/11/17 2230 07/11/17 2300  BP: 135/85  126/80 130/88  Pulse: 80  89 86  Resp: 19  (!) 22 (!) 29  Temp:      TempSrc:      SpO2: 98%  96% 96%  Weight:  100.2 kg (220 lb 14.4 oz)     Eyes: Anicteric no pallor. ENMT: No discharge from the ears eyes nose or mouth. Neck: No mass felt.  No neck rigidity.  No JVD appreciated. Respiratory: No rhonchi or crepitations. Cardiovascular: S1-S2 heard no murmurs appreciated. Abdomen: Soft nontender bowel sounds present. Musculoskeletal: No edema.  No joint effusion.  Right AKA. Skin: No rash. Neurologic: Alert awake oriented to time place and person.  Right-sided hemiplegia. Psychiatric: Appears normal.  Normal affect.   Labs on Admission: I have personally reviewed following labs and imaging studies  CBC: Recent Labs  Lab 07/11/17 1841  WBC 9.7  HGB 14.5  HCT 42.3  MCV 91.8  PLT 132*   Basic Metabolic Panel: Recent Labs  Lab 07/11/17 1841  NA 137  K 3.8  CL 100*  CO2 27  GLUCOSE 116*  BUN 9  CREATININE 0.83  CALCIUM 9.0   GFR: Estimated Creatinine Clearance: 120.2 mL/min (by C-G formula based on SCr of 0.83 mg/dL). Liver Function Tests: No results for input(s): AST, ALT, ALKPHOS, BILITOT, PROT, ALBUMIN in the last 168 hours. No results for input(s): LIPASE, AMYLASE in the last 168 hours. No results for input(s): AMMONIA in the last 168 hours. Coagulation Profile: No results for input(s): INR, PROTIME in the last 168 hours. Cardiac Enzymes: No results for input(s): CKTOTAL, CKMB, CKMBINDEX, TROPONINI in the last 168 hours. BNP (last 3 results) No results for input(s): PROBNP in the last 8760 hours. HbA1C: No results for input(s): HGBA1C in the last 72 hours. CBG: No results for input(s): GLUCAP in the last 168  hours. Lipid Profile: No results for input(s): CHOL, HDL, LDLCALC, TRIG, CHOLHDL, LDLDIRECT in the last 72 hours. Thyroid Function Tests: No results for input(s): TSH, T4TOTAL, FREET4, T3FREE, THYROIDAB in the last 72 hours. Anemia Panel: No results for input(s): VITAMINB12, FOLATE, FERRITIN, TIBC, IRON, RETICCTPCT in the last 72 hours. Urine analysis:    Component Value Date/Time   COLORURINE YELLOW 01/25/2017 1809   APPEARANCEUR CLEAR 01/25/2017 1809   LABSPEC 1.021 01/25/2017 1809   PHURINE 5.0 01/25/2017 1809   GLUCOSEU NEGATIVE 01/25/2017 1809   HGBUR NEGATIVE 01/25/2017 1809   BILIRUBINUR NEGATIVE 01/25/2017 1809   KETONESUR NEGATIVE 01/25/2017 1809   PROTEINUR 30 (A) 01/25/2017 1809   NITRITE NEGATIVE 01/25/2017 1809   LEUKOCYTESUR NEGATIVE 01/25/2017 1809   Sepsis Labs: @LABRCNTIP (procalcitonin:4,lacticidven:4) )No results found for this or any previous visit (  from the past 240 hour(s)).   Radiological Exams on Admission: Dg Chest 2 View  Result Date: 07/11/2017 CLINICAL DATA:  Initial evaluation for acute central chest pain. EXAM: CHEST  2 VIEW COMPARISON:  Prior radiograph from 01/24/2017. FINDINGS: Mild cardiomegaly, stable. Mediastinal silhouette within normal limits. Lungs are hypoinflated. Mild patchy and linear left basilar opacity, favored to reflect atelectasis. No other consolidative opacity. No pulmonary edema for pleural effusion. No pneumothorax. No acute osseus abnormality. No acute osseus abnormality. IMPRESSION: 1. Shallow lung inflation with mild left basilar atelectasis. 2. No other active cardiopulmonary disease. Electronically Signed   By: Rise Mu M.D.   On: 07/11/2017 19:24   Ct Angio Chest Pe W And/or Wo Contrast  Result Date: 07/11/2017 CLINICAL DATA:  Chest pressure starting 2 weeks ago, present intermittently. Symptoms became persistent 2 days ago. EXAM: CT ANGIOGRAPHY CHEST WITH CONTRAST TECHNIQUE: Multidetector CT imaging of the chest  was performed using the standard protocol during bolus administration of intravenous contrast. Multiplanar CT image reconstructions and MIPs were obtained to evaluate the vascular anatomy. CONTRAST:  ISOVUE-370 IOPAMIDOL (ISOVUE-370) INJECTION 76% COMPARISON:  None. FINDINGS: Cardiovascular: Filling defects within the bilateral lower lobe pulmonary arteries, branching into bilateral lower lobe segmental vessels with occlusive clot seen bilaterally. The has both central and eccentric appearance compatible with acute to subacute historical timing. RV to LV ratio is upper normal at 0.9. This ratio is likely accentuated by hpertrophic appearance of the left ventricle. No acute finding in the faintly opacified aorta. No pericardial effusion. Mediastinum/Nodes: 2.5 cm right thyroid nodule. Although difficult to visualize today there is a definite nodule based on neck CTA 02/04/2016, with similar dimensions to prior. Lungs/Pleura: Asymmetric ground-glass density and reticulation in the left lower lobe which is considered ischemic. Lung volumes are low and there is bilateral subsegmental atelectasis. Trace pleural fluid along the left lower lobe. Upper Abdomen: No acute finding. Musculoskeletal: No acute or aggressive finding. Critical Value/emergent results were called by telephone at the time of interpretation on 07/11/2017 at 10:28 pm to Dr. Shirlyn Goltz , who verbally acknowledged these results. Review of the MIP images confirms the above findings. IMPRESSION: 1. Positive for acute to subacute pulmonary embolism in the bilateral lower lobar and segmental arteries. Infarct in the left lower lobe. Negative for right heart strain. 2. Hypertrophic appearance of the left ventricle. 3. 2.5 cm right thyroid nodule, also noted on neck CTA 02/04/2016. Sonography is recommended if not already performed. Electronically Signed   By: Marnee Spring M.D.   On: 07/11/2017 22:32    EKG: Independently reviewed.  Normal  sinus rhythm with LVH.  Nonspecific ST-T changes.  Assessment/Plan Principal Problem:   Acute pulmonary embolism (HCC) Active Problems:   Morbid obesity (HCC)   Diabetes mellitus type 2 in obese [facility])   Thyroid nodule   PAD (peripheral artery disease) (HCC)   Unilateral AKA, right (HCC)   History of CVA with residual deficit   Seizures (HCC)   Alcohol abuse   Pulmonary embolism (HCC)    1. Acute bilateral pulmonary embolism hemodynamically stable unprovoked -with previous history of DVT.  Patient is noncompliant with Xarelto due to financial issues.  Patient presently on heparin.  Will check cardiac markers and 2D echo.  As needed pain relief medications due to pulmonary infarct. 2. Thyroid nodule -we will check TSH and based on that we will consider ordering free T4 and T3 if TSH is low.  Will need further workup on this. 3. History of  seizures on Keppra. 4. Diabetes mellitus type 2 presently not on medications.  I have placed patient on CBG coverage with sensitive sliding scale and check hemoglobin A1c. 5. History of hypertension presently on no medications closely follow blood pressure trends. 6. History of alcoholism patient states he has not been drinking recently for many months. 7. Mild thrombocytopenia follow CBC. 8. Tobacco abuse patient was advised to quit smoking.  I have reviewed patient's old charts and labs.   DVT prophylaxis: Heparin. Code Status: Full code. Family Communication: Discussed with patient. Disposition Plan: Home. Consults called: Case manager consult for medications. Admission status: Observation.   Eduard ClosArshad N Yvana Samonte MD [facility] Pager [phone].  If 7PM-7AM, please contact night-coverage [email or web address] Password [facility] [email or web address]  07/11/2017, 11:55 PM

## 2017-07-11 NOTE — ED Provider Notes (Signed)
[facility] Provider Note   CSN: [id number] Arrival date & time: 07/11/17  1826     History   Chief Complaint Chief Complaint  Patient presents with  . Chest Pain    HPI Ronald Forbes is a 56 y.o. male.  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  Patient states that he has had 8/10 severity constant, "dull" substernal and left-sided chest pain for about 2 weeks now.  The pain radiates to the left lower rib cage.  Endorses worsening of the pain with taking a deep breath.  He states that he has some shortness of breath as well.  Denies nausea/vomiting, dizziness, radiation to the left arm or jaw, abdominal pain, numbness, weakness, fever, cough.  Denies history of exertional chest pain or ACS.  States that he has an ICD in place, but he is unsure of why.  Patient states that he has not taken his Xarelto for about a month now due to cost issues.  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  Denies current leg swelling, calf pain.  Per chart review, patient had an echocardiogram 01/2016 with EF 55-60%.  No wall motion abnormalities.   Past Medical History:  Diagnosis Date  . Alcohol use disorder   . Cerebral vascular accident (HCC)   . 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 Diabetes mellitus type 2 in obese (HCC)   . HLD (hyperlipidemia)   . Morbid obesity (HCC)   . Seizures (HCC)   . Stroke (HCC)   . Tobacco abuse     Patient Active Problem List   Diagnosis Date Noted  . Seizure (HCC) 01/25/2017  . Depression 01/25/2017  . DVT (deep venous thrombosis) (HCC) 01/25/2017  .  Seizures (HCC) 01/25/2017  . Hemiparesis affecting right side as late effect of cerebrovascular accident (CVA) (HCC) 12/07/2016  . Type 2 diabetes mellitus with diabetic peripheral angiopathy without gangrene, without long-term current use of insulin (HCC)   . 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 History of CVA with residual deficit   . Phantom limb pain (HCC)   . 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 Unilateral AKA, right (HCC)   . Diabetic peripheral neuropathy (HCC)   . Diabetes mellitus type 2 in nonobese (HCC)   . Reactive hypertension   . Type 2 diabetes mellitus with peripheral neuropathy (HCC)   . Benign essential HTN   . Hypokalemia   . Hypoalbuminemia due to protein-calorie malnutrition (HCC)   . Acute blood loss anemia   . Debilitated 10/28/2016  . Ischemia of right lower extremity 10/25/2016  . PAD (peripheral artery disease) (HCC) 10/20/2016  . Thyroid nodule 02/29/2016  . 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 Cerebrovascular accident (CVA) due to embolism of left posterior cerebral artery (HCC)   . Diabetes mellitus type 2 in obese (HCC) 02/07/2016  . HLD (hyperlipidemia)   . Tobacco abuse 02/04/2016  . Alcohol use disorder 02/04/2016  . Morbid obesity (HCC) 02/04/2016  . CVA (cerebral vascular accident) (HCC) 02/04/2016    Past Surgical History:  Procedure Laterality Date  . ABDOMINAL  AORTOGRAM W/LOWER EXTREMITY Right 10/20/2016   Procedure: Abdominal Aortogram w/Lower Extremity;  Surgeon: Maeola Harman, MD;  Location: [facility] INVASIVE CV LAB;  Service: Cardiovascular;  Laterality: Right;  lower leg  . AMPUTATION Right 11/15/2016   Procedure: AMPUTATION ABOVE KNEE;  Surgeon: Sherren Kerns, MD;  Location: [facility] OR;  Service: Vascular;  Laterality: Right;  . EP IMPLANTABLE DEVICE N/A 02/09/2016   Procedure: Loop Recorder Insertion;  Surgeon: Duke Salvia, MD;  Location: [facility] INVASIVE CV LAB;  Service:  Cardiovascular;  Laterality: N/A;  . FEMORAL-TIBIAL BYPASS GRAFT Right 10/25/2016   Procedure: BYPASS GRAFT FEMORAL-TIBIAL ARTERY USING NON REVERSED SAPPHENOUS VEIN;  Surgeon: Sherren Kerns, MD;  Location: [facility] OR;  Service: Vascular;  Laterality: Right;  . INTRAOPERATIVE ARTERIOGRAM Right 10/25/2016   Procedure: INTRA OPERATIVE ARTERIOGRAM;  Surgeon: Sherren Kerns, MD;  Location: [facility] OR;  Service: Vascular;  Laterality: Right;  . TEE WITHOUT CARDIOVERSION N/A 02/06/2016   Procedure: TRANSESOPHAGEAL ECHOCARDIOGRAM (TEE);  Surgeon: Wendall Stade, MD;  Location: [facility] ENDOSCOPY;  Service: Cardiovascular;  Laterality: N/A;       Home Medications    Prior to Admission medications   Medication Sig Start Date End Date Taking? Authorizing Provider  atorvastatin (LIPITOR) 40 MG tablet Take 1 tablet (40 mg total) by mouth daily at 6 PM. 04/22/17   Loletta Specter, PA-C  levETIRAcetam (KEPPRA) 500 MG tablet Take 1 tablet (500 mg total) by mouth 2 (two) times daily. 04/22/17   Loletta Specter, PA-C  rivaroxaban (XARELTO) 20 MG TABS tablet Take 1 tablet (20 mg total) by mouth daily with supper. 03/11/17   Loletta Specter, PA-C    Family History Family History  Problem Relation Age of Onset  . Hypertension Mother     Social History Social History   Tobacco Use  . Smoking status: Current Every Day Smoker    Packs/day: 0.50    Years: 40.00    Pack years: 20.00    Types: Cigarettes  . Smokeless tobacco: Never Used  . Tobacco comment: Less than 1 pk per day  Substance Use Topics  . Alcohol use: No    Alcohol/week: 0.0 oz    Frequency: Never    Comment: 6 pack/day (past)  . Drug use: Yes    Frequency: 1.0 times per week    Types: Marijuana    Comment: occasional marijuana     Allergies   Patient has no known allergies.   Review of Systems Review of Systems  Constitutional: Negative for chills, fatigue and fever.  HENT: Negative for congestion.   Eyes: Negative for visual  disturbance.  Respiratory: Positive for shortness of breath. Negative for cough.   Cardiovascular: Positive for chest pain. Negative for palpitations and leg swelling.  Gastrointestinal: Negative for abdominal pain, diarrhea, nausea and vomiting.  Genitourinary: Negative for difficulty urinating and dysuria.  Musculoskeletal: Negative for back pain and gait problem.  Skin: Negative for wound.  Neurological: Negative for dizziness, weakness, light-headedness, numbness and headaches.  Psychiatric/Behavioral: Negative for agitation.     Physical Exam Updated Vital Signs BP 126/83 (BP Location: Right Arm)   Pulse 80   Temp 99.9 F (37.7 C) (Oral)   Resp 19   SpO2 97%   Physical Exam  Constitutional: He appears well-developed and well-nourished.  Non-toxic appearance. He does not appear ill. No distress.  Sitting comfortably at the bedside.  HENT:  Head: Normocephalic and atraumatic.  Eyes: Pupils are equal, round, and reactive to light.  Right eye exhibits no discharge. Left eye exhibits no discharge.  Neck: Normal range of motion. Neck supple. No JVD present. No tracheal deviation present.  Cardiovascular: Normal rate, regular rhythm, intact distal pulses and normal pulses.    No systolic murmur is present.  No diastolic murmur is present. Pulmonary/Chest: Effort normal and breath sounds normal. No accessory muscle usage or stridor. Tachypnea noted. No respiratory distress.  No chest tenderness.  Abdominal: Soft. Bowel sounds are normal. He exhibits no distension and no mass. There is no tenderness. There is no guarding.  Musculoskeletal: Normal range of motion.  Right above-the-knee amputation.  No calf tenderness of the left leg.  No left leg swelling.  DP pulse 2+.  Capillary refill less than 2 seconds.  Lymphadenopathy:    He has no cervical adenopathy.  Neurological: He is alert. Coordination normal.  Skin: Skin is warm and dry. Capillary refill takes less than 2 seconds. He  is not diaphoretic.  Psychiatric: He has a normal mood and affect. His behavior is normal.  Nursing note and vitals reviewed.    ED Treatments / Results  Labs (all labs ordered are listed, but only abnormal results are displayed) Labs Reviewed  BASIC METABOLIC PANEL - Abnormal; Notable for the following components:      Result Value   Chloride 100 (*)    Glucose, Bld 116 (*)    All other components within normal limits  CBC - Abnormal; Notable for the following components:   Platelets 132 (*)    All other components within normal limits  I-STAT TROPONIN, ED    EKG  EKG Interpretation None       Radiology Dg Chest 2 View  Result Date: 07/11/2017 CLINICAL DATA:  Initial evaluation for acute central chest pain. EXAM: CHEST  2 VIEW COMPARISON:  Prior radiograph from 01/24/2017. FINDINGS: Mild cardiomegaly, stable. Mediastinal silhouette within normal limits. Lungs are hypoinflated. Mild patchy and linear left basilar opacity, favored to reflect atelectasis. No other consolidative opacity. No pulmonary edema for pleural effusion. No pneumothorax. No acute osseus abnormality. No acute osseus abnormality. IMPRESSION: 1. Shallow lung inflation with mild left basilar atelectasis. 2. No other active cardiopulmonary disease. Electronically Signed   By: Rise MuBenjamin  McClintock M.D.   On: 07/11/2017 19:24    Procedures Procedures (including critical care time)  Medications Ordered in ED Medications - No data to display   Initial Impression / Assessment and Plan / ED Course  I have reviewed the triage vital signs and the nursing notes.  Pertinent labs & imaging results that were available during my care of the patient were reviewed by me and considered in my medical decision making (see chart for details).    CT angiogram reveals bilateral pulmonary emboli in lower lobar and segmental arteries with left lower lobe infarct.  No right heart strain.  Patient started on heparin in the  emergency department.  Vital signs stable, he is in no respiratory distress.  CBC and BMP unremarkable.  Troponin negative.  EKG nonischemic.  Hospitalist Dr. Toniann FailKakrakandy will admit patient to medicine for pulmonary embolus.  Final Clinical Impressions(s) / ED Diagnoses   Final diagnoses:  Other acute pulmonary embolism without acute [facility] pul[facility](HCC)    ED Discharge Orders    None       Lawrence MarseillesShrosbree, Aivy Akter J, PA-C 07/12/17 1626    Donnetta Hutchingook, Brian, MD 07/15/17 2050

## 2017-07-11 NOTE — ED Notes (Signed)
Patient transported to CT 

## 2017-07-11 NOTE — ED Triage Notes (Signed)
Per [facility], Pt from home. Pt complains of chest pressure, starting 2 weeks ago intermittently. Pt reports pressure became constant a 2 days ago. Pt denies nausea, vomiting, SHOB, dizziness. Pt also complaining of abdominal pain. Pt reports a defibrillator/pacemaker placed 1 year ago, unsure of reason why it was placed. Pt received 324 ASA en route.

## 2017-07-12 ENCOUNTER — Observation Stay (HOSPITAL_BASED_OUTPATIENT_CLINIC_OR_DEPARTMENT_OTHER): Payer: Medicaid Other

## 2017-07-12 DIAGNOSIS — E1151 Type 2 diabetes mellitus with diabetic peripheral angiopathy without gangrene: Secondary | ICD-10-CM | POA: Diagnosis present

## 2017-07-12 DIAGNOSIS — Z86718 Personal history of other venous thrombosis and embolism: Secondary | ICD-10-CM | POA: Diagnosis not present

## 2017-07-12 DIAGNOSIS — Z7901 Long term (current) use of anticoagulants: Secondary | ICD-10-CM | POA: Diagnosis not present

## 2017-07-12 DIAGNOSIS — Z599 Problem related to housing and economic circumstances, unspecified: Secondary | ICD-10-CM | POA: Diagnosis not present

## 2017-07-12 DIAGNOSIS — I69351 Hemiplegia and hemiparesis following cerebral infarction affecting right dominant side: Secondary | ICD-10-CM | POA: Diagnosis not present

## 2017-07-12 DIAGNOSIS — E1169 Type 2 diabetes mellitus with other specified complication: Secondary | ICD-10-CM | POA: Diagnosis not present

## 2017-07-12 DIAGNOSIS — K5900 Constipation, unspecified: Secondary | ICD-10-CM | POA: Diagnosis present

## 2017-07-12 DIAGNOSIS — I10 Essential (primary) hypertension: Secondary | ICD-10-CM | POA: Diagnosis present

## 2017-07-12 DIAGNOSIS — Z79899 Other long term (current) drug therapy: Secondary | ICD-10-CM | POA: Diagnosis not present

## 2017-07-12 DIAGNOSIS — G40909 Epilepsy, unspecified, not intractable, without status epilepticus: Secondary | ICD-10-CM | POA: Diagnosis present

## 2017-07-12 DIAGNOSIS — R109 Unspecified abdominal pain: Secondary | ICD-10-CM | POA: Diagnosis not present

## 2017-07-12 DIAGNOSIS — D696 Thrombocytopenia, unspecified: Secondary | ICD-10-CM | POA: Diagnosis present

## 2017-07-12 DIAGNOSIS — R079 Chest pain, unspecified: Secondary | ICD-10-CM | POA: Diagnosis present

## 2017-07-12 DIAGNOSIS — Z7982 Long term (current) use of aspirin: Secondary | ICD-10-CM | POA: Diagnosis not present

## 2017-07-12 DIAGNOSIS — I361 Nonrheumatic tricuspid (valve) insufficiency: Secondary | ICD-10-CM

## 2017-07-12 DIAGNOSIS — F17210 Nicotine dependence, cigarettes, uncomplicated: Secondary | ICD-10-CM | POA: Diagnosis present

## 2017-07-12 DIAGNOSIS — E785 Hyperlipidemia, unspecified: Secondary | ICD-10-CM | POA: Diagnosis present

## 2017-07-12 DIAGNOSIS — Z89611 Acquired absence of right leg above knee: Secondary | ICD-10-CM | POA: Diagnosis not present

## 2017-07-12 DIAGNOSIS — Z9114 Patient's other noncompliance with medication regimen: Secondary | ICD-10-CM | POA: Diagnosis not present

## 2017-07-12 DIAGNOSIS — E041 Nontoxic single thyroid nodule: Secondary | ICD-10-CM | POA: Diagnosis not present

## 2017-07-12 DIAGNOSIS — F1010 Alcohol abuse, uncomplicated: Secondary | ICD-10-CM | POA: Diagnosis present

## 2017-07-12 DIAGNOSIS — L8990 Pressure ulcer of unspecified site, unspecified stage: Secondary | ICD-10-CM

## 2017-07-12 DIAGNOSIS — I2699 Other pulmonary embolism without acute cor pulmonale: Secondary | ICD-10-CM | POA: Diagnosis present

## 2017-07-12 DIAGNOSIS — E042 Nontoxic multinodular goiter: Secondary | ICD-10-CM | POA: Diagnosis present

## 2017-07-12 DIAGNOSIS — E669 Obesity, unspecified: Secondary | ICD-10-CM | POA: Diagnosis not present

## 2017-07-12 DIAGNOSIS — R509 Fever, unspecified: Secondary | ICD-10-CM | POA: Diagnosis not present

## 2017-07-12 LAB — ECHOCARDIOGRAM COMPLETE
Height: 75 in
Weight: 3767.22 oz

## 2017-07-12 LAB — GLUCOSE, CAPILLARY
GLUCOSE-CAPILLARY: 128 mg/dL — AB (ref 65–99)
Glucose-Capillary: 126 mg/dL — ABNORMAL HIGH (ref 65–99)
Glucose-Capillary: 114 mg/dL — ABNORMAL HIGH (ref 65–99)
Glucose-Capillary: 162 mg/dL — ABNORMAL HIGH (ref 65–99)

## 2017-07-12 LAB — CBC
HCT: 39.4 % (ref 39.0–52.0)
Hemoglobin: 13.9 g/dL (ref 13.0–17.0)
MCH: 31.7 pg (ref 26.0–34.0)
MCHC: 35.3 g/dL (ref 30.0–36.0)
MCV: 90 fL (ref 78.0–100.0)
PLATELETS: 142 10*3/uL — AB (ref 150–400)
RBC: 4.38 MIL/uL (ref 4.22–5.81)
RDW: 13 % (ref 11.5–15.5)
WBC: 10.5 10*3/uL (ref 4.0–10.5)

## 2017-07-12 LAB — TROPONIN I
Troponin I: <0.03 ng/mL (ref <0.03)
Troponin I: <0.03 ng/mL (ref <0.03)

## 2017-07-12 LAB — HEMOGLOBIN A1C
HEMOGLOBIN A1C: 5.5 % (ref 4.8–5.6)
MEAN PLASMA GLUCOSE: 111.15 mg/dL

## 2017-07-12 LAB — HIV ANTIBODY (ROUTINE TESTING W REFLEX): HIV Screen 4th Generation wRfx: NONREACTIVE

## 2017-07-12 LAB — BASIC METABOLIC PANEL
Anion gap: 11 (ref 5–15)
BUN: 9 mg/dL (ref 6–20)
CHLORIDE: 101 mmol/L (ref 101–111)
CO2: 22 mmol/L (ref 22–32)
CREATININE: 0.79 mg/dL (ref 0.61–1.24)
Calcium: 8.6 mg/dL — ABNORMAL LOW (ref 8.9–10.3)
GFR calc Af Amer: >60 mL/min (ref >60)
GFR calc non Af Amer: >60 mL/min (ref >60)
Glucose, Bld: 123 mg/dL — ABNORMAL HIGH (ref 65–99)
POTASSIUM: 3.8 mmol/L (ref 3.5–5.1)
Sodium: 134 mmol/L — ABNORMAL LOW (ref 135–145)

## 2017-07-12 LAB — HEPARIN LEVEL (UNFRACTIONATED)
HEPARIN UNFRACTIONATED: 0.53 [IU]/mL (ref 0.30–0.70)
HEPARIN UNFRACTIONATED: 0.32 [IU]/mL (ref 0.30–0.70)

## 2017-07-12 LAB — MRSA PCR SCREENING: MRSA BY PCR: NEGATIVE

## 2017-07-12 LAB — TSH: TSH: <0.01 u[IU]/mL — ABNORMAL LOW (ref 0.350–4.500)

## 2017-07-12 LAB — T4, FREE: Free T4: 1.32 ng/dL — ABNORMAL HIGH (ref 0.61–1.12)

## 2017-07-12 MED ORDER — MORPHINE SULFATE (PF) 2 MG/ML IV SOLN
2.0000 mg | Freq: Once | INTRAVENOUS | Status: AC
  Administered 2017-07-12: 2 mg via INTRAVENOUS

## 2017-07-12 MED ORDER — MORPHINE SULFATE (PF) 4 MG/ML IV SOLN
2.0000 mg | INTRAVENOUS | Status: DC | PRN
  Administered 2017-07-13 – 2017-07-14 (×6): 2 mg via INTRAVENOUS
  Filled 2017-07-12 (×7): qty 1

## 2017-07-12 MED ORDER — OXYCODONE HCL 5 MG PO TABS
5.0000 mg | ORAL_TABLET | Freq: Four times a day (QID) | ORAL | Status: DC | PRN
  Administered 2017-07-12 – 2017-07-15 (×9): 5 mg via ORAL
  Filled 2017-07-12 (×9): qty 1

## 2017-07-12 MED ORDER — MORPHINE SULFATE (PF) 4 MG/ML IV SOLN
2.0000 mg | INTRAVENOUS | Status: DC | PRN
  Administered 2017-07-12 (×4): 2 mg via INTRAVENOUS
  Filled 2017-07-12 (×4): qty 1

## 2017-07-12 MED ORDER — LEVETIRACETAM 500 MG PO TABS
500.0000 mg | ORAL_TABLET | Freq: Two times a day (BID) | ORAL | Status: DC
  Administered 2017-07-12 – 2017-07-15 (×7): 500 mg via ORAL
  Filled 2017-07-12 (×7): qty 1

## 2017-07-12 NOTE — Progress Notes (Signed)
ANTICOAGULATION CONSULT NOTE   Pharmacy Consult for Heparin Indication: pulmonary embolus  No Known Allergies  Patient Measurements: Height: 6\' 3"  (190.5 cm) Weight: 235 lb 7.2 oz (106.8 kg) IBW/kg (Calculated) : 84.5  IBW = 84.5 kg Heparin Dosing Weight: 100.2 kg  Vital Signs: Temp: 99.6 F (37.6 C) (11/27 0255) Temp Source: Oral (11/27 0255) BP: 130/97 (11/27 0255) Pulse Rate: 75 (11/27 0313)  Labs: Recent Labs    07/11/17 1841 07/12/17 0044 07/12/17 0405  HGB 14.5  --  13.9  HCT 42.3  --  39.4  PLT 132*  --  142*  HEPARINUNFRC  --   --  0.53  CREATININE 0.83  --   --   TROPONINI  --  <0.03  --     Estimated Creatinine Clearance: 132.8 mL/min (by C-G formula based on SCr of 0.83 mg/dL).   Medical History: Past Medical History:  Diagnosis Date  . Alcohol use disorder   . Cerebral vascular accident (HCC)   . 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 Diabetes mellitus type 2 in obese (HCC)   . HLD (hyperlipidemia)   . Morbid obesity (HCC)   . Seizures (HCC)   . Stroke (HCC)   . Tobacco abuse     Assessment: 56 year old male with history of DVT who was prescribed Xarelto but was not taking due to financial reasons (last dose 1 month ago) now in ED with bilateral PEs to start IV heparin per pharmacy dosing.   Initial heparin level is therapeutic this AM  Goal of Therapy:  Heparin level 0.3-0.7 units/ml Monitor platelets by anticoagulation protocol: Yes   Plan:  Cont Heparin infusion at 1800 units/hr Heparin level in 6 hours  Abran DukeJames Kobee Medlen, PharmD, BCPS Clinical Pharmacist Phone: [phone]

## 2017-07-12 NOTE — Care Management Note (Signed)
Case Management Note  Patient Details  Name: Lindwood CokeKevin Van Dunk MRN: [id number] Date of Birth: 08/04/1961  Subjective/Objective:         Pt presented for PE after being off of meds for unknown period of time.  Pt unable to give details including phone numbers or specifics of medical management and asks CM to call sister, Annice PihJackie.  Pt lives with Annice PihJackie and reports that he has prosthesis, W/C, and walker.  Pt states he has Medicaid and is on Disability.      Action/Plan: Attempted to call sister, Annice PihJackie, but phone number not in service.  Rhunette CroftReached Jackie at [phone], which is reportedly patient's phone.  Annice PihJackie also gave number [phone].  Pt's sister-in-law is Windell MouldingRuth at [phone] but is not involved in pt's care.    Annice PihJackie states that pt receives Disability check and they have been filling Keppra prescriptions to prevent seizures but no others.  Discussed with Annice PihJackie that this hospitalization is a direct result of pt being off of Xarelto.  Annice PihJackie states that she lets pt decide how to spend his money and that she didn't realize that it was that important.  She states they can start to fill Lipitor, Keppra, and Xarelto.  Annice PihJackie states she can drive to get medications at pharmacy.  Annice PihJackie also states that pt has access to cigarettes and nursing staff reports that pt habitually uses ETOH.    Discussed with CSW, Darl PikesSusan regarding APS referral.  Darl PikesSusan and Steward DroneBrenda, CM will f/u tomorrow.   Pt not eligible for [facility] because no [place].  Expected Discharge Date:                  Expected Discharge Plan:  Home/Self Care  In-House Referral:  NA  Discharge planning Services  CM Consult, Medication Assistance  Post Acute Care Choice:  NA Choice offered to:     DME Arranged:    DME Agency:     HH Arranged:    HH Agency:     Status of Service:     If discussed at [employer] Length of Stay Meetings, dates discussed:    Additional Comments:  Verdene LennertGoldean, Iniko Robles K, RN 07/12/2017, 12:36 PM

## 2017-07-12 NOTE — Progress Notes (Signed)
ANTICOAGULATION CONSULT NOTE   Pharmacy Consult for Heparin Indication: pulmonary embolus  No Known Allergies  Patient Measurements: Height: 6\' 3"  (190.5 cm) Weight: 235 lb 7.2 oz (106.8 kg) IBW/kg (Calculated) : 84.5  IBW = 84.5 kg Heparin Dosing Weight: 100.2 kg  Vital Signs: Temp: 100.4 F (38 C) (11/27 1206) Temp Source: Axillary (11/27 1206) BP: 132/88 (11/27 1206) Pulse Rate: 78 (11/27 1206)  Labs: Recent Labs    07/11/17 1841 07/12/17 0044 07/12/17 0405 07/12/17 1052  HGB 14.5  --  13.9  --   HCT 42.3  --  39.4  --   PLT 132*  --  142*  --   HEPARINUNFRC  --   --  0.53 0.32  CREATININE 0.83  --  0.79  --   TROPONINI  --  <0.03 <0.03 <0.03    Estimated Creatinine Clearance: 137.8 mL/min (by C-G formula based on SCr of 0.79 mg/dL).   Medical History: Past Medical History:  Diagnosis Date  . Alcohol use disorder   . Cerebral vascular accident (HCC)   . 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 Diabetes mellitus type 2 in obese (HCC)   . HLD (hyperlipidemia)   . Morbid obesity (HCC)   . Seizures (HCC)   . Stroke (HCC)   . Tobacco abuse     Assessment: 56 year old male with history of DVT who was prescribed Xarelto but was not taking due to financial reasons (last dose 1 month ago). Found to have bilateral lower segment pulmonary embolus with L-sided pulmonary infarct. Pharmacy consulted to dose heparin.  Initial heparin level is therapeutic this AM. Repeat level came back within goal range at 0.32, on 1800 units/hr. Initial level slightly higher likely due to bolus. CBC remains stable. No signs/symptoms of bleeding. No infusion issues per nursing.   Goal of Therapy:  Heparin level 0.3-0.7 units/ml Monitor platelets by anticoagulation protocol: Yes   Plan:  Increase heparin infusion slightly to 1850 units/hr Monitor daily heparin levels, CBC, and for signs of bleeding Follow up with outpatient oral  anticoagulation plan  Girard CooterKimberly Perkins, PharmD Clinical Pharmacist  Pager: [phone] Clinical Phone for 07/12/2017 until 3:30pm: [phone] If after 3:30pm, please call main pharmacy at [phone]

## 2017-07-12 NOTE — Progress Notes (Signed)
PROGRESS NOTE    Ronald DuckKevin Van [facility] CampusDunk  Forbes:[id number] DOB: [birth year] DOA: 07/11/2017 PCP: Loletta SpecterGomez, Roger David, PA-C     Brief Narrative:  56 y.o. 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  Patient has been having left-sided pleuritic type of chest pain for last 2 weeks.   Assessment & Plan:   Principal Problem:   Acute pulmonary embolism (HCC) - Pt currently on heparin gtt will continue - troponins negative - Echocardiogram reporting normal EF of 55-60% with grade 1 DD  Active Problems:   Morbid obesity (HCC)   Diabetes mellitus type 2 in obese (HCC) - continue SSI, carb modified diet    Thyroid nodule - should continue with outpatient evaluation  PAD (peripheral artery disease) (HCC)   Unilateral AKA, right (HCC)   History of CVA with residual deficit - continue aspirin, lipitor    Seizures (HCC) - continue Keppra    Alcohol abuse   Pressure injury of skin - continue wound care  DVT prophylaxis: Heparin gtt Code Status: Full Family Communication: none at bedside Disposition Plan: pending improvement in condition. Will need to change to coumadin or NOAC   Consultants:   none   Procedures:    Antimicrobials: None   Subjective: Patient has no new complaints. No acute issues overnight  Objective: Vitals:   07/12/17 0755 07/12/17 1206 07/12/17 1606 07/12/17 1752  BP: 125/82 132/88 115/78 115/78  Pulse: 78 78 73 75  Resp: 20 18 18 18   Temp: 99.9 F (37.7 C) (!) 100.4 F (38 C) 99.7 F (37.6 C) 99.3 F (37.4 C)  TempSrc: Oral Axillary Oral Oral  SpO2: 93% 98% 95% 94%  Weight:      Height:        Intake/Output Summary (Last 24 hours) at 07/12/2017 1802 Last data filed at 07/12/2017 1450 Gross per 24 hour  Intake 338 ml  Output -  Net 338 ml   Filed Weights   07/11/17 2220 07/12/17 0255  Weight: 100.2 kg (220 lb 14.4 oz) 106.8 kg (235 lb 7.2 oz)    Examination:  General exam: Appears calm and comfortable, in nad. Respiratory system: Clear to auscultation. Respiratory effort normal. Equal chest rise Cardiovascular system: S1 & S2 heard, RRR. No JVD, murmurs, rubs, gallops or clicks. No pedal edema. Gastrointestinal system: Abdomen is nondistended, soft and nontender. No organomegaly or masses felt. Normal bowel sounds heard. Central nervous system: Patient has limited verbal responses to examiner. No facial asymmetry Extremities: warm, + distal pulses Skin: No rashes, lesions, on limited exam. ulcer Psychiatry:  Difficult to assess secondary to limited verbal response from history of CVA    Data Reviewed: I have personally reviewed following labs and imaging studies  CBC: Recent Labs  Lab 07/11/17 1841 07/12/17 0405  WBC 9.7 10.5  HGB 14.5 13.9  HCT 42.3 39.4  MCV 91.8 90.0  PLT 132* 142*   Basic Metabolic Panel: Recent Labs  Lab 07/11/17 1841 07/12/17 0405  NA 137 134*  K 3.8 3.8  CL 100* 101  CO2 27 22  GLUCOSE 116* 123*  BUN 9 9  CREATININE 0.83 0.79  CALCIUM 9.0 8.6*   GFR: Estimated Creatinine Clearance: 137.8 mL/min (by C-G formula based on SCr of 0.79 mg/dL). Liver Function Tests: No results for input(s): AST, ALT, ALKPHOS, BILITOT, PROT, ALBUMIN in the last 168 hours. No  results for input(s): LIPASE, AMYLASE in the last 168 hours. No results for input(s): AMMONIA in the last 168 hours. Coagulation Profile: No results for input(s): INR, PROTIME in the last 168 hours. Cardiac Enzymes: Recent Labs  Lab 07/12/17 0044 07/12/17 0405 07/12/17 1052  TROPONINI <0.03 <0.03 <0.03   BNP (last 3 results) No results for input(s): PROBNP in the last 8760 hours. HbA1C: Recent Labs    07/12/17 0044  HGBA1C 5.5   CBG: Recent Labs  Lab 07/12/17 0753 07/12/17 1129 07/12/17 1603  GLUCAP 128* 126* 114*   Lipid  Profile: No results for input(s): CHOL, HDL, LDLCALC, TRIG, CHOLHDL, LDLDIRECT in the last 72 hours. Thyroid Function Tests: Recent Labs    07/12/17 0044 07/12/17 0405  TSH <0.010*  --   FREET4  --  1.32*   Anemia Panel: No results for input(s): VITAMINB12, FOLATE, FERRITIN, TIBC, IRON, RETICCTPCT in the last 72 hours. Sepsis Labs: No results for input(s): PROCALCITON, LATICACIDVEN in the last 168 hours.  Recent Results (from the past 240 hour(s))  MRSA PCR Screening     Status: None   Collection Time: 07/12/17  3:03 AM  Result Value Ref Range Status   MRSA by PCR NEGATIVE NEGATIVE Final    Comment:        The GeneXpert MRSA Assay (FDA approved for NASAL specimens only), is one component of a comprehensive MRSA colonization surveillance program. It is not intended to diagnose MRSA infection nor to guide or monitor treatment for MRSA infections.      Radiology Studies: Dg Chest 2 View  Result Date: 07/11/2017 CLINICAL DATA:  Initial evaluation for acute central chest pain. EXAM: CHEST  2 VIEW COMPARISON:  Prior radiograph from 01/24/2017. FINDINGS: Mild cardiomegaly, stable. Mediastinal silhouette within normal limits. Lungs are hypoinflated. Mild patchy and linear left basilar opacity, favored to reflect atelectasis. No other consolidative opacity. No pulmonary edema for pleural effusion. No pneumothorax. No acute osseus abnormality. No acute osseus abnormality. IMPRESSION: 1. Shallow lung inflation with mild left basilar atelectasis. 2. No other active cardiopulmonary disease. Electronically Signed   By: Rise MuBenjamin  McClintock M.D.   On: 07/11/2017 19:24   Ct Angio Chest Pe W And/or Wo Contrast  Result Date: 07/11/2017 CLINICAL DATA:  Chest pressure starting 2 weeks ago, present intermittently. Symptoms became persistent 2 days ago. EXAM: CT ANGIOGRAPHY CHEST WITH CONTRAST TECHNIQUE: Multidetector CT imaging of the chest was performed using the standard protocol during bolus  administration of intravenous contrast. Multiplanar CT image reconstructions and MIPs were obtained to evaluate the vascular anatomy. CONTRAST:  100mL ISOVUE-370 IOPAMIDOL (ISOVUE-370) INJECTION 76% COMPARISON:  None. FINDINGS: Cardiovascular: Filling defects within the bilateral lower lobe pulmonary arteries, branching into bilateral lower lobe segmental vessels with occlusive clot seen bilaterally. The has both central and eccentric appearance compatible with acute to subacute historical timing. RV to LV ratio is upper normal at 0.9. This ratio is likely accentuated by hpertrophic appearance of the left ventricle. No acute finding in the faintly opacified aorta. No pericardial effusion. Mediastinum/Nodes: 2.5 cm right thyroid nodule. Although difficult to visualize today there is a definite nodule based on neck CTA 02/04/2016, with similar dimensions to prior. Lungs/Pleura: Asymmetric ground-glass density and reticulation in the left lower lobe which is considered ischemic. Lung volumes are low and there is bilateral subsegmental atelectasis. Trace pleural fluid along the left lower lobe. Upper Abdomen: No acute finding. Musculoskeletal: No acute or aggressive finding. Critical Value/emergent results were called by telephone at the time of interpretation  on 07/11/2017 at 10:28 pm to Dr. Shirlyn Goltz , who verbally acknowledged these results. Review of the MIP images confirms the above findings. IMPRESSION: 1. Positive for acute to subacute pulmonary embolism in the bilateral lower lobar and segmental arteries. Infarct in the left lower lobe. Negative for right heart strain. 2. Hypertrophic appearance of the left ventricle. 3. 2.5 cm right thyroid nodule, also noted on neck CTA 02/04/2016. Sonography is recommended if not already performed. Electronically Signed   By: Marnee Spring M.D.   On: 07/11/2017 22:32    Scheduled Meds: . aspirin EC  81 mg Oral Daily  . atorvastatin  40 mg Oral q1800  . insulin  aspart  0-9 Units Subcutaneous TID WC  . levETIRAcetam  500 mg Oral BID   Continuous Infusions: . heparin 1,850 Units/hr (07/12/17 1330)     LOS: 0 days    Time spent: > 35 minutes  Penny Pia, MD [facility] Pager [phone]  If 7PM-7AM, please contact night-coverage [email or web address] Password [facility] 07/12/2017, 6:02 PM

## 2017-07-12 NOTE — Progress Notes (Signed)
  Echocardiogram 2D Echocardiogram has been performed.  Ronald SavoyCasey Forbes Ronald Forbes 07/12/2017, 2:30 PM

## 2017-07-13 ENCOUNTER — Inpatient Hospital Stay (HOSPITAL_COMMUNITY): Payer: Medicaid Other

## 2017-07-13 DIAGNOSIS — E041 Nontoxic single thyroid nodule: Secondary | ICD-10-CM

## 2017-07-13 DIAGNOSIS — E669 Obesity, unspecified: Secondary | ICD-10-CM

## 2017-07-13 DIAGNOSIS — R109 Unspecified abdominal pain: Secondary | ICD-10-CM

## 2017-07-13 DIAGNOSIS — E1169 Type 2 diabetes mellitus with other specified complication: Secondary | ICD-10-CM

## 2017-07-13 DIAGNOSIS — R509 Fever, unspecified: Secondary | ICD-10-CM

## 2017-07-13 DIAGNOSIS — I2699 Other pulmonary embolism without acute cor pulmonale: Principal | ICD-10-CM

## 2017-07-13 LAB — CBC
HEMATOCRIT: 39.5 % (ref 39.0–52.0)
Hemoglobin: 13.5 g/dL (ref 13.0–17.0)
MCH: 30.5 pg (ref 26.0–34.0)
MCHC: 34.2 g/dL (ref 30.0–36.0)
MCV: 89.4 fL (ref 78.0–100.0)
PLATELETS: 151 10*3/uL (ref 150–400)
RBC: 4.42 MIL/uL (ref 4.22–5.81)
RDW: 12.8 % (ref 11.5–15.5)
WBC: 9.6 10*3/uL (ref 4.0–10.5)

## 2017-07-13 LAB — URINALYSIS, ROUTINE W REFLEX MICROSCOPIC
Bacteria, UA: NONE SEEN
Bilirubin Urine: NEGATIVE
Glucose, UA: NEGATIVE mg/dL
Ketones, ur: NEGATIVE mg/dL
Leukocytes, UA: NEGATIVE
Nitrite: NEGATIVE
Protein, ur: NEGATIVE mg/dL
Specific Gravity, Urine: 1.024 (ref 1.005–1.030)
pH: 5 (ref 5.0–8.0)

## 2017-07-13 LAB — GLUCOSE, CAPILLARY
GLUCOSE-CAPILLARY: 111 mg/dL — AB (ref 65–99)
GLUCOSE-CAPILLARY: 136 mg/dL — AB (ref 65–99)
GLUCOSE-CAPILLARY: 129 mg/dL — AB (ref 65–99)
Glucose-Capillary: 113 mg/dL — ABNORMAL HIGH (ref 65–99)

## 2017-07-13 LAB — T3, FREE: T3, Free: 4.2 pg/mL (ref 2.0–4.4)

## 2017-07-13 LAB — HEPARIN LEVEL (UNFRACTIONATED): Heparin Unfractionated: 0.36 IU/mL (ref 0.30–0.70)

## 2017-07-13 MED ORDER — SODIUM CHLORIDE 0.9 % IV BOLUS (SEPSIS)
500.0000 mL | Freq: Once | INTRAVENOUS | Status: AC
  Administered 2017-07-13: 500 mL via INTRAVENOUS

## 2017-07-13 MED ORDER — SENNA 8.6 MG PO TABS
1.0000 | ORAL_TABLET | Freq: Every day | ORAL | Status: DC
  Administered 2017-07-13 – 2017-07-14 (×2): 8.6 mg via ORAL
  Filled 2017-07-13 (×2): qty 1

## 2017-07-13 MED ORDER — RIVAROXABAN 15 MG PO TABS
15.0000 mg | ORAL_TABLET | Freq: Two times a day (BID) | ORAL | Status: DC
Start: 2017-07-13 — End: 2017-07-15
  Administered 2017-07-13 – 2017-07-15 (×5): 15 mg via ORAL
  Filled 2017-07-13 (×5): qty 1

## 2017-07-13 MED ORDER — RIVAROXABAN 20 MG PO TABS: 20.0000 mg | ORAL_TABLET | Freq: Every day | ORAL | Status: DC

## 2017-07-13 MED ORDER — POLYETHYLENE GLYCOL 3350 17 G PO PACK
17.0000 g | PACK | Freq: Two times a day (BID) | ORAL | Status: DC
  Administered 2017-07-13 – 2017-07-14 (×2): 17 g via ORAL
  Filled 2017-07-13 (×3): qty 1

## 2017-07-13 NOTE — Progress Notes (Addendum)
[facility] PROGRESS NOTE  Ronald Forbes [facility] WUJ:[id number] DOB: [birth year] DOA: 07/11/2017  PCP: Loletta Specter, PA-C  Brief History/Interval Summary: 56 year old male with past medical history of stroke with right-sided weakness, history of type 2 diabetes, seizures, history of DVT, history of peripheral vascular disease status post right above-knee amputation presented with chest pain.  Evaluation revealed pulmonary embolism.  He was hospitalized for further management.  Reason for Visit: Acute pulmonary embolism  Consultants: None  Procedures:  Transthoracic echocardiogram Study Conclusions  - Left ventricle: The cavity size was normal. Wall thickness was   increased in a pattern of moderate LVH. Systolic function was   normal. The estimated ejection fraction was in the range of 55%   to 60%. Wall motion was normal; there were no regional wall   motion abnormalities. Doppler parameters are consistent with   abnormal left ventricular relaxation (grade 1 diastolic   dysfunction). The E/e&' ratio is between 8-15, suggesting   indeterminate LV filling pressure. - Aortic valve: Sclerosis without stenosis. There was no   regurgitation. - Left atrium: The atrium was normal in size. - Tricuspid valve: There was mild regurgitation. - Pulmonary arteries: PA peak pressure: 37 mm Hg (S). - Inferior vena cava: The vessel was normal in size. The   respirophasic diameter changes were in the normal range (>= 50%),   consistent with normal central venous pressure.  Impressions:  - LVEF 55-60%, moderate LVH, normal wall motion, grade 1 DD,   indeterminate LV filling pressure, aortic valve sclerosis, normal   LA size, mild TR, RVSP 37 mmHg, normal IVC, no signs of RV   strain.  Antibiotics: None  Subjective/Interval History: Patient complains of on and off pain in the left side of his abdomen ongoing for 2-3 weeks.  Denies any nausea.  Chest pain has resolved.  ROS:  Denies any urinary complaints.  Last bowel movement was 2 days ago.  Objective:  Vital Signs  Vitals:   07/13/17 0500 07/13/17 0700 07/13/17 1100 07/13/17 1207  BP: 114/71 108/73 126/73   Pulse: 83 77 82   Resp: 20 15 (!) 24   Temp: 99 F (37.2 C) 99.8 F (37.7 C) (!) 100.4 F (38 C) 99.2 F (37.3 C)  TempSrc: Oral Oral Oral Axillary  SpO2: 95% 97% 95%   Weight: 106.6 kg (235 lb 0.2 oz)     Height:        Intake/Output Summary (Last 24 hours) at 07/13/2017 1342 Last data filed at 07/13/2017 9562 Gross per 24 hour  Intake 768.65 ml  Output 825 ml  Net -56.35 ml   Filed Weights   07/11/17 2220 07/12/17 0255 07/13/17 0500  Weight: 100.2 kg (220 lb 14.4 oz) 106.8 kg (235 lb 7.2 oz) 106.6 kg (235 lb 0.2 oz)    General appearance: alert, cooperative, appears stated age and no distress Head: Normocephalic, without obvious abnormality, atraumatic Resp: clear to auscultation bilaterally Cardio: regular rate and rhythm, S1, S2 normal, no murmur, click, rub or gallop GI: Abdomen is soft.  Nontender nondistended.  Bowel sounds are present.  No masses organomegaly Extremities: Right AKA Pulses: 2+ and symmetric Skin: Skin color, texture, turgor normal. No rashes or lesions Lymph nodes: Cervical, supraclavicular, and axillary nodes normal. Neurologic: Right hemiparesis from previous stroke  Lab Results:  Data Reviewed: I have personally reviewed following labs and imaging studies  CBC: Recent Labs  Lab 07/11/17 1841 07/12/17 0405 07/13/17 0518  WBC 9.7 10.5 9.6  HGB 14.5 13.9  13.5  HCT 42.3 39.4 39.5  MCV 91.8 90.0 89.4  PLT 132* 142* 151    Basic Metabolic Panel: Recent Labs  Lab 07/11/17 1841 07/12/17 0405  NA 137 134*  K 3.8 3.8  CL 100* 101  CO2 27 22  GLUCOSE 116* 123*  BUN 9 9  CREATININE 0.83 0.79  CALCIUM 9.0 8.6*    GFR: Estimated Creatinine Clearance: 137.7 mL/min (by C-G formula based on SCr of 0.79 mg/dL).  Cardiac Enzymes: Recent Labs    Lab 07/12/17 0044 07/12/17 0405 07/12/17 1052  TROPONINI <0.03 <0.03 <0.03    HbA1C: Recent Labs    07/12/17 0044  HGBA1C 5.5    CBG: Recent Labs  Lab 07/12/17 1129 07/12/17 1603 07/12/17 2027 07/13/17 0801 07/13/17 1203  GLUCAP 126* 114* 162* 113* 111*    Thyroid Function Tests: Recent Labs    07/12/17 0044 07/12/17 0405  TSH <0.010*  --   FREET4  --  1.32*  T3FREE  --  4.2      Recent Results (from the past 240 hour(s))  MRSA PCR Screening     Status: None   Collection Time: 07/12/17  3:03 AM  Result Value Ref Range Status   MRSA by PCR NEGATIVE NEGATIVE Final    Comment:        The GeneXpert MRSA Assay (FDA approved for NASAL specimens only), is one component of a comprehensive MRSA colonization surveillance program. It is not intended to diagnose MRSA infection nor to guide or monitor treatment for MRSA infections.       Radiology Studies: Dg Chest 2 View  Result Date: 07/11/2017 CLINICAL DATA:  Initial evaluation for acute central chest pain. EXAM: CHEST  2 VIEW COMPARISON:  Prior radiograph from 01/24/2017. FINDINGS: Mild cardiomegaly, stable. Mediastinal silhouette within normal limits. Lungs are hypoinflated. Mild patchy and linear left basilar opacity, favored to reflect atelectasis. No other consolidative opacity. No pulmonary edema for pleural effusion. No pneumothorax. No acute osseus abnormality. No acute osseus abnormality. IMPRESSION: 1. Shallow lung inflation with mild left basilar atelectasis. 2. No other active cardiopulmonary disease. Electronically Signed   By: Rise MuBenjamin  McClintock M.D.   On: 07/11/2017 19:24   Ct Angio Chest Pe W And/or Wo Contrast  Result Date: 07/11/2017 CLINICAL DATA:  Chest pressure starting 2 weeks ago, present intermittently. Symptoms became persistent 2 days ago. EXAM: CT ANGIOGRAPHY CHEST WITH CONTRAST TECHNIQUE: Multidetector CT imaging of the chest was performed using the standard protocol during bolus  administration of intravenous contrast. Multiplanar CT image reconstructions and MIPs were obtained to evaluate the vascular anatomy. CONTRAST:  100mL ISOVUE-370 IOPAMIDOL (ISOVUE-370) INJECTION 76% COMPARISON:  None. FINDINGS: Cardiovascular: Filling defects within the bilateral lower lobe pulmonary arteries, branching into bilateral lower lobe segmental vessels with occlusive clot seen bilaterally. The has both central and eccentric appearance compatible with acute to subacute historical timing. RV to LV ratio is upper normal at 0.9. This ratio is likely accentuated by hpertrophic appearance of the left ventricle. No acute finding in the faintly opacified aorta. No pericardial effusion. Mediastinum/Nodes: 2.5 cm right thyroid nodule. Although difficult to visualize today there is a definite nodule based on neck CTA 02/04/2016, with similar dimensions to prior. Lungs/Pleura: Asymmetric ground-glass density and reticulation in the left lower lobe which is considered ischemic. Lung volumes are low and there is bilateral subsegmental atelectasis. Trace pleural fluid along the left lower lobe. Upper Abdomen: No acute finding. Musculoskeletal: No acute or aggressive finding. Critical Value/emergent results were called by  telephone at the time of interpretation on 07/11/2017 at 10:28 pm to Dr. Shirlyn Goltz , who verbally acknowledged these results. Review of the MIP images confirms the above findings. IMPRESSION: 1. Positive for acute to subacute pulmonary embolism in the bilateral lower lobar and segmental arteries. Infarct in the left lower lobe. Negative for right heart strain. 2. Hypertrophic appearance of the left ventricle. 3. 2.5 cm right thyroid nodule, also noted on neck CTA 02/04/2016. Sonography is recommended if not already performed. Electronically Signed   By: Marnee Spring M.D.   On: 07/11/2017 22:32   Dg Chest Port 1 View  Result Date: 07/13/2017 CLINICAL DATA:  Fever EXAM: PORTABLE CHEST 1 VIEW  COMPARISON:  None. FINDINGS: Lungs are very under aerated. Bibasilar atelectasis is worse. Left pleural effusion is suspected. Heart is normal in size allowing for low volumes. No pneumothorax. IMPRESSION: Worsening bibasilar atelectasis. Electronically Signed   By: Jolaine Click M.D.   On: 07/13/2017 09:01   Dg Abd 2 Views  Result Date: 07/13/2017 CLINICAL DATA:  57 year old male with left side abdominal pain. No recent bowel movement. EXAM: ABDOMEN - 2 VIEW COMPARISON:  Chest CTA 07/11/2017. FINDINGS: Upright and supine views of the abdomen and pelvis. Continued confluent left lung base opacity. Increased patchy opacity at the right lung base. Left chest cardiac loop recorder re- demonstrated. No pneumoperitoneum. Non obstructed bowel gas pattern. Gas and stool throughout the left and distal colon. Abdominal and pelvic visceral contours appear normal. Disc and endplate degeneration in the lumbar spine. No acute osseous abnormality identified. IMPRESSION: 1. Normal bowel gas pattern, no free air. Retained stool in the descending and sigmoid colon. 2. Continued left greater than right lung base opacity. Electronically Signed   By: Odessa Fleming M.D.   On: 07/13/2017 13:26     Medications:  Scheduled: . aspirin EC  81 mg Oral Daily  . atorvastatin  40 mg Oral q1800  . insulin aspart  0-9 Units Subcutaneous TID WC  . levETIRAcetam  500 mg Oral BID  . polyethylene glycol  17 g Oral BID  . rivaroxaban  15 mg Oral BID   Followed by  . [START ON 08/03/2017] rivaroxaban  20 mg Oral Q supper  . senna  1 tablet Oral Daily   Continuous:  ZOX:[id number] **OR** acetaminophen, morphine injection, ondansetron **OR** ondansetron (ZOFRAN) IV, oxyCODONE  Assessment/Plan:  Principal Problem:   Acute pulmonary embolism (HCC) Active Problems:   Morbid obesity (HCC)   Diabetes mellitus type 2 in obese [facility])   Thyroid nodule   PAD (peripheral artery disease) (HCC)   Unilateral AKA, right (HCC)   History of  CVA with residual deficit   Seizures (HCC)   Alcohol abuse   Pulmonary embolism (HCC)     Acute pulmonary embolism Echocardiogram shows normal EF without any right heart strain.  Patient is currently on heparin infusion.  We will transition to Xarelto.  Patient has not taken his Xarelto for the last 1 month due to financial issues.  Fever Patient spiked a temperature of 102 F last night.  No obvious source of infection is found.  Chest x-ray was done overnight which did not show any pneumonia.  UA does not show any UTI.  Fever could be from PE.  Continue to monitor for now without any antibiotics.  Abdominal pain This is very nonspecific.  Abdomen was benign on examination.  Abdominal x-ray does show constipation.  He will be given laxatives.  Right Thyroid nodule Incidentally noted on  CT scan.  TSH noted to be low at less than 0.01.  Free T4 is 1.32.  Free T3 is 4.2.  We will proceed with ultrasound.  Since TSH is low patient will need to undergo radionuclide thyroid scan.  This can be pursued as an outpatient.  Patient not exhibiting any signs or symptoms of hyperthyroidism.  Peripheral vascular disease He is status post right AKA.  Stable.  History of CVA with right hemiparesis Stable.  Continue aspirin.  Continue statin.  History of seizure disorder Continue Keppra.  Diabetes mellitus type 2 in obese Continue SSI.    DVT Prophylaxis: Xarelto    Code Status: Full code Family Communication: Discussed with the patient Disposition Plan: Management as outlined above.    LOS: 1 day   Osvaldo ShipperGokul Maybelle Depaoli  [facility] Pager [phone] 07/13/2017, 1:42 PM  If 7PM-7AM, please contact night-coverage at [email or web address], password [email or web address]

## 2017-07-13 NOTE — Discharge Instructions (Signed)
Information on my medicine - XARELTO® (rivaroxaban) ° °This medication education was reviewed with me or my healthcare representative as part of my discharge preparation.  The pharmacist that spoke with me during my hospital stay was:  Chun Sellen A Perkins, RPH ° °WHY WAS XARELTO® PRESCRIBED FOR YOU? °Xarelto® was prescribed to treat blood clots that may have been found in the veins of your legs (deep vein thrombosis) or in your lungs (pulmonary embolism) and to reduce the risk of them occurring again. ° °What do you need to know about Xarelto®? °The starting dose is one 15 mg tablet taken TWICE daily with food for the FIRST 21 DAYS then the dose is changed to one 20 mg tablet taken ONCE A DAY with your evening meal. ° °DO NOT stop taking Xarelto® without talking to the health care provider who prescribed the medication.  Refill your prescription for 20 mg tablets before you run out. ° °After discharge, you should have regular check-up appointments with your healthcare provider that is prescribing your Xarelto®.  In the future your dose may need to be changed if your kidney function changes by a significant amount. ° °What do you do if you miss a dose? °If you are taking Xarelto® TWICE DAILY and you miss a dose, take it as soon as you remember. You may take two 15 mg tablets (total 30 mg) at the same time then resume your regularly scheduled 15 mg twice daily the next day. ° °If you are taking Xarelto® ONCE DAILY and you miss a dose, take it as soon as you remember on the same day then continue your regularly scheduled once daily regimen the next day. Do not take two doses of Xarelto® at the same time.  ° °Important Safety Information °Xarelto® is a blood thinner medicine that can cause bleeding. You should call your healthcare provider right away if you experience any of the following: °? Bleeding from an injury or your nose that does not stop. °? Unusual colored urine (red or dark brown) or unusual colored stools  (red or black). °? Unusual bruising for unknown reasons. °? A serious fall or if you hit your head (even if there is no bleeding). ° °Some medicines may interact with Xarelto® and might increase your risk of bleeding while on Xarelto®. To help avoid this, consult your healthcare provider or pharmacist prior to using any new prescription or non-prescription medications, including herbals, vitamins, non-steroidal anti-inflammatory drugs (NSAIDs) and supplements. ° °This website has more information on Xarelto®: www.xarelto.com. °

## 2017-07-13 NOTE — Progress Notes (Addendum)
ANTICOAGULATION CONSULT NOTE   Pharmacy Consult for Heparin Indication: pulmonary embolus  No Known Allergies  Patient Measurements: Height: 6\' 3"  (190.5 cm) Weight: 235 lb 0.2 oz (106.6 kg) IBW/kg (Calculated) : 84.5  IBW = 84.5 kg Heparin Dosing Weight: 100.2 kg  Vital Signs: Temp: 99 F (37.2 C) (11/28 0500) Temp Source: Oral (11/28 0500) BP: 114/71 (11/28 0500) Pulse Rate: 83 (11/28 0500)  Labs: Recent Labs    07/11/17 1841 07/12/17 0044 07/12/17 0405 07/12/17 1052 07/13/17 0518  HGB 14.5  --  13.9  --  13.5  HCT 42.3  --  39.4  --  39.5  PLT 132*  --  142*  --  151  HEPARINUNFRC  --   --  0.53 0.32 0.36  CREATININE 0.83  --  0.79  --   --   TROPONINI  --  <0.03 <0.03 <0.03  --     Estimated Creatinine Clearance: 137.7 mL/min (by C-G formula based on SCr of 0.79 mg/dL).   Medical History: Past Medical History:  Diagnosis Date  . Alcohol use disorder   . Cerebral vascular accident (HCC)   . 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 Diabetes mellitus type 2 in obese (HCC)   . HLD (hyperlipidemia)   . Morbid obesity (HCC)   . Seizures (HCC)   . Stroke (HCC)   . Tobacco abuse     Assessment: 56 year old male with history of DVT who was prescribed Xarelto but was not taking due to financial reasons (last dose 1 month ago). Found to have bilateral lower segment pulmonary embolus with L-sided pulmonary infarct. Pharmacy consulted to dose heparin.  Heparin level is therapeutic at 0.36 on 1850 units/hr this AM. CBC remains stable. No signs/symptoms of bleeding. No infusion issues.   Goal of Therapy:  Heparin level 0.3-0.7 units/ml Monitor platelets by anticoagulation protocol: Yes   Plan:  Continue heparin infusion at 1850 units/hr Monitor daily heparin levels, CBC, and for signs of bleeding Follow up with outpatient oral anticoagulation plan  Girard CooterKimberly Perkins, PharmD Clinical Pharmacist  Pager:  [phone] Clinical Phone for 07/13/2017 until 3:30pm: [phone] If after 3:30pm, please call main pharmacy at [phone]  [facility] consulted to dose rivaroxaban. Patient was on previously; however, was non-compliant due to financial concerns. Will discontinue heparin infusion and start Xarelto 15 mg twice daily for 21 days followed by 20 mg daily thereafter. Will monitor CBC and for signs/symptoms of bleeding.  Girard CooterKimberly Perkins, PharmD Clinical Pharmacist

## 2017-07-13 NOTE — Clinical Social Work Note (Signed)
Clinical Social Work Assessment  Patient Details  Name: Ronald Forbes MRN: [id number] Date of Birth: 1961/01/26  Date of referral:  07/13/17               Reason for consult:  Care Management Concerns                Permission sought to share information with:    Permission granted to share information::     Name::     Barbaraann Cao [facility]  Agency::     Relationship::  sister  Contact Information:  [phone]  Housing/Transportation Living arrangements for the past 2 months:  Apartment Source of Information:  Patient Patient Interpreter Needed:  None Criminal Activity/Legal Involvement Pertinent to Current Situation/Hospitalization:  No - Comment as needed Significant Relationships:  Siblings Lives with:  Siblings Do you feel safe going back to the place where you live?  Yes Need for family participation in patient care:  Yes (Comment)  Care giving concerns: Patient from home with sister. Concern for medication compliance.   Social Worker assessment / plan: CSW consulted by [facility] regarding concern for patient social issues impacting medication compliance, and possible exploitation.  RNCM provided education on importance of Xarelto compliance and direct impact on patient's condition with patient and with sister; sister reported agreement to support patient in taking prescribed medications going forward. CSW met with patient at bedside and also discussed. Patient reported he lives with sister and she takes care of him. Patient reported he pays for his medication out of his social security check each month and indicated he can afford them. CSW assessed for living situation concerns; patient indicated no concerns with living with his sister or the care she provides. CSW reinforced RNCM teaching of importance of medication compliance. CSW called sister at multiple phone numbers provided and was unable to reach sister. CSW staffed case with supervisor and determined no additional concerns. CSW  signing off.  Employment status:  Disabled (Comment on whether or not currently receiving Disability) Insurance information:  Medicaid In [place] PT Recommendations:  Not assessed at this time Information / Referral to community resources:  Other (Comment Required)  Patient/Family's Response to care: Not discussed.  Patient/Family's Understanding of and Emotional Response to Diagnosis, Current Treatment, and Prognosis: Not discussed.  Emotional Assessment Appearance:  Appears stated age Attitude/Demeanor/Rapport:  Other(appropriate) Affect (typically observed):  Calm, Blunt Orientation:  Oriented to Self, Oriented to Place, Oriented to  Time, Oriented to Situation Alcohol / Substance use:  Not Applicable Psych involvement (Current and /or in the community):  No (Comment)  Discharge Needs  Concerns to be addressed:  Medication Concerns Readmission within the last 30 days:  No Current discharge risk:  Physical Impairment Barriers to Discharge:  Continued Medical Work up   Estanislado Emms, LCSW 07/13/2017, 10:56 AM

## 2017-07-14 DIAGNOSIS — K5900 Constipation, unspecified: Secondary | ICD-10-CM

## 2017-07-14 LAB — CBC
HEMATOCRIT: 37.7 % — AB (ref 39.0–52.0)
Hemoglobin: 13 g/dL (ref 13.0–17.0)
MCH: 31 pg (ref 26.0–34.0)
MCHC: 34.5 g/dL (ref 30.0–36.0)
MCV: 89.8 fL (ref 78.0–100.0)
Platelets: 142 10*3/uL — ABNORMAL LOW (ref 150–400)
RBC: 4.2 MIL/uL — AB (ref 4.22–5.81)
RDW: 12.7 % (ref 11.5–15.5)
WBC: 9 10*3/uL (ref 4.0–10.5)

## 2017-07-14 LAB — GLUCOSE, CAPILLARY
GLUCOSE-CAPILLARY: 141 mg/dL — AB (ref 65–99)
GLUCOSE-CAPILLARY: 124 mg/dL — AB (ref 65–99)
Glucose-Capillary: 121 mg/dL — ABNORMAL HIGH (ref 65–99)
Glucose-Capillary: 135 mg/dL — ABNORMAL HIGH (ref 65–99)

## 2017-07-14 LAB — BASIC METABOLIC PANEL
ANION GAP: 8 (ref 5–15)
BUN: 10 mg/dL (ref 6–20)
CHLORIDE: 101 mmol/L (ref 101–111)
CO2: 23 mmol/L (ref 22–32)
Calcium: 8.4 mg/dL — ABNORMAL LOW (ref 8.9–10.3)
Creatinine, Ser: 0.84 mg/dL (ref 0.61–1.24)
GFR calc Af Amer: >60 mL/min (ref >60)
GFR calc non Af Amer: >60 mL/min (ref >60)
GLUCOSE: 118 mg/dL — AB (ref 65–99)
POTASSIUM: 3.9 mmol/L (ref 3.5–5.1)
Sodium: 132 mmol/L — ABNORMAL LOW (ref 135–145)

## 2017-07-14 MED ORDER — DOXYCYCLINE HYCLATE 100 MG PO TABS
100.0000 mg | ORAL_TABLET | Freq: Two times a day (BID) | ORAL | Status: DC
  Administered 2017-07-14 – 2017-07-15 (×3): 100 mg via ORAL
  Filled 2017-07-14 (×3): qty 1

## 2017-07-14 NOTE — Progress Notes (Addendum)
[facility] PROGRESS NOTE  [facility] ZOX:[id number] DOB: [birth year] DOA: 07/11/2017  PCP: Ronald Forbes, Roger David, PA-C  Brief History/Interval Summary: 56 year old male with past medical history of stroke with right-sided weakness, history of type 2 diabetes, seizures, history of DVT, history of peripheral vascular disease status post right above-knee amputation presented with chest pain.  Evaluation revealed pulmonary embolism.  He was hospitalized for further management.  Reason for Visit: Acute pulmonary embolism  Consultants: None  Procedures:  Transthoracic echocardiogram Study Conclusions  - Left ventricle: The cavity size was normal. Wall thickness was   increased in a pattern of moderate LVH. Systolic function was   normal. The estimated ejection fraction was in the range of 55%   to 60%. Wall motion was normal; there were no regional wall   motion abnormalities. Doppler parameters are consistent with   abnormal left ventricular relaxation (grade 1 diastolic   dysfunction). The E/e&' ratio is between 8-15, suggesting   indeterminate LV filling pressure. - Aortic valve: Sclerosis without stenosis. There was no   regurgitation. - Left atrium: The atrium was normal in size. - Tricuspid valve: There was mild regurgitation. - Pulmonary arteries: PA peak pressure: 37 mm Hg (S). - Inferior vena cava: The vessel was normal in size. The   respirophasic diameter changes were in the normal range (>= 50%),   consistent with normal central venous pressure.  Impressions:  - LVEF 55-60%, moderate LVH, normal wall motion, grade 1 DD,   indeterminate LV filling pressure, aortic valve sclerosis, normal   LA size, mild TR, RVSP 37 mmHg, normal IVC, no signs of RV   strain.  Antibiotics: None  Subjective/Interval History: Patient states that he is feeling fine.  He really wants to go home.  Continues to have a cough but unable to expectorate.  Denies any chest pain.   Denies any abdominal pain currently.  No problems urinating.    ROS: Has not had a bowel movement yet  Objective:  Vital Signs  Vitals:   07/13/17 2038 07/14/17 0014 07/14/17 0526 07/14/17 0700  BP: 134/86 119/70 112/71 123/77  Pulse: 82 83 83 87  Resp: (!) 21 16 18  (!) 22  Temp: 98.7 F (37.1 C) 99.6 F (37.6 C) 98.7 F (37.1 C) 99.1 F (37.3 C)  TempSrc: Oral Oral Oral Oral  SpO2: 94% 95% 95% 97%  Weight:   105.5 kg (232 lb 9.4 oz)   Height:        Intake/Output Summary (Last 24 hours) at 07/14/2017 0951 Last data filed at 07/14/2017 0700 Gross per 24 hour  Intake 360 ml  Output 2400 ml  Net -2040 ml   Filed Weights   07/12/17 0255 07/13/17 0500 07/14/17 0526  Weight: 106.8 kg (235 lb 7.2 oz) 106.6 kg (235 lb 0.2 oz) 105.5 kg (232 lb 9.4 oz)    General appearance: Awake alert.  Mildly distracted at times. Resp: Few crackles at the bases.  No wheezing.  No rhonchi Cardio: S1-S2 is normal regular.  No S3-S4.  No rubs murmurs or bruit GI: Abdomen soft.  Nontender nondistended.  Bowel sounds are present.  No masses organomegaly  Extremities: Right AKA Neurologic: Right hemiparesis from previous stroke.  Lab Results:  Data Reviewed: I have personally reviewed following labs and imaging studies  CBC: Recent Labs  Lab 07/11/17 1841 07/12/17 0405 07/13/17 0518 07/14/17 0320  WBC 9.7 10.5 9.6 9.0  HGB 14.5 13.9 13.5 13.0  HCT 42.3 39.4 39.5 37.7*  MCV  91.8 90.0 89.4 89.8  PLT 132* 142* 151 142*    Basic Metabolic Panel: Recent Labs  Lab 07/11/17 1841 07/12/17 0405 07/14/17 0320  NA 137 134* 132*  K 3.8 3.8 3.9  CL 100* 101 101  CO2 27 22 23   GLUCOSE 116* 123* 118*  BUN 9 9 10   CREATININE 0.83 0.79 0.84  CALCIUM 9.0 8.6* 8.4*    GFR: Estimated Creatinine Clearance: 130.6 mL/min (by C-G formula based on SCr of 0.84 mg/dL).  Cardiac Enzymes: Recent Labs  Lab 07/12/17 0044 07/12/17 0405 07/12/17 1052  TROPONINI <0.03 <0.03 <0.03     HbA1C: Recent Labs    07/12/17 0044  HGBA1C 5.5    CBG: Recent Labs  Lab 07/13/17 0801 07/13/17 1203 07/13/17 1636 07/13/17 2036 07/14/17 0820  GLUCAP 113* 111* 136* 129* 121*    Thyroid Function Tests: Recent Labs    07/12/17 0044 07/12/17 0405  TSH <0.010*  --   FREET4  --  1.32*  T3FREE  --  4.2      Recent Results (from the past 240 hour(s))  MRSA PCR Screening     Status: None   Collection Time: 07/12/17  3:03 AM  Result Value Ref Range Status   MRSA by PCR NEGATIVE NEGATIVE Final    Comment:        The GeneXpert MRSA Assay (FDA approved for NASAL specimens only), is one component of a comprehensive MRSA colonization surveillance program. It is not intended to diagnose MRSA infection nor to guide or monitor treatment for MRSA infections.       Radiology Studies: Dg Chest Port 1 View  Result Date: 07/13/2017 CLINICAL DATA:  Fever EXAM: PORTABLE CHEST 1 VIEW COMPARISON:  None. FINDINGS: Lungs are very under aerated. Bibasilar atelectasis is worse. Left pleural effusion is suspected. Heart is normal in size allowing for low volumes. No pneumothorax. IMPRESSION: Worsening bibasilar atelectasis. Electronically Signed   By: Jolaine Click M.D.   On: 07/13/2017 09:01   Dg Abd 2 Views  Result Date: 07/13/2017 CLINICAL DATA:  56 year old male with left side abdominal pain. No recent bowel movement. EXAM: ABDOMEN - 2 VIEW COMPARISON:  Chest CTA 07/11/2017. FINDINGS: Upright and supine views of the abdomen and pelvis. Continued confluent left lung base opacity. Increased patchy opacity at the right lung base. Left chest cardiac loop recorder re- demonstrated. No pneumoperitoneum. Non obstructed bowel gas pattern. Gas and stool throughout the left and distal colon. Abdominal and pelvic visceral contours appear normal. Disc and endplate degeneration in the lumbar spine. No acute osseous abnormality identified. IMPRESSION: 1. Normal bowel gas pattern, no free air.  Retained stool in the descending and sigmoid colon. 2. Continued left greater than right lung base opacity. Electronically Signed   By: Odessa Fleming M.D.   On: 07/13/2017 13:26   US Thyroid  Result Date: 07/13/2017 CLINICAL DATA:  Thyroid nodule by recent CT EXAM: THYROID ULTRASOUND TECHNIQUE: Ultrasound examination of the thyroid gland and adjacent soft tissues was performed. COMPARISON:  07/11/2017 FINDINGS: Parenchymal Echotexture: Mildly heterogenous Isthmus: 3 mm Right lobe: 6.2 x 3.3 x 3.3 cm Left lobe: 5.0 x 1.7 x 1.3 cm _________________________________________________________ Estimated total number of nodules >/= 1 cm: 1 Number of spongiform nodules >/=  2 cm not described below (TR1): 0 Number of mixed cystic and solid nodules >/= 1.5 cm not described below (TR2): 0 _________________________________________________________ Nodule # 1: Location: Right; Mid Maximum size: 3.9 cm; Other 2 dimensions: 3.0 x 3.0 cm Composition: solid/almost completely  solid (2) Echogenicity: isoechoic (1) Shape: not taller-than-wide (0) Margins: ill-defined (0) Echogenic foci: none (0) ACR TI-RADS total points: 3. ACR TI-RADS risk category: TR3 (3 points). ACR TI-RADS recommendations: **Given size (>/= 2.5 cm) and appearance, fine needle aspiration of this mildly suspicious nodule should be considered based on TI-RADS criteria. _________________________________________________________ Small parenchymal calcification noted in the left lobe inferiorly measuring less than 2 mm. IMPRESSION: 3.9 cm right mid thyroid TR 3 nodule correlates with the CT finding. This meets criteria for biopsy as above. The above is in keeping with the ACR TI-RADS recommendations - J Am Coll Radiol 2017;14:587-595. Electronically Signed   By: Judie PetitM.  Shick M.D.   On: 07/13/2017 20:24     Medications:  Scheduled: . aspirin EC  81 mg Oral Daily  . atorvastatin  40 mg Oral q1800  . doxycycline  100 mg Oral Q12H  . insulin aspart  0-9 Units Subcutaneous  TID WC  . levETIRAcetam  500 mg Oral BID  . polyethylene glycol  17 g Oral BID  . rivaroxaban  15 mg Oral BID   Followed by  . [START ON 08/03/2017] rivaroxaban  20 mg Oral Q supper  . senna  1 tablet Oral Daily   Continuous:  XLK:[id number]:acetaminophen **OR** acetaminophen, morphine injection, ondansetron **OR** ondansetron (ZOFRAN) IV, oxyCODONE  Assessment/Plan:  Principal Problem:   Acute pulmonary embolism (HCC) Active Problems:   Morbid obesity (HCC)   Diabetes mellitus type 2 in obese [facility](HCC)   Thyroid nodule   PAD (peripheral artery disease) (HCC)   Unilateral AKA, right (HCC)   History of CVA with residual deficit   Seizures (HCC)   Alcohol abuse   Pulmonary embolism (HCC)     Acute pulmonary embolism Echocardiogram shows normal EF without any right heart strain.  Patient was initially started on heparin infusion.  Now transitioned to Xarelto.  Patient has not taken his Xarelto for the last 1 month due to financial issues.  Fever The patient has had a fever of 102 F on last 2 days.  There is no obvious source of infection.  He does have a cough but chest x-ray did not show any infiltrates yesterday.  He is otherwise stable.  Fever could be due to pulmonary embolism.  Unlikely to be due to his thyroid function abnormalities.  Since he could still have a respiratory source we will put him on doxycycline for now.  Continue to monitor.  WBC remains normal.  Abdominal pain likely due to constipation This is very nonspecific.  Abdomen remains benign on examination.  Abdominal films did show constipation and the patient was started on laxatives.  Has not had a response yet.  Right Thyroid nodule Incidentally noted on CT scan.  TSH noted to be low at less than 0.01.  Free T4 is 1.32.  Free T3 is 4.2.  Ultrasound did show thyroid nodules.  Since his TSH is low patient will need to undergo radionuclide thyroid scan before pursuing biopsy.  This can be pursued as an outpatient.  He will  also need to be seen by an endocrinologist.    Peripheral vascular disease He is status post right AKA.  Stable.  History of CVA with right hemiparesis Stable.  Continue aspirin.  Continue statin.  History of seizure disorder Continue Keppra.  Stable.  No seizure activity.  Diabetes mellitus type 2 in obese Continue SSI.    DVT Prophylaxis: Xarelto    Code Status: Full code Family Communication: Discussed with the patient.  Tried contacting his sister on multiple numbers without any success. Disposition Plan: Wait for fever to subside.    LOS: 2 days   Osvaldo Shipper  [facility] Pager [phone] 07/14/2017, 9:51 AM  If 7PM-7AM, please contact night-coverage at [email or web address], password [facility]

## 2017-07-15 LAB — GLUCOSE, CAPILLARY
GLUCOSE-CAPILLARY: 118 mg/dL — AB (ref 65–99)
Glucose-Capillary: 129 mg/dL — ABNORMAL HIGH (ref 65–99)

## 2017-07-15 MED ORDER — RIVAROXABAN (XARELTO) VTE STARTER PACK (15 & 20 MG): ORAL_TABLET | ORAL | 0 refills | Status: DC

## 2017-07-15 MED ORDER — DOXYCYCLINE HYCLATE 100 MG PO TABS: 100.0000 mg | ORAL_TABLET | Freq: Two times a day (BID) | ORAL | 0 refills | Status: AC

## 2017-07-15 MED ORDER — POLYETHYLENE GLYCOL 3350 17 G PO PACK: 17.0000 g | PACK | Freq: Two times a day (BID) | ORAL | 0 refills | Status: DC

## 2017-07-15 MED ORDER — RIVAROXABAN 20 MG PO TABS: 20.0000 mg | ORAL_TABLET | Freq: Every day | ORAL | 2 refills | Status: DC

## 2017-07-15 NOTE — Discharge Summary (Signed)
[facility]  Physician Discharge Summary   Patient ID: Ronald CokeKevin Van Dunk MRN: [id number] DOB/AGE: 09-19-1960 56 y.o.  Admit date: 07/11/2017 Discharge date: 07/15/2017  PCP: Loletta SpecterGomez, Roger David, PA-C  DISCHARGE DIAGNOSES:  Principal Problem:   Acute pulmonary embolism [facility](HCC) Active Problems:   Morbid obesity (HCC)   Diabetes mellitus type 2 in obese [facility](HCC)   Thyroid nodule   PAD (peripheral artery disease) (HCC)   Unilateral AKA, right (HCC)   History of CVA with residual deficit   Seizures (HCC)   Alcohol abuse   Pulmonary embolism (HCC)   Pressure injury of skin   RECOMMENDATIONS FOR OUTPATIENT FOLLOW UP: 1. Patient instructed to follow-up with his primary care provider for further management of the thyroid nodule 2. Consider referral to endocrinology for same. 3. He has been asked to be compliant with his medications   DISCHARGE CONDITION: fair  Diet recommendation: Modified carbohydrate  Filed Weights   07/13/17 0500 07/14/17 0526 07/15/17 0455  Weight: 106.6 kg (235 lb 0.2 oz) 105.5 kg (232 lb 9.4 oz) 106.4 kg (234 lb 9.1 oz)    INITIAL HISTORY: 56 year old male with past medical history of stroke with right-sided weakness, history of type 2 diabetes, seizures, history of DVT, history of peripheral vascular disease status post right above-knee amputation presented with chest pain.  Evaluation revealed pulmonary embolism.  He was hospitalized for further management.   Procedures: Transthoracic echocardiogram Study Conclusions  - Left ventricle: The cavity size was normal. Wall thickness was increased in a pattern of moderate LVH. Systolic function was normal. The estimated ejection fraction was in the range of 55% to 60%. Wall motion was normal; there were no regional wall motion abnormalities. Doppler parameters are consistent with abnormal left ventricular relaxation (grade 1 diastolic dysfunction). The E/e&' ratio is between 8-15,  suggesting indeterminate LV filling pressure. - Aortic valve: Sclerosis without stenosis. There was no regurgitation. - Left atrium: The atrium was normal in size. - Tricuspid valve: There was mild regurgitation. - Pulmonary arteries: PA peak pressure: 37 mm Hg (S). - Inferior vena cava: The vessel was normal in size. The respirophasic diameter changes were in the normal range (>= 50%), consistent with normal central venous pressure.  Impressions:  - LVEF 55-60%, moderate LVH, normal wall motion, grade 1 DD, indeterminate LV filling pressure, aortic valve sclerosis, normal LA size, mild TR, RVSP 37 mmHg, normal IVC, no signs of RV strain.    HOSPITAL COURSE:   Acute pulmonary embolism Echocardiogram shows normal EF without any right heart strain.  Patient was initially started on heparin infusion.    Subsequently transitioned to Xarelto.  Patient had not taken his Xarelto for the last 1 month due to financial issues.  Patient seen by case management.  He does have Medicaid.  Medically stable for discharge today.  Fever The patient has had a fever of 102 F on last 2 days.  There is no obvious source of infection.  He does have a cough but chest x-ray did not show any infiltrates.  He was otherwise stable.  Fever could be due to pulmonary embolism.  Unlikely to be due to his thyroid function abnormalities.  Since patient did have a cough and could have had a respiratory source he was placed on doxycycline.  Cough is improved.  Fever appears to be subsiding.  Patient wishes to go home.  WBC remains normal.    Abdominal pain likely due to constipation This is very nonspecific.  Abdomen remains benign on examination.  Abdominal films did show constipation and the patient was started on laxatives.  However patient has been refusing to take laxatives.  He also mentioned to the nursing staff that he did not want to have a bowel movement in the hospital.  Right Thyroid  nodule Incidentally noted on CT scan.  TSH noted to be low at less than 0.01.  Free T4 is 1.32.  Free T3 is 4.2.  Ultrasound did show thyroid nodules.  Since his TSH is low patient will need to undergo radionuclide thyroid scan before pursuing biopsy.  This can be pursued as an outpatient.  He will also need to be seen by an endocrinologist.    Peripheral vascular disease He is status post right AKA.  Stable.  History of CVA with right hemiparesis Stable.  Continue home medications.  History of seizure disorder Continue Keppra.  No seizure activity in the hospital.  Diabetes mellitus type 2 in obese Appears to be diet controlled.  Overall stable.  Patient really keen on going home today.  Medically stable for discharge.  I tried calling his sister many times without success.    PERTINENT LABS:  The results of significant diagnostics from this hospitalization (including imaging, microbiology, ancillary and laboratory) are listed below for reference.    Microbiology: Recent Results (from the past 240 hour(s))  MRSA PCR Screening     Status: None   Collection Time: 07/12/17  3:03 AM  Result Value Ref Range Status   MRSA by PCR NEGATIVE NEGATIVE Final    Comment:        The GeneXpert MRSA Assay (FDA approved for NASAL specimens only), is one component of a comprehensive MRSA colonization surveillance program. It is not intended to diagnose MRSA infection nor to guide or monitor treatment for MRSA infections.   Culture, blood (routine x 2)     Status: None (Preliminary result)   Collection Time: 07/13/17  5:30 AM  Result Value Ref Range Status   Specimen Description BLOOD RIGHT ARM  Final   Special Requests IN PEDIATRIC BOTTLE Blood Culture adequate volume  Final   Culture NO GROWTH 1 DAY  Final   Report Status PENDING  Incomplete  Culture, blood (routine x 2)     Status: None (Preliminary result)   Collection Time: 07/13/17  5:35 AM  Result Value Ref Range Status    Specimen Description BLOOD LEFT HAND  Final   Special Requests   Final    BOTTLES DRAWN AEROBIC ONLY Blood Culture adequate volume   Culture NO GROWTH 1 DAY  Final   Report Status PENDING  Incomplete     Labs: Basic Metabolic Panel: Recent Labs  Lab 07/11/17 1841 07/12/17 0405 07/14/17 0320  NA 137 134* 132*  K 3.8 3.8 3.9  CL 100* 101 101  CO2 27 22 23   GLUCOSE 116* 123* 118*  BUN 9 9 10   CREATININE 0.83 0.79 0.84  CALCIUM 9.0 8.6* 8.4*   CBC: Recent Labs  Lab 07/11/17 1841 07/12/17 0405 07/13/17 0518 07/14/17 0320  WBC 9.7 10.5 9.6 9.0  HGB 14.5 13.9 13.5 13.0  HCT 42.3 39.4 39.5 37.7*  MCV 91.8 90.0 89.4 89.8  PLT 132* 142* 151 142*   Cardiac Enzymes: Recent Labs  Lab 07/12/17 0044 07/12/17 0405 07/12/17 1052  TROPONINI <0.03 <0.03 <0.03    CBG: Recent Labs  Lab 07/14/17 1249 07/14/17 1625 07/14/17 2202 07/15/17 0753 07/15/17 1108  GLUCAP 135* 141* 124* 118* 129*     IMAGING STUDIES  Dg Chest 2 View  Result Date: 07/11/2017 CLINICAL DATA:  Initial evaluation for acute central chest pain. EXAM: CHEST  2 VIEW COMPARISON:  Prior radiograph from 01/24/2017. FINDINGS: Mild cardiomegaly, stable. Mediastinal silhouette within normal limits. Lungs are hypoinflated. Mild patchy and linear left basilar opacity, favored to reflect atelectasis. No other consolidative opacity. No pulmonary edema for pleural effusion. No pneumothorax. No acute osseus abnormality. No acute osseus abnormality. IMPRESSION: 1. Shallow lung inflation with mild left basilar atelectasis. 2. No other active cardiopulmonary disease. Electronically Signed   By: Rise Mu M.D.   On: 07/11/2017 19:24   Ct Angio Chest Pe W And/or Wo Contrast  Result Date: 07/11/2017 CLINICAL DATA:  Chest pressure starting 2 weeks ago, present intermittently. Symptoms became persistent 2 days ago. EXAM: CT ANGIOGRAPHY CHEST WITH CONTRAST TECHNIQUE: Multidetector CT imaging of the chest was performed  using the standard protocol during bolus administration of intravenous contrast. Multiplanar CT image reconstructions and MIPs were obtained to evaluate the vascular anatomy. CONTRAST:  ISOVUE-370 IOPAMIDOL (ISOVUE-370) INJECTION 76% COMPARISON:  None. FINDINGS: Cardiovascular: Filling defects within the bilateral lower lobe pulmonary arteries, branching into bilateral lower lobe segmental vessels with occlusive clot seen bilaterally. The has both central and eccentric appearance compatible with acute to subacute historical timing. RV to LV ratio is upper normal at 0.9. This ratio is likely accentuated by hpertrophic appearance of the left ventricle. No acute finding in the faintly opacified aorta. No pericardial effusion. Mediastinum/Nodes: 2.5 cm right thyroid nodule. Although difficult to visualize today there is a definite nodule based on neck CTA 02/04/2016, with similar dimensions to prior. Lungs/Pleura: Asymmetric ground-glass density and reticulation in the left lower lobe which is considered ischemic. Lung volumes are low and there is bilateral subsegmental atelectasis. Trace pleural fluid along the left lower lobe. Upper Abdomen: No acute finding. Musculoskeletal: No acute or aggressive finding. Critical Value/emergent results were called by telephone at the time of interpretation on 07/11/2017 at 10:28 pm to Dr. Shirlyn Goltz , who verbally acknowledged these results. Review of the MIP images confirms the above findings. IMPRESSION: 1. Positive for acute to subacute pulmonary embolism in the bilateral lower lobar and segmental arteries. Infarct in the left lower lobe. Negative for right heart strain. 2. Hypertrophic appearance of the left ventricle. 3. 2.5 cm right thyroid nodule, also noted on neck CTA 02/04/2016. Sonography is recommended if not already performed. Electronically Signed   By: Marnee Spring M.D.   On: 07/11/2017 22:32   Dg Chest Port 1 View  Result Date: 07/13/2017 CLINICAL  DATA:  Fever EXAM: PORTABLE CHEST 1 VIEW COMPARISON:  None. FINDINGS: Lungs are very under aerated. Bibasilar atelectasis is worse. Left pleural effusion is suspected. Heart is normal in size allowing for low volumes. No pneumothorax. IMPRESSION: Worsening bibasilar atelectasis. Electronically Signed   By: Jolaine Click M.D.   On: 07/13/2017 09:01   Dg Abd 2 Views  Result Date: 07/13/2017 CLINICAL DATA:  56 year old male with left side abdominal pain. No recent bowel movement. EXAM: ABDOMEN - 2 VIEW COMPARISON:  Chest CTA 07/11/2017. FINDINGS: Upright and supine views of the abdomen and pelvis. Continued confluent left lung base opacity. Increased patchy opacity at the right lung base. Left chest cardiac loop recorder re- demonstrated. No pneumoperitoneum. Non obstructed bowel gas pattern. Gas and stool throughout the left and distal colon. Abdominal and pelvic visceral contours appear normal. Disc and endplate degeneration in the lumbar spine. No acute osseous abnormality identified. IMPRESSION: 1. Normal bowel gas  pattern, no free air. Retained stool in the descending and sigmoid colon. 2. Continued left greater than right lung base opacity. Electronically Signed   By: Odessa FlemingH  Hall M.D.   On: 07/13/2017 13:26   [place] Thyroid  Result Date: 07/13/2017 CLINICAL DATA:  Thyroid nodule by recent CT EXAM: THYROID ULTRASOUND TECHNIQUE: Ultrasound examination of the thyroid gland and adjacent soft tissues was performed. COMPARISON:  07/11/2017 FINDINGS: Parenchymal Echotexture: Mildly heterogenous Isthmus: 3 mm Right lobe: 6.2 x 3.3 x 3.3 cm Left lobe: 5.0 x 1.7 x 1.3 cm _________________________________________________________ Estimated total number of nodules >/= 1 cm: 1 Number of spongiform nodules >/=  2 cm not described below (TR1): 0 Number of mixed cystic and solid nodules >/= 1.5 cm not described below (TR2): 0 _________________________________________________________ Nodule # 1: Location: Right; Mid Maximum size:  3.9 cm; Other 2 dimensions: 3.0 x 3.0 cm Composition: solid/almost completely solid (2) Echogenicity: isoechoic (1) Shape: not taller-than-wide (0) Margins: ill-defined (0) Echogenic foci: none (0) ACR TI-RADS total points: 3. ACR TI-RADS risk category: TR3 (3 points). ACR TI-RADS recommendations: **Given size (>/= 2.5 cm) and appearance, fine needle aspiration of this mildly suspicious nodule should be considered based on TI-RADS criteria. _________________________________________________________ Small parenchymal calcification noted in the left lobe inferiorly measuring less than 2 mm. IMPRESSION: 3.9 cm right mid thyroid TR 3 nodule correlates with the CT finding. This meets criteria for biopsy as above. The above is in keeping with the ACR TI-RADS recommendations - J Am Coll Radiol 2017;14:587-595. Electronically Signed   By: Judie PetitM.  Shick M.D.   On: 07/13/2017 20:24    DISCHARGE EXAMINATION: Vitals:   07/14/17 2201 07/15/17 0055 07/15/17 0455 07/15/17 0845  BP: 122/80 128/75 107/62 124/78  Pulse:  79 69 73  Resp:  (!) 22 19 18   Temp:  100 F (37.8 C) 98.2 F (36.8 C) 99 F (37.2 C)  TempSrc:  Oral Oral Oral  SpO2:  93% 94% 97%  Weight:   106.4 kg (234 lb 9.1 oz)   Height:       General appearance: alert, cooperative, appears stated age and no distress Resp: clear to auscultation bilaterally Cardio: regular rate and rhythm, S1, S2 normal, no murmur, click, rub or gallop GI: soft, non-tender; bowel sounds normal; no masses,  no organomegaly Extremities: extremities normal, atraumatic, no cyanosis or edema  DISPOSITION: Home with family  Discharge Instructions    Call MD for:  difficulty breathing, headache or visual disturbances   Complete by:  As directed    Call MD for:  extreme fatigue   Complete by:  As directed    Call MD for:  persistant dizziness or light-headedness   Complete by:  As directed    Call MD for:  persistant nausea and vomiting   Complete by:  As directed    Call  MD for:  temperature >100.4   Complete by:  As directed    Diet - low sodium heart healthy   Complete by:  As directed    Discharge instructions   Complete by:  As directed    Please take your medications as prescribed.  Be sure to follow-up with your primary care provider within 1 week to discuss further management of the thyroid nodule.  You will need to undergo radionuclide thyroid scan at some point in time.  You may also have to be referred to an endocrinologist.  Please take laxatives as prescribed.  Be sure to avoid constipation which is likely reason for your  abdominal discomfort.  You were cared for by a hospitalist during your hospital stay. If you have any questions about your discharge medications or the care you received while you were in the hospital after you are discharged, you can call the unit and asked to speak with the hospitalist on call if the hospitalist that took care of you is not available. Once you are discharged, your primary care physician will handle any further medical issues. Please note that NO REFILLS for any discharge medications will be authorized once you are discharged, as it is imperative that you return to your primary care physician (or establish a relationship with a primary care physician if you do not have one) for your aftercare needs so that they can reassess your need for medications and monitor your lab values. If you do not have a primary care physician, you can call [phone] for a physician referral.   Increase activity slowly   Complete by:  As directed       ALLERGIES: No Known Allergies   Allergies as of 07/15/2017   No Known Allergies     Medication List    TAKE these medications   aspirin EC 81 MG tablet Take 81 mg by mouth daily.   atorvastatin 40 MG tablet Commonly known as:  LIPITOR Take 1 tablet (40 mg total) by mouth daily at 6 PM.   doxycycline 100 MG tablet Commonly known as:  VIBRA-TABS Take 1 tablet (100 mg total) by  mouth every 12 (twelve) hours for 5 days.   levETIRAcetam 500 MG tablet Commonly known as:  KEPPRA Take 1 tablet (500 mg total) by mouth 2 (two) times daily.   polyethylene glycol packet Commonly known as:  MIRALAX / GLYCOLAX Take 17 g by mouth 2 (two) times daily.   Rivaroxaban 15 & 20 MG Tbpk Take as directed on package: Start with one 15mg  tablet by mouth twice a day with food. On Day 22, switch to one 20mg  tablet once a day with food. What changed:  You were already taking a medication with the same name, and this prescription was added. Make sure you understand how and when to take each. Notes to patient:  Twice a day until December 20th than 20 mg daily starting December 21th.    rivaroxaban 20 MG Tabs tablet Commonly known as:  XARELTO Take 1 tablet (20 mg total) by mouth daily with supper. START ONLY AFTER YOU HAVE COMPLETED THE STARTER PACK What changed:  additional instructions        Follow-up Information    Loletta Specter, PA-C. Go on 07/25/2017.   Specialty:  Physician Assistant Why:  First available appointment @ 3:30 for hospital f/u with Dr. Lily Kocher- office to call if has a cancellation.  Contact information: [address] [place] [address] [phone]        [facility]. Go to.   Why:  this locaiton for assistance with Medications if needed since you are established with the [facility].  Contact information: [address] [place] [address] [phone]          TOTAL DISCHARGE TIME: 35 mins  Osvaldo Shipper  [facility] Pager [phone]  07/15/2017, 2:23 PM

## 2017-07-15 NOTE — Care Management Note (Signed)
Case Management Note  Patient Details  Name: Ronald Forbes MRN: [id number] Date of Birth: [birth year]  Subjective/Objective:   Pt presented for SOB- positive for Pulmonary Embolism. Pt had been taken off of some medications per Ronald Forbes [phone]). [facility] Hospital Follow Up appointment placed on AVS- first available-office to call with cancellations.                  Action/Plan: Per sister Ronald Forbes she is the patient's caregiver. Pt has DME Wheelchair @ home. Sister uses a [facility] claims handlerlocal pharmacy for medications- CM did discuss Delivery Pharmacy and sister declined. [facility] if he can not get to local pharmacy as well. CM did receive consult in regards to Medications- unable to assist any further due to Medicaid and cost is less than $3.00. [facility] sister also declined states she is always with the patient and can monitor his medications. [facility]. No further needs from CM at this time.   Expected Discharge Date:  07/15/17               Expected Discharge Plan:  Home/Self Care  In-House Referral:  NA  Discharge planning Services  CM Consult, Medication Assistance, Indigent [facility] Clinic, Follow-up appt scheduled  Post Acute Care Choice:  NA Choice offered to:  NA  DME Arranged:  N/A DME Agency:  NA  HH Arranged:  NA HH Agency:  NA  Status of Service:  Completed, signed off  If discussed at Long Length of Stay Meetings, dates discussed:    Additional Comments:  Ronald Forbes, Ronald Wissink Kaye, RN 07/15/2017, 11:34 AM

## 2017-07-18 LAB — CULTURE, BLOOD (ROUTINE X 2)
Culture: NO GROWTH
Culture: NO GROWTH
Special Requests: ADEQUATE
Special Requests: ADEQUATE

## 2017-07-25 ENCOUNTER — Inpatient Hospital Stay (INDEPENDENT_AMBULATORY_CARE_PROVIDER_SITE_OTHER): Payer: Medicaid Other | Admitting: Physician Assistant

## 2017-08-10 ENCOUNTER — Encounter (INDEPENDENT_AMBULATORY_CARE_PROVIDER_SITE_OTHER): Payer: Self-pay | Admitting: Physician Assistant

## 2017-08-10 ENCOUNTER — Ambulatory Visit (INDEPENDENT_AMBULATORY_CARE_PROVIDER_SITE_OTHER): Payer: Medicaid Other | Admitting: Physician Assistant

## 2017-08-10 VITALS — BP 138/73 | HR 66 | Temp 98.3°F | Resp 20 | Ht 75.0 in | Wt 227.0 lb

## 2017-08-10 DIAGNOSIS — Z23 Encounter for immunization: Secondary | ICD-10-CM | POA: Diagnosis not present

## 2017-08-10 DIAGNOSIS — Z79899 Other long term (current) drug therapy: Secondary | ICD-10-CM

## 2017-08-10 DIAGNOSIS — Z1211 Encounter for screening for malignant neoplasm of colon: Secondary | ICD-10-CM | POA: Diagnosis not present

## 2017-08-10 DIAGNOSIS — K5900 Constipation, unspecified: Secondary | ICD-10-CM

## 2017-08-10 DIAGNOSIS — S78119A Complete traumatic amputation at level between unspecified hip and knee, initial encounter: Secondary | ICD-10-CM

## 2017-08-10 DIAGNOSIS — E119 Type 2 diabetes mellitus without complications: Secondary | ICD-10-CM | POA: Diagnosis not present

## 2017-08-10 DIAGNOSIS — E785 Hyperlipidemia, unspecified: Secondary | ICD-10-CM

## 2017-08-10 DIAGNOSIS — I2609 Other pulmonary embolism with acute cor pulmonale: Secondary | ICD-10-CM | POA: Diagnosis not present

## 2017-08-10 DIAGNOSIS — Z89619 Acquired absence of unspecified leg above knee: Secondary | ICD-10-CM | POA: Diagnosis not present

## 2017-08-10 MED ORDER — ATORVASTATIN CALCIUM 40 MG PO TABS: 40.0000 mg | ORAL_TABLET | Freq: Every day | ORAL | 3 refills | Status: DC

## 2017-08-10 MED ORDER — DOCUSATE SODIUM 50 MG PO CAPS: 50.0000 mg | ORAL_CAPSULE | Freq: Two times a day (BID) | ORAL | 0 refills | Status: DC

## 2017-08-10 MED ORDER — RIVAROXABAN 20 MG PO TABS: 20.0000 mg | ORAL_TABLET | Freq: Every day | ORAL | 3 refills | Status: DC

## 2017-08-10 NOTE — Progress Notes (Signed)
Subjective:  Patient ID: Ronald Forbes, male    DOB: 07-22-1961  Age: 56 y.o. MRN: [id number]  CC: hospital f/u  HPI  Ronald Forbes is a 56 y.o. male with a medical history of right AKA in the setting of DM2,  PAD, PE, HLD, seizures, CVA, Tobacco absuse, and ETOH abuse presents on hospital follow up for PE. Admitted from Says he runs out of Xarelto at times. Spent two weeks without Xarelto which eventually led him to be admitted. Reports feeling much better, not coughing, and pain in lower right chest has resolved. Sister accompanies patient today and says pt will soon need a refill of Xarelto.      Outpatient Medications Prior to Visit  Medication Sig Dispense Refill  . aspirin EC 81 MG tablet Take 81 mg by mouth daily.    Marland Kitchen. atorvastatin (LIPITOR) 40 MG tablet Take 1 tablet (40 mg total) by mouth daily at 6 PM. 90 tablet 3  . levETIRAcetam (KEPPRA) 500 MG tablet Take 1 tablet (500 mg total) by mouth 2 (two) times daily. 180 tablet 3  . polyethylene glycol (MIRALAX / GLYCOLAX) packet Take 17 g by mouth 2 (two) times daily. 30 each 0  . rivaroxaban (XARELTO) 20 MG TABS tablet Take 1 tablet (20 mg total) by mouth daily with supper. START ONLY AFTER YOU HAVE COMPLETED THE STARTER PACK 30 tablet 2  . Rivaroxaban 15 & 20 MG TBPK Take as directed on package: Start with one 15mg  tablet by mouth twice a day with food. On Day 22, switch to one 20mg  tablet once a day with food. 51 each 0   No facility-administered medications prior to visit.      ROS Review of Systems  Constitutional: Negative for chills, fever and malaise/fatigue.  Eyes: Negative for blurred vision.  Respiratory: Negative for shortness of breath.   Cardiovascular: Negative for chest pain and palpitations.  Gastrointestinal: Negative for abdominal pain and nausea.  Genitourinary: Negative for dysuria and hematuria.  Musculoskeletal: Negative for joint pain and myalgias.  Skin: Negative for rash.  Neurological: Negative  for tingling and headaches.  Psychiatric/Behavioral: Negative for depression. The patient is not nervous/anxious.     Objective:  There were no vitals taken for this visit.  BP/Weight 07/15/2017 04/22/2017 03/11/2017  Systolic BP 124 - 136  Diastolic BP 78 - 82  Wt. (Lbs) 234.57 221 229.8  BMI 29.32 27.62 28.72      Physical Exam  Constitutional: He is oriented to person, place, and time.  Well developed, well nourished, NAD, polite, using wheelchair  HENT:  Head: Normocephalic and atraumatic.  Eyes: No scleral icterus.  Neck: Normal range of motion. Neck supple. No thyromegaly present.  Cardiovascular: Normal rate, regular rhythm and normal heart sounds.  No murmur heard. No carotid bruits  Pulmonary/Chest: Effort normal and breath sounds normal. No respiratory distress. He has no wheezes.  Abdominal: Soft. Bowel sounds are normal. There is no tenderness.  Musculoskeletal: He exhibits no edema.  Right AKA  Neurological: He is alert and oriented to person, place, and time. No cranial nerve deficit. Coordination normal.  Skin: Skin is warm and dry. No rash noted. No erythema. No pallor.  Dry skin on hands. Lips dry and mildly cracked.  Psychiatric: He has a normal mood and affect. His behavior is normal. Thought content normal.  Vitals reviewed.    Assessment & Plan:    1. Type 2 diabetes mellitus without complication, without long-term current use of insulin (  HCC) - Microalbumin / creatinine urine ratio - Counseled on diabetic foot care.  2. Other acute pulmonary embolism with acute cor pulmonale (HCC) - Continue on Xarelto. Refilled 20 mg dose today.  3. Hyperlipidemia, unspecified hyperlipidemia type - atorvastatin (LIPITOR) 40 MG tablet; Take 1 tablet (40 mg total) by mouth daily at 6 PM.  Dispense: 90 tablet; Refill: 3 - Lipid panel  4. Need for Tdap vaccination - Tdap vaccine greater than or equal to 7yo IM  5. Screen for colon cancer - FIT  6. High risk  medication use - CBC with Differential  7. Unilateral AKA (HCC) - Refill rivaroxaban (XARELTO) 20 MG TABS tablet; Take 1 tablet (20 mg total) by mouth daily with supper. START ONLY AFTER YOU HAVE COMPLETED THE STARTER PACK  Dispense: 90 tablet; Refill: 3  8. Constipation, unspecified constipation type - Begin docusate sodium (COLACE) 50 MG capsule; Take 1 capsule (50 mg total) by mouth 2 (two) times daily.  Dispense: 10 capsule; Refill: 0 - Advised patient to hydrate properly; drink 8 cups of water per day.   9. Need for prophylactic vaccination against Streptococcus pneumoniae (pneumococcus) - Pneumococcal conjugate vaccine 13-valent   Meds ordered this encounter  Medications  . atorvastatin (LIPITOR) 40 MG tablet    Sig: Take 1 tablet (40 mg total) by mouth daily at 6 PM.    Dispense:  90 tablet    Refill:  3    Order Specific Question:   Supervising Provider    Answer:   Quentin AngstJEGEDE, OLUGBEMIGA E [id number]]  . rivaroxaban (XARELTO) 20 MG TABS tablet    Sig: Take 1 tablet (20 mg total) by mouth daily with supper. START ONLY AFTER YOU HAVE COMPLETED THE STARTER PACK    Dispense:  90 tablet    Refill:  3    Order Specific Question:   Supervising Provider    Answer:   Quentin AngstJEGEDE, OLUGBEMIGA E [id number]  . docusate sodium (COLACE) 50 MG capsule    Sig: Take 1 capsule (50 mg total) by mouth 2 (two) times daily.    Dispense:  10 capsule    Refill:  0    Order Specific Question:   Supervising Provider    Answer:   Quentin AngstJEGEDE, OLUGBEMIGA E [[id number]]    Follow-up: 6 months   Loletta Specteroger David Gomez PA

## 2017-08-10 NOTE — Patient Instructions (Addendum)
Diabetes and Foot Care Diabetes may cause you to have problems because of poor blood supply (circulation) to your feet and legs. This may cause the skin on your feet to become thinner, break easier, and heal more slowly. Your skin may become dry, and the skin may peel and crack. You may also have nerve damage in your legs and feet causing decreased feeling in them. You may not notice minor injuries to your feet that could lead to infections or more serious problems. Taking care of your feet is one of the most important things you can do for yourself. Follow these instructions at home:  Wear shoes at all times, even in the house. Do not go barefoot. Bare feet are easily injured.  Check your feet daily for blisters, cuts, and redness. If you cannot see the bottom of your feet, use a mirror or ask someone for help.  Wash your feet with warm water (do not use hot water) and mild soap. Then pat your feet and the areas between your toes until they are completely dry. Do not soak your feet as this can dry your skin.  Apply a moisturizing lotion or petroleum jelly (that does not contain alcohol and is unscented) to the skin on your feet and to dry, brittle toenails. Do not apply lotion between your toes.  Trim your toenails straight across. Do not dig under them or around the cuticle. File the edges of your nails with an emery board or nail file.  Do not cut corns or calluses or try to remove them with medicine.  Wear clean socks or stockings every day. Make sure they are not too tight. Do not wear knee-high stockings since they may decrease blood flow to your legs.  Wear shoes that fit properly and have enough cushioning. To break in new shoes, wear them for just a few hours a day. This prevents you from injuring your feet. Always look in your shoes before you put them on to be sure there are no objects inside.  Do not cross your legs. This may decrease the blood flow to your feet.  If you find a  minor scrape, cut, or break in the skin on your feet, keep it and the skin around it clean and dry. These areas may be cleansed with mild soap and water. Do not cleanse the area with peroxide, alcohol, or iodine.  When you remove an adhesive bandage, be sure not to damage the skin around it.  If you have a wound, look at it several times a day to make sure it is healing.  Do not use heating pads or hot water bottles. They may burn your skin. If you have lost feeling in your feet or legs, you may not know it is happening until it is too late.  Make sure your health care provider performs a complete foot exam at least annually or more often if you have foot problems. Report any cuts, sores, or bruises to your health care provider immediately. Contact a health care provider if:  You have an injury that is not healing.  You have cuts or breaks in the skin.  You have an ingrown nail.  You notice redness on your legs or feet.  You feel burning or tingling in your legs or feet.  You have pain or cramps in your legs and feet.  Your legs or feet are numb.  Your feet always feel cold. Get help right away if:  There is increasing   redness, swelling, or pain in or around a wound.  There is a red line that goes up your leg.  Pus is coming from a wound.  You develop a fever or as directed by your health care provider.  You notice a bad smell coming from an ulcer or wound. This information is not intended to replace advice given to you by your health care provider. Make sure you discuss any questions you have with your health care provider. Document Released: 07/30/2000 Document Revised: 01/08/2016 Document Reviewed: 01/09/2013 Elsevier Interactive Patient Education  2017 Elsevier Inc. Td Vaccine (Tetanus and Diphtheria): What You Need to Know 1. Why get vaccinated? Tetanus  and diphtheria are very serious diseases. They are rare in the [place] today, but people who do become  infected often have severe complications. Td vaccine is used to protect adolescents and adults from both of these diseases. Both tetanus and diphtheria are infections caused by bacteria. Diphtheria spreads from person to person through coughing or sneezing. Tetanus-causing bacteria enter the body through cuts, scratches, or wounds. TETANUS (lockjaw) causes painful muscle tightening and stiffness, usually all over the body.  It can lead to tightening of muscles in the head and neck so you can't open your mouth, swallow, or sometimes even breathe. Tetanus kills about 1 out of every 10 people who are infected even after receiving the best medical care.  DIPHTHERIA can cause a thick coating to form in the back of the throat.  It can lead to breathing problems, paralysis, heart failure, and death.  Before vaccines, as many as 200,000 cases of diphtheria and hundreds of cases of tetanus were reported in the [place] each year. Since vaccination began, reports of cases for both diseases have dropped by about 99%. 2. Td vaccine Td vaccine can protect adolescents and adults from tetanus and diphtheria. Td is usually given as a booster dose every 10 years but it can also be given earlier after a severe and dirty wound or burn. Another vaccine, called Tdap, which protects against pertussis in addition to tetanus and diphtheria, is sometimes recommended instead of Td vaccine. Your doctor or the person giving you the vaccine can give you more information. Td may safely be given at the same time as other vaccines. 3. Some people should not get this vaccine  A person who has ever had a life-threatening allergic reaction after a previous dose of any tetanus or diphtheria containing vaccine, OR has a severe allergy to any part of this vaccine, should not get Td vaccine. Tell the person giving the vaccine about any severe allergies.  Talk to your doctor if you: ? had severe pain or swelling after any vaccine  containing diphtheria or tetanus, ? ever had a condition called Guillain Barre Syndrome (GBS), ? aren't feeling well on the day the shot is scheduled. 4. What are the risks from Td vaccine? With any medicine, including vaccines, there is a chance of side effects. These are usually mild and go away on their own. Serious reactions are also possible but are rare. Most people who get Td vaccine do not have any problems with it. Mild problems following Td vaccine: (Did not interfere with activities)  Pain where the shot was given (about 8 people in 10)  Redness or swelling where the shot was given (about 1 person in 4)  Mild fever (rare)  Headache (about 1 person in 4)  Tiredness (about 1 person in 4)  Moderate problems following Td vaccine: (Interfered  with activities, but did not require medical attention)  Fever over 102F (rare)  Severe problems following Td vaccine: (Unable to perform usual activities; required medical attention)  Swelling, severe pain, bleeding and/or redness in the arm where the shot was given (rare).  Problems that could happen after any vaccine:  People sometimes faint after a medical procedure, including vaccination. Sitting or lying down for about 15 minutes can help prevent fainting, and injuries caused by a fall. Tell your doctor if you feel dizzy, or have vision changes or ringing in the ears.  Some people get severe pain in the shoulder and have difficulty moving the arm where a shot was given. This happens very rarely.  Any medication can cause a severe allergic reaction. Such reactions from a vaccine are very rare, estimated at fewer than 1 in a million doses, and would happen within a few minutes to a few hours after the vaccination. As with any medicine, there is a very remote chance of a vaccine causing a serious injury or death. The safety of vaccines is always being monitored. For more information, visit: http://floyd.org/www.cdc.gov/vaccinesafety/ 5. What if  there is a serious reaction? What should I look for? Look for anything that concerns you, such as signs of a severe allergic reaction, very high fever, or unusual behavior. Signs of a severe allergic reaction can include hives, swelling of the face and throat, difficulty breathing, a fast heartbeat, dizziness, and weakness. These would usually start a few minutes to a few hours after the vaccination. What should I do?  If you think it is a severe allergic reaction or other emergency that can't wait, call 9-1-1 or get the person to the nearest hospital. Otherwise, call your doctor.  Afterward, the reaction should be reported to the Vaccine Adverse Event Reporting System (VAERS). Your doctor might file this report, or you can do it yourself through the VAERS web site at www.vaers.LAgents.nohhs.gov, or by calling 1-775 848 0726. ? VAERS does not give medical advice. 6. The National Vaccine Injury Compensation Program The [employer] Vaccine Injury Compensation Program (VICP) is a federal program that was created to compensate people who may have been injured by certain vaccines. Persons who believe they may have been injured by a vaccine can learn about the program and about filing a claim by calling 1-6197745023 or visiting the VICP website at SpiritualWord.atwww.hrsa.gov/vaccinecompensation. There is a time limit to file a claim for compensation. 7. How can I learn more?  Ask your doctor. He or she can give you the vaccine package insert or suggest other sources of information.  Call your local or state health department.  Contact the Centers for Disease Control and Prevention (CDC): ? Call 48453491461-4580945473 (1-800-CDC-INFO) ? Visit CDC's website at PicCapture.uywww.cdc.gov/vaccines CDC Td Vaccine VIS (11/25/15) This information is not intended to replace advice given to you by your health care provider. Make sure you discuss any questions you have with your health care provider. Document Released: 05/30/2006 Document Revised:  04/22/2016 Document Reviewed: 04/22/2016 Elsevier Interactive Patient Education  2017 Elsevier Inc. Pneumococcal Conjugate Vaccine suspension for injection What is this medicine? PNEUMOCOCCAL VACCINE (NEU mo KOK al vak SEEN) is a vaccine used to prevent pneumococcus bacterial infections. These bacteria can cause serious infections like pneumonia, meningitis, and blood infections. This vaccine will lower your chance of getting pneumonia. If you do get pneumonia, it can make your symptoms milder and your illness shorter. This vaccine will not treat an infection and will not cause infection. This vaccine is recommended  for infants and young children, adults with certain medical conditions, and adults 65 years or older. This medicine may be used for other purposes; ask your health care provider or pharmacist if you have questions. COMMON BRAND NAME(S): Prevnar, Prevnar 13 What should I tell my health care provider before I take this medicine? They need to know if you have any of these conditions: -bleeding problems -fever -immune system problems -an unusual or allergic reaction to pneumococcal vaccine, diphtheria toxoid, other vaccines, latex, other medicines, foods, dyes, or preservatives -pregnant or trying to get pregnant -breast-feeding How should I use this medicine? This vaccine is for injection into a muscle. It is given by a health care professional. A copy of Vaccine Information Statements will be given before each vaccination. Read this sheet carefully each time. The sheet may change frequently. Talk to your pediatrician regarding the use of this medicine in children. While this drug may be prescribed for children as young as 68 weeks old for selected conditions, precautions do apply. Overdosage: If you think you have taken too much of this medicine contact a poison control center or emergency room at once. NOTE: This medicine is only for you. Do not share this medicine with others. What  if I miss a dose? It is important not to miss your dose. Call your doctor or health care professional if you are unable to keep an appointment. What may interact with this medicine? -medicines for cancer chemotherapy -medicines that suppress your immune function -steroid medicines like prednisone or cortisone This list may not describe all possible interactions. Give your health care provider a list of all the medicines, herbs, non-prescription drugs, or dietary supplements you use. Also tell them if you smoke, drink alcohol, or use illegal drugs. Some items may interact with your medicine. What should I watch for while using this medicine? Mild fever and pain should go away in 3 days or less. Report any unusual symptoms to your doctor or health care professional. What side effects may I notice from receiving this medicine? Side effects that you should report to your doctor or health care professional as soon as possible: -allergic reactions like skin rash, itching or hives, swelling of the face, lips, or tongue -breathing problems -confused -fast or irregular heartbeat -fever over 102 degrees F -seizures -unusual bleeding or bruising -unusual muscle weakness Side effects that usually do not require medical attention (report to your doctor or health care professional if they continue or are bothersome): -aches and pains -diarrhea -fever of 102 degrees F or less -headache -irritable -loss of appetite -pain, tender at site where injected -trouble sleeping This list may not describe all possible side effects. Call your doctor for medical advice about side effects. You may report side effects to FDA at 1-800-FDA-1088. Where should I keep my medicine? This does not apply. This vaccine is given in a clinic, pharmacy, doctor's office, or other health care setting and will not be stored at home. NOTE: This sheet is a summary. It may not cover all possible information. If you have questions  about this medicine, talk to your doctor, pharmacist, or health care provider.  2018 Elsevier/Gold Standard (2014-05-09 10:27:27)

## 2017-08-11 ENCOUNTER — Telehealth (INDEPENDENT_AMBULATORY_CARE_PROVIDER_SITE_OTHER): Payer: Self-pay

## 2017-08-11 LAB — LIPID PANEL
CHOLESTEROL TOTAL: 185 mg/dL (ref 100–199)
Chol/HDL Ratio: 6.4 ratio — ABNORMAL HIGH (ref 0.0–5.0)
HDL: 29 mg/dL — AB (ref >39)
LDL CALC: 98 mg/dL (ref 0–99)
TRIGLYCERIDES: 290 mg/dL — AB (ref 0–149)
VLDL CHOLESTEROL CAL: 58 mg/dL — AB (ref 5–40)

## 2017-08-11 LAB — CBC WITH DIFFERENTIAL/PLATELET
BASOS ABS: 0 10*3/uL (ref 0.0–0.2)
Basos: 0 %
EOS (ABSOLUTE): 0.3 10*3/uL (ref 0.0–0.4)
Eos: 4 %
HEMOGLOBIN: 14.2 g/dL (ref 13.0–17.7)
Hematocrit: 41.6 % (ref 37.5–51.0)
IMMATURE GRANS (ABS): 0 10*3/uL (ref 0.0–0.1)
IMMATURE GRANULOCYTES: 0 %
LYMPHS: 40 %
Lymphocytes Absolute: 2.7 10*3/uL (ref 0.7–3.1)
MCH: 30.7 pg (ref 26.6–33.0)
MCHC: 34.1 g/dL (ref 31.5–35.7)
MCV: 90 fL (ref 79–97)
MONOCYTES: 6 %
Monocytes Absolute: 0.4 10*3/uL (ref 0.1–0.9)
NEUTROS ABS: 3.3 10*3/uL (ref 1.4–7.0)
NEUTROS PCT: 50 %
PLATELETS: 217 10*3/uL (ref 150–379)
RBC: 4.63 x10E6/uL (ref 4.14–5.80)
RDW: 13.7 % (ref 12.3–15.4)
WBC: 6.6 10*3/uL (ref 3.4–10.8)

## 2017-08-11 LAB — MICROALBUMIN / CREATININE URINE RATIO
Creatinine, Urine: 184.4 mg/dL
Microalb/Creat Ratio: 20.9 mg/g creat (ref 0.0–30.0)
Microalbumin, Urine: 38.5 ug/mL

## 2017-08-11 NOTE — Telephone Encounter (Signed)
Left patient a message detailing the following results, CBC is normal. Lipids are better. I would recommend that he reduce the amount of sweets he consumes to include soda. Maryjean Mornempestt S Aileana Hodder, CMA

## 2017-08-11 NOTE — Telephone Encounter (Signed)
-----   Message from Loletta Specteroger David Gomez, PA-C sent at 08/11/2017  8:32 AM EST ----- CBC is normal. Lipids are better. I would recommend that he reduce the amount of sweets he consumes to include soda.

## 2017-08-12 ENCOUNTER — Inpatient Hospital Stay (INDEPENDENT_AMBULATORY_CARE_PROVIDER_SITE_OTHER): Payer: Medicaid Other | Admitting: Physician Assistant

## 2017-08-29 ENCOUNTER — Ambulatory Visit: Payer: Medicaid Other | Admitting: Physical Therapy

## 2017-09-02 ENCOUNTER — Telehealth (INDEPENDENT_AMBULATORY_CARE_PROVIDER_SITE_OTHER): Payer: Self-pay | Admitting: Physician Assistant

## 2017-09-02 NOTE — Telephone Encounter (Signed)
Pt sister call to try to speak with the nurse or the pcp, since her brother has one good leg an he having problem standing up, leg getting sore, she would like to know what she can do for him, please follow up

## 2017-09-02 NOTE — Telephone Encounter (Signed)
Spoke with sister and scheduled appt for patient on 1//21. Maryjean Mornempestt S Roberts, CMA

## 2017-09-05 ENCOUNTER — Encounter (INDEPENDENT_AMBULATORY_CARE_PROVIDER_SITE_OTHER): Payer: Self-pay | Admitting: Physician Assistant

## 2017-09-05 ENCOUNTER — Other Ambulatory Visit: Payer: Self-pay

## 2017-09-05 ENCOUNTER — Telehealth: Payer: Self-pay

## 2017-09-05 ENCOUNTER — Ambulatory Visit (HOSPITAL_COMMUNITY)
Admission: RE | Admit: 2017-09-05 | Discharge: 2017-09-05 | Disposition: A | Discharge status: Home / Self Care | Payer: Medicaid Other | Source: Ambulatory Visit | Attending: Physician Assistant | Admitting: Physician Assistant

## 2017-09-05 ENCOUNTER — Ambulatory Visit (INDEPENDENT_AMBULATORY_CARE_PROVIDER_SITE_OTHER): Payer: Medicaid Other | Admitting: Physician Assistant

## 2017-09-05 VITALS — BP 153/84 | HR 68 | Temp 97.5°F

## 2017-09-05 DIAGNOSIS — M7989 Other specified soft tissue disorders: Secondary | ICD-10-CM | POA: Insufficient documentation

## 2017-09-05 DIAGNOSIS — S99912A Unspecified injury of left ankle, initial encounter: Secondary | ICD-10-CM | POA: Diagnosis present

## 2017-09-05 DIAGNOSIS — X58XXXA Exposure to other specified factors, initial encounter: Secondary | ICD-10-CM | POA: Diagnosis not present

## 2017-09-05 DIAGNOSIS — K5900 Constipation, unspecified: Secondary | ICD-10-CM | POA: Diagnosis not present

## 2017-09-05 MED ORDER — ACETAMINOPHEN 500 MG PO TABS: 1000.0000 mg | ORAL_TABLET | Freq: Three times a day (TID) | ORAL | 0 refills | Status: AC | PRN

## 2017-09-05 NOTE — Progress Notes (Signed)
Subjective:  Patient ID: Ronald Forbes, male    DOB: 03/24/1961  Age: 57 y.o. 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 Patient does not know what may have caused his ankle pain. Pt is accompanied by sister who states patient fell in the tub. Pt does remember a fall in the tub but also does not remember losing consciousness. Painful to bear weight. Pain located along the distal aspect of the left lateral malleolus. There is mild edema but no ecchymosis. Pain 0/10 while seated. "Severe" pain with weight bearing. Has elevated left leg but has not iced, compressed, or taken any medication for his pain. Does not endorse any pain elsewhere on the LLE. No other symptoms to report.      Outpatient Medications Prior to Visit  Medication Sig Dispense Refill  . atorvastatin (LIPITOR) 40 MG tablet Take 1 tablet (40 mg total) by mouth daily at 6 PM. 90 tablet 3  . levETIRAcetam (KEPPRA) 500 MG tablet Take 1 tablet (500 mg total) by mouth 2 (two) times daily. 180 tablet 3  . rivaroxaban (XARELTO) 20 MG TABS tablet Take 1 tablet (20 mg total) by mouth daily with supper. START ONLY AFTER YOU HAVE COMPLETED THE STARTER PACK 90 tablet 3  . aspirin EC 81 MG tablet Take 81 mg by mouth daily.    Marland Kitchen. docusate sodium (COLACE) 50 MG capsule Take 1 capsule (50 mg total) by mouth 2 (two) times daily. (Patient not taking: Reported on 09/05/2017) 10 capsule 0  . polyethylene glycol (MIRALAX / GLYCOLAX) packet Take 17 g by mouth 2 (two) times daily. (Patient not taking: Reported on 09/05/2017) 30 each 0   No facility-administered medications prior to visit.      ROS Review of Systems  Constitutional: Negative for chills, fever and malaise/fatigue.  Eyes: Negative for blurred vision.  Respiratory: Negative for  shortness of breath.   Cardiovascular: Negative for chest pain and palpitations.  Gastrointestinal: Negative for abdominal pain and nausea.  Genitourinary: Negative for dysuria and hematuria.  Musculoskeletal: Negative for joint pain and myalgias.  Skin: Negative for rash.  Neurological: Negative for tingling and headaches.  Psychiatric/Behavioral: Negative for depression. The patient is not nervous/anxious.     Objective:  There were no vitals taken for this visit.  BP/Weight 08/10/2017 07/15/2017 04/22/2017  Systolic BP 138 124 -  Diastolic BP 73 78 -  Wt. (Lbs) 227 234.57 221  BMI 28.37 29.32 27.62      Physical Exam  Constitutional: He is oriented to person, place, and time.  Well developed, well nourished, NAD, polite, sitting in wheelchair  HENT:  Head: Normocephalic and atraumatic.  Eyes: No scleral icterus.  Cardiovascular:  1+ pitting edema of the LLE  Pulmonary/Chest: Effort normal.  Musculoskeletal:  Left ankle with tenderness to palpation along the lateral malleolus and 6 cm proximal to the malleolus on the fibula.    Neurological: He is alert and oriented to person, place, and time.  Skin: Skin is warm and dry. No rash noted. No erythema. No pallor.  Psychiatric: He has a normal mood and affect. His behavior is normal. Thought content normal.  Vitals reviewed.    Assessment & Plan:   1. Injury of left ankle, initial encounter - DG Ankle Complete Left; Future - Begin  acetaminophen (TYLENOL) 500 MG tablet; Take 2 tablets (1,000 mg total) by mouth every 8 (eight) hours as needed for up to 7 days.  Dispense: 21 tablet; Refill: 0  2. Constipation, unspecified constipation type - Pt awaiting disability check to buy OTC colace and Miralax.     Meds ordered this encounter  Medications  . acetaminophen (TYLENOL) 500 MG tablet    Sig: Take 2 tablets (1,000 mg total) by mouth every 8 (eight) hours as needed for up to 7 days.    Dispense:  21 tablet    Refill:   0    Order Specific Question:   Supervising Provider    Answer:   [name] [id number]    Follow-up: Return if symptoms worsen or fail to improve.   Loletta Specter PA

## 2017-09-05 NOTE — Patient Instructions (Signed)
Ankle Pain Many things can cause ankle pain, including an injury to the area and overuse of the ankle.The ankle joint holds your body weight and allows you to move around. Ankle pain can occur on either side or the back of one ankle or both ankles. Ankle pain may be sharp and burning or dull and aching. There may be tenderness, stiffness, redness, or warmth around the ankle. Follow these instructions at home: Activity  Rest your ankle as told by your health care provider. Avoid any activities that cause ankle pain.  Do exercises as told by your health care provider.  Ask your health care provider if you can drive. Using a brace, a bandage, or crutches  If you were given a brace: ? Wear it as told by your health care provider. ? Remove it when you take a bath or a shower. ? Try not to move your ankle very much, but wiggle your toes from time to time. This helps to prevent swelling.  If you were given an elastic bandage: ? Remove it when you take a bath or a shower. ? Try not to move your ankle very much, but wiggle your toes from time to time. This helps to prevent swelling. ? Adjust the bandage to make it more comfortable if it feels too tight. ? Loosen the bandage if you have numbness or tingling in your foot or if your foot turns cold and blue.  If you have crutches, use them as told by your health care provider. Continue to use them until you can walk without feeling pain in your ankle. Managing pain, stiffness, and swelling  Raise (elevate) your ankle above the level of your heart while you are sitting or lying down.  If directed, apply ice to the area: ? Put ice in a plastic bag. ? Place a towel between your skin and the bag. ? Leave the ice on for 20 minutes, 2-3 times per day. General instructions  Keep all follow-up visits as told by your health care provider. This is important.  Record this information that may be helpful for you and your health care provider: ? How  often you have ankle pain. ? Where the pain is located. ? What the pain feels like.  Take over-the-counter and prescription medicines only as told by your health care provider. Contact a health care provider if:  Your pain gets worse.  Your pain is not relieved with medicines.  You have a fever or chills.  You are having more trouble with walking.  You have new symptoms. Get help right away if:  Your foot, leg, toes, or ankle tingles or becomes numb.  Your foot, leg, toes, or ankle becomes swollen.  Your foot, leg, toes, or ankle turns pale or blue. This information is not intended to replace advice given to you by your health care provider. Make sure you discuss any questions you have with your health care provider. Document Released: 01/20/2010 Document Revised: 04/02/2016 Document Reviewed: 03/04/2015 Elsevier Interactive Patient Education  2018 Elsevier Inc.  

## 2017-09-05 NOTE — Telephone Encounter (Signed)
Left message informing patient that Xray did not show any fracture and to call the office with any questions or concerns. Maryjean Mornempestt S Roberts, CMA

## 2017-09-05 NOTE — Telephone Encounter (Signed)
-----   Message from Loletta Specteroger David Gomez, PA-C sent at 09/05/2017 10:49 AM EST ----- No fracture. Please notify patient and/or sister that accompanied him to clinic.

## 2017-09-19 ENCOUNTER — Ambulatory Visit: Payer: Medicaid Other | Admitting: Physical Therapy

## 2018-01-11 ENCOUNTER — Ambulatory Visit (INDEPENDENT_AMBULATORY_CARE_PROVIDER_SITE_OTHER): Payer: Medicaid Other | Admitting: Nurse Practitioner

## 2018-01-11 ENCOUNTER — Other Ambulatory Visit: Payer: Self-pay

## 2018-01-11 ENCOUNTER — Encounter (INDEPENDENT_AMBULATORY_CARE_PROVIDER_SITE_OTHER): Payer: Self-pay | Admitting: Nurse Practitioner

## 2018-01-11 VITALS — BP 126/89 | HR 71 | Temp 98.1°F | Ht 75.0 in | Wt 222.4 lb

## 2018-01-11 DIAGNOSIS — H6123 Impacted cerumen, bilateral: Secondary | ICD-10-CM | POA: Diagnosis not present

## 2018-01-11 NOTE — Progress Notes (Signed)
Assessment & Plan:  Ronald Forbes was seen today for cerumen impaction.  Diagnoses and all orders for this visit:  Bilateral hearing loss due to cerumen impaction -     Ambulatory referral to ENT Unsuccessful removal of cerumen impaction of left ear with continued hearing impairment.  Patient has been counseled on age-appropriate routine health concerns for screening and prevention. These are reviewed and up-to-date. Referrals have been placed accordingly. Immunizations are up-to-date or declined.    Subjective:   Chief Complaint  Patient presents with  . Cerumen Impaction   HPI Ronald Forbes 57 y.o. male presents to office today for bilateral cerumen impaction removal.  Cerumen Impaction Patient presents with diminished hearing for the past several weeks.  There is a prior history of cerumen impaction.  The patient was not reularly using ear drops to loosen wax immediately prior to this visit.   Review of Systems  Constitutional: Negative.  Negative for chills, fever, malaise/fatigue and weight loss.  HENT: Positive for hearing loss. Negative for congestion, ear discharge, ear pain, nosebleeds, sinus pain, sore throat and tinnitus.   Eyes: Negative.  Negative for blurred vision, double vision and photophobia.  Respiratory: Negative.  Negative for cough, shortness of breath and stridor.   Cardiovascular: Negative.  Negative for chest pain, palpitations and leg swelling.  Skin: Negative.  Negative for rash.  Neurological: Negative for dizziness, seizures and headaches.  Psychiatric/Behavioral: Negative.  Negative for suicidal ideas.    Past Medical History:  Diagnosis Date  . Alcohol use disorder   . Cerebral vascular accident (HCC)   . 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 Diabetes mellitus type 2 in obese (HCC)   . HLD (hyperlipidemia)   . Morbid obesity (HCC)   . Seizures (HCC)   . Stroke (HCC)   . Tobacco abuse      Past Surgical History:  Procedure Laterality Date  . ABDOMINAL AORTOGRAM W/LOWER EXTREMITY Right 10/20/2016   Procedure: Abdominal Aortogram w/Lower Extremity;  Surgeon: Maeola Harman, MD;  Location: [facility] INVASIVE CV LAB;  Service: Cardiovascular;  Laterality: Right;  lower leg  . AMPUTATION Right 11/15/2016   Procedure: AMPUTATION ABOVE KNEE;  Surgeon: Sherren Kerns, MD;  Location: [facility] OR;  Service: Vascular;  Laterality: Right;  . EP IMPLANTABLE DEVICE N/A 02/09/2016   Procedure: Loop Recorder Insertion;  Surgeon: Duke Salvia, MD;  Location: [facility] INVASIVE CV LAB;  Service: Cardiovascular;  Laterality: N/A;  . FEMORAL-TIBIAL BYPASS GRAFT Right 10/25/2016   Procedure: BYPASS GRAFT FEMORAL-TIBIAL ARTERY USING NON REVERSED SAPPHENOUS VEIN;  Surgeon: Sherren Kerns, MD;  Location: [facility] OR;  Service: Vascular;  Laterality: Right;  . INTRAOPERATIVE ARTERIOGRAM Right 10/25/2016   Procedure: INTRA OPERATIVE ARTERIOGRAM;  Surgeon: Sherren Kerns, MD;  Location: [facility] OR;  Service: Vascular;  Laterality: Right;  . TEE WITHOUT CARDIOVERSION N/A 02/06/2016   Procedure: TRANSESOPHAGEAL ECHOCARDIOGRAM (TEE);  Surgeon: Wendall Stade, MD;  Location: [facility] ENDOSCOPY;  Service: Cardiovascular;  Laterality: N/A;    Family History  Problem Relation Age of Onset  . Hypertension Mother     Social History Reviewed with no changes to be made today.   Outpatient Medications Prior to Visit  Medication Sig Dispense Refill  . atorvastatin (LIPITOR) 40 MG tablet Take 1 tablet (40 mg total) by mouth daily at 6 PM. 90 tablet 3  . levETIRAcetam (KEPPRA) 500 MG tablet Take 1 tablet (500 mg total) by mouth 2 (two) times  daily. 180 tablet 3  . rivaroxaban (XARELTO) 20 MG TABS tablet Take 1 tablet (20 mg total) by mouth daily with supper. START ONLY AFTER YOU HAVE COMPLETED THE STARTER PACK 90 tablet 3  . aspirin EC 81 MG tablet Take 81 mg by mouth daily.    Marland Kitchen docusate sodium (COLACE) 50 MG capsule Take 1 capsule  (50 mg total) by mouth 2 (two) times daily. (Patient not taking: Reported on 09/05/2017) 10 capsule 0  . polyethylene glycol (MIRALAX / GLYCOLAX) packet Take 17 g by mouth 2 (two) times daily. (Patient not taking: Reported on 09/05/2017) 30 each 0   No facility-administered medications prior to visit.     No Known Allergies     Objective:    BP 126/89 (BP Location: Right Arm, Patient Position: Sitting, Cuff Size: Large)   Pulse 71   Temp 98.1 F (36.7 C) (Oral)   Ht  (1.905 m)   Wt 222 lb 6.4 oz (100.9 kg)   SpO2 97%   BMI 27.80 kg/m  Wt Readings from Last 3 Encounters:  01/11/18 222 lb 6.4 oz (100.9 kg)  08/10/17 227 lb (103 kg)  07/15/17 234 lb 9.1 oz (106.4 kg)    Physical Exam  Constitutional: He is oriented to person, place, and time. He appears well-developed and well-nourished. He is cooperative.  HENT:  Head: Normocephalic and atraumatic.  Right Ear: Decreased hearing is noted.  Left Ear: Decreased hearing is noted.  Successful removal of cerumen impaction of right ear with immediate improvement of hearing. Unsuccessful removal of cerumen impaction of left ear with continued hearing impairment.  Eyes: EOM are normal.  Neck: Normal range of motion.  Cardiovascular: Normal rate, regular rhythm, normal heart sounds and intact distal pulses. Exam reveals no gallop and no friction rub.  No murmur heard. Pulmonary/Chest: Effort normal and breath sounds normal. No tachypnea. No respiratory distress. He has no decreased breath sounds. He has no wheezes. He has no rhonchi. He has no rales. He exhibits no tenderness.  Abdominal: Bowel sounds are normal.  Musculoskeletal: He exhibits deformity (R AKA). He exhibits no edema.  Neurological: He is alert and oriented to person, place, and time. Coordination (Using wheelchair) abnormal.  Skin: Skin is warm and dry.  Psychiatric: He has a normal mood and affect. His behavior is normal. Judgment and thought content normal.   Nursing note and vitals reviewed.        Patient has been counseled extensively about nutrition and exercise as well as the importance of adherence with medications and regular follow-up. The patient was given clear instructions to go to ER or return to medical center if symptoms don't improve, worsen or new problems develop. The patient verbalized understanding.   Follow-up: Return if symptoms worsen or fail to improve.   Claiborne Rigg, FNP-BC [facility] [place], [place] [phone]   01/11/2018, 1:06 PM

## 2018-01-11 NOTE — Patient Instructions (Signed)
Earwax Buildup, Adult The ears produce a substance called earwax that helps keep bacteria out of the ear and protects the skin in the ear canal. Occasionally, earwax can build up in the ear and cause discomfort or hearing loss. What increases the risk? This condition is more likely to develop in people who:  Are male.  Are elderly.  Naturally produce more earwax.  Clean their ears often with cotton swabs.  Use earplugs often.  Use in-ear headphones often.  Wear hearing aids.  Have narrow ear canals.  Have earwax that is overly thick or sticky.  Have eczema.  Are dehydrated.  Have excess hair in the ear canal.  What are the signs or symptoms? Symptoms of this condition include:  Reduced or muffled hearing.  A feeling of fullness in the ear or feeling that the ear is plugged.  Fluid coming from the ear.  Ear pain.  Ear itch.  Ringing in the ear.  Coughing.  An obvious piece of earwax that can be seen inside the ear canal.  How is this diagnosed? This condition may be diagnosed based on:  Your symptoms.  Your medical history.  An ear exam. During the exam, your health care provider will look into your ear with an instrument called an otoscope.  You may have tests, including a hearing test. How is this treated? This condition may be treated by:  Using ear drops to soften the earwax.  Having the earwax removed by a health care provider. The health care provider may: ? Flush the ear with water. ? Use an instrument that has a loop on the end (curette). ? Use a suction device.  Surgery to remove the wax buildup. This may be done in severe cases.  Follow these instructions at home:  Take over-the-counter and prescription medicines only as told by your health care provider.  Do not put any objects, including cotton swabs, into your ear. You can clean the opening of your ear canal with a washcloth or facial tissue.  Follow instructions from your health  care provider about cleaning your ears. Do not over-clean your ears.  Drink enough fluid to keep your urine clear or pale yellow. This will help to thin the earwax.  Keep all follow-up visits as told by your health care provider. If earwax builds up in your ears often or if you use hearing aids, consider seeing your health care provider for routine, preventive ear cleanings. Ask your health care provider how often you should schedule your cleanings.  If you have hearing aids, clean them according to instructions from the manufacturer and your health care provider. Contact a health care provider if:  You have ear pain.  You develop a fever.  You have blood, pus, or other fluid coming from your ear.  You have hearing loss.  You have ringing in your ears that does not go away.  Your symptoms do not improve with treatment.  You feel like the room is spinning (vertigo). Summary  Earwax can build up in the ear and cause discomfort or hearing loss.  The most common symptoms of this condition include reduced or muffled hearing and a feeling of fullness in the ear or feeling that the ear is plugged.  This condition may be diagnosed based on your symptoms, your medical history, and an ear exam.  This condition may be treated by using ear drops to soften the earwax or by having the earwax removed by a health care provider.  Do   not put any objects, including cotton swabs, into your ear. You can clean the opening of your ear canal with a washcloth or facial tissue. This information is not intended to replace advice given to you by your health care provider. Make sure you discuss any questions you have with your health care provider. Document Released: 09/09/2004 Document Revised: 10/13/2016 Document Reviewed: 10/13/2016 Elsevier Interactive Patient Education  2018 Elsevier Inc.  

## 2018-01-31 ENCOUNTER — Telehealth (INDEPENDENT_AMBULATORY_CARE_PROVIDER_SITE_OTHER): Payer: Self-pay | Admitting: Physician Assistant

## 2018-01-31 NOTE — Telephone Encounter (Signed)
He was there ear problem,ear is still clogged

## 2018-02-08 ENCOUNTER — Ambulatory Visit (INDEPENDENT_AMBULATORY_CARE_PROVIDER_SITE_OTHER): Payer: Medicaid Other | Admitting: Physician Assistant

## 2018-02-15 DIAGNOSIS — H6123 Impacted cerumen, bilateral: Secondary | ICD-10-CM | POA: Insufficient documentation

## 2018-02-15 DIAGNOSIS — H903 Sensorineural hearing loss, bilateral: Secondary | ICD-10-CM | POA: Insufficient documentation

## 2018-04-18 ENCOUNTER — Telehealth (INDEPENDENT_AMBULATORY_CARE_PROVIDER_SITE_OTHER): Payer: Self-pay | Admitting: Physician Assistant

## 2018-04-18 ENCOUNTER — Other Ambulatory Visit (INDEPENDENT_AMBULATORY_CARE_PROVIDER_SITE_OTHER): Payer: Self-pay | Admitting: Physician Assistant

## 2018-04-18 DIAGNOSIS — R569 Unspecified convulsions: Secondary | ICD-10-CM

## 2018-04-18 DIAGNOSIS — S78119A Complete traumatic amputation at level between unspecified hip and knee, initial encounter: Secondary | ICD-10-CM

## 2018-04-18 MED ORDER — RIVAROXABAN 20 MG PO TABS: 20.0000 mg | ORAL_TABLET | Freq: Every day | ORAL | 3 refills | Status: DC

## 2018-04-18 MED ORDER — LEVETIRACETAM 500 MG PO TABS: 500.0000 mg | ORAL_TABLET | Freq: Two times a day (BID) | ORAL | 1 refills | Status: DC

## 2018-04-18 NOTE — Telephone Encounter (Signed)
Refills sent.  Please notify patient

## 2018-04-18 NOTE — Telephone Encounter (Signed)
Patient wife called to request medication refill for   levETIRAcetam (KEPPRA) 500 MG tablet   rivaroxaban (XARELTO) 20 MG TABS tablet    Patient uses pharmacy   [id number] - [place], [place] - [address] AT [facility] OF [address]  Please Follow up [phone]  Thank you  Louisa Second

## 2018-04-19 ENCOUNTER — Ambulatory Visit (INDEPENDENT_AMBULATORY_CARE_PROVIDER_SITE_OTHER): Payer: Medicaid Other | Admitting: Physician Assistant

## 2018-04-19 ENCOUNTER — Encounter (INDEPENDENT_AMBULATORY_CARE_PROVIDER_SITE_OTHER): Payer: Self-pay | Admitting: Physician Assistant

## 2018-04-19 ENCOUNTER — Other Ambulatory Visit: Payer: Self-pay

## 2018-04-19 VITALS — BP 124/74 | HR 64 | Temp 99.0°F | Ht 75.0 in | Wt 226.4 lb

## 2018-04-19 DIAGNOSIS — M79609 Pain in unspecified limb: Secondary | ICD-10-CM | POA: Diagnosis not present

## 2018-04-19 DIAGNOSIS — T8789 Other complications of amputation stump: Secondary | ICD-10-CM

## 2018-04-19 DIAGNOSIS — Z89611 Acquired absence of right leg above knee: Secondary | ICD-10-CM

## 2018-04-19 DIAGNOSIS — E119 Type 2 diabetes mellitus without complications: Secondary | ICD-10-CM

## 2018-04-19 DIAGNOSIS — E785 Hyperlipidemia, unspecified: Secondary | ICD-10-CM | POA: Diagnosis not present

## 2018-04-19 DIAGNOSIS — Z1211 Encounter for screening for malignant neoplasm of colon: Secondary | ICD-10-CM

## 2018-04-19 DIAGNOSIS — S78111A Complete traumatic amputation at level between right hip and knee, initial encounter: Secondary | ICD-10-CM

## 2018-04-19 LAB — POCT GLYCOSYLATED HEMOGLOBIN (HGB A1C): HEMOGLOBIN A1C: 5.4 % (ref 4.0–5.6)

## 2018-04-19 MED ORDER — GABAPENTIN 300 MG PO CAPS: 300.0000 mg | ORAL_CAPSULE | Freq: Every day | ORAL | 5 refills | Status: DC

## 2018-04-19 NOTE — Patient Instructions (Signed)
Preventing High Cholesterol Cholesterol is a waxy, fat-like substance that your body needs in small amounts. Your liver makes all the cholesterol that your body needs. Having high cholesterol (hypercholesterolemia) increases your risk for heart disease and stroke. Extra (excess) cholesterol comes from the food you eat, such as animal-based fat (saturated fat) from meat and some dairy products. High cholesterol can often be prevented with diet and lifestyle changes. If you already have high cholesterol, you can control it with diet and lifestyle changes, as well as medicine. What nutrition changes can be made?  Eat less saturated fat. Foods that contain saturated fat include red meat and some dairy products.  Avoid processed meats, like bacon and lunch meats.  Avoid trans fats, which are found in margarine and some baked goods.  Avoid foods and beverages that have added sugars.  Eat more fruits, vegetables, and whole grains.  Choose healthy sources of protein, such as fish, poultry, and nuts.  Choose healthy sources of fat, such as: ? Nuts. ? Vegetable oils, especially olive oil. ? Fish that have healthy fats (omega-3 fatty acids), such as mackerel or salmon. What lifestyle changes can be made?  Lose weight if you are overweight. Losing 5-10 lb (2.3-4.5 kg) can help prevent or control high cholesterol and reduce your risk for diabetes and high blood pressure. Ask your health care provider to help you with a diet and exercise plan to safely lose weight.  Get enough exercise. Do at least 150 minutes of moderate-intensity exercise each week. ? You could do this in short exercise sessions several times a day, or you could do longer exercise sessions a few times a week. For example, you could take a brisk 10-minute walk or bike ride, 3 times a day, for 5 days a week.  Do not smoke. If you need help quitting, ask your health care provider.  Limit your alcohol intake. If you drink alcohol,  limit alcohol intake to no more than 1 drink a day for nonpregnant women and 2 drinks a day for men. One drink equals 12 oz of beer, 5 oz of wine, or 1 oz of hard liquor. Why are these changes important? If you have high cholesterol, deposits (plaques) may build up on the walls of your blood vessels. Plaques make the arteries narrower and stiffer, which can restrict or block blood flow and cause blood clots to form. This greatly increases your risk for heart attack and stroke. Making diet and lifestyle changes can reduce your risk for these life-threatening conditions. What can I do to lower my risk?  Manage your risk factors for high cholesterol. Talk with your health care provider about all of your risk factors and how to lower your risk.  Manage other conditions that you have, such as diabetes or high blood pressure (hypertension).  Have your cholesterol checked at regular intervals.  Keep all follow-up visits as told by your health care provider. This is important. How is this treated? In addition to diet and lifestyle changes, your health care provider may recommend medicines to help lower cholesterol, such as a medicine to reduce the amount of cholesterol made in your liver. You may need medicine if:  Diet and lifestyle changes do not lower your cholesterol enough.  You have high cholesterol and other risk factors for heart disease or stroke.  Take over-the-counter and prescription medicines only as told by your health care provider. Where to find more information:  American Heart Association: www.heart.org/HEARTORG/Conditions/Cholesterol/Cholesterol_UCM_001089_SubHomePage.jsp  National Heart, Lung, and   Blood Institute: www.nhlbi.nih.gov/health/resources/heart/heart-cholesterol-hbc-what-html Summary  High cholesterol increases your risk for heart disease and stroke. By keeping your cholesterol level low, you can reduce your risk for these conditions.  Diet and lifestyle changes  are the most important steps in preventing high cholesterol.  Work with your health care provider to manage your risk factors, and have your blood tested regularly. This information is not intended to replace advice given to you by your health care provider. Make sure you discuss any questions you have with your health care provider. Document Released: 08/17/2015 Document Revised: 04/10/2016 Document Reviewed: 04/10/2016 Elsevier Interactive Patient Education  2018 Elsevier Inc.  

## 2018-04-19 NOTE — Progress Notes (Signed)
Subjective:  Patient ID: Ronald Forbes, male    DOB: Nov 06, 1960  Age: 57 y.o. MRN: [id number]  CC: leg pain right stump  HPI Ronald Forbes a 57 y.o.malewith a medical history of right AKA in the setting of DM2, PAD,PE,HLD, seizures,CVA, Tobacco absuse, and ETOH abusepresents with right AKA stump pain. Does not know how to describe pain. Pain appears approximately every month and a half but has been noticing an increased frequency since his amputation. Pain waxes and wanes over the course of a day and self resolves. No current pain.     Requests crutches for better ambulation.      Outpatient Medications Prior to Visit  Medication Sig Dispense Refill  . atorvastatin (LIPITOR) 40 MG tablet Take 1 tablet (40 mg total) by mouth daily at 6 PM. 90 tablet 3  . docusate sodium (COLACE) 50 MG capsule Take 1 capsule (50 mg total) by mouth 2 (two) times daily. 10 capsule 0  . levETIRAcetam (KEPPRA) 500 MG tablet Take 1 tablet (500 mg total) by mouth 2 (two) times daily. 180 tablet 1  . rivaroxaban (XARELTO) 20 MG TABS tablet Take 1 tablet (20 mg total) by mouth daily with supper. START ONLY AFTER YOU HAVE COMPLETED THE STARTER PACK 90 tablet 3  . aspirin EC 81 MG tablet Take 81 mg by mouth daily.    . polyethylene glycol (MIRALAX / GLYCOLAX) packet Take 17 g by mouth 2 (two) times daily. 30 each 0   No facility-administered medications prior to visit.      ROS Review of Systems  Constitutional: Negative for chills, fever and malaise/fatigue.  Eyes: Negative for blurred vision.  Respiratory: Negative for shortness of breath.   Cardiovascular: Negative for chest pain and palpitations.  Gastrointestinal: Negative for abdominal pain and nausea.  Genitourinary: Negative for dysuria and hematuria.  Musculoskeletal: Negative for joint pain and myalgias.       Stump pain  Skin: Negative for rash.  Neurological: Negative for tingling and headaches.  Psychiatric/Behavioral: Negative for  depression. The patient is not nervous/anxious.     Objective:  BP 124/74 (BP Location: Left Arm, Patient Position: Sitting, Cuff Size: Large)   Pulse 64   Temp 99 F (37.2 C) (Oral)   Ht 6\' 3"  (1.905 m)   Wt 226 lb 6.4 oz (102.7 kg)   SpO2 96%   BMI 28.30 kg/m   BP/Weight 04/19/2018 01/11/2018 09/05/2017  Systolic BP 124 126 153  Diastolic BP 74 89 84  Wt. (Lbs) 226.4 222.4 -  BMI 28.3 27.8 -      Physical Exam  Constitutional: He is oriented to person, place, and time.  Well developed, obese, NAD, polite, sitting in wheelchair  HENT:  Head: Normocephalic and atraumatic.  Eyes: No scleral icterus.  Neck: Normal range of motion. Neck supple. No thyromegaly present.  Cardiovascular: Normal rate, regular rhythm and normal heart sounds.  Pulmonary/Chest: Effort normal and breath sounds normal.  Musculoskeletal: He exhibits no edema.  Full right hip aROM. Right AKA.  Neurological: He is alert and oriented to person, place, and time.  Skin: Skin is warm and dry. No rash noted. No erythema. No pallor.  Stump without lesions, erythema, edema, ecchymosis, scaling, crusting, suppuration, bleeding, or dehiscence.   Psychiatric: He has a normal mood and affect. His behavior is normal. Thought content normal.  Vitals reviewed.    Assessment & Plan:    1. Amputation stump pain (HCC) - Begin gabapentin (NEURONTIN) 300 MG capsule;  Take 1 capsule (300 mg total) by mouth at bedtime.  Dispense: 30 capsule; Refill: 5  2. Hyperlipidemia, unspecified hyperlipidemia type - Lipid panel; Future  3. Type 2 diabetes mellitus without complication, without long-term current use of insulin (HCC) - Ambulatory referral to Ophthalmology - Comprehensive metabolic panel; Future - HgB A1c 5.4%  4. Special screening for malignant neoplasms, colon - Fecal occult blood, imunochemical   Meds ordered this encounter  Medications  . gabapentin (NEURONTIN) 300 MG capsule    Sig: Take 1 capsule (300  mg total) by mouth at bedtime.    Dispense:  30 capsule    Refill:  5    Order Specific Question:   Supervising Provider    Answer:   Hoy Register [[id number]]    Follow-up: Return in about 6 months (around 10/18/2018) for Diabetes and HLD.   Loletta Specter PA

## 2018-04-19 NOTE — Telephone Encounter (Signed)
Patient is aware.  ° °Ronald Forbes  °

## 2018-04-20 NOTE — Addendum Note (Signed)
Addended by: Bronwen Betters on: 04/20/2018 09:36 AM   Modules accepted: Orders

## 2018-04-22 LAB — FECAL OCCULT BLOOD, IMMUNOCHEMICAL: FECAL OCCULT BLD: NEGATIVE

## 2018-04-24 ENCOUNTER — Telehealth (INDEPENDENT_AMBULATORY_CARE_PROVIDER_SITE_OTHER): Payer: Self-pay

## 2018-04-24 NOTE — Telephone Encounter (Signed)
Spoke with patients sister and notified her that patients FIT was negative. She will inform patient. Maryjean Morn, CMA

## 2018-04-24 NOTE — Telephone Encounter (Signed)
-----   Message from Loletta Specter, PA-C sent at 04/24/2018  1:31 PM EDT ----- Negative FIT.

## 2018-07-20 ENCOUNTER — Other Ambulatory Visit (INDEPENDENT_AMBULATORY_CARE_PROVIDER_SITE_OTHER): Payer: Self-pay | Admitting: Physician Assistant

## 2018-07-20 DIAGNOSIS — E785 Hyperlipidemia, unspecified: Secondary | ICD-10-CM

## 2018-07-20 MED ORDER — ATORVASTATIN CALCIUM 40 MG PO TABS: 40.0000 mg | ORAL_TABLET | Freq: Every day | ORAL | 1 refills | Status: DC

## 2018-07-20 NOTE — Telephone Encounter (Signed)
FWD to PCP. Ronald Forbes, CMA  

## 2018-10-18 ENCOUNTER — Ambulatory Visit (INDEPENDENT_AMBULATORY_CARE_PROVIDER_SITE_OTHER): Payer: Medicaid Other | Admitting: Primary Care

## 2018-10-19 ENCOUNTER — Telehealth (INDEPENDENT_AMBULATORY_CARE_PROVIDER_SITE_OTHER): Payer: Self-pay | Admitting: Primary Care

## 2018-10-19 DIAGNOSIS — R569 Unspecified convulsions: Secondary | ICD-10-CM

## 2018-10-19 MED ORDER — LEVETIRACETAM 500 MG PO TABS: 500.0000 mg | ORAL_TABLET | Freq: Two times a day (BID) | ORAL | 0 refills | Status: DC

## 2018-10-19 NOTE — Addendum Note (Signed)
Addended by: Grayce Sessions on: 10/19/2018 05:40 PM   Modules accepted: Orders

## 2018-10-19 NOTE — Telephone Encounter (Signed)
Faxed received from pharmacy refilled keppra

## 2018-10-26 ENCOUNTER — Emergency Department (HOSPITAL_COMMUNITY): Payer: Medicare Other

## 2018-10-26 ENCOUNTER — Encounter: Payer: Self-pay | Admitting: Neurology

## 2018-10-26 ENCOUNTER — Telehealth (INDEPENDENT_AMBULATORY_CARE_PROVIDER_SITE_OTHER): Payer: Self-pay | Admitting: Physician Assistant

## 2018-10-26 ENCOUNTER — Inpatient Hospital Stay (HOSPITAL_COMMUNITY)
Admission: EM | Admit: 2018-10-26 | Discharge: 2018-10-28 | DRG: 101 | Disposition: A | Discharge status: Home Health | Payer: Medicare Other | Attending: Internal Medicine | Admitting: Internal Medicine

## 2018-10-26 DIAGNOSIS — Z79899 Other long term (current) drug therapy: Secondary | ICD-10-CM

## 2018-10-26 DIAGNOSIS — Z91120 Patient's intentional underdosing of medication regimen due to financial hardship: Secondary | ICD-10-CM

## 2018-10-26 DIAGNOSIS — F17210 Nicotine dependence, cigarettes, uncomplicated: Secondary | ICD-10-CM | POA: Diagnosis present

## 2018-10-26 DIAGNOSIS — E1169 Type 2 diabetes mellitus with other specified complication: Secondary | ICD-10-CM | POA: Diagnosis present

## 2018-10-26 DIAGNOSIS — F1010 Alcohol abuse, uncomplicated: Secondary | ICD-10-CM | POA: Diagnosis present

## 2018-10-26 DIAGNOSIS — I2699 Other pulmonary embolism without acute cor pulmonale: Secondary | ICD-10-CM | POA: Diagnosis present

## 2018-10-26 DIAGNOSIS — E785 Hyperlipidemia, unspecified: Secondary | ICD-10-CM | POA: Diagnosis present

## 2018-10-26 DIAGNOSIS — G40909 Epilepsy, unspecified, not intractable, without status epilepticus: Principal | ICD-10-CM | POA: Diagnosis present

## 2018-10-26 DIAGNOSIS — E669 Obesity, unspecified: Secondary | ICD-10-CM | POA: Diagnosis present

## 2018-10-26 DIAGNOSIS — Z8249 Family history of ischemic heart disease and other diseases of the circulatory system: Secondary | ICD-10-CM

## 2018-10-26 DIAGNOSIS — I69351 Hemiplegia and hemiparesis following cerebral infarction affecting right dominant side: Secondary | ICD-10-CM

## 2018-10-26 DIAGNOSIS — T45516A Underdosing of anticoagulants, initial encounter: Secondary | ICD-10-CM | POA: Diagnosis present

## 2018-10-26 DIAGNOSIS — R569 Unspecified convulsions: Secondary | ICD-10-CM

## 2018-10-26 DIAGNOSIS — E1151 Type 2 diabetes mellitus with diabetic peripheral angiopathy without gangrene: Secondary | ICD-10-CM | POA: Diagnosis present

## 2018-10-26 DIAGNOSIS — I10 Essential (primary) hypertension: Secondary | ICD-10-CM | POA: Diagnosis present

## 2018-10-26 DIAGNOSIS — Z86718 Personal history of other venous thrombosis and embolism: Secondary | ICD-10-CM

## 2018-10-26 DIAGNOSIS — E119 Type 2 diabetes mellitus without complications: Secondary | ICD-10-CM | POA: Diagnosis present

## 2018-10-26 DIAGNOSIS — E1142 Type 2 diabetes mellitus with diabetic polyneuropathy: Secondary | ICD-10-CM | POA: Diagnosis present

## 2018-10-26 DIAGNOSIS — Z86711 Personal history of pulmonary embolism: Secondary | ICD-10-CM

## 2018-10-26 DIAGNOSIS — T426X6A Underdosing of other antiepileptic and sedative-hypnotic drugs, initial encounter: Secondary | ICD-10-CM | POA: Diagnosis present

## 2018-10-26 DIAGNOSIS — Z7901 Long term (current) use of anticoagulants: Secondary | ICD-10-CM

## 2018-10-26 DIAGNOSIS — Z6827 Body mass index (BMI) 27.0-27.9, adult: Secondary | ICD-10-CM

## 2018-10-26 DIAGNOSIS — I82409 Acute embolism and thrombosis of unspecified deep veins of unspecified lower extremity: Secondary | ICD-10-CM | POA: Diagnosis present

## 2018-10-26 HISTORY — DX: Other pulmonary embolism without acute cor pulmonale: I26.99

## 2018-10-26 HISTORY — DX: Peripheral vascular disease, unspecified: I73.9

## 2018-10-26 HISTORY — DX: Acute embolism and thrombosis of unspecified deep veins of unspecified lower extremity: I82.409

## 2018-10-26 LAB — COMPREHENSIVE METABOLIC PANEL
ALT: 39 U/L (ref 0–44)
AST: 35 U/L (ref 15–41)
Albumin: 4.6 g/dL (ref 3.5–5.0)
Alkaline Phosphatase: 103 U/L (ref 38–126)
Anion gap: 26 — ABNORMAL HIGH (ref 5–15)
BUN: 12 mg/dL (ref 6–20)
CALCIUM: 9.5 mg/dL (ref 8.9–10.3)
CO2: 11 mmol/L — AB (ref 22–32)
Chloride: 103 mmol/L (ref 98–111)
Creatinine, Ser: 1.15 mg/dL (ref 0.61–1.24)
GFR calc Af Amer: >60 mL/min (ref >60)
GFR calc non Af Amer: >60 mL/min (ref >60)
Glucose, Bld: 176 mg/dL — ABNORMAL HIGH (ref 70–99)
Potassium: 4.1 mmol/L (ref 3.5–5.1)
Sodium: 140 mmol/L (ref 135–145)
Total Bilirubin: 0.6 mg/dL (ref 0.3–1.2)
Total Protein: 7.9 g/dL (ref 6.5–8.1)

## 2018-10-26 LAB — GLUCOSE, CAPILLARY: Glucose-Capillary: 144 mg/dL — ABNORMAL HIGH (ref 70–99)

## 2018-10-26 LAB — CBC WITH DIFFERENTIAL/PLATELET
ABS IMMATURE GRANULOCYTES: 0.2 10*3/uL — AB (ref 0.00–0.07)
Basophils Absolute: 0 10*3/uL (ref 0.0–0.1)
Basophils Relative: 0 %
Eosinophils Absolute: 0 10*3/uL (ref 0.0–0.5)
Eosinophils Relative: 0 %
HCT: 50 % (ref 39.0–52.0)
Hemoglobin: 16 g/dL (ref 13.0–17.0)
IMMATURE GRANULOCYTES: 1 %
Lymphocytes Relative: 13 %
Lymphs Abs: 2.6 10*3/uL (ref 0.7–4.0)
MCH: 30.9 pg (ref 26.0–34.0)
MCHC: 32 g/dL (ref 30.0–36.0)
MCV: 96.5 fL (ref 80.0–100.0)
Monocytes Absolute: 1 10*3/uL (ref 0.1–1.0)
Monocytes Relative: 5 %
NEUTROS PCT: 81 %
Neutro Abs: 15.9 10*3/uL — ABNORMAL HIGH (ref 1.7–7.7)
PLATELETS: 168 10*3/uL (ref 150–400)
RBC: 5.18 MIL/uL (ref 4.22–5.81)
RDW: 13 % (ref 11.5–15.5)
WBC: 19.7 10*3/uL — ABNORMAL HIGH (ref 4.0–10.5)
nRBC: 0 % (ref 0.0–0.2)

## 2018-10-26 MED ORDER — LEVETIRACETAM IN NACL 1500 MG/100ML IV SOLN
1500.0000 mg | Freq: Once | INTRAVENOUS | Status: AC
  Administered 2018-10-26: 1500 mg via INTRAVENOUS
  Filled 2018-10-26: qty 100

## 2018-10-26 MED ORDER — SODIUM CHLORIDE 0.9 % IV SOLN
INTRAVENOUS | Status: DC
  Administered 2018-10-26 – 2018-10-28 (×4): via INTRAVENOUS

## 2018-10-26 MED ORDER — ONDANSETRON HCL 4 MG PO TABS: 4.0000 mg | ORAL_TABLET | Freq: Four times a day (QID) | ORAL | Status: DC | PRN

## 2018-10-26 MED ORDER — ONDANSETRON HCL 4 MG/2ML IJ SOLN: 4.0000 mg | Freq: Four times a day (QID) | INTRAMUSCULAR | Status: DC | PRN

## 2018-10-26 MED ORDER — LORAZEPAM 2 MG/ML IJ SOLN
INTRAMUSCULAR | Status: AC
  Administered 2018-10-26: 2 mg
  Filled 2018-10-26: qty 1

## 2018-10-26 MED ORDER — ACETAMINOPHEN 650 MG RE SUPP: 650.0000 mg | Freq: Four times a day (QID) | RECTAL | Status: DC | PRN

## 2018-10-26 MED ORDER — MAGNESIUM SULFATE 2 GM/50ML IV SOLN
2.0000 g | Freq: Once | INTRAVENOUS | Status: DC
  Administered 2018-10-26: 2 g via INTRAVENOUS
  Filled 2018-10-26: qty 50

## 2018-10-26 MED ORDER — INSULIN ASPART 100 UNIT/ML ~~LOC~~ SOLN: 0.0000 [IU] | Freq: Three times a day (TID) | SUBCUTANEOUS | Status: DC

## 2018-10-26 MED ORDER — ACETAMINOPHEN 325 MG PO TABS: 650.0000 mg | ORAL_TABLET | Freq: Four times a day (QID) | ORAL | Status: DC | PRN

## 2018-10-26 MED ORDER — LORAZEPAM 2 MG/ML IJ SOLN: 1.0000 mg | INTRAMUSCULAR | Status: DC | PRN

## 2018-10-26 MED ORDER — SODIUM CHLORIDE 0.9 % IV BOLUS
1000.0000 mL | Freq: Once | INTRAVENOUS | Status: AC
  Administered 2018-10-26: 1000 mL via INTRAVENOUS

## 2018-10-26 MED ORDER — MAGNESIUM SULFATE 2 GM/50ML IV SOLN
2.0000 g | Freq: Once | INTRAVENOUS | Status: AC
  Administered 2018-10-26: 2 g via INTRAVENOUS
  Filled 2018-10-26: qty 50

## 2018-10-26 MED ORDER — ENOXAPARIN SODIUM 40 MG/0.4ML ~~LOC~~ SOLN
40.0000 mg | Freq: Every day | SUBCUTANEOUS | Status: DC
  Administered 2018-10-26: 40 mg via SUBCUTANEOUS
  Filled 2018-10-26 (×2): qty 0.4

## 2018-10-26 NOTE — ED Provider Notes (Signed)
[facility]-EMERGENCY DEPT Provider Note   CSN: [id number] Arrival date & time: 10/26/18  1256    History   Chief Complaint Chief Complaint  Patient presents with  . Seizures    HPI Raywood Axon is a 58 y.o. male.     Level 5 caveat due to acuity of condition.  HPI   Kobi Ply is a 58 y.o. male, with a history of CVA, DM, hyperlipidemia, and seizures, presenting to the ED with seizures today.  EMS reports patient is cared for in the home of his sister.  Sister reported patient had 3 seizures today. Has not taken his medication for the past week, due to cost concerns. No recent illness, trauma, fever, N/V/D.   Past Medical History:  Diagnosis Date  . Alcohol use disorder   . Cerebral vascular accident (HCC)   . 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 Diabetes mellitus type 2 in obese (HCC)   . HLD (hyperlipidemia)   . Morbid obesity (HCC)   . Seizures (HCC)   . Stroke (HCC)   . Tobacco abuse     Patient Active Problem List   Diagnosis Date Noted  . Pressure injury of skin 07/12/2017  . Acute pulmonary embolism (HCC) 07/11/2017  . Alcohol abuse 07/11/2017  . Pulmonary embolism (HCC) 07/11/2017  . Seizure (HCC) 01/25/2017  . Depression 01/25/2017  . DVT (deep venous thrombosis) (HCC) 01/25/2017  . Seizures (HCC) 01/25/2017  . Hemiparesis affecting right side as late effect of cerebrovascular accident (CVA) (HCC) 12/07/2016  . Type 2 diabetes mellitus with diabetic peripheral angiopathy without gangrene, without long-term current use of insulin (HCC)   . 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 History of CVA with residual deficit   . Phantom limb pain (HCC)   . 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 Unilateral AKA, right (HCC)   . Diabetic peripheral neuropathy (HCC)   . Diabetes mellitus type 2 in nonobese (HCC)   .  Reactive hypertension   . Type 2 diabetes mellitus with peripheral neuropathy (HCC)   . Benign essential HTN   . Hypokalemia   . Hypoalbuminemia due to protein-calorie malnutrition (HCC)   . Acute blood loss anemia   . Debilitated 10/28/2016  . Ischemia of right lower extremity 10/25/2016  . PAD (peripheral artery disease) (HCC) 10/20/2016  . Thyroid nodule 02/29/2016  . 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 Cerebrovascular accident (CVA) due to embolism of left posterior cerebral artery (HCC)   . Diabetes mellitus type 2 in obese (HCC) 02/07/2016  . HLD (hyperlipidemia)   . Tobacco abuse 02/04/2016  . Alcohol use disorder 02/04/2016  . Morbid obesity (HCC) 02/04/2016  . CVA (cerebral vascular accident) (HCC) 02/04/2016    Past Surgical History:  Procedure Laterality Date  . ABDOMINAL AORTOGRAM W/LOWER EXTREMITY Right 10/20/2016   Procedure: Abdominal Aortogram w/Lower Extremity;  Surgeon: Maeola Harman, MD;  Location: [facility] INVASIVE CV LAB;  Service: Cardiovascular;  Laterality: Right;  lower leg  . AMPUTATION Right 11/15/2016   Procedure: AMPUTATION ABOVE KNEE;  Surgeon: Sherren Kerns, MD;  Location: [facility] OR;  Service: Vascular;  Laterality: Right;  . EP IMPLANTABLE DEVICE N/A 02/09/2016   Procedure: Loop Recorder Insertion;  Surgeon: Duke Salvia, MD;  Location: [facility] INVASIVE CV LAB;  Service: Cardiovascular;  Laterality: N/A;  . FEMORAL-TIBIAL BYPASS GRAFT Right 10/25/2016   Procedure:  BYPASS GRAFT FEMORAL-TIBIAL ARTERY USING NON REVERSED SAPPHENOUS VEIN;  Surgeon: Sherren Kerns, MD;  Location: [facility] OR;  Service: Vascular;  Laterality: Right;  . INTRAOPERATIVE ARTERIOGRAM Right 10/25/2016   Procedure: INTRA OPERATIVE ARTERIOGRAM;  Surgeon: Sherren Kerns, MD;  Location: [facility] OR;  Service: Vascular;  Laterality: Right;  . TEE WITHOUT CARDIOVERSION N/A 02/06/2016   Procedure: TRANSESOPHAGEAL ECHOCARDIOGRAM (TEE);  Surgeon: Wendall Stade, MD;  Location: [facility] ENDOSCOPY;  Service:  Cardiovascular;  Laterality: N/A;        Home Medications    Prior to Admission medications   Medication Sig Start Date End Date Taking? Authorizing Provider  aspirin EC 81 MG tablet Take 81 mg by mouth daily.    [provider]  atorvastatin (LIPITOR) 40 MG tablet TAKE 1 TABLET BY MOUTH DAILY AT 6 PM Patient taking differently: Take 40 mg by mouth daily.  07/20/18   Loletta Specter, PA-C  docusate sodium (COLACE) 50 MG capsule Take 1 capsule (50 mg total) by mouth 2 (two) times daily. 08/10/17   Loletta Specter, PA-C  gabapentin (NEURONTIN) 300 MG capsule Take 1 capsule (300 mg total) by mouth at bedtime. 04/19/18   Loletta Specter, PA-C  levETIRAcetam (KEPPRA) 500 MG tablet Take 1 tablet (500 mg total) by mouth 2 (two) times daily. 10/19/18   Grayce Sessions, NP  rivaroxaban (XARELTO) 20 MG TABS tablet Take 1 tablet (20 mg total) by mouth daily with supper. START ONLY AFTER YOU HAVE COMPLETED THE STARTER PACK 04/18/18   Loletta Specter, PA-C    Family History Family History  Problem Relation Age of Onset  . Hypertension Mother     Social History Social History   Tobacco Use  . Smoking status: Current Every Day Smoker    Packs/day: 0.25    Years: 40.00    Pack years: 10.00    Types: Cigarettes  . Smokeless tobacco: Never Used  . Tobacco comment: Less than 1 pk per day  Substance Use Topics  . Alcohol use: No    Alcohol/week: 0.0 standard drinks    Frequency: Never    Comment: 6 pack/day (past)  . Drug use: Yes    Frequency: 1.0 times per week    Types: Marijuana    Comment: occasional marijuana     Allergies   Patient has no known allergies.   Review of Systems Review of Systems  Unable to perform ROS: Mental status change     Physical Exam Updated Vital Signs BP (!) 151/103   Pulse (!) 132   Temp 97.9 F (36.6 C) (Oral)   Resp (!) 22   SpO2 95%   Physical Exam Vitals signs and nursing note reviewed.  Constitutional:       General: He is not in acute distress.    Appearance: He is well-developed. He is not diaphoretic.  HENT:     Head: Normocephalic and atraumatic.     Mouth/Throat:     Mouth: Mucous membranes are moist.     Pharynx: Oropharynx is clear.  Eyes:     Conjunctiva/sclera: Conjunctivae normal.  Neck:     Musculoskeletal: Neck supple.  Cardiovascular:     Rate and Rhythm: Regular rhythm. Tachycardia present.     Pulses: Normal pulses.          Radial pulses are 2+ on the right side and 2+ on the left side.     Heart sounds: Normal heart sounds.     Comments: Tactile  temperature in the extremities appropriate and equal bilaterally. Pulmonary:     Effort: Pulmonary effort is normal. No respiratory distress.     Breath sounds: Normal breath sounds.  Abdominal:     Palpations: Abdomen is soft.     Tenderness: There is no abdominal tenderness. There is no guarding.  Genitourinary:    Comments: Urinary incontinence Musculoskeletal:     Right lower leg: No edema.     Left lower leg: No edema.  Feet:     Right foot:     Amputation: Right leg is amputated below knee.  Lymphadenopathy:     Cervical: No cervical adenopathy.  Skin:    General: Skin is warm and dry.  Psychiatric:        Mood and Affect: Mood and affect normal.        Speech: Speech normal.        Behavior: Behavior normal.      ED Treatments / Results  Labs (all labs ordered are listed, but only abnormal results are displayed) Labs Reviewed  COMPREHENSIVE METABOLIC PANEL - Abnormal; Notable for the following components:      Result Value   CO2 11 (*)    Glucose, Bld 176 (*)    Anion gap 26 (*)    All other components within normal limits  CBC WITH DIFFERENTIAL/PLATELET - Abnormal; Notable for the following components:   WBC 19.7 (*)    Neutro Abs 15.9 (*)    Abs Immature Granulocytes 0.20 (*)    All other components within normal limits    EKG None  Radiology Dg Chest 1 View  Result Date: 10/26/2018  CLINICAL DATA:  Seizure. EXAM: CHEST  1 VIEW COMPARISON:  Radiograph of July 13, 2017 FINDINGS: The heart size and mediastinal contours are within normal limits. Both lungs are clear. The visualized skeletal structures are unremarkable. IMPRESSION: No active disease. Electronically Signed   By: Lupita RaiderJames  Green Jr, M.D.   On: 10/26/2018 14:10    Procedures Procedures (including critical care time)  Medications Ordered in ED Medications  sodium chloride 0.9 % bolus 1,000 mL (has no administration in time range)  LORazepam (ATIVAN) 2 MG/ML injection (2 mg  Given 10/26/18 1311)  levETIRAcetam (KEPPRA) IVPB 1500 mg/ 100 mL premix (0 mg Intravenous Stopped 10/26/18 1401)  sodium chloride 0.9 % bolus 1,000 mL (0 mLs Intravenous Stopped 10/26/18 1437)     Initial Impression / Assessment and Plan / ED Course  I have reviewed the triage vital signs and the nursing notes.  Pertinent labs & imaging results that were available during my care of the patient were reviewed by me and considered in my medical decision making (see chart for details).  Clinical Course as of Oct 26 1611  Thu Oct 26, 2018  1310 Was asked to come into the patient's room shortly after arrival by EMS.  Patient actively seizing.   [SJ]  1405 Patient alert and oriented.  He denies any recent illness, fever, or other complaints, however, he also states he frequently forgets things due to his previous stroke.  He feels as though he is back at his baseline.   [SJ]    Clinical Course User Index [SJ] ,  C, PA-C        Patient presents for evaluation following multiple seizures.  He had a seizure upon arrival here in the ED.  This was treated with Ativan and did not recur during my time with the patient.  Initially tachycardic but improved  with IV fluids.  Findings and plan of care discussed with Tilden Fossa, MD. Dr. Madilyn Hook personally evaluated and examined this patient.  End of shift patient care handoff report given  to Graciella Freer, PA-C. Plan: Head CT pending. Reevaluate. May need to admit patient for at least observation due to series of 4 seizures today.  Vitals:   10/26/18 1326 10/26/18 1330 10/26/18 1430 10/26/18 1500  BP: (!) 151/103 (!) 160/105 (!) 154/93 126/87  Pulse: (!) 132 (!) 137 (!) 102 92  Resp: (!) 22 (!) 21 18 (!) 26  Temp: 97.9 F (36.6 C)     TempSrc: Oral     SpO2: 95% 94% 95% 94%      Final Clinical Impressions(s) / ED Diagnoses   Final diagnoses:  Seizure [facility])    ED Discharge Orders    None       Concepcion Living 10/26/18 1647    Tilden Fossa, MD 10/27/18 2319

## 2018-10-26 NOTE — Telephone Encounter (Signed)
1) Medication(s) Requested (by name): levETIRAcetam (KEPPRA) 500 MG tablet  atorvastatin (LIPITOR) 40 MG tablet  Blood clot med (states they did not know the name)   *patient sister states that the prices for the medications have increased and that she is unable to pay the $700+ for the medications and would like for something to be done so the prices could go back down to $3 however, patient was explained that PCP office does not have control over pharmacy pricing that a substitution MIGHT be able to be provided and that MIGHT help with pricing however, there is no guarantee. Please follow up.  Please follow up 2) Pharmacy of Choice: [id number] - [place], [place] - [address] AT [facility] OF [address]   *patient states the pharmacy couldn't fill it and to contact pcp office

## 2018-10-26 NOTE — ED Notes (Signed)
Bed: WA06 Expected date:  Expected time:  Means of arrival:  Comments: EMS-SZ 

## 2018-10-26 NOTE — ED Notes (Signed)
Condom Catheter placed on patient

## 2018-10-26 NOTE — ED Triage Notes (Signed)
Transported by [facility] from home-- patient has experienced a total of 4 seizures today. Hx of epilepsy and stroke and has been without blood thinner (unknown) and seizure medication (Keppra)  x 1 week. AAO x 4 upon arrival.

## 2018-10-26 NOTE — ED Provider Notes (Signed)
  Care assumed from [place], [place] at shift change with CT head and re-evaluation pending.   In brief, this patient is a 58 y.o. M with PMH/o CVA, DM brought in by EMS for evaluation of seizures. Patient with 3 seizures at home and one witnessed seizure here in the ED. Patient is on Keppra but is not been taking his medications for the last month.  See note from previous provider for full history/physical exam.  PLAN: He is still postictal after evaluation in the emergency department.  He is pending CT head.  Plan for reevaluation.  Head CT negative for any acute abnormality.   Repeat vitals improved.  Patient had initially been on some oxygen and had O2 sats down in the 80s.  This was when he was sleeping and after he got Ativan.  On reevaluation, he is able to be off oxygen without any signs of hypoxia or tachycardia.  Discussed with patient sister, [name].  She does report the patient lives with her.  Patient had 3 witnessed seizures that she states were back-to-back, prompting EMS visit.  She does report that since the stroke, he has some difficulty with words but can otherwise answer questions appropriately.  Family came to ED bedside and states that patient is still not back to baseline.  Given prolonged postictal period, will plan for admission for observation.  Suspect this is most likely due from medication noncompliance.  Discussed patient with Dr. Robb Matar (hospitalist). Will admit.    1. Seizure [facility])       Rosana Hoes 10/26/18 2303    Wynetta Fines, MD 10/26/18 [phone]

## 2018-10-27 ENCOUNTER — Other Ambulatory Visit: Payer: Self-pay

## 2018-10-27 ENCOUNTER — Encounter (HOSPITAL_COMMUNITY): Payer: Self-pay | Admitting: Internal Medicine

## 2018-10-27 DIAGNOSIS — F17210 Nicotine dependence, cigarettes, uncomplicated: Secondary | ICD-10-CM | POA: Diagnosis present

## 2018-10-27 DIAGNOSIS — E1142 Type 2 diabetes mellitus with diabetic polyneuropathy: Secondary | ICD-10-CM | POA: Diagnosis present

## 2018-10-27 DIAGNOSIS — Z8673 Personal history of transient ischemic attack (TIA), and cerebral infarction without residual deficits: Secondary | ICD-10-CM | POA: Diagnosis not present

## 2018-10-27 DIAGNOSIS — Z6827 Body mass index (BMI) 27.0-27.9, adult: Secondary | ICD-10-CM | POA: Diagnosis not present

## 2018-10-27 DIAGNOSIS — E1151 Type 2 diabetes mellitus with diabetic peripheral angiopathy without gangrene: Secondary | ICD-10-CM | POA: Diagnosis present

## 2018-10-27 DIAGNOSIS — Z7901 Long term (current) use of anticoagulants: Secondary | ICD-10-CM | POA: Diagnosis not present

## 2018-10-27 DIAGNOSIS — R569 Unspecified convulsions: Secondary | ICD-10-CM | POA: Diagnosis present

## 2018-10-27 DIAGNOSIS — T426X6A Underdosing of other antiepileptic and sedative-hypnotic drugs, initial encounter: Secondary | ICD-10-CM | POA: Diagnosis present

## 2018-10-27 DIAGNOSIS — E785 Hyperlipidemia, unspecified: Secondary | ICD-10-CM | POA: Diagnosis present

## 2018-10-27 DIAGNOSIS — Z86718 Personal history of other venous thrombosis and embolism: Secondary | ICD-10-CM | POA: Diagnosis not present

## 2018-10-27 DIAGNOSIS — Z8249 Family history of ischemic heart disease and other diseases of the circulatory system: Secondary | ICD-10-CM | POA: Diagnosis not present

## 2018-10-27 DIAGNOSIS — Z91120 Patient's intentional underdosing of medication regimen due to financial hardship: Secondary | ICD-10-CM | POA: Diagnosis not present

## 2018-10-27 DIAGNOSIS — T45516A Underdosing of anticoagulants, initial encounter: Secondary | ICD-10-CM | POA: Diagnosis present

## 2018-10-27 DIAGNOSIS — I10 Essential (primary) hypertension: Secondary | ICD-10-CM | POA: Diagnosis present

## 2018-10-27 DIAGNOSIS — Z86711 Personal history of pulmonary embolism: Secondary | ICD-10-CM | POA: Diagnosis not present

## 2018-10-27 DIAGNOSIS — E669 Obesity, unspecified: Secondary | ICD-10-CM | POA: Diagnosis present

## 2018-10-27 DIAGNOSIS — F1010 Alcohol abuse, uncomplicated: Secondary | ICD-10-CM | POA: Diagnosis present

## 2018-10-27 DIAGNOSIS — Z79899 Other long term (current) drug therapy: Secondary | ICD-10-CM | POA: Diagnosis not present

## 2018-10-27 DIAGNOSIS — G40909 Epilepsy, unspecified, not intractable, without status epilepticus: Secondary | ICD-10-CM | POA: Diagnosis present

## 2018-10-27 LAB — BASIC METABOLIC PANEL
Anion gap: 8 (ref 5–15)
Anion gap: 10 (ref 5–15)
BUN: 11 mg/dL (ref 6–20)
BUN: 10 mg/dL (ref 6–20)
CO2: 20 mmol/L — ABNORMAL LOW (ref 22–32)
CO2: 19 mmol/L — ABNORMAL LOW (ref 22–32)
Calcium: 8.3 mg/dL — ABNORMAL LOW (ref 8.9–10.3)
Calcium: 8.3 mg/dL — ABNORMAL LOW (ref 8.9–10.3)
Chloride: 108 mmol/L (ref 98–111)
Chloride: 109 mmol/L (ref 98–111)
Creatinine, Ser: 0.79 mg/dL (ref 0.61–1.24)
Creatinine, Ser: 0.73 mg/dL (ref 0.61–1.24)
GFR calc Af Amer: >60 mL/min (ref >60)
GFR calc Af Amer: >60 mL/min (ref >60)
GFR calc non Af Amer: >60 mL/min (ref >60)
GFR calc non Af Amer: >60 mL/min (ref >60)
Glucose, Bld: 136 mg/dL — ABNORMAL HIGH (ref 70–99)
Glucose, Bld: 105 mg/dL — ABNORMAL HIGH (ref 70–99)
Potassium: 3.6 mmol/L (ref 3.5–5.1)
Potassium: 3.6 mmol/L (ref 3.5–5.1)
SODIUM: 136 mmol/L (ref 135–145)
Sodium: 138 mmol/L (ref 135–145)

## 2018-10-27 LAB — MAGNESIUM: Magnesium: 1.9 mg/dL (ref 1.7–2.4)

## 2018-10-27 LAB — CBC WITH DIFFERENTIAL/PLATELET
Abs Immature Granulocytes: 0.02 10*3/uL (ref 0.00–0.07)
Basophils Absolute: 0 10*3/uL (ref 0.0–0.1)
Basophils Relative: 0 %
Eosinophils Absolute: 0 10*3/uL (ref 0.0–0.5)
Eosinophils Relative: 0 %
HCT: 39.9 % (ref 39.0–52.0)
Hemoglobin: 12.9 g/dL — ABNORMAL LOW (ref 13.0–17.0)
Immature Granulocytes: 0 %
Lymphocytes Relative: 31 %
Lymphs Abs: 2.5 10*3/uL (ref 0.7–4.0)
MCH: 30.4 pg (ref 26.0–34.0)
MCHC: 32.3 g/dL (ref 30.0–36.0)
MCV: 94.1 fL (ref 80.0–100.0)
Monocytes Absolute: 0.6 10*3/uL (ref 0.1–1.0)
Monocytes Relative: 8 %
Neutro Abs: 5 10*3/uL (ref 1.7–7.7)
Neutrophils Relative %: 61 %
Platelets: 123 10*3/uL — ABNORMAL LOW (ref 150–400)
RBC: 4.24 MIL/uL (ref 4.22–5.81)
RDW: 13.2 % (ref 11.5–15.5)
WBC: 8.1 10*3/uL (ref 4.0–10.5)
nRBC: 0 % (ref 0.0–0.2)

## 2018-10-27 LAB — GLUCOSE, CAPILLARY
GLUCOSE-CAPILLARY: 112 mg/dL — AB (ref 70–99)
Glucose-Capillary: 98 mg/dL (ref 70–99)
Glucose-Capillary: 97 mg/dL (ref 70–99)
Glucose-Capillary: 115 mg/dL — ABNORMAL HIGH (ref 70–99)

## 2018-10-27 LAB — HIV ANTIBODY (ROUTINE TESTING W REFLEX): HIV Screen 4th Generation wRfx: NONREACTIVE

## 2018-10-27 LAB — PHOSPHORUS: Phosphorus: 2.9 mg/dL (ref 2.5–4.6)

## 2018-10-27 MED ORDER — LEVETIRACETAM 500 MG PO TABS
1000.0000 mg | ORAL_TABLET | Freq: Once | ORAL | Status: AC
  Administered 2018-10-27: 1000 mg via ORAL
  Filled 2018-10-27: qty 2

## 2018-10-27 MED ORDER — GABAPENTIN 300 MG PO CAPS
300.0000 mg | ORAL_CAPSULE | Freq: Every day | ORAL | Status: DC
  Filled 2018-10-27: qty 1

## 2018-10-27 MED ORDER — LEVETIRACETAM 500 MG PO TABS
500.0000 mg | ORAL_TABLET | Freq: Two times a day (BID) | ORAL | Status: DC
  Administered 2018-10-27 – 2018-10-28 (×3): 500 mg via ORAL
  Filled 2018-10-27 (×3): qty 1

## 2018-10-27 NOTE — H&P (Signed)
History and Physical    Ronald Forbes [facility] XID:[id number] DOB: 1960/10/25 DOA: 10/26/2018  PCP: Loletta Specter, PA-C   Patient coming from: Home.  I have personally briefly reviewed patient's old medical records in [facility]  Chief Complaint: Seizures.  HPI: Ronald Forbes is a 58 y.o. male with medical history significant of alcohol use disorder, embolic CVA, type 2 diabetes, history of pulmonary embolism and DVT, hyperlipidemia, PAD, seizures disorder, current tobacco use, medication noncompliance (has not taken his medications in over a month) who is brought to the emergency department due to having 3 seizure episodes at home and another one in the ER earlier today.  He denies fever, chills, but complains of a headache.  No sore throat, dyspnea, hemoptysis, chest pain, palpitations, dizziness, diaphoresis, PND, orthopnea or pitting edema left lower extremity.  No abdominal pain, nausea, emesis, diarrhea, constipation, melena or hematochezia.  Denies dysuria, frequency or hematuria.  Denies polyuria, polydipsia, polyphagia or blurred vision.  He was given a 1500 mg dose of Keppra  ED Course:  He initial vital signs temperature 97.9 F, pulse 132, respirations 22, blood pressure 151/103 mmHg and O2 sat 95% on room air.  Was given a 1 L normal saline bolus and 1500 mg of Keppra IVPB.  His white count was 19.7 with 81% neutrophils, 13% lymphocytes and 5% monocytes.  Hemoglobin 16.0 g/dL and platelets 290.  CMP shows a CO2 of 11 mmol/L with an anion gap of 26 and glucose level of 176 mg/dL.  All other values are within normal limits.  Imaging: CT head did not have any acute abnormality, but showed old PCA and posterior border zone territory infarcts.  There was an old left basal ganglia and left thalamus infarct.  There was no active disease on chest radiograph.    Review of Systems: As per HPI otherwise 10 point review of systems negative.   Past Medical History:  Diagnosis Date    Alcohol use disorder    Cerebral vascular accident [facility])    Cerebrovascular accident (CVA) due to thrombosis of left posterior cerebral artery (HCC)    Diabetes mellitus type 2 in obese (HCC)    DVT (deep venous thrombosis) (HCC) 01/25/2017   HLD (hyperlipidemia)    Morbid obesity (HCC)    PAD (peripheral artery disease) (HCC) 10/20/2016   Pulmonary embolism (HCC) 07/11/2017   Seizures (HCC)    Stroke (HCC)    Tobacco abuse     Past Surgical History:  Procedure Laterality Date   ABDOMINAL AORTOGRAM W/LOWER EXTREMITY Right 10/20/2016   Procedure: Abdominal Aortogram w/Lower Extremity;  Surgeon: Maeola Harman, MD;  Location: [facility] INVASIVE CV LAB;  Service: Cardiovascular;  Laterality: Right;  lower leg   AMPUTATION Right 11/15/2016   Procedure: AMPUTATION ABOVE KNEE;  Surgeon: Sherren Kerns, MD;  Location: [facility] OR;  Service: Vascular;  Laterality: Right;   EP IMPLANTABLE DEVICE N/A 02/09/2016   Procedure: Loop Recorder Insertion;  Surgeon: Duke Salvia, MD;  Location: [facility] INVASIVE CV LAB;  Service: Cardiovascular;  Laterality: N/A;   FEMORAL-TIBIAL BYPASS GRAFT Right 10/25/2016   Procedure: BYPASS GRAFT FEMORAL-TIBIAL ARTERY USING NON REVERSED SAPPHENOUS VEIN;  Surgeon: Sherren Kerns, MD;  Location: [facility] OR;  Service: Vascular;  Laterality: Right;   INTRAOPERATIVE ARTERIOGRAM Right 10/25/2016   Procedure: INTRA OPERATIVE ARTERIOGRAM;  Surgeon: Sherren Kerns, MD;  Location: [facility] OR;  Service: Vascular;  Laterality: Right;   TEE WITHOUT CARDIOVERSION N/A 02/06/2016   Procedure: TRANSESOPHAGEAL ECHOCARDIOGRAM (TEE);  Surgeon: Wendall Stade, MD;  Location: [facility] ENDOSCOPY;  Service: Cardiovascular;  Laterality: N/A;     reports that he has been smoking cigarettes. He has a 10.00 pack-year smoking history. He has never used smokeless tobacco. He reports current drug use. Frequency: 1.00 time per week. Drug: Marijuana. He reports that he does not drink alcohol.  No Known  Allergies  Family History  Problem Relation Age of Onset   Hypertension Mother    Prior to Admission medications   Medication Sig Start Date End Date Taking? Authorizing Provider  atorvastatin (LIPITOR) 40 MG tablet TAKE 1 TABLET BY MOUTH DAILY AT 6 PM Patient taking differently: Take 40 mg by mouth daily.  07/20/18  Yes Loletta Specter, PA-C  levETIRAcetam (KEPPRA) 500 MG tablet Take 1 tablet (500 mg total) by mouth 2 (two) times daily. 10/19/18  Yes Grayce Sessions, NP  rivaroxaban (XARELTO) 20 MG TABS tablet Take 1 tablet (20 mg total) by mouth daily with supper. START ONLY AFTER YOU HAVE COMPLETED THE STARTER PACK 04/18/18  Yes Loletta Specter, PA-C  docusate sodium (COLACE) 50 MG capsule Take 1 capsule (50 mg total) by mouth 2 (two) times daily. Patient not taking: Reported on 10/26/2018 08/10/17   Loletta Specter, PA-C  gabapentin (NEURONTIN) 300 MG capsule Take 1 capsule (300 mg total) by mouth at bedtime. 04/19/18   Loletta Specter, PA-C    Physical Exam: Vitals:   10/26/18 2230 10/26/18 2245 10/26/18 2330 10/26/18 2331  BP: 116/83  125/79   Pulse: 78 79 76   Resp: 20 (!) 21 15   Temp:   98.2 F (36.8 C)   TempSrc:   Oral   SpO2: 94% 98% 96%   Weight:    99.3 kg  Height:     (1.905 m)    Constitutional: NAD, calm, comfortable Eyes: PERRL, lids and conjunctivae normal ENMT: Mucous membranes are moist. Posterior pharynx clear of any exudate or lesions. Neck: normal, supple, no masses, no thyromegaly Respiratory: Decreased breath sounds on bases, otherwise clear to auscultation bilaterally, no wheezing, no crackles. Normal respiratory effort. No accessory muscle use.  Cardiovascular: Regular rate and rhythm, no murmurs / rubs / gallops. No extremity edema. 2+ pedal pulses. No carotid bruits.  Abdomen: Soft, no tenderness, no masses palpated. No hepatosplenomegaly. Bowel sounds positive.  Musculoskeletal: no clubbing / cyanosis.  Right AKA.  No contractures.  Normal muscle tone.  Skin: no rashes, lesions, ulcers on limited dermatological examination. Neurologic: CN 2-12 grossly intact. Sensation intact, DTR normal.  Left-sided hemiparesis. Psychiatric: Alert and oriented x 2, partially oriented to time and situation.   Labs on Admission: I have personally reviewed following labs and imaging studies  CBC: Recent Labs  Lab 10/26/18 1338  WBC 19.7*  NEUTROABS 15.9*  HGB 16.0  HCT 50.0  MCV 96.5  PLT 168   Basic Metabolic Panel: Recent Labs  Lab 10/26/18 1338  NA 140  K 4.1  CL 103  CO2 11*  GLUCOSE 176*  BUN 12  CREATININE 1.15  CALCIUM 9.5   GFR: Estimated Creatinine Clearance: 85.7 mL/min (by C-G formula based on SCr of 1.15 mg/dL). Liver Function Tests: Recent Labs  Lab 10/26/18 1338  AST 35  ALT 39  ALKPHOS 103  BILITOT 0.6  PROT 7.9  ALBUMIN 4.6   No results for input(s): LIPASE, AMYLASE in the last 168 hours. No results for input(s): AMMONIA in the last 168 hours. Coagulation Profile: No results for input(s):  INR, PROTIME in the last 168 hours. Cardiac Enzymes: No results for input(s): CKTOTAL, CKMB, CKMBINDEX, TROPONINI in the last 168 hours. BNP (last 3 results) No results for input(s): PROBNP in the last 8760 hours. HbA1C: No results for input(s): HGBA1C in the last 72 hours. CBG: Recent Labs  Lab 10/26/18 2325  GLUCAP 144*   Lipid Profile: No results for input(s): CHOL, HDL, LDLCALC, TRIG, CHOLHDL, LDLDIRECT in the last 72 hours. Thyroid Function Tests: No results for input(s): TSH, T4TOTAL, FREET4, T3FREE, THYROIDAB in the last 72 hours. Anemia Panel: No results for input(s): VITAMINB12, FOLATE, FERRITIN, TIBC, IRON, RETICCTPCT in the last 72 hours. Urine analysis:    Component Value Date/Time   COLORURINE YELLOW 07/13/2017 0017   APPEARANCEUR CLEAR 07/13/2017 0017   LABSPEC 1.024 07/13/2017 0017   PHURINE 5.0 07/13/2017 0017   GLUCOSEU NEGATIVE 07/13/2017 0017   HGBUR SMALL (A) 07/13/2017  0017   BILIRUBINUR NEGATIVE 07/13/2017 0017   KETONESUR NEGATIVE 07/13/2017 0017   PROTEINUR NEGATIVE 07/13/2017 0017   NITRITE NEGATIVE 07/13/2017 0017   LEUKOCYTESUR NEGATIVE 07/13/2017 0017    Radiological Exams on Admission: Dg Chest 1 View  Result Date: 10/26/2018 CLINICAL DATA:  Seizure. EXAM: CHEST  1 VIEW COMPARISON:  Radiograph of July 13, 2017 FINDINGS: The heart size and mediastinal contours are within normal limits. Both lungs are clear. The visualized skeletal structures are unremarkable. IMPRESSION: No active disease. Electronically Signed   By: Lupita Raider, M.D.   On: 10/26/2018 14:10   Ct Head Wo Contrast  Result Date: 10/26/2018 CLINICAL DATA:  Seizures today. History of stroke, hyperlipidemia and diabetes. EXAM: CT HEAD WITHOUT CONTRAST TECHNIQUE: Contiguous axial images were obtained from the base of the skull through the vertex without intravenous contrast. COMPARISON:  MRI of the head February 24, 2017. FINDINGS: BRAIN: No intraparenchymal hemorrhage, mass effect nor midline shift. LEFT temporal occipital and LEFT parietal encephalomalacia with ex vacuo dilatation subjacent third and LEFT lateral ventricles. LEFT cerebral peduncle volume loss consistent with low area degeneration. Old LEFT thalamus and LEFT basal ganglia infarcts. Patchy supratentorial white matter hypodensities. No acute large vascular territory infarcts. No hydrocephalus. No abnormal extra-axial fluid collections. VASCULAR: Mild calcific atherosclerosis carotid siphons. SKULL/SOFT TISSUES: No skull fracture. Old bilateral nasal bone fractures. No significant soft tissue swelling. Small LEFT parietal scalp subcutaneous nodule. ORBITS/SINUSES: The included ocular globes and orbital contents are normal.Trace paranasal sinus mucosal thickening. Mastoid air cells are well aerated. OTHER: None. IMPRESSION: 1. No acute intracranial process. 2. Old LEFT PCA and posterior border zone territory infarcts. Old LEFT  basal ganglia and LEFT thalamus infarcts. 3. Mild chronic small vessel ischemic changes. Electronically Signed   By: Awilda Metro M.D.   On: 10/26/2018 17:16    EKG: Independently reviewed.    Assessment/Plan Principal Problem:   Seizures (HCC) Secondary to medication noncompliance. Observation/telemetry. Received Keppra 1500 mg IVPB x1 in the ER. Keppra 1000 mg p.o. x1 before bedtime Restart Keppra 500 mg p.o. twice daily in a.m. Lorazepam as needed for further seizures. Consult neurology if seizures recur.  Active Problems:   HLD (hyperlipidemia) No longer taking atorvastatin. Given history of noncompliance and EtOH use, will not resume.    Type 2 mellitus with peripheral neuropathy  (HCC) Currently not taking medications. Carbohydrate modified diet. CBG monitoring regular insulin sliding scale while in the hospital.    Benign essential HTN Not on antihypertensives. Has lost a significant amount of weight. Monitor blood pressure. And start therapy if needed.  Alcohol abuse Patient stated he is no longer drinking.    Pulmonary embolism (HCC)   DVT (deep venous thrombosis) (HCC) No longer taking Xarelto. I will only do prophylaxis dose while in the hospital.   DVT prophylaxis: Lovenox SQ. Code Status: Full code. Family Communication:  Disposition Plan: Observation for seizures. Consults called: Admission status: Observation/telemetry.   Bobette Mo MD [facility]  10/27/2018, 12:21 AM   This document was prepared using Dragon voice recognition software and may contain some unintended transcription errors.

## 2018-10-27 NOTE — Telephone Encounter (Signed)
Called pharmacy, they confirmed that patient has refills of both keppra and xarelto. It is not $700 both medications are a total of $5.20 and are ready for pick up. Contacted patients sister and provided her with this information. Maryjean Morn, CMA

## 2018-10-27 NOTE — TOC Progression Note (Signed)
Transition of Care [facility]) - Progression Note    Patient Details  Name: Ronald Forbes MRN: [id number] Date of Birth: [birth year]  Transition of Care [facility]) CM/SW Contact  Bengie Kaucher, Olegario Messier, RN Phone Number: 10/27/2018, 2:55 PM  Clinical Narrative: PT recc HHPT-spoke to sister Darnelle Catalan aware of referral-await if they can accept. Sister says patient has can @ home.       Expected Discharge Plan: Home w Home Health Services Barriers to Discharge: No Barriers Identified  Expected Discharge Plan and Services Expected Discharge Plan: Home w Home Health Services     Living arrangements for the past 2 months: Single Family Home(lives with sister [name])                           Social Determinants of Health (SDOH) Interventions    Readmission Risk Interventions  No flowsheet data found.

## 2018-10-27 NOTE — Evaluation (Signed)
Physical Therapy Evaluation Patient Details Name: Ronald Forbes MRN: [id number] DOB: 27-Jun-1961 Today's Date: 10/27/2018   History of Present Illness  Pt is a 58 year old male with hx of CVA with R sided residual deficits, R AKA, DM, ETOH and tobacco abuse and admitted for seizures due to noncompliance of meds.  Clinical Impression  Pt admitted with above diagnosis. Pt currently with functional limitations due to the deficits listed below (see PT Problem List).  Pt will benefit from skilled PT to increase their independence and safety with mobility to allow discharge to the venue listed below.  Pt assisted to standing and requires L UE support (R UE unable to bear weight due to hemiplegia).  Pt poor historian with home environment however states no stairs and that he uses SPC.  Pt appears to require more support than just [facility] however difficult say which assistive device would be of best use since that will dependent on home environment as well.  Pt reports small spaces.  Recommend HHPT to further assist with safe mobility and DME.     Follow Up Recommendations Home health PT;Supervision/Assistance - 24 hour    Equipment Recommendations  Other (comment)(per HHPT, pt reports small space yet needs support, possible hemiwalker however better assessed in pt's home environment)    Recommendations for Other Services       Precautions / Restrictions Precautions Precautions: Fall Precaution Comments: R AKA, R UE hemiplegia      Mobility  Bed Mobility Overal bed mobility: Needs Assistance Bed Mobility: Supine to Sit;Sit to Supine     Supine to sit: Supervision Sit to supine: Supervision      Transfers Overall transfer level: Needs assistance Equipment used: Rolling walker (2 wheeled) Transfers: Sit to/from Stand Sit to Stand: Min guard         General transfer comment: min/guard for safety, pt utilized RW on left side and therapist pushed down on RW on right side until pt slowly  placed R hand down however unable to bear weight on R arm  Ambulation/Gait                Stairs            Wheelchair Mobility    Modified Rankin (Stroke Patients Only)       Balance Overall balance assessment: Needs assistance         Standing balance support: Single extremity supported Standing balance-Leahy Scale: Poor Standing balance comment: requires at least one UE support                             Pertinent Vitals/Pain Pain Assessment: No/denies pain    Home Living Family/patient expects to be discharged to:: Private residence Living Arrangements: Other relatives Available Help at Discharge: Family(lives with sister, she provides assist) Type of Home: Apartment Home Access: Level entry     Home Layout: One level Home Equipment: Cane - single point      Prior Function Level of Independence: Independent with assistive device(s)         Comments: pt reports using SPC for mobility     Hand Dominance        Extremity/Trunk Assessment   Upper Extremity Assessment Upper Extremity Assessment: RUE deficits/detail RUE Deficits / Details: hx of CVA, residual R UE hemiplegia    Lower Extremity Assessment Lower Extremity Assessment: RLE deficits/detail;Generalized weakness RLE Deficits / Details: hx R AKA  Communication   Communication: Expressive difficulties  Cognition Arousal/Alertness: Awake/alert                                     General Comments: appropriate however not able to provide details on well on previous mobility and devices      General Comments      Exercises     Assessment/Plan    PT Assessment Patient needs continued PT services  PT Problem List Decreased strength;Decreased mobility;Decreased knowledge of use of DME;Decreased activity tolerance;Decreased balance       PT Treatment Interventions DME instruction;Functional mobility training;Balance  training;Patient/family education;Wheelchair mobility training;Therapeutic exercise;Gait training;Therapeutic activities;Neuromuscular re-education    PT Goals (Current goals can be found in the Care Plan section)  Acute Rehab PT Goals PT Goal Formulation: With patient Time For Goal Achievement: 11/03/18 Potential to Achieve Goals: Good    Frequency Min 3X/week   Barriers to discharge        Co-evaluation               AM-PAC PT "6 Clicks" Mobility  Outcome Measure Help needed turning from your back to your side while in a flat bed without using bedrails?: None Help needed moving from lying on your back to sitting on the side of a flat bed without using bedrails?: None Help needed moving to and from a bed to a chair (including a wheelchair)?: A Little Help needed standing up from a chair using your arms (e.g., wheelchair or bedside chair)?: A Little Help needed to walk in hospital room?: A Lot Help needed climbing 3-5 steps with a railing? : Total 6 Click Score: 17    End of Session Equipment Utilized During Treatment: Gait belt Activity Tolerance: Patient tolerated treatment well Patient left: with call bell/phone within reach;in bed;with bed alarm set Nurse Communication: Mobility status PT Visit Diagnosis: Other abnormalities of gait and mobility (R26.89)    Time: 7353-2992 PT Time Calculation (min) (ACUTE ONLY): 11 min   Charges:   PT Evaluation $PT Eval Low Complexity: 1 Low         Zenovia Jarred, PT, DPT Acute Rehabilitation Services Office: [phone] Pager: [phone]  Sarajane Jews 10/27/2018, 3:58 PM

## 2018-10-27 NOTE — TOC Initial Note (Signed)
Transition of Care [facility]) - Initial/Assessment Note    Patient Details  Name: Ronald Forbes MRN: [id number] Date of Birth: Feb 20, 1961  Transition of Care [facility]) CM/SW Contact:    Lanier Clam, RN Phone Number: 10/27/2018, 2:41 PM  Clinical Narrative: Patient defers to sister Ronald Forbes [facility] [phone] says patient will want to go home, not to a rehab facility-will await PT cons. Informed sister Ronald Forbes of observation status-she voiced understanding.                   Expected Discharge Plan: Home w Home Health Services Barriers to Discharge: No Barriers Identified   Patient Goals and CMS Choice Patient states their goals for this hospitalization and ongoing recovery are:: (deferred to sister Ronald Forbes home)      Expected Discharge Plan and Services Expected Discharge Plan: Home w Home Health Services     Living arrangements for the past 2 months: Single Family Home(lives with sister Ronald Forbes [facility])                          Prior Living Arrangements/Services Living arrangements for the past 2 months: Single Family Home(lives with sister Ronald Forbes [facility]) Lives with:: Siblings Patient language and need for interpreter reviewed:: Yes Do you feel safe going back to the place where you live?: Yes      Need for Family Participation in Patient Care: Yes (Comment) Care giver support system in place?: Yes (comment)   Criminal Activity/Legal Involvement Pertinent to Current Situation/Hospitalization: No - Comment as needed  Activities of Daily Living Home Assistive Devices/Equipment: Wheelchair ADL Screening (condition at time of admission) Patient's cognitive ability adequate to safely complete daily activities?: Yes Is the patient deaf or have difficulty hearing?: No Does the patient have difficulty seeing, even when wearing glasses/contacts?: No Does the patient have difficulty concentrating, remembering, or making decisions?: No Patient able to express need for  assistance with ADLs?: Yes Does the patient have difficulty dressing or bathing?: Yes Independently performs ADLs?: Yes (appropriate for developmental age) Does the patient have difficulty walking or climbing stairs?: Yes Weakness of Legs: Left(Right BKA) Weakness of Arms/Hands: Both  Permission Sought/Granted Permission sought to share information with : Case Manager Permission granted to share information with : Yes, Verbal Permission Granted(sister Ronald Forbes)              Emotional Assessment Appearance:: Appears stated age Attitude/Demeanor/Rapport: Gracious Affect (typically observed): Accepting Orientation: : Oriented to Self, Oriented to Place, Oriented to  Time Alcohol / Substance Use: Tobacco Use, Alcohol Use    Admission diagnosis:  Seizure [facility]) [R56.9] Patient Active Problem List   Diagnosis Date Noted  . Pressure injury of skin 07/12/2017  . Acute pulmonary embolism (HCC) 07/11/2017  . Alcohol abuse 07/11/2017  . Pulmonary embolism (HCC) 07/11/2017  . Seizure (HCC) 01/25/2017  . Depression 01/25/2017  . DVT (deep venous thrombosis) (HCC) 01/25/2017  . Seizures (HCC) 01/25/2017  . Hemiparesis affecting right side as late effect of cerebrovascular accident (CVA) (HCC) 12/07/2016  . Type 2 diabetes mellitus with diabetic peripheral angiopathy without gangrene, without long-term current use of insulin (HCC)   . 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 History of CVA with residual deficit   . Phantom limb pain (HCC)   . 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 Unilateral AKA, right (HCC)   . Diabetic peripheral neuropathy (HCC)   .  Diabetes mellitus type 2 in nonobese (HCC)   . Reactive hypertension   . Type 2 diabetes mellitus with peripheral neuropathy (HCC)   . Benign essential HTN   . Hypokalemia   . Hypoalbuminemia due to protein-calorie malnutrition (HCC)   . Acute blood loss anemia   .  Debilitated 10/28/2016  . Ischemia of right lower extremity 10/25/2016  . PAD (peripheral artery disease) (HCC) 10/20/2016  . Thyroid nodule 02/29/2016  . 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 Cerebrovascular accident (CVA) due to embolism of left posterior cerebral artery (HCC)   . Diabetes mellitus type 2 in obese (HCC) 02/07/2016  . HLD (hyperlipidemia)   . Tobacco abuse 02/04/2016  . Alcohol use disorder 02/04/2016  . Morbid obesity (HCC) 02/04/2016  . CVA (cerebral vascular accident) (HCC) 02/04/2016   PCP:  Loletta Specter, PA-C Pharmacy:   [id number] - [place],  - [address] AT [facility] OF [address] & [address] [facility] [address] Phone: [phone] Fax: [phone]     Social Determinants of Health (SDOH) Interventions    Readmission Risk Interventions  No flowsheet data found.

## 2018-10-27 NOTE — TOC Progression Note (Signed)
Transition of Care [facility]) - Progression Note    Patient Details  Name: Ronald Forbes MRN: [id number] Date of Birth: [birth year]  Transition of Care [facility]) CM/SW Contact  Ronald Forbes, Ronald Messier, RN Phone Number: 10/27/2018, 4:14 PM  Clinical Narrative:  [facility] accepted for [facility]. Provided patient w/goodrx discount coupon for Keppra$12.18(sister [name] says they will work out the cost) Patient has script coverage-no patient asst program through the hospital available. No further CM needs.     Expected Discharge Plan: Home w Home Health Services Barriers to Discharge: No Barriers Identified  Expected Discharge Plan and Services Expected Discharge Plan: Home w Home Health Services     Living arrangements for the past 2 months: Single Family Home(lives with sister Ronald Forbes [name])                           Social Determinants of Health (SDOH) Interventions    Readmission Risk Interventions 30 Day Unplanned Readmission Risk Score     ED to Hosp-Admission (Current) from 10/26/2018 in [facility] TELEMETRY/UROLOGY [facility]AST  30 Day Unplanned Readmission Risk Score (%)  13 Filed at 10/27/2018 1600     This score is the patient's risk of an unplanned readmission within 30 days of being discharged (0 -100%). The score is based on dignosis, age, lab data, medications, orders, and past utilization.   Low:  0-14.9   Medium: 15-21.9   High: 22-29.9   Extreme: 30 and above       No flowsheet data found.

## 2018-10-27 NOTE — Care Management Obs Status (Signed)
MEDICARE OBSERVATION STATUS NOTIFICATION   Patient Details  Name: Ronald Forbes MRN: [id number] Date of Birth: Nov 05, 1960   Medicare Observation Status Notification Given:  Yes    MahabirOlegario Messier, RN 10/27/2018, 2:53 PM

## 2018-10-28 LAB — COMPREHENSIVE METABOLIC PANEL
ALT: 48 U/L — ABNORMAL HIGH (ref 0–44)
AST: 124 U/L — ABNORMAL HIGH (ref 15–41)
Albumin: 3.5 g/dL (ref 3.5–5.0)
Alkaline Phosphatase: 65 U/L (ref 38–126)
Anion gap: 7 (ref 5–15)
BUN: 7 mg/dL (ref 6–20)
CO2: 21 mmol/L — ABNORMAL LOW (ref 22–32)
Calcium: 8.3 mg/dL — ABNORMAL LOW (ref 8.9–10.3)
Chloride: 112 mmol/L — ABNORMAL HIGH (ref 98–111)
Creatinine, Ser: 0.65 mg/dL (ref 0.61–1.24)
GFR calc Af Amer: >60 mL/min (ref >60)
GFR calc non Af Amer: >60 mL/min (ref >60)
Glucose, Bld: 106 mg/dL — ABNORMAL HIGH (ref 70–99)
Potassium: 3.8 mmol/L (ref 3.5–5.1)
Sodium: 140 mmol/L (ref 135–145)
Total Bilirubin: 0.7 mg/dL (ref 0.3–1.2)
Total Protein: 6 g/dL — ABNORMAL LOW (ref 6.5–8.1)

## 2018-10-28 LAB — CBC
HEMATOCRIT: 37.3 % — AB (ref 39.0–52.0)
Hemoglobin: 12 g/dL — ABNORMAL LOW (ref 13.0–17.0)
MCH: 30.5 pg (ref 26.0–34.0)
MCHC: 32.2 g/dL (ref 30.0–36.0)
MCV: 94.9 fL (ref 80.0–100.0)
PLATELETS: 113 10*3/uL — AB (ref 150–400)
RBC: 3.93 MIL/uL — ABNORMAL LOW (ref 4.22–5.81)
RDW: 13.2 % (ref 11.5–15.5)
WBC: 6.7 10*3/uL (ref 4.0–10.5)
nRBC: 0 % (ref 0.0–0.2)

## 2018-10-28 LAB — GLUCOSE, CAPILLARY: Glucose-Capillary: 95 mg/dL (ref 70–99)

## 2018-10-28 MED ORDER — LEVETIRACETAM 500 MG PO TABS: 500.0000 mg | ORAL_TABLET | Freq: Two times a day (BID) | ORAL | 0 refills | Status: DC

## 2018-10-28 MED ORDER — ATORVASTATIN CALCIUM 40 MG PO TABS: 40.0000 mg | ORAL_TABLET | Freq: Every day | ORAL | 0 refills | Status: DC

## 2018-10-28 NOTE — TOC Transition Note (Signed)
Transition of Care [facility]) - CM/SW Discharge Note   Patient Details  Name: Ronald Forbes MRN: [id number] Date of Birth: [birth year]  Transition of Care [facility]) CM/SW Contact:  Golda Acre, RN Phone Number: 10/28/2018, 9:04 AM   Clinical Narrative:    Has hhc and medical assistance in place.   Final next level of care: Home w Home Health Services Barriers to Discharge: No Barriers Identified   Patient Goals and CMS Choice Patient states their goals for this hospitalization and ongoing recovery are:: (deferred to sister [place] home)      Discharge Placement  home                     Discharge Plan and Services                        Social Determinants of Health (SDOH) Interventions     Readmission Risk Interventions No flowsheet data found.

## 2018-10-28 NOTE — Progress Notes (Signed)
This RN entered pt room to assess IV pump alarm. Alarm d/t patient side occulusion. Pt stated, "I didn't fucking do anything". This RN asked pt to straighten arm in order to restart IV pump, Pt stated he was not going to move his arm. This RN turned off IV pump and made on call MD aware. Will continue to monitor.

## 2018-10-28 NOTE — Discharge Summary (Signed)
Physician Discharge Summary  Ronald Forbes [facility] FMB:[id number] DOB: 11-17-1960 DOA: 10/26/2018  PCP: Loletta Specter, PA-C  Admit date: 10/26/2018 Discharge date: 10/28/2018  Admitted From: home Disposition: home Recommendations for Outpatient Follow-up:  1. Follow up with PCP in 1-2 weeks 2. Please obtain BMP/CBC in one week  Home Health:yes Equipment/Devices:none Discharge Condition: Stable and improved CODE STATUS full code Diet recommendation cardiac  Brief/Interim Summary:58 y.o. male with medical history significant of alcohol use disorder, embolic CVA, type 2 diabetes, history of pulmonary embolism and DVT, hyperlipidemia, PAD, seizures disorder, current tobacco use, medication noncompliance (has not taken his medications in over a month) who is brought to the emergency department due to having 3 seizure episodes at home and another one in the ER earlier today.  He denies fever, chills, but complains of a headache.  No sore throat, dyspnea, hemoptysis, chest pain, palpitations, dizziness, diaphoresis, PND, orthopnea or pitting edema left lower extremity.  No abdominal pain, nausea, emesis, diarrhea, constipation, melena or hematochezia.  Denies dysuria, frequency or hematuria.  Denies polyuria, polydipsia, polyphagia or blurred vision.  He was given a 1500 mg dose of Keppra  ED Course:  He initial vital signs temperature 97.9 F, pulse 132, respirations 22, blood pressure 151/103 mmHg and O2 sat 95% on room air.  Was given a 1 L normal saline bolus and 1500 mg of Keppra IVPB.  His white count was 19.7 with 81% neutrophils, 13% lymphocytes and 5% monocytes.  Hemoglobin 16.0 g/dL and platelets 701.  CMP shows a CO2 of 11 mmol/L with an anion gap of 26 and glucose level of 176 mg/dL.  All other values are within normal limits.  Imaging: CT head did not have any acute abnormality, but showed old PCA and posterior border zone territory infarcts.  There was an old left basal ganglia and left  thalamus infarct.  There was no active disease on chest radiograph.  Discharge Diagnoses:  Principal Problem:   Seizures (HCC) Active Problems:   HLD (hyperlipidemia)   Diabetes mellitus type 2 in obese (HCC)   Type 2 diabetes mellitus with peripheral neuropathy (HCC)   Benign essential HTN   DVT (deep venous thrombosis) (HCC)   Alcohol abuse   Pulmonary embolism (HCC)  Seizures (HCC) Secondary to medication noncompliance. Received Keppra 1500 mg IVPB x1 in the ER.  Keppra 500 mg twice a day was restarted.  Patient did not have any further seizures after being admitted to hospital. This with patient's sister who says that he has not been taking his seizure meds as well as Xarelto for few months due to cost.  Case manager and social worker have been consulted.  And has given him coupons to get medications at low cost. Active Problems:   HLD (hyperlipidemia) continue atorvastatin he should be on atorvastatin.  He reported that he does not drink alcohol on a daily basis he drinks 1 can of beer once a week.  She lives at home with his sister.    Type 2 mellitus with peripheral neuropathy  (HCC) sugars have been stable at the hospital    Pulmonary embolism [facility])   DVT (deep venous thrombosis) (HCC) patient has not been taking Xarelto due to cost however he should be on Xarelto I will give him a new prescription.   Pressure Injury 07/12/17 Stage I -  Intact skin with non-blanchable redness of a localized area usually over a bony prominence. (Active)  07/12/17 0304  Location: Buttocks  Location Orientation: Medial;Lower  Staging:  Stage I -  Intact skin with non-blanchable redness of a localized area usually over a bony prominence.  Wound Description (Comments):   Present on Admission: Yes    Estimated body mass index is 27.36 kg/m as calculated from the following:   Height as of this encounter:  (1.905 m).   Weight as of this encounter: 99.3 kg.  Discharge  Instructions  Discharge Instructions    Diet - low sodium heart healthy   Complete by:  As directed    Increase activity slowly   Complete by:  As directed      Allergies as of 10/28/2018   No Known Allergies     Medication List    STOP taking these medications   docusate sodium 50 MG capsule Commonly known as:  COLACE     TAKE these medications   atorvastatin 40 MG tablet Commonly known as:  LIPITOR Take 1 tablet (40 mg total) by mouth daily. What changed:  See the new instructions.   gabapentin 300 MG capsule Commonly known as:  NEURONTIN Take 1 capsule (300 mg total) by mouth at bedtime.   levETIRAcetam 500 MG tablet Commonly known as:  KEPPRA Take 1 tablet (500 mg total) by mouth 2 (two) times daily.   rivaroxaban 20 MG Tabs tablet Commonly known as:  Xarelto Take 1 tablet (20 mg total) by mouth daily with supper. START ONLY AFTER YOU HAVE COMPLETED THE STARTER PACK      Follow-up Information    Loletta Specter, PA-C Follow up.   Specialty:  Physician Assistant Contact information: [address] [place] [address] [phone]          No Known Allergies  Consultations: None  Procedures/Studies: Dg Chest 1 View  Result Date: 10/26/2018 CLINICAL DATA:  Seizure. EXAM: CHEST  1 VIEW COMPARISON:  Radiograph of July 13, 2017 FINDINGS: The heart size and mediastinal contours are within normal limits. Both lungs are clear. The visualized skeletal structures are unremarkable. IMPRESSION: No active disease. Electronically Signed   By: Lupita Raider, M.D.   On: 10/26/2018 14:10   Ct Head Wo Contrast  Result Date: 10/26/2018 CLINICAL DATA:  Seizures today. History of stroke, hyperlipidemia and diabetes. EXAM: CT HEAD WITHOUT CONTRAST TECHNIQUE: Contiguous axial images were obtained from the base of the skull through the vertex without intravenous contrast. COMPARISON:  MRI of the head February 24, 2017. FINDINGS: BRAIN: No intraparenchymal  hemorrhage, mass effect nor midline shift. LEFT temporal occipital and LEFT parietal encephalomalacia with ex vacuo dilatation subjacent third and LEFT lateral ventricles. LEFT cerebral peduncle volume loss consistent with low area degeneration. Old LEFT thalamus and LEFT basal ganglia infarcts. Patchy supratentorial white matter hypodensities. No acute large vascular territory infarcts. No hydrocephalus. No abnormal extra-axial fluid collections. VASCULAR: Mild calcific atherosclerosis carotid siphons. SKULL/SOFT TISSUES: No skull fracture. Old bilateral nasal bone fractures. No significant soft tissue swelling. Small LEFT parietal scalp subcutaneous nodule. ORBITS/SINUSES: The included ocular globes and orbital contents are normal.Trace paranasal sinus mucosal thickening. Mastoid air cells are well aerated. OTHER: None. IMPRESSION: 1. No acute intracranial process. 2. Old LEFT PCA and posterior border zone territory infarcts. Old LEFT basal ganglia and LEFT thalamus infarcts. 3. Mild chronic small vessel ischemic changes. Electronically Signed   By: Awilda Metro M.D.   On: 10/26/2018 17:16    (Echo, Carotid, EGD, Colonoscopy, ERCP)    Subjective:  Patient awake alert resting in bed anxious to go home Discharge Exam: Vitals:  10/27/18 2143 10/28/18 0519  BP: 111/71 123/86  Pulse: 65 (!) 59  Resp: 20 20  Temp: 99.3 F (37.4 C) 98.3 F (36.8 C)  SpO2: 97% 97%   Vitals:   10/27/18 0520 10/27/18 1316 10/27/18 2143 10/28/18 0519  BP: 117/79 128/84 111/71 123/86  Pulse: 73 67 65 (!) 59  Resp: Temp: 98.6 F (37 C) 98.9 F (37.2 C) 99.3 F (37.4 C) 98.3 F (36.8 C)  TempSrc: Oral Oral Oral Oral  SpO2: 96% 98% 97% 97%  Weight:      Height:        General: Pt is alert, awake, not in acute distress Cardiovascular: RRR, S1/S2 +, no rubs, no gallops Respiratory: CTA bilaterally, no wheezing, no rhonchi Abdominal: Soft, NT, ND, bowel sounds + Extremities: Right  AKA   The results of significant diagnostics from this hospitalization (including imaging, microbiology, ancillary and laboratory) are listed below for reference.     Microbiology: No results found for this or any previous visit (from the past 240 hour(s)).   Labs: BNP (last 3 results) No results for input(s): BNP in the last 8760 hours. Basic Metabolic Panel: Recent Labs  Lab 10/26/18 1338 10/26/18 2354 10/27/18 0535 10/28/18 0539  NA 140 136 138 140  K 4.1 3.6 3.6 3.8  CL 103 108 109 112*  CO2 11* 20* 19* 21*  GLUCOSE 176* 136* 105* 106*  BUN CREATININE 1.15 0.79 0.73 0.65  CALCIUM 9.5 8.3* 8.3* 8.3*  MG  --  1.9  --   --   PHOS  --  2.9  --   --    Liver Function Tests: Recent Labs  Lab 10/26/18 1338 10/28/18 0539  AST 35 124*  ALT 39 48*  ALKPHOS 103 65  BILITOT 0.6 0.7  PROT 7.9 6.0*  ALBUMIN 4.6 3.5   No results for input(s): LIPASE, AMYLASE in the last 168 hours. No results for input(s): AMMONIA in the last 168 hours. CBC: Recent Labs  Lab 10/26/18 1338 10/27/18 0535  WBC 19.7* 8.1  NEUTROABS 15.9* 5.0  HGB 16.0 12.9*  HCT 50.0 39.9  MCV 96.5 94.1  PLT 168 123*   Cardiac Enzymes: No results for input(s): CKTOTAL, CKMB, CKMBINDEX, TROPONINI in the last 168 hours. BNP: Invalid input(s): POCBNP CBG: Recent Labs  Lab 10/27/18 0733 10/27/18 1137 10/27/18 1630 10/27/18 2145 10/28/18 0754  GLUCAP 98 97 112* 115* 95   D-Dimer No results for input(s): DDIMER in the last 72 hours. Hgb A1c No results for input(s): HGBA1C in the last 72 hours. Lipid Profile No results for input(s): CHOL, HDL, LDLCALC, TRIG, CHOLHDL, LDLDIRECT in the last 72 hours. Thyroid function studies No results for input(s): TSH, T4TOTAL, T3FREE, THYROIDAB in the last 72 hours.  Invalid input(s): FREET3 Anemia work up No results for input(s): VITAMINB12, FOLATE, FERRITIN, TIBC, IRON, RETICCTPCT in the last 72 hours. Urinalysis    Component Value Date/Time    COLORURINE YELLOW 07/13/2017 0017   APPEARANCEUR CLEAR 07/13/2017 0017   LABSPEC 1.024 07/13/2017 0017   PHURINE 5.0 07/13/2017 0017   GLUCOSEU NEGATIVE 07/13/2017 0017   HGBUR SMALL (A) 07/13/2017 0017   BILIRUBINUR NEGATIVE 07/13/2017 0017   KETONESUR NEGATIVE 07/13/2017 0017   PROTEINUR NEGATIVE 07/13/2017 0017   NITRITE NEGATIVE 07/13/2017 0017   LEUKOCYTESUR NEGATIVE 07/13/2017 0017   Sepsis Labs Invalid input(s): PROCALCITONIN,  WBC,  LACTICIDVEN Microbiology No results found for this or any previous visit (from  the past 240 hour(s)).   Time coordinating discharge:  35 minutes  SIGNED:   Alwyn Ren, MD  [facility] 10/28/2018, 8:06 AM Pager   If 7PM-7AM, please contact night-coverage [email or web address] Password [email or web address]

## 2018-10-28 NOTE — Progress Notes (Signed)
Pt has been aggressive toward staff since beginning of shift. Pt has attempted to get out of bed multiple times, setting off bed alarm. When staff responds to bed alarm pt begins to yell profanities at staff and makes threats to those that try to assist him. This RN educated pt that he is a high fall risk and explained that the staff is here to ensure his safety. Pt continued to yell at the writer but agreed to get back in bed. Will continue to monitor pt.

## 2018-11-16 ENCOUNTER — Ambulatory Visit (INDEPENDENT_AMBULATORY_CARE_PROVIDER_SITE_OTHER): Payer: Medicare Other | Admitting: Primary Care

## 2018-11-20 ENCOUNTER — Telehealth: Payer: Self-pay | Admitting: Physician Assistant

## 2018-11-20 ENCOUNTER — Other Ambulatory Visit: Payer: Self-pay | Admitting: Primary Care

## 2018-11-20 DIAGNOSIS — R569 Unspecified convulsions: Secondary | ICD-10-CM

## 2018-11-20 MED ORDER — LEVETIRACETAM 500 MG PO TABS: 500.0000 mg | ORAL_TABLET | Freq: Two times a day (BID) | ORAL | 0 refills | Status: DC

## 2018-11-20 NOTE — Telephone Encounter (Signed)
I will send in Keppra and please refer to neurology

## 2018-11-20 NOTE — Telephone Encounter (Signed)
Please advise.  Last OV 04/2018 Lipid panel- 2018

## 2018-11-20 NOTE — Telephone Encounter (Signed)
1) Medication(s) Requested (by name): atorvastatinn keppra xarelto 2) Pharmacy of Choice:  [id number] - [place], [place] - [address] AT [facility] OF [address] 3) Special Requests:   Approved medications will be sent to the pharmacy, we will reach out if there is an issue.  Requests made after 3pm may not be addressed until the following business day!  If a patient is unsure of the name of the medication(s) please note and ask patient to call back when they are able to provide all info, do not send to responsible party until all information is available!

## 2018-11-21 ENCOUNTER — Other Ambulatory Visit: Payer: Self-pay | Admitting: Primary Care

## 2018-11-21 DIAGNOSIS — I2609 Other pulmonary embolism with acute cor pulmonale: Secondary | ICD-10-CM

## 2018-11-21 DIAGNOSIS — I82401 Acute embolism and thrombosis of unspecified deep veins of right lower extremity: Secondary | ICD-10-CM

## 2018-11-21 NOTE — Progress Notes (Signed)
Unclear why continued Xarelto . I will refill for 30 days . Previously had PE and DVT referred to hemoc and pulmonology. He also has an appt with neurology schedule to manage his seizures.

## 2018-11-22 NOTE — Telephone Encounter (Signed)
Left message on voicemail informing that medication was called in to pharmacy.

## 2018-12-08 ENCOUNTER — Telehealth: Payer: Self-pay | Admitting: Internal Medicine

## 2018-12-08 ENCOUNTER — Encounter: Payer: Self-pay | Admitting: Internal Medicine

## 2018-12-08 NOTE — Telephone Encounter (Signed)
A new patient appt has been scheduled for the pt to see Dr. Melton Alar on 5/8 at 1pm. Letter mailed.

## 2018-12-22 ENCOUNTER — Encounter: Payer: Medicare Other | Admitting: Internal Medicine

## 2018-12-25 ENCOUNTER — Telehealth: Payer: Self-pay | Admitting: Internal Medicine

## 2018-12-25 NOTE — Telephone Encounter (Signed)
Received a msg from Thu saying the pt no showed for his appt on 5/8 w/Dr. Melton Alar. I found this to be odd bc a family member had cld for over a week, including the day before the appt, confirming the date and time. I notified the referring office as well.

## 2018-12-27 ENCOUNTER — Ambulatory Visit: Payer: Medicare Other | Admitting: Neurology

## 2018-12-28 ENCOUNTER — Ambulatory Visit: Payer: Medicare Other | Admitting: Primary Care

## 2019-01-17 ENCOUNTER — Other Ambulatory Visit (INDEPENDENT_AMBULATORY_CARE_PROVIDER_SITE_OTHER): Payer: Self-pay | Admitting: Primary Care

## 2019-01-17 DIAGNOSIS — R569 Unspecified convulsions: Secondary | ICD-10-CM

## 2019-01-18 ENCOUNTER — Other Ambulatory Visit: Payer: Self-pay | Admitting: Primary Care

## 2019-01-18 DIAGNOSIS — R569 Unspecified convulsions: Secondary | ICD-10-CM

## 2019-01-18 MED ORDER — LEVETIRACETAM 500 MG PO TABS: 500.0000 mg | ORAL_TABLET | Freq: Two times a day (BID) | ORAL | 0 refills | Status: DC

## 2019-02-05 ENCOUNTER — Other Ambulatory Visit: Payer: Self-pay | Admitting: Primary Care

## 2019-02-05 DIAGNOSIS — R569 Unspecified convulsions: Secondary | ICD-10-CM

## 2019-02-05 NOTE — Telephone Encounter (Signed)
FWD to PCP. Ronald Forbes S Ronald Forbes, CMA  

## 2019-02-07 ENCOUNTER — Ambulatory Visit: Payer: Medicare Other | Admitting: Neurology

## 2019-02-09 ENCOUNTER — Other Ambulatory Visit (INDEPENDENT_AMBULATORY_CARE_PROVIDER_SITE_OTHER): Payer: Self-pay | Admitting: Primary Care

## 2019-02-09 DIAGNOSIS — R569 Unspecified convulsions: Secondary | ICD-10-CM

## 2019-02-09 NOTE — Telephone Encounter (Signed)
Unable to find [name] on recent med list asked to refer to neurology

## 2019-02-13 ENCOUNTER — Telehealth (INDEPENDENT_AMBULATORY_CARE_PROVIDER_SITE_OTHER): Payer: Self-pay | Admitting: Physician Assistant

## 2019-02-13 NOTE — Telephone Encounter (Signed)
Patients sister is aware that patient needs to be seen by neurologist to have keppra prescribed. He has a few appointments scheduled but is not keeping them. Provided address and phone number to sister so that she can call and see if they will reschedule patient. Nat Christen, CMA

## 2019-02-13 NOTE — Telephone Encounter (Signed)
Patient called to request medication refill for   levETIRAcetam (KEPPRA) IVPB 1500 mg/ 100 mL premix    Patient uses [facility] on [address]   Please advise [phone]  Thank you Ronald Forbes

## 2019-02-19 ENCOUNTER — Ambulatory Visit (INDEPENDENT_AMBULATORY_CARE_PROVIDER_SITE_OTHER): Payer: Medicare Other | Admitting: Primary Care

## 2019-02-19 ENCOUNTER — Telehealth: Payer: Self-pay | Admitting: Neurology

## 2019-02-19 NOTE — Telephone Encounter (Signed)
New Message   *STAT* If patient is at the pharmacy, call can be transferred to refill team.   1. Which medications need to be refilled? (please list name of each medication and dose if known)  lavepiracetam 500 mg twice a day one morning and night  2. Which pharmacy/location (including street and city if local pharmacy) is medication to be sent to? [facility]  3. Do they need a 30 day or 90 day supply?  30 day  Patients sister verbalized patient needs medication prior to appt and he only has a weeks of medication left.  Patients sister also verbalized the NP of the other office could not prescribed rx.  This medication is not listed in patient's chart as of yet.  Please f/u with patient sister.

## 2019-02-20 NOTE — Telephone Encounter (Signed)
Tried calling sister. There was no answer and the voicemail was in [place].  Need to inform that since Dr. Delice Lesch has never seen the pt that she cannot prescribe the medication.  We can offer a sooner appt? Who prescribed the medication originally? Can they get in touch with that Dr?

## 2019-02-21 NOTE — Telephone Encounter (Signed)
Tried calling sister again. There was no answer. Unable to leave message.

## 2019-02-23 ENCOUNTER — Ambulatory Visit (INDEPENDENT_AMBULATORY_CARE_PROVIDER_SITE_OTHER): Payer: Medicare Other | Admitting: Primary Care

## 2019-02-23 ENCOUNTER — Other Ambulatory Visit: Payer: Self-pay

## 2019-02-23 ENCOUNTER — Encounter (INDEPENDENT_AMBULATORY_CARE_PROVIDER_SITE_OTHER): Payer: Self-pay | Admitting: Primary Care

## 2019-02-23 VITALS — BP 130/80 | HR 63 | Temp 98.3°F | Wt 264.0 lb

## 2019-02-23 DIAGNOSIS — E785 Hyperlipidemia, unspecified: Secondary | ICD-10-CM | POA: Diagnosis not present

## 2019-02-23 DIAGNOSIS — D6489 Other specified anemias: Secondary | ICD-10-CM

## 2019-02-23 DIAGNOSIS — F17210 Nicotine dependence, cigarettes, uncomplicated: Secondary | ICD-10-CM

## 2019-02-23 DIAGNOSIS — I10 Essential (primary) hypertension: Secondary | ICD-10-CM | POA: Diagnosis not present

## 2019-02-23 DIAGNOSIS — R569 Unspecified convulsions: Secondary | ICD-10-CM | POA: Diagnosis not present

## 2019-02-23 DIAGNOSIS — Z760 Encounter for issue of repeat prescription: Secondary | ICD-10-CM

## 2019-02-23 MED ORDER — LEVETIRACETAM 500 MG PO TABS: 500.0000 mg | ORAL_TABLET | Freq: Every day | ORAL | 0 refills | Status: DC

## 2019-02-23 NOTE — Progress Notes (Signed)
Acute Office Visit  Subjective:    Patient ID: Ronald Forbes, male    DOB: 19-Apr-1961, 58 y.o.   MRN: [id number]  Chief Complaint  Patient presents with  . Establish Care  . Medication Refill    patient needs keppra until his neurology appt on 8/3    HPI Patient is in today for refill of seizure medication. Explain to his sister and patient I do not manage seizures I will refill Keppra to last to his neurology appointment 03/19/2019. Today I will obtain labs and if abnormal results I will consult my supervising physician for management till appoint ment . He denies shortness of breath, headaches, chest pain or lower extremity edema. PMI: listed below.  Past Medical History:  Diagnosis Date  . Alcohol use disorder   . Cerebral vascular accident ([place])   . Cerebrovascular accident (CVA) due to thrombosis of left posterior cerebral artery ([place])   . Diabetes mellitus type 2 in obese ([place])   . DVT (deep venous thrombosis) ([place]) 01/25/2017  . HLD (hyperlipidemia)   . Morbid obesity ([place])   . PAD (peripheral artery disease) ([place]) 10/20/2016  . Pulmonary embolism ([place]) 07/11/2017  . Seizures ([place])   . Stroke ([place])   . Tobacco abuse     Past Surgical History:  Procedure Laterality Date  . ABDOMINAL AORTOGRAM W/LOWER EXTREMITY Right 10/20/2016   Procedure: Abdominal Aortogram w/Lower Extremity;  Surgeon: Waynetta Sandy, MD;  Location: [facility];  Service: Cardiovascular;  Laterality: Right;  lower leg  . AMPUTATION Right 11/15/2016   Procedure: AMPUTATION ABOVE KNEE;  Surgeon: Elam Dutch, MD;  Location: [facility];  Service: Vascular;  Laterality: Right;  . EP IMPLANTABLE DEVICE N/A 02/09/2016   Procedure: Loop Recorder Insertion;  Surgeon: Deboraha Sprang, MD;  Location: [facility];  Service: Cardiovascular;  Laterality: N/A;  . FEMORAL-TIBIAL BYPASS GRAFT Right 10/25/2016   Procedure: BYPASS GRAFT FEMORAL-TIBIAL ARTERY USING NON REVERSED SAPPHENOUS VEIN;  Surgeon:  Elam Dutch, MD;  Location: [facility];  Service: Vascular;  Laterality: Right;  . INTRAOPERATIVE ARTERIOGRAM Right 10/25/2016   Procedure: INTRA OPERATIVE ARTERIOGRAM;  Surgeon: Elam Dutch, MD;  Location: [facility];  Service: Vascular;  Laterality: Right;  . TEE WITHOUT CARDIOVERSION N/A 02/06/2016   Procedure: TRANSESOPHAGEAL ECHOCARDIOGRAM (TEE);  Surgeon: Josue Hector, MD;  Location: [facility];  Service: Cardiovascular;  Laterality: N/A;    Family History  Problem Relation Age of Onset  . Hypertension Mother     Social History   Socioeconomic History  . Marital status: Single    Spouse name: Not on file  . Number of children: Not on file  . Years of education: Not on file  . Highest education level: Not on file  Occupational History  . Occupation: Dealer  Social Needs  . Financial resource strain: Not on file  . Food insecurity    Worry: Not on file    Inability: Not on file  . Transportation needs    Medical: Not on file    Non-medical: Not on file  Tobacco Use  . Smoking status: Current Every Day Smoker    Packs/day: 0.25    Years: 40.00    Pack years: 10.00    Types: Cigarettes  . Smokeless tobacco: Never Used  . Tobacco comment: Less than 1 pk per day  Substance and Sexual Activity  . Alcohol use: No    Alcohol/week: 0.0 standard drinks    Frequency: Never  Comment: 6 pack/day (past)  . Drug use: Yes    Frequency: 1.0 times per week    Types: Marijuana    Comment: occasional marijuana  . Sexual activity: Not on file  Lifestyle  . Physical activity    Days per week: Not on file    Minutes per session: Not on file  . Stress: Not on file  Relationships  . Social Herbalist on phone: Not on file    Gets together: Not on file    Attends religious service: Not on file    Active member of club or organization: Not on file    Attends meetings of clubs or organizations: Not on file    Relationship status: Not on file  . Intimate partner  violence    Fear of current or ex partner: Not on file    Emotionally abused: Not on file    Physically abused: Not on file    Forced sexual activity: Not on file  Other Topics Concern  . Not on file  Social History Narrative   Lives alone   Used to work as a Dealer    Outpatient Medications Prior to Visit  Medication Sig Dispense Refill  . atorvastatin (LIPITOR) 40 MG tablet Take 1 tablet (40 mg total) by mouth daily. 30 tablet 0  . rivaroxaban (XARELTO) 20 MG TABS tablet Take 1 tablet (20 mg total) by mouth daily with supper. START ONLY AFTER YOU HAVE COMPLETED THE STARTER PACK 90 tablet 3  . levETIRAcetam (KEPPRA) 500 MG tablet Take 500 mg by mouth daily.    Marland Kitchen gabapentin (NEURONTIN) 300 MG capsule Take 1 capsule (300 mg total) by mouth at bedtime. (Patient not taking: Reported on 02/23/2019) 30 capsule 5   No facility-administered medications prior to visit.     No Known Allergies  Review of Systems  Constitutional: Positive for malaise/fatigue.  Musculoskeletal:       Right AKA -Wheel chair bound  Skin: Negative.   Neurological: Positive for weakness.  Psychiatric/Behavioral: Positive for depression.  All other systems reviewed and are negative.      Objective:    Physical Exam  Constitutional: He appears well-developed and well-nourished.  Neck: Neck supple.  Abdominal: Soft. Bowel sounds are normal. He exhibits distension.  Neurological: He is alert.  Not reliable to answer questions appropriately   Skin: Skin is dry.    BP 130/80 (BP Location: Left Arm, Patient Position: Sitting, Cuff Size: Large)   Pulse 63   Temp 98.3 F (36.8 C) (Tympanic)   Wt 264 lb (119.7 kg) Comment: in wheelchair  SpO2 95%   BMI 33.00 kg/m  Wt Readings from Last 3 Encounters:  02/23/19 264 lb (119.7 kg)  10/26/18 218 lb 14.7 oz (99.3 kg)  04/19/18 226 lb 6.4 oz (102.7 kg)    Health Maintenance Due  Topic Date Due  . OPHTHALMOLOGY EXAM  12/03/1971  . Fecal DNA (Cologuard)   12/03/2011  . URINE MICROALBUMIN  08/10/2018  . HEMOGLOBIN A1C  10/18/2018    There are no preventive care reminders to display for this patient.   Lab Results  Component Value Date   TSH <0.010 (L) 07/12/2017   Lab Results  Component Value Date   WBC 6.7 10/28/2018   HGB 12.0 (L) 10/28/2018   HCT 37.3 (L) 10/28/2018   MCV 94.9 10/28/2018   PLT 113 (L) 10/28/2018   Lab Results  Component Value Date   NA 140 10/28/2018   K  3.8 10/28/2018   CO2 21 (L) 10/28/2018   GLUCOSE 106 (H) 10/28/2018   BUN 7 10/28/2018   CREATININE 0.65 10/28/2018   BILITOT 0.7 10/28/2018   ALKPHOS 65 10/28/2018   AST 124 (H) 10/28/2018   ALT 48 (H) 10/28/2018   PROT 6.0 (L) 10/28/2018   ALBUMIN 3.5 10/28/2018   CALCIUM 8.3 (L) 10/28/2018   ANIONGAP 7 10/28/2018   Lab Results  Component Value Date   CHOL 185 08/10/2017   Lab Results  Component Value Date   HDL 29 (L) 08/10/2017   Lab Results  Component Value Date   LDLCALC 98 08/10/2017   Lab Results  Component Value Date   TRIG 290 (H) 08/10/2017   Lab Results  Component Value Date   CHOLHDL 6.4 (H) 08/10/2017   Lab Results  Component Value Date   HGBA1C 5.4 04/19/2018       Assessment & Plan:   Problem List Items Addressed This Visit    Seizure ([place]) - Primary   Relevant Medications   levETIRAcetam (KEPPRA) 500 MG tablet   Other Relevant Orders   Levetiracetam level     Carlyn was seen today for establish care and medication refill.  Diagnoses and all orders for this visit:  Seizure ([place]) Refill Keppra until neurology appointment -     Levetiracetam level  Hyperlipidemia, unspecified hyperlipidemia type  Healthy lifestyle diet of fruits vegetables fish nuts whole grains and low saturated fat . Foods high in cholesterol or liver, fatty meats,cheese, butter avocados, nuts and seeds, chocolate and fried foods. -     Lipid Panel  Benign essential HTN Counseled on blood pressure goal of less than 130/80,  low-sodium, DASH diet, medication compliance, 150 minutes of moderate intensity exercise per week. Discussed medication compliance, adverse effects. -     CMP14+EGFR  Anemia due to other cause, not classified Foods high in iron are shellfish heme iron ( can increase cholesterol iron spinach, liver and organ meats ( can increase cholesterol) legumes, red meats also heme iron, pumpkin seeds, [place], broccoli, tofu , green leafy vegetables and dark chocolate. (heme iron is is well absorbed and 95% of functional iron in the body foods with high heme on or me [place], fish, and beef. -     CBC with Differential  Other orders -     levETIRAcetam (KEPPRA) 500 MG tablet; Take 1 tablet (500 mg total) by mouth daily.    Meds ordered this encounter  Medications  . levETIRAcetam (KEPPRA) 500 MG tablet    Sig: Take 1 tablet (500 mg total) by mouth daily.    Dispense:  30 tablet    Refill:  0     Kerin Perna, NP

## 2019-02-23 NOTE — Patient Instructions (Signed)
Seizure, Adult A seizure is a sudden burst of abnormal electrical activity in the brain. Seizures usually last from 30 seconds to 2 minutes. The abnormal activity temporarily interrupts normal brain function. A seizure can cause many different symptoms depending on where in the brain it starts. What are the causes? Common causes of this condition include:  Fever or infection.  Brain abnormality, injury, bleeding, or tumor.  Low blood sugar.  Metabolic disorders or other conditions that are passed from parent to child (are inherited).  Reaction to a substance, such as a drug or a medicine, or suddenly stopping the use of a substance (withdrawal).  Stroke.  Developmental disorders such as autism or cerebral palsy. In some cases, the cause of this condition may not be known. Some people who have a seizure never have another one. Seizures usually do not cause brain damage or permanent problems unless they are prolonged. A person who has repeated seizures over time without a clear cause has a condition called epilepsy. What increases the risk? You are more likely to develop this condition if you have:  A family history of epilepsy.  Had a tonic-clonic seizure in the past. This is a type of seizure that involves whole-body contraction of muscles and a loss of consciousness.  Autism, cerebral palsy, or other brain disorders.  A history of head trauma, lack of oxygen at birth, or strokes. What are the signs or symptoms? There are many different types of seizures. The symptoms of a seizure vary depending on the type of seizure you have. Examples of symptoms during a seizure include:  Uncontrollable shaking (convulsions).  Stiffening of the body.  Loss of consciousness.  Head nodding.  Staring.  Not responding to sound or touch.  Loss of bladder or bowel control. Some people have symptoms right before a seizure happens (aura) and right after a seizure happens (postictal). Symptoms  before a seizure may include:  Fear or anxiety.  Nausea.  Feeling like the room is spinning (vertigo).  A feeling of having seen or heard something before (dj vu).  Odd tastes or smells.  Changes in vision, such as seeing flashing lights or spots. Symptoms after a seizure may include:  Confusion.  Sleepiness.  Headache.  Weakness on one side of the body. How is this diagnosed? This condition may be diagnosed based on:  A description of your symptoms. Video of your seizures can be helpful.  Your medical history.  A physical exam. You may also have tests, including:  Blood tests.  CT scan.  MRI.  Electroencephalogram (EEG). This test measures electrical activity in the brain. An EEG can predict whether seizures will return (recur).  A spinal tap (also called a lumbar puncture). This is the removal and testing of fluid that surrounds the brain and spinal cord. How is this treated? Most seizures will stop on their own in under 5 minutes, and no treatment is needed. Seizures that last longer than 5 minutes will usually need treatment. Treatment can include:  Medicines given through an IV.  Avoiding known triggers, such as medicines that you take for another condition.  Medicines to treat epilepsy (antiepileptics), if epilepsy caused your seizures.  Surgery to stop seizures, if you have epilepsy that does not respond to medicines. Follow these instructions at home: Medicines  Take over-the-counter and prescription medicines only as told by your health care provider.  Avoid any substances that may prevent your medicine from working properly, such as alcohol. Activity  Do not drive,   swim, or do any other activities that would be dangerous if you had another seizure. Wait until your health care provider says it is safe to do them.  If you live in the [place] check with your local DMV (department of motor vehicles) to find out about local driving laws. Each state  has specific rules about when you can legally return to driving.  Get enough rest. Lack of sleep can make seizures more likely to occur. Educating others Teach friends and family what to do if you have a seizure. They should:  Lay you on the ground to prevent a fall.  Cushion your head and body.  Loosen any tight clothing around your neck.  Turn you on your side. If vomiting occurs, this helps keep your airway clear.  Not hold you down. Holding you down will not stop the seizure.  Not put anything into your mouth.  Know whether or not you need emergency care. For example, they should get help right away if you have a seizure that lasts longer than 5 minutes or have several seizures in a row.  Stay with you until you recover.  General instructions  Contact your health care provider each time you have a seizure.  Avoid anything that has ever triggered a seizure for you.  Keep a seizure diary. Record what you remember about each seizure, especially anything that might have triggered the seizure.  Keep all follow-up visits as told by your health care provider. This is important. Contact a health care provider if:  You have another seizure.  You have seizures more often.  Your seizure symptoms change.  You continue to have seizures with treatment.  You have symptoms of an infection or illness. This might increase your risk of having a seizure. Get help right away if:  You have a seizure that: ? Lasts longer than 5 minutes. ? Is different than previous seizures. ? Leaves you unable to speak or use a part of your body. ? Makes it harder to breathe.  You have: ? A seizure after a head injury. ? Multiple seizures in a row. ? Confusion or a severe headache right after a seizure.  You do not wake up immediately after a seizure.  You injure yourself during a seizure. These symptoms may represent a serious problem that is an emergency. Do not wait to see if the symptoms  will go away. Get medical help right away. Call your local emergency services (911 in the [place] Do not drive yourself to the hospital. Summary  Seizures are caused by abnormal electrical activity in the brain. The activity disrupts normal brain function and can cause various symptoms, such as convulsions, abnormal movements, or a change in consciousness.  There are many causes of seizures, including illnesses, medicines, genetic conditions, head injuries, strokes, tumors, substance abuse, or substance withdrawal.  Most seizures will stop on their own in under 5 minutes. Seizures that last longer than 5 minutes are a medical emergency and require immediate treatment.  Many medicines are used to treat seizures. Take over-the-counter and prescription medicines only as told by your health care provider. This information is not intended to replace advice given to you by your health care provider. Make sure you discuss any questions you have with your health care provider. Document Released: 07/30/2000 Document Revised: 10/20/2018 Document Reviewed: 10/20/2018 Elsevier Patient Education  [place] 911 in the event patient has a SEIZURE

## 2019-02-25 MED ORDER — ATORVASTATIN CALCIUM 40 MG PO TABS: 40.0000 mg | ORAL_TABLET | Freq: Every day | ORAL | 3 refills | Status: DC

## 2019-02-26 LAB — CBC WITH DIFFERENTIAL/PLATELET
Basophils Absolute: 0 10*3/uL (ref 0.0–0.2)
Basos: 0 %
EOS (ABSOLUTE): 0 10*3/uL (ref 0.0–0.4)
Eos: 1 %
Hematocrit: 46.2 % (ref 37.5–51.0)
Hemoglobin: 15.6 g/dL (ref 13.0–17.7)
Immature Grans (Abs): 0 10*3/uL (ref 0.0–0.1)
Immature Granulocytes: 0 %
Lymphocytes Absolute: 2.6 10*3/uL (ref 0.7–3.1)
Lymphs: 39 %
MCH: 30.4 pg (ref 26.6–33.0)
MCHC: 33.8 g/dL (ref 31.5–35.7)
MCV: 90 fL (ref 79–97)
Monocytes Absolute: 0.4 10*3/uL (ref 0.1–0.9)
Monocytes: 6 %
Neutrophils Absolute: 3.7 10*3/uL (ref 1.4–7.0)
Neutrophils: 54 %
Platelets: 128 10*3/uL — ABNORMAL LOW (ref 150–450)
RBC: 5.14 x10E6/uL (ref 4.14–5.80)
RDW: 14 % (ref 11.6–15.4)
WBC: 6.8 10*3/uL (ref 3.4–10.8)

## 2019-02-26 LAB — LIPID PANEL
Chol/HDL Ratio: 3.1 ratio (ref 0.0–5.0)
Cholesterol, Total: 109 mg/dL (ref 100–199)
HDL: 35 mg/dL — ABNORMAL LOW (ref >39)
LDL Calculated: 30 mg/dL (ref 0–99)
Triglycerides: 220 mg/dL — ABNORMAL HIGH (ref 0–149)
VLDL Cholesterol Cal: 44 mg/dL — ABNORMAL HIGH (ref 5–40)

## 2019-02-26 LAB — CMP14+EGFR
ALT: 34 IU/L (ref 0–44)
AST: 19 IU/L (ref 0–40)
Albumin/Globulin Ratio: 1.8 (ref 1.2–2.2)
Albumin: 4 g/dL (ref 3.8–4.9)
Alkaline Phosphatase: 79 IU/L (ref 39–117)
BUN/Creatinine Ratio: 14 (ref 9–20)
BUN: 11 mg/dL (ref 6–24)
Bilirubin Total: 0.3 mg/dL (ref 0.0–1.2)
CO2: 19 mmol/L — ABNORMAL LOW (ref 20–29)
Calcium: 9.1 mg/dL (ref 8.7–10.2)
Chloride: 105 mmol/L (ref 96–106)
Creatinine, Ser: 0.78 mg/dL (ref 0.76–1.27)
GFR calc Af Amer: 116 mL/min/{1.73_m2} (ref >59)
GFR calc non Af Amer: 100 mL/min/{1.73_m2} (ref >59)
Globulin, Total: 2.2 g/dL (ref 1.5–4.5)
Glucose: 133 mg/dL — ABNORMAL HIGH (ref 65–99)
Potassium: 4 mmol/L (ref 3.5–5.2)
Sodium: 139 mmol/L (ref 134–144)
Total Protein: 6.2 g/dL (ref 6.0–8.5)

## 2019-02-26 LAB — LEVETIRACETAM LEVEL: Levetiracetam Lvl: 14.9 ug/mL (ref 10.0–40.0)

## 2019-03-02 ENCOUNTER — Encounter (INDEPENDENT_AMBULATORY_CARE_PROVIDER_SITE_OTHER): Payer: Self-pay

## 2019-03-08 ENCOUNTER — Telehealth: Payer: Self-pay | Admitting: Emergency Medicine

## 2019-03-08 NOTE — Telephone Encounter (Signed)
Patient called. Nurse talked to sister who is not on DAR form and when [name] answered the phone he hung up,   No information was able to be given.

## 2019-03-19 ENCOUNTER — Other Ambulatory Visit: Payer: Self-pay

## 2019-03-19 ENCOUNTER — Encounter: Payer: Self-pay | Admitting: Neurology

## 2019-03-19 ENCOUNTER — Ambulatory Visit (INDEPENDENT_AMBULATORY_CARE_PROVIDER_SITE_OTHER): Payer: Medicare Other | Admitting: Neurology

## 2019-03-19 VITALS — BP 126/82 | HR 89 | Temp 98.3°F | Ht 75.0 in | Wt 264.0 lb

## 2019-03-19 DIAGNOSIS — Z8673 Personal history of transient ischemic attack (TIA), and cerebral infarction without residual deficits: Secondary | ICD-10-CM

## 2019-03-19 DIAGNOSIS — G40209 Localization-related (focal) (partial) symptomatic epilepsy and epileptic syndromes with complex partial seizures, not intractable, without status epilepticus: Secondary | ICD-10-CM | POA: Diagnosis not present

## 2019-03-19 MED ORDER — LEVETIRACETAM 500 MG PO TABS: 500.0000 mg | ORAL_TABLET | Freq: Every day | ORAL | 3 refills | Status: DC

## 2019-03-19 NOTE — Progress Notes (Signed)
NEUROLOGY CONSULTATION NOTE  Ronald Forbes MRN: [id number] DOB: [birth year]  Referring provider: Gwinda PasseMichelle Edwards, NP Primary care provider: Gwinda PasseMichelle Edwards, NP  Reason for consult:  seizures   Thank you for your kind referral of Ronald Forbes [facility] for consultation of the above symptoms. Although his history is well known to you, please allow me to reiterate it for the purpose of our medical record. The patient was accompanied to the clinic by his sister Ronald Forbes who also provides collateral information. Records and images were personally reviewed where available.  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 His sister reports that he had seizures at age 58 and was on a seizure medication in the past but was not taking it like now. Review of records indicate that he had no medical care until he had a stroke in 2017, then was admitted in June 2018 when he had 3 seizures in one day. EEG showed mild left hemisphere focal slowing. I personally reviewed MRI brain without contrast done 01/2017 which did not show any acute changes, there was a remote left PCA territory infarct with extensive surrounding encephalomalacia also involving the medial left temporal lobe. It notes that he ran out of medication, however prior to June 2018, he was not on an AED for seizures, he was on gabapentin for pain. He was discharged home on Levetiracetam 500mg  BID. He was back in the hospital in March 2020 for 4 seizures, again in the setting of medication noncompliance. He and his sister report that when he is taking the Levetiracetam 500mg  BID, he does not have any seizures. He unfortunately again ran out of medication for the past week and had a seizure this morning in our office lobby. He was noted by staff post-ictally  to have a glazed look, initially not responding, covered in drool, then agitated. He and his sister deny any prior warning symptoms, she notes he usually leans to the left when he has a seizure. His sister moved in to live with him 2 years ago after his right BKA. He manages his own medications. His sister denies any staring/unresponsive episodes, he denies any olfactory/gustatory hallucinations, deja vu, rising epigastric sensation, focal numbness/tingling, myoclonic jerks. He denies any headaches, dizziness, neck/back pain, bowel/bladder dysfunction. He is wheelchair-bound and does his own transfers at home. His sister states he does not drink a lot of alcohol, he drinks a beer every once in a while.   Epilepsy Risk Factors:  Large left PCA stroke with encephalomalacia involving left medial temporal lobe. Otherwise he had a normal birth and early development.  There is no history of febrile convulsions, CNS infections such as meningitis/encephalitis, significant traumatic brain injury, neurosurgical procedures, or family history of seizures.   PAST MEDICAL HISTORY: Past Medical History:  Diagnosis Date   Alcohol use disorder    Cerebral vascular accident [facility](HCC)    Cerebrovascular accident (CVA) due to thrombosis of left posterior cerebral artery (HCC)    Diabetes mellitus type 2 in obese (HCC)    DVT (deep venous thrombosis) (HCC) 01/25/2017   HLD (hyperlipidemia)    Morbid obesity (HCC)    PAD (peripheral artery disease) (HCC) 10/20/2016   Pulmonary embolism (HCC) 07/11/2017   Seizures (HCC)    Stroke (HCC)    Tobacco abuse     PAST SURGICAL  HISTORY: Past Surgical History:  Procedure Laterality Date   ABDOMINAL AORTOGRAM W/LOWER EXTREMITY Right 10/20/2016   Procedure: Abdominal Aortogram w/Lower Extremity;  Surgeon: Waynetta Sandy, MD;  Location: [place] [facility];  Service: Cardiovascular;  Laterality: Right;  lower leg   AMPUTATION Right 11/15/2016   Procedure:  AMPUTATION ABOVE KNEE;  Surgeon: Elam Dutch, MD;  Location: [place];  Service: Vascular;  Laterality: Right;   EP IMPLANTABLE DEVICE N/A 02/09/2016   Procedure: Loop Recorder Insertion;  Surgeon: Deboraha Sprang, MD;  Location: [place] [facility];  Service: Cardiovascular;  Laterality: N/A;   FEMORAL-TIBIAL BYPASS GRAFT Right 10/25/2016   Procedure: BYPASS GRAFT FEMORAL-TIBIAL ARTERY USING NON REVERSED SAPPHENOUS VEIN;  Surgeon: Elam Dutch, MD;  Location: [place];  Service: Vascular;  Laterality: Right;   INTRAOPERATIVE ARTERIOGRAM Right 10/25/2016   Procedure: INTRA OPERATIVE ARTERIOGRAM;  Surgeon: Elam Dutch, MD;  Location: [place];  Service: Vascular;  Laterality: Right;   TEE WITHOUT CARDIOVERSION N/A 02/06/2016   Procedure: TRANSESOPHAGEAL ECHOCARDIOGRAM (TEE);  Surgeon: Josue Hector, MD;  Location: [facility];  Service: Cardiovascular;  Laterality: N/A;    MEDICATIONS: Current Outpatient Medications on File Prior to Visit  Medication Sig Dispense Refill   atorvastatin (LIPITOR) 40 MG tablet Take 1 tablet (40 mg total) by mouth daily. 30 tablet 3   levETIRAcetam (KEPPRA) 500 MG tablet Take 1 tablet (500 mg total) by mouth daily. 30 tablet 0   rivaroxaban (XARELTO) 20 MG TABS tablet Take 1 tablet (20 mg total) by mouth daily with supper. START ONLY AFTER YOU HAVE COMPLETED THE STARTER PACK 90 tablet 3   No current facility-administered medications on file prior to visit.     ALLERGIES: No Known Allergies  FAMILY HISTORY: Family History  Problem Relation Age of Onset   Hypertension Mother     SOCIAL HISTORY: Social History   Socioeconomic History   Marital status: Single    Spouse name: Not on file   Number of children: Not on file   Years of education: Not on file   Highest education level: Not on file  Occupational History   Occupation: Dealer  Social Needs   Financial resource strain: Not on file   Food insecurity    Worry: Not on file     Inability: Not on file   Transportation needs    Medical: Not on file    Non-medical: Not on file  Tobacco Use   Smoking status: Current Every Day Smoker    Packs/day: 0.25    Years: 40.00    Pack years: 10.00    Types: Cigarettes   Smokeless tobacco: Never Used   Tobacco comment: Less than 1 pk per day  Substance and Sexual Activity   Alcohol use: No    Alcohol/week: 0.0 standard drinks    Frequency: Never    Comment: 6 pack/day (past)   Drug use: Yes    Frequency: 1.0 times per week    Types: Marijuana    Comment: occasional marijuana   Sexual activity: Not on file  Lifestyle   Physical activity    Days per week: Not on file    Minutes per session: Not on file   Stress: Not on file  Relationships   Social connections    Talks on phone: Not on file    Gets together: Not on file    Attends religious service: Not on file    Active member of club or organization: Not on  file    Attends meetings of clubs or organizations: Not on file    Relationship status: Not on file   Intimate partner violence    Fear of current or ex partner: Not on file    Emotionally abused: Not on file    Physically abused: Not on file    Forced sexual activity: Not on file  Other Topics Concern   Not on file  Social History Narrative   Lives alone   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    REVIEW OF SYSTEMS: Constitutional: No fevers, chills, or sweats, no generalized fatigue, change in appetite Eyes: No visual changes, double vision, eye pain Ear, nose and throat: No hearing loss, ear pain, nasal congestion, sore throat Cardiovascular: No chest pain, palpitations Respiratory:  No shortness of breath at rest or with exertion, wheezes GastrointestinaI: No nausea, vomiting, diarrhea, abdominal pain, fecal incontinence Genitourinary:  No dysuria, urinary retention or frequency Musculoskeletal:  No neck pain, back pain Integumentary: No rash, pruritus, skin lesions Neurological: as  above Psychiatric: No depression, insomnia, anxiety Endocrine: No palpitations, fatigue, diaphoresis, mood swings, change in appetite, change in weight, increased thirst Hematologic/Lymphatic:  No anemia, purpura, petechiae. Allergic/Immunologic: no itchy/runny eyes, nasal congestion, recent allergic reactions, rashes  PHYSICAL EXAM: Vitals:   03/19/19 0822  BP: 126/82  Pulse: 89  Temp: 98.3 F (36.8 C)  SpO2: 93%   General: No acute distress Head:  Normocephalic/atraumatic Skin/Extremities: No rash, no edema, s/p right AKA Neurological Exam: Mental status: alert and oriented to person, place, and time, no dysarthria. He has mild expressive aphasia with word-finding difficulties, able to repeat, difficulty naming "sleeve," could name "thumb." Fund of knowledge is reduced.  Recent and remote memory are impaired.  Attention and concentration are reduced.  Cranial nerves: CN I: not tested CN II: pupils equal, round and reactive to light, visual fields intact CN III, IV, VI:  full range of motion, no nystagmus, no ptosis CN V: facial sensation intact CN VII: upper and lower face symmetric CN VIII: hearing intact to finger rub CN IX, X: gag intact, uvula midline CN XI: sternocleidomastoid and trapezius muscles intact CN XII: tongue midline Bulk & Tone: normal, no fasciculations. Motor: 5/5 on left UE and LE, 5/5 right shoulder abduction, elbow extension, 3/5 wrist extension, 0/5 finger extension with contractures. 5/5 right hip flexion (s/p right AKA). 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 He and his sister  report that he is seizure-free when compliant with medication, we discussed that Levetiracetam 500mg  BID is a low dose, we can continue on current dose for now but if seizures recur while compliant, we will increase dose to 750mg  BID. He does not drive. Continue control of vascular risk factors for secondary stroke prevention, he is on Xarelto. Follow-up in 3 months, they know to call for any changes.   Thank you for allowing me to participate in the care of this patient. Please do not hesitate to call for any questions or concerns.   Patrcia DollyKaren Montana Fassnacht, M.D.  CC: Gwinda PasseMichelle Edwards, NP

## 2019-03-19 NOTE — Patient Instructions (Signed)
1. Restart Keppra 500mg : take 1 tablet twice a day. If we keep having seizures even while taking medication regularly, we will need to increase the dose   2. Follow-up in 3 months, call for any changes  Seizure Precautions: 1. If medication has been prescribed for you to prevent seizures, take it exactly as directed.  Do not stop taking the medicine without talking to your doctor first, even if you have not had a seizure in a long time.   2. Avoid activities in which a seizure would cause danger to yourself or to others.  Don't operate dangerous machinery, swim alone, or climb in high or dangerous places, such as on ladders, roofs, or girders.  Do not drive unless your doctor says you may.  3. If you have any warning that you may have a seizure, lay down in a safe place where you can't hurt yourself.    4.  No driving for 6 months from last seizure, as per [facility].   Please refer to the following link on the [place] website for more information: http://www.epilepsyfoundation.org/answerplace/Social/driving/drivingu.cfm   5.  Maintain good sleep hygiene. Avoid alcohol  6.  Contact your doctor if you have any problems that may be related to the medicine you are taking.  7.  Call 911 and bring the patient back to the ED if:        A.  The seizure lasts longer than 5 minutes.       B.  The patient doesn't awaken shortly after the seizure  C.  The patient has new problems such as difficulty seeing, speaking or moving  D.  The patient was injured during the seizure  E.  The patient has a temperature over 102 F (39C)  F.  The patient vomited and now is having trouble breathing

## 2019-03-27 ENCOUNTER — Telehealth: Payer: Self-pay | Admitting: Neurology

## 2019-03-27 NOTE — Telephone Encounter (Signed)
Caller left msg with after hours about medication for patient for 30 days but there should have been 60 because the med is x2 a day, not once a day. And she needs the rest called in. She said the pharm will probably call in also. Thanks!

## 2019-03-28 ENCOUNTER — Other Ambulatory Visit: Payer: Self-pay

## 2019-03-28 MED ORDER — LEVETIRACETAM 500 MG PO TABS: 500.0000 mg | ORAL_TABLET | Freq: Two times a day (BID) | ORAL | 3 refills | Status: DC

## 2019-03-28 NOTE — Telephone Encounter (Signed)
New Rx sent with Rx sig stating 1 tablet twice daily

## 2019-06-15 ENCOUNTER — Ambulatory Visit (INDEPENDENT_AMBULATORY_CARE_PROVIDER_SITE_OTHER): Payer: Medicare Other | Admitting: Neurology

## 2019-06-15 ENCOUNTER — Other Ambulatory Visit: Payer: Self-pay

## 2019-06-15 ENCOUNTER — Encounter: Payer: Self-pay | Admitting: Neurology

## 2019-06-15 VITALS — BP 136/90 | HR 61 | Ht 75.0 in

## 2019-06-15 DIAGNOSIS — G40209 Localization-related (focal) (partial) symptomatic epilepsy and epileptic syndromes with complex partial seizures, not intractable, without status epilepticus: Secondary | ICD-10-CM | POA: Diagnosis not present

## 2019-06-15 DIAGNOSIS — Z8673 Personal history of transient ischemic attack (TIA), and cerebral infarction without residual deficits: Secondary | ICD-10-CM

## 2019-06-15 MED ORDER — LEVETIRACETAM 1000 MG PO TABS: 1000.0000 mg | ORAL_TABLET | Freq: Two times a day (BID) | ORAL | 3 refills | Status: DC

## 2019-06-15 NOTE — Progress Notes (Signed)
NEUROLOGY FOLLOW UP OFFICE NOTE  Ronald Forbes [id number] [birth year]  HISTORY OF PRESENT ILLNESS: I had the pleasure of seeing Ronald Forbes [facility] in follow-up in the neurology clinic on 06/15/2019.  The patient was last seen 2 months ago for seizures secondary to stroke. He is again accompanied by his sister who helps supplement the history today. Since his last visit, they report 1 seizure 2 weeks ago. He is on Levetiracetam 500mg  BID. It is unclear if he missed a dose or two leading up to the recent seizure. He manages his own medications. His sister denies any staring/unresponsive episodes, he denies any olfactory/gustatory hallucinations, focal numbness/tingling, myoclonic jerks. No headaches, dizziness, vision changes, no falls. He is wheelchair-bound s/p right AKA.   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 His sister reports that he had seizures at age 67 and was on a seizure medication in the past but was not taking it like now. Review of records indicate that he had no medical care until he had a stroke in 2017, then was admitted in June 2018 when he had 3 seizures in one day. EEG showed mild left hemisphere focal slowing. I personally reviewed MRI brain without contrast done 01/2017 which did not show any acute changes, there was a remote left PCA territory infarct with extensive surrounding encephalomalacia also involving the medial left temporal lobe. It notes that he ran out of medication, however prior to June 2018, he was not on an AED for seizures, he was on gabapentin for pain. He was discharged home on Levetiracetam 500mg  BID. He was back in the hospital in March 2020 for 4 seizures, again in the setting of medication noncompliance. He and his sister report that when he  is taking the Levetiracetam 500mg  BID, he does not have any seizures. He unfortunately again ran out of medication for the past week and had a seizure this morning in our office lobby. He was noted by staff post-ictally to have a glazed look, initially not responding, covered in drool, then agitated. He and his sister deny any prior warning symptoms, she notes he usually leans to the left when he has a seizure. His sister moved in to live with him 2 years ago after his right BKA. He manages his own medications. His sister denies any staring/unresponsive episodes, he denies any olfactory/gustatory hallucinations, deja vu, rising epigastric sensation, focal numbness/tingling, myoclonic jerks. He denies any headaches, dizziness, neck/back pain, bowel/bladder dysfunction. He is wheelchair-bound and does his own transfers at home. His sister states he does not drink a lot of alcohol, he drinks a beer every once in a while.   Epilepsy Risk Factors:  Large left PCA stroke with encephalomalacia involving left medial temporal lobe. Otherwise he had a normal birth and early development.  There is no history of febrile convulsions, CNS infections such as meningitis/encephalitis, significant traumatic brain injury, neurosurgical procedures, or family history of seizures.   PAST MEDICAL HISTORY: Past Medical History:  Diagnosis Date   Alcohol use disorder    Cerebral vascular accident [facility])    Cerebrovascular accident (CVA) due to thrombosis of left posterior cerebral artery ([place])    Diabetes mellitus type 2 in obese ([place])    DVT (deep venous thrombosis) ([place]) 01/25/2017   HLD (hyperlipidemia)    Morbid obesity (  HCC)    PAD (peripheral artery disease) (HCC) 10/20/2016   Pulmonary embolism (HCC) 07/11/2017   Seizures (HCC)    Stroke [facility])    Tobacco abuse    Outpatient Encounter Medications as of 06/15/2019  Medication Sig Note   atorvastatin (LIPITOR) 40 MG tablet Take 1 tablet (40 mg total) by  mouth daily.    rivaroxaban (XARELTO) 20 MG TABS tablet Take 1 tablet (20 mg total) by mouth daily with supper. START ONLY AFTER YOU HAVE COMPLETED THE STARTER PACK 10/26/2018: Last dispensed from [facility] on 10/25/18.     levETIRAcetam (KEPPRA) 500 MG tablet Take 1 tablet (500 mg total) by mouth 2 (two) times daily.         No facility-administered encounter medications on file as of 06/15/2019.    ALLERGIES: No Known Allergies  FAMILY HISTORY: Family History  Problem Relation Age of Onset   Hypertension Mother     SOCIAL HISTORY: Social History   Socioeconomic History   Marital status: Single    Spouse name: Not on file   Number of children: Not on file   Years of education: Not on file   Highest education level: Not on file  Occupational History   Occupation: Curator  Social Needs   Financial resource strain: Not on file   Food insecurity    Worry: Not on file    Inability: Not on file   Transportation needs    Medical: Not on file    Non-medical: Not on file  Tobacco Use   Smoking status: Current Every Day Smoker    Packs/day: 0.25    Years: 40.00    Pack years: 10.00    Types: Cigarettes   Smokeless tobacco: Never Used   Tobacco comment: Less than 1 pk per day  Substance and Sexual Activity   Alcohol use: No    Alcohol/week: 0.0 standard drinks    Frequency: Never    Comment: 6 pack/day (past)   Drug use: Yes    Frequency: 1.0 times per week    Types: Marijuana    Comment: occasional marijuana   Sexual activity: Not Currently  Lifestyle   Physical activity    Days per week: Not on file    Minutes per session: Not on file   Stress: Not on file  Relationships   Social connections    Talks on phone: Not on file    Gets together: Not on file    Attends religious service: Not on file    Active member of club or organization: Not on file    Attends meetings of clubs or organizations: Not on file    Relationship status: Not on file     Intimate partner violence    Fear of current or ex partner: Not on file    Emotionally abused: Not on file    Physically abused: Not on file    Forced sexual activity: Not on file  Other Topics Concern   Not on file  Social History Narrative   Lives alone   Used to work as a Air cabin crew bound.   Right leg amputated    REVIEW OF SYSTEMS: Constitutional: No fevers, chills, or sweats, no generalized fatigue, change in appetite Eyes: No visual changes, double vision, eye pain Ear, nose and throat: No hearing loss, ear pain, nasal congestion, sore throat Cardiovascular: No chest pain, palpitations Respiratory:  No shortness of breath at rest or with exertion, wheezes GastrointestinaI: No nausea, vomiting, diarrhea, abdominal  pain, fecal incontinence Genitourinary:  No dysuria, urinary retention or frequency Musculoskeletal:  No neck pain, back pain Integumentary: No rash, pruritus, skin lesions Neurological: as above Psychiatric: No depression, insomnia, anxiety Endocrine: No palpitations, fatigue, diaphoresis, mood swings, change in appetite, change in weight, increased thirst Hematologic/Lymphatic:  No anemia, purpura, petechiae. Allergic/Immunologic: no itchy/runny eyes, nasal congestion, recent allergic reactions, rashes  PHYSICAL EXAM: Vitals:   06/15/19 1416  BP: 136/90  Pulse: 61   General: No acute distress Skin/Extremities: No rash, no edema, s/p right AKA Neurological Exam: Mental status: alert and oriented to person, place, and time, no dysarthria. He has mild expressive aphasia (baseline). Fund of knowledge is reduced.  Recent and remote memory are impaired.  Attention and concentration are reduced.  Cranial nerves: CN I: not tested CN II: pupils equal, round and reactive to light, visual fields intact CN III, IV, VI:  full range of motion, no nystagmus, no ptosis Bulk & Tone: increased on right UE and LE Motor: 5/5 on left UE and LE, 5/5 right  shoulder abduction, elbow extension, 3/5 wrist extension, 0/5 finger extension with contractures. 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 He had one seizure in the past 3 months, increase Levetiracetam to 1000mg  BID. He does not drive. Continue control of vascular risk factors for secondary stroke prevention, he is on Xarelto. Follow-up in 4-5 months, they know to call for any changes.    Thank you for allowing me to participate in his care.  Please do not hesitate to call for any questions or concerns.   Ronald DollyKaren Kostantinos Tallman, M.D.   CC: Gwinda PasseMichelle Edwards, NP

## 2019-06-15 NOTE — Patient Instructions (Signed)
1. Increase Keppra (Levetiracetam) 1000mg : Take 1 tablet twice a day  2. Keep a calendar of your seizures  3. Follow-up in 4 months, call for any changes  Seizure Precautions: 1. If medication has been prescribed for you to prevent seizures, take it exactly as directed.  Do not stop taking the medicine without talking to your doctor first, even if you have not had a seizure in a long time.   2. Avoid activities in which a seizure would cause danger to yourself or to others.  Don't operate dangerous machinery, swim alone, or climb in high or dangerous places, such as on ladders, roofs, or girders.  Do not drive unless your doctor says you may.  3. If you have any warning that you may have a seizure, lay down in a safe place where you can't hurt yourself.    4.  No driving for 6 months from last seizure, as per [facility].   Please refer to the following link on the [place] website for more information: http://www.epilepsyfoundation.org/answerplace/Social/driving/drivingu.cfm   5.  Maintain good sleep hygiene. Avoid any alcohol.  6.  Contact your doctor if you have any problems that may be related to the medicine you are taking.  7.  Call 911 and bring the patient back to the ED if:        A.  The seizure lasts longer than 5 minutes.       B.  The patient doesn't awaken shortly after the seizure  C.  The patient has new problems such as difficulty seeing, speaking or moving  D.  The patient was injured during the seizure  E.  The patient has a temperature over 102 F (39C)  F.  The patient vomited and now is having trouble breathing

## 2019-06-18 ENCOUNTER — Other Ambulatory Visit (INDEPENDENT_AMBULATORY_CARE_PROVIDER_SITE_OTHER): Payer: Self-pay | Admitting: Primary Care

## 2019-06-25 ENCOUNTER — Institutional Professional Consult (permissible substitution): Payer: Medicare Other | Admitting: Pulmonary Disease

## 2019-07-19 ENCOUNTER — Institutional Professional Consult (permissible substitution): Payer: Medicare Other | Admitting: Pulmonary Disease

## 2019-07-26 ENCOUNTER — Encounter (INDEPENDENT_AMBULATORY_CARE_PROVIDER_SITE_OTHER): Payer: Self-pay | Admitting: Primary Care

## 2019-07-26 ENCOUNTER — Other Ambulatory Visit: Payer: Self-pay

## 2019-07-26 ENCOUNTER — Ambulatory Visit (INDEPENDENT_AMBULATORY_CARE_PROVIDER_SITE_OTHER): Payer: Medicare Other | Admitting: Primary Care

## 2019-07-26 DIAGNOSIS — I10 Essential (primary) hypertension: Secondary | ICD-10-CM | POA: Diagnosis not present

## 2019-07-26 DIAGNOSIS — E785 Hyperlipidemia, unspecified: Secondary | ICD-10-CM

## 2019-07-26 DIAGNOSIS — R569 Unspecified convulsions: Secondary | ICD-10-CM | POA: Diagnosis not present

## 2019-07-26 DIAGNOSIS — I82401 Acute embolism and thrombosis of unspecified deep veins of right lower extremity: Secondary | ICD-10-CM | POA: Diagnosis not present

## 2019-07-26 MED ORDER — ATORVASTATIN CALCIUM 40 MG PO TABS: 40.0000 mg | ORAL_TABLET | Freq: Every day | ORAL | 6 refills | Status: DC

## 2019-07-26 NOTE — Progress Notes (Signed)
Patient is wanting xarelto. States he has been taking it. Has not been prescribed since 2019

## 2019-07-26 NOTE — Progress Notes (Signed)
Virtual Visit via Telephone Note  I connected with Ronald Forbes on 07/26/19 at  8:50 AM EST by telephone and verified that I am speaking with the correct person using two identifiers.   I discussed the limitations, risks, security and privacy concerns of performing an evaluation and management service by telephone and the availability of in person appointments. I also discussed with the patient that there may be a patient responsible charge related to this service. The patient expressed understanding and agreed to proceed.   History of Present Illness: Ronald Forbes sister is concern that he is out of Xarelto   Patient sister Ronald Forbes is on the phone to speak on this behalf. Denies any problems or concerns except medication refill.  Past Medical History:  Diagnosis Date  . Alcohol use disorder   . Cerebral vascular accident ([place])   . Cerebrovascular accident (CVA) due to thrombosis of left posterior cerebral artery ([place])   . Diabetes mellitus type 2 in obese ([place])   . DVT (deep venous thrombosis) ([place]) 01/25/2017  . HLD (hyperlipidemia)   . Morbid obesity ([place])   . PAD (peripheral artery disease) ([place]) 10/20/2016  . Pulmonary embolism ([place]) 07/11/2017  . Seizures ([place])   . Stroke ([place])   . Tobacco abuse      Current Outpatient Medications on File Prior to Visit  Medication Sig Dispense Refill  . atorvastatin (LIPITOR) 40 MG tablet Take 1 tablet (40 mg total) by mouth daily. 30 tablet 3  . levETIRAcetam (KEPPRA) 1000 MG tablet Take 1 tablet (1,000 mg total) by mouth 2 (two) times daily. 180 tablet 3   No current facility-administered medications on file prior to visit.   Observations/Objective: Review of Systems  All other systems reviewed and are negative.   Assessment and Plan: Ren[place] was seen today for medication refill.  Diagnoses and all orders for this visit:  Seizure ([place]) Followed by neurology previously seen and increase Levetiracetam to 1000mg  BID.   Benign  essential HTN Blood pressure goal is 130/80 continue to encourage a low-sodium diet, medication compliance, 150 minutes of moderate intensity exercise per week. Discussed medication compliance, adverse effects.  Acute deep vein thrombosis (DVT) of right lower extremity, unspecified vein ([place]) Stroke in 2017 with  Right sided  hemiparesis and seizures secondary to stroke. Continue control of vascular risk factors for secondary stroke prevention, he remains on Xarelto.   Hyperlipidemia, unspecified hyperlipidemia type Continue control of vascular risk factors for secondary stroke prevention with cholesterol lowering medication. Encourage to decrease  fatty foods examples are  cheese, milk and increase fiber like whole grains and veggies.   -     atorvastatin (LIPITOR) 40 MG tablet; Take 1 tablet (40 mg total) by mouth daily.    Follow Up Instructions:    I discussed the assessment and treatment plan with the patient. The patient was provided an opportunity to ask questions and all were answered. The patient agreed with the plan and demonstrated an understanding of the instructions.   The patient was advised to call back or seek an in-person evaluation if the symptoms worsen or if the condition fails to improve as anticipated.  I provided 22 minutes of non-face-to-face time during this encounter.   Ronald Perna, NP

## 2019-09-23 ENCOUNTER — Inpatient Hospital Stay (HOSPITAL_COMMUNITY)
Admission: EM | Disposition: A | Discharge status: Home / Self Care | Payer: Self-pay | Source: Home / Self Care | Attending: Cardiology

## 2019-09-23 ENCOUNTER — Other Ambulatory Visit: Payer: Self-pay

## 2019-09-23 ENCOUNTER — Inpatient Hospital Stay (HOSPITAL_COMMUNITY)
Admission: EM | Admit: 2019-09-23 | Discharge: 2019-09-25 | DRG: 247 | Disposition: A | Discharge status: Home / Self Care | Payer: Medicare Other | Attending: Cardiology | Admitting: Cardiology

## 2019-09-23 ENCOUNTER — Inpatient Hospital Stay (HOSPITAL_COMMUNITY): Payer: Medicare Other

## 2019-09-23 ENCOUNTER — Emergency Department (HOSPITAL_COMMUNITY): Payer: Medicare Other

## 2019-09-23 ENCOUNTER — Encounter (HOSPITAL_COMMUNITY): Payer: Self-pay

## 2019-09-23 DIAGNOSIS — I2510 Atherosclerotic heart disease of native coronary artery without angina pectoris: Secondary | ICD-10-CM | POA: Diagnosis not present

## 2019-09-23 DIAGNOSIS — I69351 Hemiplegia and hemiparesis following cerebral infarction affecting right dominant side: Secondary | ICD-10-CM

## 2019-09-23 DIAGNOSIS — I2129 ST elevation (STEMI) myocardial infarction involving other sites: Secondary | ICD-10-CM | POA: Diagnosis present

## 2019-09-23 DIAGNOSIS — E785 Hyperlipidemia, unspecified: Secondary | ICD-10-CM | POA: Diagnosis present

## 2019-09-23 DIAGNOSIS — I213 ST elevation (STEMI) myocardial infarction of unspecified site: Secondary | ICD-10-CM | POA: Diagnosis present

## 2019-09-23 DIAGNOSIS — R739 Hyperglycemia, unspecified: Secondary | ICD-10-CM | POA: Diagnosis not present

## 2019-09-23 DIAGNOSIS — G629 Polyneuropathy, unspecified: Secondary | ICD-10-CM | POA: Diagnosis not present

## 2019-09-23 DIAGNOSIS — G40909 Epilepsy, unspecified, not intractable, without status epilepticus: Secondary | ICD-10-CM | POA: Diagnosis present

## 2019-09-23 DIAGNOSIS — Z86718 Personal history of other venous thrombosis and embolism: Secondary | ICD-10-CM | POA: Diagnosis not present

## 2019-09-23 DIAGNOSIS — Z20822 Contact with and (suspected) exposure to covid-19: Secondary | ICD-10-CM | POA: Diagnosis present

## 2019-09-23 DIAGNOSIS — Z86711 Personal history of pulmonary embolism: Secondary | ICD-10-CM

## 2019-09-23 DIAGNOSIS — Z79899 Other long term (current) drug therapy: Secondary | ICD-10-CM | POA: Diagnosis not present

## 2019-09-23 DIAGNOSIS — I739 Peripheral vascular disease, unspecified: Secondary | ICD-10-CM | POA: Diagnosis present

## 2019-09-23 DIAGNOSIS — I10 Essential (primary) hypertension: Secondary | ICD-10-CM | POA: Diagnosis not present

## 2019-09-23 DIAGNOSIS — Z8249 Family history of ischemic heart disease and other diseases of the circulatory system: Secondary | ICD-10-CM | POA: Diagnosis not present

## 2019-09-23 DIAGNOSIS — T8189XA Other complications of procedures, not elsewhere classified, initial encounter: Secondary | ICD-10-CM | POA: Diagnosis not present

## 2019-09-23 DIAGNOSIS — F17210 Nicotine dependence, cigarettes, uncomplicated: Secondary | ICD-10-CM | POA: Diagnosis present

## 2019-09-23 DIAGNOSIS — Z89611 Acquired absence of right leg above knee: Secondary | ICD-10-CM | POA: Diagnosis not present

## 2019-09-23 DIAGNOSIS — I2119 ST elevation (STEMI) myocardial infarction involving other coronary artery of inferior wall: Secondary | ICD-10-CM | POA: Diagnosis present

## 2019-09-23 HISTORY — PX: LEFT HEART CATH AND CORONARY ANGIOGRAPHY: CATH118249

## 2019-09-23 HISTORY — PX: CORONARY/GRAFT ACUTE MI REVASCULARIZATION: CATH118305

## 2019-09-23 LAB — TROPONIN I (HIGH SENSITIVITY)
Troponin I (High Sensitivity): 1003 ng/L (ref <18)
Troponin I (High Sensitivity): 851 ng/L (ref <18)
Troponin I (High Sensitivity): >27000 ng/L (ref <18)

## 2019-09-23 LAB — COMPREHENSIVE METABOLIC PANEL
ALT: 46 U/L — ABNORMAL HIGH (ref 0–44)
ALT: 40 U/L (ref 0–44)
AST: 37 U/L (ref 15–41)
AST: 32 U/L (ref 15–41)
Albumin: 4 g/dL (ref 3.5–5.0)
Albumin: 3.5 g/dL (ref 3.5–5.0)
Alkaline Phosphatase: 89 U/L (ref 38–126)
Alkaline Phosphatase: 78 U/L (ref 38–126)
Anion gap: 11 (ref 5–15)
Anion gap: 12 (ref 5–15)
BUN: 8 mg/dL (ref 6–20)
BUN: 7 mg/dL (ref 6–20)
CO2: 24 mmol/L (ref 22–32)
CO2: 23 mmol/L (ref 22–32)
Calcium: 8.9 mg/dL (ref 8.9–10.3)
Calcium: 8.6 mg/dL — ABNORMAL LOW (ref 8.9–10.3)
Chloride: 108 mmol/L (ref 98–111)
Chloride: 106 mmol/L (ref 98–111)
Creatinine, Ser: 0.92 mg/dL (ref 0.61–1.24)
Creatinine, Ser: 0.94 mg/dL (ref 0.61–1.24)
GFR calc Af Amer: >60 mL/min (ref >60)
GFR calc Af Amer: >60 mL/min (ref >60)
GFR calc non Af Amer: >60 mL/min (ref >60)
GFR calc non Af Amer: >60 mL/min (ref >60)
Glucose, Bld: 133 mg/dL — ABNORMAL HIGH (ref 70–99)
Glucose, Bld: 203 mg/dL — ABNORMAL HIGH (ref 70–99)
Potassium: 4.3 mmol/L (ref 3.5–5.1)
Potassium: 3.6 mmol/L (ref 3.5–5.1)
Sodium: 143 mmol/L (ref 135–145)
Sodium: 141 mmol/L (ref 135–145)
Total Bilirubin: 1 mg/dL (ref 0.3–1.2)
Total Bilirubin: 0.5 mg/dL (ref 0.3–1.2)
Total Protein: 7.2 g/dL (ref 6.5–8.1)
Total Protein: 6.4 g/dL — ABNORMAL LOW (ref 6.5–8.1)

## 2019-09-23 LAB — CBC
HCT: 43.4 % (ref 39.0–52.0)
Hemoglobin: 15.2 g/dL (ref 13.0–17.0)
MCH: 31.7 pg (ref 26.0–34.0)
MCHC: 35 g/dL (ref 30.0–36.0)
MCV: 90.6 fL (ref 80.0–100.0)
Platelets: 127 10*3/uL — ABNORMAL LOW (ref 150–400)
RBC: 4.79 MIL/uL (ref 4.22–5.81)
RDW: 14 % (ref 11.5–15.5)
WBC: 8.2 10*3/uL (ref 4.0–10.5)
nRBC: 0 % (ref 0.0–0.2)

## 2019-09-23 LAB — HEMOGLOBIN A1C
Hgb A1c MFr Bld: 5.8 % — ABNORMAL HIGH (ref 4.8–5.6)
Hgb A1c MFr Bld: 5.9 % — ABNORMAL HIGH (ref 4.8–5.6)
Mean Plasma Glucose: 119.76 mg/dL
Mean Plasma Glucose: 122.63 mg/dL

## 2019-09-23 LAB — RESPIRATORY PANEL BY RT PCR (FLU A&B, COVID)
Influenza A by PCR: NEGATIVE
Influenza B by PCR: NEGATIVE
SARS Coronavirus 2 by RT PCR: NEGATIVE

## 2019-09-23 LAB — LIPID PANEL
Cholesterol: 136 mg/dL (ref 0–200)
Cholesterol: 117 mg/dL (ref 0–200)
HDL: 43 mg/dL (ref >40)
HDL: 38 mg/dL — ABNORMAL LOW (ref >40)
LDL Cholesterol: 69 mg/dL (ref 0–99)
LDL Cholesterol: 66 mg/dL (ref 0–99)
Total CHOL/HDL Ratio: 3.2 RATIO
Total CHOL/HDL Ratio: 3.1 RATIO
Triglycerides: 121 mg/dL (ref <150)
Triglycerides: 64 mg/dL (ref <150)
VLDL: 24 mg/dL (ref 0–40)
VLDL: 13 mg/dL (ref 0–40)

## 2019-09-23 LAB — GLUCOSE, CAPILLARY
Glucose-Capillary: 142 mg/dL — ABNORMAL HIGH (ref 70–99)
Glucose-Capillary: 118 mg/dL — ABNORMAL HIGH (ref 70–99)

## 2019-09-23 LAB — CBC WITH DIFFERENTIAL/PLATELET
Abs Immature Granulocytes: 0.04 10*3/uL (ref 0.00–0.07)
Basophils Absolute: 0 10*3/uL (ref 0.0–0.1)
Basophils Relative: 0 %
Eosinophils Absolute: 0 10*3/uL (ref 0.0–0.5)
Eosinophils Relative: 0 %
HCT: 49.3 % (ref 39.0–52.0)
Hemoglobin: 16.5 g/dL (ref 13.0–17.0)
Immature Granulocytes: 0 %
Lymphocytes Relative: 30 %
Lymphs Abs: 2.9 10*3/uL (ref 0.7–4.0)
MCH: 31.2 pg (ref 26.0–34.0)
MCHC: 33.5 g/dL (ref 30.0–36.0)
MCV: 93.2 fL (ref 80.0–100.0)
Monocytes Absolute: 0.5 10*3/uL (ref 0.1–1.0)
Monocytes Relative: 6 %
Neutro Abs: 6.2 10*3/uL (ref 1.7–7.7)
Neutrophils Relative %: 64 %
Platelets: 127 10*3/uL — ABNORMAL LOW (ref 150–400)
RBC: 5.29 MIL/uL (ref 4.22–5.81)
RDW: 14.3 % (ref 11.5–15.5)
WBC: 9.7 10*3/uL (ref 4.0–10.5)
nRBC: 0 % (ref 0.0–0.2)

## 2019-09-23 LAB — ECHOCARDIOGRAM COMPLETE
Height: 75 in
Weight: 3680 oz

## 2019-09-23 LAB — PROTIME-INR
INR: 1 (ref 0.8–1.2)
INR: 1.1 (ref 0.8–1.2)
Prothrombin Time: 13.2 seconds (ref 11.4–15.2)
Prothrombin Time: 14 seconds (ref 11.4–15.2)

## 2019-09-23 LAB — APTT
aPTT: 30 seconds (ref 24–36)
aPTT: 145 seconds — ABNORMAL HIGH (ref 24–36)

## 2019-09-23 LAB — MRSA PCR SCREENING: MRSA by PCR: NEGATIVE

## 2019-09-23 SURGERY — CORONARY/GRAFT ACUTE MI REVASCULARIZATION
Anesthesia: LOCAL

## 2019-09-23 MED ORDER — NITROGLYCERIN 1 MG/10 ML FOR IR/CATH LAB
INTRA_ARTERIAL | Status: AC
  Filled 2019-09-23: qty 10

## 2019-09-23 MED ORDER — VERAPAMIL HCL 2.5 MG/ML IV SOLN
INTRAVENOUS | Status: DC | PRN
  Administered 2019-09-23: 6 mL via INTRA_ARTERIAL

## 2019-09-23 MED ORDER — SODIUM CHLORIDE 0.9 % IV SOLN: INTRAVENOUS | Status: DC

## 2019-09-23 MED ORDER — HEPARIN (PORCINE) IN NACL 1000-0.9 UT/500ML-% IV SOLN
INTRAVENOUS | Status: AC
  Filled 2019-09-23: qty 1000

## 2019-09-23 MED ORDER — TICAGRELOR 90 MG PO TABS
ORAL_TABLET | ORAL | Status: DC | PRN
  Administered 2019-09-23: 180 mg via ORAL

## 2019-09-23 MED ORDER — NITROGLYCERIN IN D5W 200-5 MCG/ML-% IV SOLN: 0.0000 ug/min | INTRAVENOUS | Status: DC

## 2019-09-23 MED ORDER — LEVETIRACETAM 250 MG PO TABS
1000.0000 mg | ORAL_TABLET | Freq: Two times a day (BID) | ORAL | Status: DC
  Administered 2019-09-23 – 2019-09-25 (×4): 1000 mg via ORAL
  Filled 2019-09-23 (×4): qty 4

## 2019-09-23 MED ORDER — SODIUM CHLORIDE 0.9 % IV SOLN
INTRAVENOUS | Status: AC | PRN
  Administered 2019-09-23: 100 mL/h via INTRAVENOUS

## 2019-09-23 MED ORDER — SODIUM CHLORIDE 0.9% FLUSH: 3.0000 mL | INTRAVENOUS | Status: DC | PRN

## 2019-09-23 MED ORDER — HEPARIN SODIUM (PORCINE) 1000 UNIT/ML IJ SOLN
INTRAMUSCULAR | Status: DC | PRN
  Administered 2019-09-23: 2000 [IU] via INTRAVENOUS
  Administered 2019-09-23: 8000 [IU] via INTRAVENOUS

## 2019-09-23 MED ORDER — TIROFIBAN HCL IN NACL 5-0.9 MG/100ML-% IV SOLN
INTRAVENOUS | Status: AC
  Filled 2019-09-23: qty 100

## 2019-09-23 MED ORDER — TIROFIBAN HCL IN NACL 5-0.9 MG/100ML-% IV SOLN
INTRAVENOUS | Status: DC | PRN
  Administered 2019-09-23: 0.15 ug/kg/min via INTRAVENOUS

## 2019-09-23 MED ORDER — SODIUM CHLORIDE 0.9 % IV SOLN: 250.0000 mL | INTRAVENOUS | Status: DC | PRN

## 2019-09-23 MED ORDER — TIROFIBAN (AGGRASTAT) BOLUS VIA INFUSION
INTRAVENOUS | Status: DC | PRN
  Administered 2019-09-23: 2607.5 ug via INTRAVENOUS

## 2019-09-23 MED ORDER — LIDOCAINE HCL (PF) 1 % IJ SOLN
INTRAMUSCULAR | Status: DC | PRN
  Administered 2019-09-23: 2 mL

## 2019-09-23 MED ORDER — ZOLPIDEM TARTRATE 5 MG PO TABS
10.0000 mg | ORAL_TABLET | Freq: Every day | ORAL | Status: DC
  Administered 2019-09-23 – 2019-09-24 (×2): 10 mg via ORAL
  Filled 2019-09-23 (×2): qty 2

## 2019-09-23 MED ORDER — SODIUM CHLORIDE 0.9 % WEIGHT BASED INFUSION
1.0000 mL/kg/h | INTRAVENOUS | Status: AC
  Administered 2019-09-23 (×2): 1 mL/kg/h via INTRAVENOUS

## 2019-09-23 MED ORDER — HEPARIN (PORCINE) IN NACL 1000-0.9 UT/500ML-% IV SOLN
INTRAVENOUS | Status: DC | PRN
  Administered 2019-09-23 (×3): 500 mL

## 2019-09-23 MED ORDER — ALPRAZOLAM 0.5 MG PO TABS
0.5000 mg | ORAL_TABLET | Freq: Three times a day (TID) | ORAL | Status: DC | PRN
  Administered 2019-09-23: 0.5 mg via ORAL
  Filled 2019-09-23 (×2): qty 1

## 2019-09-23 MED ORDER — ASPIRIN 81 MG PO CHEW
81.0000 mg | CHEWABLE_TABLET | Freq: Every day | ORAL | Status: DC
  Administered 2019-09-24 – 2019-09-25 (×2): 81 mg via ORAL
  Filled 2019-09-23 (×2): qty 1

## 2019-09-23 MED ORDER — TRAMADOL HCL 50 MG PO TABS
50.0000 mg | ORAL_TABLET | Freq: Three times a day (TID) | ORAL | Status: DC | PRN
  Administered 2019-09-23: 50 mg via ORAL
  Filled 2019-09-23: qty 1

## 2019-09-23 MED ORDER — TICAGRELOR 90 MG PO TABS
ORAL_TABLET | ORAL | Status: AC
  Filled 2019-09-23: qty 2

## 2019-09-23 MED ORDER — ONDANSETRON HCL 4 MG/2ML IJ SOLN: 4.0000 mg | Freq: Four times a day (QID) | INTRAMUSCULAR | Status: DC | PRN

## 2019-09-23 MED ORDER — VERAPAMIL HCL 2.5 MG/ML IV SOLN
INTRAVENOUS | Status: AC
  Filled 2019-09-23: qty 2

## 2019-09-23 MED ORDER — TICAGRELOR 90 MG PO TABS
90.0000 mg | ORAL_TABLET | Freq: Two times a day (BID) | ORAL | Status: DC
  Administered 2019-09-23 – 2019-09-25 (×4): 90 mg via ORAL
  Filled 2019-09-23 (×4): qty 1

## 2019-09-23 MED ORDER — IOHEXOL 350 MG/ML SOLN
INTRAVENOUS | Status: AC
  Filled 2019-09-23: qty 1

## 2019-09-23 MED ORDER — FENTANYL CITRATE (PF) 100 MCG/2ML IJ SOLN
INTRAMUSCULAR | Status: AC
  Filled 2019-09-23: qty 2

## 2019-09-23 MED ORDER — ATORVASTATIN CALCIUM 40 MG PO TABS
40.0000 mg | ORAL_TABLET | Freq: Every day | ORAL | Status: DC
  Administered 2019-09-24: 40 mg via ORAL
  Filled 2019-09-23: qty 1

## 2019-09-23 MED ORDER — HYDRALAZINE HCL 20 MG/ML IJ SOLN: 5.0000 mg | INTRAMUSCULAR | Status: AC | PRN

## 2019-09-23 MED ORDER — FENTANYL CITRATE (PF) 100 MCG/2ML IJ SOLN
INTRAMUSCULAR | Status: DC | PRN
  Administered 2019-09-23: 50 ug via INTRAVENOUS

## 2019-09-23 MED ORDER — HEPARIN (PORCINE) IN NACL 1000-0.9 UT/500ML-% IV SOLN
INTRAVENOUS | Status: AC
  Filled 2019-09-23: qty 500

## 2019-09-23 MED ORDER — ACETAMINOPHEN 325 MG PO TABS: 650.0000 mg | ORAL_TABLET | ORAL | Status: DC | PRN

## 2019-09-23 MED ORDER — HEPARIN SODIUM (PORCINE) 1000 UNIT/ML IJ SOLN
INTRAMUSCULAR | Status: AC
  Filled 2019-09-23: qty 1

## 2019-09-23 MED ORDER — SODIUM CHLORIDE 0.9% FLUSH
3.0000 mL | Freq: Two times a day (BID) | INTRAVENOUS | Status: DC
  Administered 2019-09-24: 3 mL via INTRAVENOUS

## 2019-09-23 MED ORDER — LIDOCAINE HCL (PF) 1 % IJ SOLN
INTRAMUSCULAR | Status: AC
  Filled 2019-09-23: qty 30

## 2019-09-23 MED ORDER — HEPARIN SODIUM (PORCINE) 5000 UNIT/ML IJ SOLN
4000.0000 [IU] | Freq: Once | INTRAMUSCULAR | Status: AC
  Administered 2019-09-23: 4000 [IU] via INTRAVENOUS

## 2019-09-23 MED ORDER — IOHEXOL 350 MG/ML SOLN
INTRAVENOUS | Status: DC | PRN
  Administered 2019-09-23: 175 mL via INTRA_ARTERIAL

## 2019-09-23 SURGICAL SUPPLY — 22 items
BALLN SAPPHIRE 1.25X10 (BALLOONS) ×2
BALLN SAPPHIRE 2.0X12 (BALLOONS) ×2
BALLN SAPPHIRE 3.0X15 (BALLOONS) ×2
BALLN SAPPHIRE ~~LOC~~ 3.0X12 (BALLOONS) ×2 IMPLANT
BALLOON SAPPHIRE 1.25X10 (BALLOONS) ×1 IMPLANT
BALLOON SAPPHIRE 2.0X12 (BALLOONS) ×1 IMPLANT
BALLOON SAPPHIRE 3.0X15 (BALLOONS) ×1 IMPLANT
CATH EXTRAC PRONTO 5.5F 138CM (CATHETERS) ×2 IMPLANT
CATH OPTITORQUE TIG 4.0 5F (CATHETERS) ×2 IMPLANT
CATH VISTA GUIDE 6FR XB3.5 (CATHETERS) ×2 IMPLANT
DEVICE RAD COMP TR BAND LRG (VASCULAR PRODUCTS) ×2 IMPLANT
GLIDESHEATH SLEND A-KIT 6F 22G (SHEATH) ×2 IMPLANT
GUIDELINER 6F (CATHETERS) ×2 IMPLANT
GUIDEWIRE INQWIRE 1.5J.035X260 (WIRE) ×1 IMPLANT
INQWIRE 1.5J .035X260CM (WIRE) ×2
KIT ENCORE 26 ADVANTAGE (KITS) ×2 IMPLANT
KIT HEART LEFT (KITS) ×2 IMPLANT
PACK CARDIAC CATHETERIZATION (CUSTOM PROCEDURE TRAY) ×2 IMPLANT
STENT RESOLUTE ONYX 3.0X18 (Permanent Stent) ×4 IMPLANT
TRANSDUCER W/STOPCOCK (MISCELLANEOUS) ×2 IMPLANT
TUBING CIL FLEX 10 FLL-RA (TUBING) ×2 IMPLANT
WIRE COUGAR XT STRL 190CM (WIRE) ×4 IMPLANT

## 2019-09-23 NOTE — Care Management (Signed)
Brilinta covered on Medicaid $3.90/ month.

## 2019-09-23 NOTE — Progress Notes (Signed)
  Echocardiogram 2D Echocardiogram has been performed.  Gerda Diss 09/23/2019, 4:24 PM

## 2019-09-23 NOTE — ED Provider Notes (Signed)
[facility] EMERGENCY DEPARTMENT Provider Note   CSN: [id number] Arrival date & time: 09/23/19  7616     History Chief Complaint  Patient presents with  . Code STEMI    Kruze Atchley is a 59 y.o. male.  The history is provided by the patient, medical records and the EMS personnel. No language interpreter was used.   Jereme Loren is a 59 y.o. male who presents to the Emergency Department complaining of chest pain.  He presents by EMS complaining of sharp central chest pain that began last night and then resolved only to recur again this morning. EMS activated code STEMI prior to ED arrival. He was treated with aspirin, sublingual nitroglycerin and morphine with partial improvement in his symptoms. No prior similar symptoms. He denies any associated symptoms. He does smoke cigarettes and drink alcohol. Denies any street drugs. He has a history of seizure disorder, denies any additional medical problems.    Past Medical History:  Diagnosis Date  . Alcohol use disorder   . Cerebral vascular accident (HCC)   . 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 Diabetes mellitus type 2 in obese (HCC)   . DVT (deep venous thrombosis) (HCC) 01/25/2017  . HLD (hyperlipidemia)   . Morbid obesity (HCC)   . PAD (peripheral artery disease) (HCC) 10/20/2016  . Pulmonary embolism (HCC) 07/11/2017  . Seizures (HCC)   . Stroke (HCC)   . Tobacco abuse     Patient Active Problem List   Diagnosis Date Noted  . Impacted cerumen of both ears 02/15/2018  . 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 Pressure injury of skin 07/12/2017  . Acute pulmonary embolism (HCC) 07/11/2017  . Alcohol abuse 07/11/2017  . Pulmonary embolism (HCC) 07/11/2017  . Seizure (HCC) 01/25/2017  . Depression 01/25/2017  . DVT (deep venous thrombosis) (HCC) 01/25/2017  . Seizures (HCC) 01/25/2017  . Hemiparesis affecting right side as late  effect of cerebrovascular accident (CVA) (HCC) 12/07/2016  . Type 2 diabetes mellitus with diabetic peripheral angiopathy without gangrene, without long-term current use of insulin (HCC)   . 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 History of CVA with residual deficit   . Phantom limb pain (HCC)   . 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 Unilateral AKA, right (HCC)   . Diabetic peripheral neuropathy (HCC)   . Diabetes mellitus type 2 in nonobese (HCC)   . Reactive hypertension   . Type 2 diabetes mellitus with peripheral neuropathy (HCC)   . Benign essential HTN   . Hypokalemia   . Hypoalbuminemia due to protein-calorie malnutrition (HCC)   . Acute blood loss anemia   . Debilitated 10/28/2016  . Ischemia of right lower extremity 10/25/2016  . PAD (peripheral artery disease) (HCC) 10/20/2016  . Thyroid nodule 02/29/2016  . 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 Cerebrovascular accident (CVA) due to embolism of left posterior cerebral artery (HCC)   . Diabetes mellitus type 2 in obese (HCC) 02/07/2016  . HLD (hyperlipidemia)   . Tobacco abuse 02/04/2016  . Alcohol use disorder 02/04/2016  . Morbid obesity (HCC) 02/04/2016  . CVA (cerebral vascular accident) (HCC) 02/04/2016    Past Surgical History:  Procedure Laterality Date  . ABDOMINAL AORTOGRAM W/LOWER EXTREMITY Right 10/20/2016   Procedure: Abdominal Aortogram w/Lower Extremity;  Surgeon: Maeola Harman, MD;  Location: [facility] INVASIVE CV LAB;  Service:  Cardiovascular;  Laterality: Right;  lower leg  . AMPUTATION Right 11/15/2016   Procedure: AMPUTATION ABOVE KNEE;  Surgeon: Sherren Kerns, MD;  Location: [facility] OR;  Service: Vascular;  Laterality: Right;  . EP IMPLANTABLE DEVICE N/A 02/09/2016   Procedure: Loop Recorder Insertion;  Surgeon: Duke Salvia, MD;  Location: [facility] INVASIVE CV LAB;  Service: Cardiovascular;  Laterality: N/A;  . FEMORAL-TIBIAL BYPASS GRAFT Right  10/25/2016   Procedure: BYPASS GRAFT FEMORAL-TIBIAL ARTERY USING NON REVERSED SAPPHENOUS VEIN;  Surgeon: Sherren Kerns, MD;  Location: [facility] OR;  Service: Vascular;  Laterality: Right;  . INTRAOPERATIVE ARTERIOGRAM Right 10/25/2016   Procedure: INTRA OPERATIVE ARTERIOGRAM;  Surgeon: Sherren Kerns, MD;  Location: [facility] OR;  Service: Vascular;  Laterality: Right;  . TEE WITHOUT CARDIOVERSION N/A 02/06/2016   Procedure: TRANSESOPHAGEAL ECHOCARDIOGRAM (TEE);  Surgeon: Wendall Stade, MD;  Location: [facility] ENDOSCOPY;  Service: Cardiovascular;  Laterality: N/A;       Family History  Problem Relation Age of Onset  . Hypertension Mother     Social History   Tobacco Use  . Smoking status: Current Every Day Smoker    Packs/day: 0.25    Years: 40.00    Pack years: 10.00    Types: Cigarettes  . Smokeless tobacco: Never Used  . Tobacco comment: Less than 1 pk per day  Substance Use Topics  . Alcohol use: No    Alcohol/week: 0.0 standard drinks    Comment: 6 pack/day (past)  . Drug use: Yes    Frequency: 1.0 times per week    Types: Marijuana    Comment: occasional marijuana    Home Medications Prior to Admission medications   Medication Sig Start Date End Date Taking? Authorizing Provider  atorvastatin (LIPITOR) 40 MG tablet Take 1 tablet (40 mg total) by mouth daily. 07/26/19   Grayce Sessions, NP  levETIRAcetam (KEPPRA) 1000 MG tablet Take 1 tablet (1,000 mg total) by mouth 2 (two) times daily. 06/15/19   Van Clines, MD    Allergies    Patient has no known allergies.  Review of Systems   Review of Systems  All other systems reviewed and are negative.   Physical Exam Updated Vital Signs Ht 6\' 3"  (1.905 m)   Wt 104.3 kg   BMI 28.75 kg/m   Physical Exam Vitals and nursing note reviewed.  Constitutional:      Appearance: He is well-developed.  HENT:     Head: Normocephalic and atraumatic.  Cardiovascular:     Rate and Rhythm: Normal rate and regular rhythm.      Heart sounds: No murmur.  Pulmonary:     Effort: Pulmonary effort is normal. No respiratory distress.     Breath sounds: Normal breath sounds.  Abdominal:     Palpations: Abdomen is soft.     Tenderness: There is no abdominal tenderness. There is no guarding or rebound.  Musculoskeletal:        General: No tenderness.     Comments: Right AKA. 2+ femoral pulses bilaterally.  Skin:    General: Skin is warm and dry.  Neurological:     Mental Status: He is alert and oriented to person, place, and time.  Psychiatric:        Behavior: Behavior normal.     ED Results / Procedures / Treatments   Labs (all labs ordered are listed, but only abnormal results are displayed) Labs Reviewed  RESPIRATORY PANEL BY RT PCR (FLU A&B, COVID)  HEMOGLOBIN A1C  CBC WITH DIFFERENTIAL/PLATELET  PROTIME-INR  APTT  COMPREHENSIVE METABOLIC PANEL  LIPID PANEL  TROPONIN I (HIGH SENSITIVITY)    EKG EKG Interpretation  Date/Time:  Sunday September 23 2019 09:18:36 EST Ventricular Rate:  64 PR Interval:    QRS Duration: 112 QT Interval:  397 QTC Calculation: 410 R Axis:   88 Text Interpretation: Sinus rhythm Inferoposterior infarct, acute (RCA) Anterolateral infarct, acute Probable RV involvement, suggest recording right precordial leads >>> Acute MI <<< Confirmed by Quintella Reichert [phone]) on 09/23/2019 9:22:48 AM   Radiology CARDIAC CATHETERIZATION  Result Date: 09/23/2019 Left Heart Catheterization 09/23/19: LV: Mild LV dysfunction, EF 45 to 50% with inferior and inferolateral hypokinesis, EDP normal. RCA is large and has mild luminal irregularity. Left main is large and is normal. LAD is a large-caliber vessel giving origin to large D1, mild luminal irregularity to moderate disease is evident in the midsegment. Circumflex: Very large vessel.  Mid segment is occluded.  SP thrombectomy followed by stenting with 3.0 x 18 mm resolute Onyx DES.  The guidewire was sheared at the distal edge of the stent and  had to be trapped with the second stent, 3.0 x 18 mm resolute DES. Recommendation: Patient will need dual antiplatelet therapy for at least 1 year.  Do not expect any long-term complications from the sheared wire.  We will watch him closely.  175 mL contrast utilized.  DG Chest Port 1 View  Result Date: 09/23/2019 CLINICAL DATA:  Chest pain. EXAM: PORTABLE CHEST 1 VIEW COMPARISON:  October 26, 2018. FINDINGS: The heart size and mediastinal contours are within normal limits. Both lungs are clear. No pneumothorax or pleural effusion is noted. The visualized skeletal structures are unremarkable. IMPRESSION: No active disease. Electronically Signed   By: Marijo Conception M.D.   On: 09/23/2019 09:31    Procedures Procedures (including critical care time) CRITICAL CARE Performed by: Quintella Reichert   Total critical care time: 35 minutes  Critical care time was exclusive of separately billable procedures and treating other patients.  Critical care was necessary to treat or prevent imminent or life-threatening deterioration.  Critical care was time spent personally by me on the following activities: development of treatment plan with patient and/or surrogate as well as nursing, discussions with consultants, evaluation of patient's response to treatment, examination of patient, obtaining history from patient or surrogate, ordering and performing treatments and interventions, ordering and review of laboratory studies, ordering and review of radiographic studies, pulse oximetry and re-evaluation of patient's condition.  Medications Ordered in ED Medications  0.9 %  sodium chloride infusion (has no administration in time range)    ED Course  I have reviewed the triage vital signs and the nursing notes.  Pertinent labs & imaging results that were available during my care of the patient were reviewed by me and considered in my medical decision making (see chart for details).    MDM Rules/Calculators/A&P                       Patient here for evaluation of chest pain. STEMI alert activated prior to patient's ED arrival. He was treated with heparin bolus, nitro drip for ongoing chest pain. Plan to admit to cardiology service for emergent heart cath and further management. Patient is in agreement with treatment plan.  Final Clinical Impression(s) / ED Diagnoses Final diagnoses:  Acute ST elevation myocardial infarction (STEMI), unspecified artery ([place])    Rx / DC Orders ED Discharge Orders  None       Tilden Fossa, MD 09/23/19 [phone]

## 2019-09-23 NOTE — Progress Notes (Signed)
I discussed the findings of the coronary angiogram with the patient and made him aware of the need for a second stent in view of shearing of the guidewire.  I also spoke to his Dallie Dad and explained to her that it is probably of no consequence and the procedure went well but need for second stent was discussed in detail.  She was pleased with the explanation.  [name], MD, [facility] 09/23/2019, 12:27 PM [facility] [place] Office: [phone]

## 2019-09-23 NOTE — H&P (Signed)
CARDIOLOGY ADMIT NOTE   Patient ID: Ronald Forbes MRN: [id number] DOB/AGE: [birth year] 59 y.o.  Admit date: 09/23/2019 Primary Physician:  Grayce Sessions, NP  Patient ID: Ronald Forbes, male    DOB: 12-04-1960, 59 y.o.   MRN: [id number]  Chief Complaint  Patient presents with  . Code STEMI   HPI:    [name]  is a 59 y.o. 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  Patient then presented with DVT and PE in May 2018 and was started on Xarelto.  Patient has discontinued this in the interim, past medical history significant for hypertension, seizure disorder following stroke in 2017, last episode 6 months ago, hyperglycemia, tobacco use disorder.  He presented this morning with chest pain that started last evening, subsided but again this morning had severe chest pain and his sister activated EMS, in the emergency room found to have inferolateral myocardial infarction with STEMI.  Patient emergently will be taken to the cardiac catheterization lab.  Still having active chest pain in the emergency room.  Past Medical History:  Diagnosis Date  . Alcohol use disorder   . Cerebral vascular accident (HCC)   . 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 Diabetes mellitus type 2 in obese (HCC)   . DVT (deep venous thrombosis) (HCC) 01/25/2017  . HLD (hyperlipidemia)   . Morbid obesity (HCC)   . PAD (peripheral artery disease) (HCC) 10/20/2016  . Pulmonary embolism (HCC) 07/11/2017  . Seizures (HCC)   . Stroke (HCC)   . Tobacco abuse    Past Surgical History:  Procedure  Laterality Date  . ABDOMINAL AORTOGRAM W/LOWER EXTREMITY Right 10/20/2016   Procedure: Abdominal Aortogram w/Lower Extremity;  Surgeon: Maeola Harman, MD;  Location: [facility] INVASIVE CV LAB;  Service: Cardiovascular;  Laterality: Right;  lower leg  . AMPUTATION Right 11/15/2016   Procedure: AMPUTATION ABOVE KNEE;  Surgeon: Sherren Kerns, MD;  Location: [facility] OR;  Service: Vascular;  Laterality: Right;  . EP IMPLANTABLE DEVICE N/A 02/09/2016   Procedure: Loop Recorder Insertion;  Surgeon: Duke Salvia, MD;  Location: [facility] INVASIVE CV LAB;  Service: Cardiovascular;  Laterality: N/A;  . FEMORAL-TIBIAL BYPASS GRAFT Right 10/25/2016   Procedure: BYPASS GRAFT FEMORAL-TIBIAL ARTERY USING NON REVERSED SAPPHENOUS VEIN;  Surgeon: Sherren Kerns, MD;  Location: [facility] OR;  Service: Vascular;  Laterality: Right;  . INTRAOPERATIVE ARTERIOGRAM Right 10/25/2016   Procedure: INTRA OPERATIVE ARTERIOGRAM;  Surgeon: Sherren Kerns, MD;  Location: [facility] OR;  Service: Vascular;  Laterality: Right;  . TEE WITHOUT CARDIOVERSION N/A 02/06/2016   Procedure: TRANSESOPHAGEAL ECHOCARDIOGRAM (TEE);  Surgeon: Wendall Stade, MD;  Location: [facility] ENDOSCOPY;  Service: Cardiovascular;  Laterality: N/A;   Social History   Socioeconomic History  . Marital status: Single    Spouse name: Not on file  . Number of children: Not on file  . Years of education: Not on file  . Highest education level: Not on file  Occupational History  . Occupation: Curator  Tobacco Use  . Smoking status: Current Every Day Smoker    Packs/day: 0.25    Years: 40.00  Pack years: 10.00    Types: Cigarettes  . Smokeless tobacco: Never Used  . Tobacco comment: Less than 1 pk per day  Substance and Sexual Activity  . Alcohol use: No    Alcohol/week: 0.0 standard drinks    Comment: 6 pack/day (past)  . Drug use: Yes    Frequency: 1.0 times per week    Types: Marijuana    Comment: occasional marijuana  . Sexual activity: Not Currently  Other  Topics Concern  . Not on file  Social History Narrative   Lives alone   Used to work as a Air cabin crew bound.   Right leg amputated   Social Determinants of Health   Financial Resource Strain:   . Difficulty of Paying Living Expenses: Not on file  Food Insecurity:   . Worried About Programme researcher, broadcasting/film/video in the Last Year: Not on file  . Ran Out of Food in the Last Year: Not on file  Transportation Needs:   . Lack of Transportation (Medical): Not on file  . Lack of Transportation (Non-Medical): Not on file  Physical Activity:   . Days of Exercise per Week: Not on file  . Minutes of Exercise per Session: Not on file  Stress:   . Feeling of Stress : Not on file  Social Connections:   . Frequency of Communication with Friends and Family: Not on file  . Frequency of Social Gatherings with Friends and Family: Not on file  . Attends Religious Services: Not on file  . Active Member of Clubs or Organizations: Not on file  . Attends Banker Meetings: Not on file  . Marital Status: Not on file  Intimate Partner Violence:   . Fear of Current or Ex-Partner: Not on file  . Emotionally Abused: Not on file  . Physically Abused: Not on file  . Sexually Abused: Not on file   ROS  Review of Systems  Cardiovascular: Positive for chest pain. Negative for dyspnea on exertion and leg swelling.  Respiratory: Positive for cough (chronic).   Gastrointestinal: Negative for melena.  All other systems reviewed and are negative.  Objective   Vitals with BMI 09/23/2019 09/23/2019 09/23/2019  Height - - -  Weight - - -  BMI - - -  Systolic 147 141 315  Diastolic 91 90 92  Pulse 61 69 64      Physical Exam  Constitutional: He is oriented to person, place, and time.  He is well-built and mildly obese in no acute distress.  Neck: No thyromegaly present.  Cardiovascular: Normal rate, regular rhythm, normal heart sounds and intact distal pulses. Exam reveals no gallop.  No murmur  heard. Pulses:      Carotid pulses are 2+ on the right side and 2+ on the left side.      Femoral pulses are 2+ on the left side.      Right popliteal pulse not accessible.       Dorsalis pedis pulses are 2+ on the left side. Right dorsalis pedis pulse not accessible.       Posterior tibial pulses are 1+ on the left side. Right posterior tibial pulse not accessible.  No leg edema, no JVD. Right AKA stump noted  Pulmonary/Chest: Effort normal and breath sounds normal.  Abdominal: Soft. Bowel sounds are normal.  Musculoskeletal:        General: No edema. Normal range of motion.     Cervical back: Neck supple.  Neurological: He is  alert and oriented to person, place, and time.  Skin: Skin is warm and dry.  Psychiatric: He has a normal mood and affect.   Laboratory examination:   Recent Labs    02/23/19 1013 09/23/19 0919 09/23/19 1012  NA 139 143 141  K 4.0 4.3 3.6  CL 105 108 106  CO2 19* 24 23  GLUCOSE 133* 133* 203*  BUN 11 8 7   CREATININE 0.78 0.92 0.94  CALCIUM 9.1 8.9 8.6*  GFRNONAA 100 >60 >60  GFRAA 116 >60 >60   estimated creatinine clearance is 113.3 mL/min (by C-G formula based on SCr of 0.94 mg/dL).  CMP Latest Ref Rng & Units 09/23/2019 09/23/2019 02/23/2019  Glucose 70 - 99 mg/dL 203(H) 133(H) 133(H)  BUN 6 - 20 mg/dL 7 8 11   Creatinine 0.61 - 1.24 mg/dL 0.94 0.92 0.78  Sodium 135 - 145 mmol/L 141 143 139  Potassium 3.5 - 5.1 mmol/L 3.6 4.3 4.0  Chloride 98 - 111 mmol/L 106 108 105  CO2 22 - 32 mmol/L 23 24 19(L)  Calcium 8.9 - 10.3 mg/dL 8.6(L) 8.9 9.1  Total Protein 6.5 - 8.1 g/dL 6.4(L) 7.2 6.2  Total Bilirubin 0.3 - 1.2 mg/dL 0.5 1.0 0.3  Alkaline Phos 38 - 126 U/L 78 89 79  AST 15 - 41 U/L 32 37 19  ALT 0 - 44 U/L 40 46(H) 34   CBC Latest Ref Rng & Units 09/23/2019 09/23/2019 02/23/2019  WBC 4.0 - 10.5 K/uL 8.2 9.7 6.8  Hemoglobin 13.0 - 17.0 g/dL 15.2 16.5 15.6  Hematocrit 39.0 - 52.0 % 43.4 49.3 46.2  Platelets 150 - 400 K/uL 127(L) 127(L) 128(L)    Lipid Panel     Component Value Date/Time   CHOL 117 09/23/2019 1012   CHOL 109 02/23/2019 1013   TRIG 64 09/23/2019 1012   HDL 38 (L) 09/23/2019 1012   HDL 35 (L) 02/23/2019 1013   CHOLHDL 3.1 09/23/2019 1012   VLDL 13 09/23/2019 1012   LDLCALC 66 09/23/2019 1012   LDLCALC 30 02/23/2019 1013   HEMOGLOBIN A1C Lab Results  Component Value Date   HGBA1C 5.9 (H) 09/23/2019   MPG 122.63 09/23/2019   TSH No results for input(s): TSH in the last 8760 hours. BNP (last 3 results) No results for input(s): BNP in the last 8760 hours.  Medications and allergies  No Known Allergies   Prior to Admission medications   Medication Sig Start Date End Date Taking? Authorizing Provider  atorvastatin (LIPITOR) 40 MG tablet Take 1 tablet (40 mg total) by mouth daily. 07/26/19   Kerin Perna, NP  levETIRAcetam (KEPPRA) 1000 MG tablet Take 1 tablet (1,000 mg total) by mouth 2 (two) times daily. 06/15/19   Cameron Sprang, MD    . sodium chloride      Current Outpatient Medications  Medication Instructions  . atorvastatin (LIPITOR) 40 mg, Oral, Daily  . levETIRAcetam (KEPPRA) 1,000 mg, Oral, 2 times daily    No intake/output data recorded. No intake/output data recorded.    Radiology:  [facility] Chest Port 1 View  Result Date: 09/23/2019 CLINICAL DATA:  Chest pain. EXAM: PORTABLE CHEST 1 VIEW COMPARISON:  October 26, 2018. FINDINGS: The heart size and mediastinal contours are within normal limits. Both lungs are clear. No pneumothorax or pleural effusion is noted. The visualized skeletal structures are unremarkable. IMPRESSION: No active disease. Electronically Signed   By: Marijo Conception M.D.   On: 09/23/2019 09:31   Cardiac Studies:  Echocardiogram 07/12/2017: Normal LV systolic function, moderate LVH, EF 55 to 60%, grade 1 diastolic dysfunction.  Mild tricuspid regurgitation mild pulmonary tension.  ABI 11/07/2016: Right ABI indicates mild reduction in arterial flow. Left ABI   indicates normal arterial flow.  Assessment   Aadhav Uhlig  is a 59 y.o.  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  Patient then presented with DVT and PE in May 2018 and was started on Xarelto.  Patient has discontinued this in the interim, past medical history significant for hypertension, seizure disorder following stroke in 2017, last episode 6 months ago, hyperglycemia, tobacco use disorder.  He presented this morning with chest pain that started last evening, subsided but again this morning had severe chest pain and his sister activated EMS, in the emergency room found to have inferolateral myocardial infarction with STEMI.  Acute inferolateral myocardial infarction EKG 09/23/2019: ST elevation noted in inferolateral leads with risk for congestive depression. 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 History of seizure disorder following stroke in 2017. Hypertension, essential Hyperlipidemia Hyperglycemia  Recommendations:   Patient will be taken emergently to the cardiac catheterization lab.  He is having ongoing chest discomfort.  Clinically not in acute decompensated heart failure.  Further recommendations to follow. Critical care 35 minutes.  [name], MD, [facility] 09/23/2019, 11:50 AM [facility] [place] Pager: [phone] Office: [phone]

## 2019-09-23 NOTE — ED Triage Notes (Signed)
Patient arrived by [facility] from home following CP that started last night. Denies radiation, no SOB. Patient received the following pTA Aspirin  324 NTG   1 Morphine  4mg   Alert and oriented, complains of mild pain on arrival

## 2019-09-23 NOTE — Significant Event (Signed)
Rapid Response Event Note  Overview: STEMI  Transported patient to Cath Lab with EMT/NT.   Shalita Notte R

## 2019-09-24 ENCOUNTER — Encounter (HOSPITAL_COMMUNITY): Payer: Self-pay | Admitting: Cardiology

## 2019-09-24 LAB — BASIC METABOLIC PANEL
Anion gap: 14 (ref 5–15)
BUN: 6 mg/dL (ref 6–20)
CO2: 21 mmol/L — ABNORMAL LOW (ref 22–32)
Calcium: 8.4 mg/dL — ABNORMAL LOW (ref 8.9–10.3)
Chloride: 104 mmol/L (ref 98–111)
Creatinine, Ser: 0.79 mg/dL (ref 0.61–1.24)
GFR calc Af Amer: >60 mL/min (ref >60)
GFR calc non Af Amer: >60 mL/min (ref >60)
Glucose, Bld: 115 mg/dL — ABNORMAL HIGH (ref 70–99)
Potassium: 3.5 mmol/L (ref 3.5–5.1)
Sodium: 139 mmol/L (ref 135–145)

## 2019-09-24 LAB — POCT I-STAT, CHEM 8
BUN: 8 mg/dL (ref 6–20)
Calcium, Ion: 1.18 mmol/L (ref 1.15–1.40)
Chloride: 106 mmol/L (ref 98–111)
Creatinine, Ser: 0.7 mg/dL (ref 0.61–1.24)
Glucose, Bld: 204 mg/dL — ABNORMAL HIGH (ref 70–99)
HCT: 45 % (ref 39.0–52.0)
Hemoglobin: 15.3 g/dL (ref 13.0–17.0)
Potassium: 3.6 mmol/L (ref 3.5–5.1)
Sodium: 142 mmol/L (ref 135–145)
TCO2: 26 mmol/L (ref 22–32)

## 2019-09-24 LAB — CBC
HCT: 38.1 % — ABNORMAL LOW (ref 39.0–52.0)
Hemoglobin: 13.3 g/dL (ref 13.0–17.0)
MCH: 31.8 pg (ref 26.0–34.0)
MCHC: 34.9 g/dL (ref 30.0–36.0)
MCV: 91.1 fL (ref 80.0–100.0)
Platelets: 111 10*3/uL — ABNORMAL LOW (ref 150–400)
RBC: 4.18 MIL/uL — ABNORMAL LOW (ref 4.22–5.81)
RDW: 14.1 % (ref 11.5–15.5)
WBC: 7.9 10*3/uL (ref 4.0–10.5)
nRBC: 0 % (ref 0.0–0.2)

## 2019-09-24 LAB — POCT ACTIVATED CLOTTING TIME
Activated Clotting Time: 340 seconds
Activated Clotting Time: 257 seconds

## 2019-09-24 LAB — TSH: TSH: <0.01 u[IU]/mL — ABNORMAL LOW (ref 0.350–4.500)

## 2019-09-24 MED ORDER — LISINOPRIL 10 MG PO TABS
10.0000 mg | ORAL_TABLET | Freq: Every evening | ORAL | Status: DC
  Administered 2019-09-24: 10 mg via ORAL
  Filled 2019-09-24: qty 1

## 2019-09-24 MED ORDER — SODIUM CHLORIDE 0.9 % IV SOLN: INTRAVENOUS | Status: DC

## 2019-09-24 MED ORDER — ATORVASTATIN CALCIUM 40 MG PO TABS: 40.0000 mg | ORAL_TABLET | Freq: Every day | ORAL | Status: DC

## 2019-09-24 MED ORDER — CHLORHEXIDINE GLUCONATE CLOTH 2 % EX PADS
6.0000 | MEDICATED_PAD | Freq: Every day | CUTANEOUS | Status: DC
  Administered 2019-09-25: 6 via TOPICAL

## 2019-09-24 MED ORDER — METOPROLOL SUCCINATE ER 50 MG PO TB24
50.0000 mg | ORAL_TABLET | Freq: Every day | ORAL | Status: DC
  Administered 2019-09-24 – 2019-09-25 (×2): 50 mg via ORAL
  Filled 2019-09-24 (×2): qty 1

## 2019-09-24 NOTE — H&P (View-Only) (Signed)
Subjective:  Feels well, no further chest pain or shortness of breath.  Intake/Output from previous day:  I/O last 3 completed shifts: In: 12 [P.O.:390; I.V.:100] Out: 350 [Urine:350] Total I/O In: 1060 [P.O.:360; I.V.:700] Out: 600 [Urine:600]  Blood pressure 132/89, pulse 71, temperature 98.5 F (36.9 C), temperature source Oral, resp. rate (!) 28, height 6' 3"  (1.905 m), weight 104.3 kg, SpO2 100 %. Body mass index is 28.75 kg/m.  Physical Exam  Constitutional: He is oriented to person, place, and time.  He is well-built and mildly obese in no acute distress.  Neck: No thyromegaly present.  Cardiovascular: Normal rate, regular rhythm, normal heart sounds and intact distal pulses. Exam reveals no gallop.  No murmur heard. Pulses:      Carotid pulses are 2+ on the right side and 2+ on the left side.      Femoral pulses are 2+ on the left side.      Right popliteal pulse not accessible.       Dorsalis pedis pulses are 2+ on the left side. Right dorsalis pedis pulse not accessible.       Posterior tibial pulses are 1+ on the left side. Right posterior tibial pulse not accessible.  No leg edema, no JVD. Right AKA stump noted  Pulmonary/Chest: Effort normal and breath sounds normal.  Abdominal: Soft. Bowel sounds are normal.  Musculoskeletal:        General: No edema. Normal range of motion.     Cervical back: Neck supple.  Neurological: He is alert and oriented to person, place, and time.   Lab Results: BMP BNP (last 3 results) No results for input(s): BNP in the last 8760 hours.  ProBNP (last 3 results) No results for input(s): PROBNP in the last 8760 hours. BMP Latest Ref Rng & Units 09/24/2019 09/23/2019 09/23/2019  Glucose 70 - 99 mg/dL 115(H) 203(H) 133(H)  BUN 6 - 20 mg/dL 6 7 8   Creatinine 0.61 - 1.24 mg/dL 0.79 0.94 0.92  BUN/Creat Ratio 9 - 20 - - -  Sodium 135 - 145 mmol/L 139 141 143  Potassium 3.5 - 5.1 mmol/L 3.5 3.6 4.3  Chloride 98 - 111 mmol/L 104 106 108   CO2 22 - 32 mmol/L 21(L) 23 24  Calcium 8.9 - 10.3 mg/dL 8.4(L) 8.6(L) 8.9   Hepatic Function Latest Ref Rng & Units 09/23/2019 09/23/2019 02/23/2019  Total Protein 6.5 - 8.1 g/dL 6.4(L) 7.2 6.2  Albumin 3.5 - 5.0 g/dL 3.5 4.0 4.0  AST 15 - 41 U/L 32 37 19  ALT 0 - 44 U/L 40 46(H) 34  Alk Phosphatase 38 - 126 U/L 78 89 79  Total Bilirubin 0.3 - 1.2 mg/dL 0.5 1.0 0.3  Bilirubin, Direct 0.1 - 0.5 mg/dL - - -   CBC Latest Ref Rng & Units 09/24/2019 09/23/2019 09/23/2019  WBC 4.0 - 10.5 K/uL 7.9 8.2 9.7  Hemoglobin 13.0 - 17.0 g/dL 13.3 15.2 16.5  Hematocrit 39.0 - 52.0 % 38.1(L) 43.4 49.3  Platelets 150 - 400 K/uL 111(L) 127(L) 127(L)   Lipid Panel     Component Value Date/Time   CHOL 117 09/23/2019 1012   CHOL 109 02/23/2019 1013   TRIG 64 09/23/2019 1012   HDL 38 (L) 09/23/2019 1012   HDL 35 (L) 02/23/2019 1013   CHOLHDL 3.1 09/23/2019 1012   VLDL 13 09/23/2019 1012   LDLCALC 66 09/23/2019 1012   LDLCALC 30 02/23/2019 1013   Cardiac Panel (last 3 results) No results for input(s): CKTOTAL,  CKMB, TROPONINI, RELINDX in the last 72 hours.  HEMOGLOBIN A1C Lab Results  Component Value Date   HGBA1C 5.9 (H) 09/23/2019   MPG 122.63 09/23/2019    Ref Range & Units 1 d ago  (09/23/19) 1 d ago  (09/23/19) 1 d ago  (09/23/19)  Troponin I (High Sensitivity) <18 ng/L >27,000High Panic   851High Panic  CM  1,003High Panic  CM    TSH No results for input(s): TSH in the last 8760 hours. Imaging: DG Chest Port 1 View  Result Date: 09/23/2019 CLINICAL DATA:  Chest pain. EXAM: PORTABLE CHEST 1 VIEW COMPARISON:  October 26, 2018. FINDINGS: The heart size and mediastinal contours are within normal limits. Both lungs are clear. No pneumothorax or pleural effusion is noted. The visualized skeletal structures are unremarkable. IMPRESSION: No active disease. Electronically Signed   By: Marijo Conception M.D.   On: 09/23/2019 09:31   Cardiac Studies:  ABI 11/07/2016: Right ABI indicates mild reduction in  arterial flow. Left ABI  indicates normal arterial flow.  EKG: 09/23/2019: Normal sinus rhythm, inferior, posterior and lateral ST elevation myocardial infarction. EKG 09/24/2019: Normal sinus rhythm, inferior and lateral ischemia.  Posterior infarct old.  Prolonged QT.  Left Heart Catheterization 09/23/19:  LV: Mild LV dysfunction, EF 45 to 50% with inferior and inferolateral hypokinesis, EDP normal. RCA is large and has mild luminal irregularity. Left main is large and is normal. LAD is a large-caliber vessel giving origin to large D1, mild luminal irregularity to moderate disease is evident in the midsegment. Circumflex: Very large vessel.  Mid segment is occluded.  SP thrombectomy followed by stenting with 3.0 x 18 mm resolute Onyx DES.  The guidewire was sheared at the distal edge of the stent and had to be trapped with the second stent, 3.0 x 18 mm resolute DES.  Recommendation: Patient will need dual antiplatelet therapy for at least 1 year.  Do not expect any long-term complications from the sheared wire.  We will watch him closely.  175 mL  Echocardiogram 09/23/2019:   1. Left ventricular ejection fraction, by visual estimation, is 45 to 50%. The left ventricle has mildly decreased function. There is moderately increased left ventricular hypertrophy. Moderate hypokinesis of the left ventricular, entire inferolateral  wall, inferior wall and inferoseptal wall. Left ventricular diastolic function could not be evaluated. Moderate inferior, lateral and posterolateral myocardial infarction. 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  2. The mitral valve is myxomatous. Trivial mitral valve regurgitation.  3. The aortic valve is tricuspid. Aortic valve regurgitation is not visualized.  4. 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 Clinical correlation recommended.  5. The inferior vena cava is dilated in size with <50% respiratory variability, suggesting right atrial pressure of 15 mmHg.   Scheduled Meds: . aspirin  81 mg Oral Daily  . atorvastatin  40 mg Oral Daily  . Chlorhexidine Gluconate Cloth  6 each Topical Daily  . levETIRAcetam  1,000 mg Oral BID  . sodium chloride flush  3 mL Intravenous Q12H  . ticagrelor  90 mg Oral BID  . zolpidem  10 mg Oral QHS   Continuous Infusions: . sodium chloride    . sodium chloride     PRN Meds:.sodium chloride, acetaminophen, ALPRAZolam, ondansetron (ZOFRAN) IV, sodium chloride flush, traMADol  Assessment/Plan:  Acute STEMI involving inferior wall,  lateral wall and posterior wall SP successful angioplasty to codominant circumflex with implantation of 2 overlapping 3.0 x 18 mm Onyx resolute DES, guidewire sharing leading to use of second stent to sandwich the guidewire. 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  May benefit from long-term anticoagulation. Loop recorder in situ placed 2017, no data available.  Will interrogate. 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 History of seizure disorder following stroke in 2017. Hypertension, essential Hyperlipidemia  Recommendation: Transfer to telemetry, add beta-blocker along with an ACE inhibitor, continue dual antiplatelet therapy.  Discharge planning and smoking cessation.Hyperglycemia.  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  I will schedule him for TEE tomorrow morning, to reevaluate the aortic  valve.  He may benefit from being on long-term anticoagulants.   Adrian Prows, M.D. 09/24/2019, 6:53 AM [facility] Pager: [phone] Office: [phone] If no answer: [phone]

## 2019-09-24 NOTE — Progress Notes (Signed)
Pt is w/c bound. Discussed MI, stent, Brilinta/ASA, restrictions, smoking cessation, NTG, and CRPII. He is eager to go home. He sts he is thinking about quitting smoking but was not interested in discussion or tips. I gave him resource sheet. He also did not want to discuss diet but received information sheet. Will refer to [place] CRPII however pt sts he is not interested. He understands the importance of Brilinta. CR will sign off. [id number] Ethelda Chick CES, ACSM 11:51 AM 09/24/2019

## 2019-09-24 NOTE — Progress Notes (Signed)
Subjective:  Feels well, no further chest pain or shortness of breath.  Intake/Output from previous day:  I/O last 3 completed shifts: In: 41 [P.O.:390; I.V.:100] Out: 350 [Urine:350] Total I/O In: 1060 [P.O.:360; I.V.:700] Out: 600 [Urine:600]  Blood pressure 132/89, pulse 71, temperature 98.5 F (36.9 C), temperature source Oral, resp. rate (!) 28, height 6' 3"  (1.905 m), weight 104.3 kg, SpO2 100 %. Body mass index is 28.75 kg/m.  Physical Exam  Constitutional: He is oriented to person, place, and time.  He is well-built and mildly obese in no acute distress.  Neck: No thyromegaly present.  Cardiovascular: Normal rate, regular rhythm, normal heart sounds and intact distal pulses. Exam reveals no gallop.  No murmur heard. Pulses:      Carotid pulses are 2+ on the right side and 2+ on the left side.      Femoral pulses are 2+ on the left side.      Right popliteal pulse not accessible.       Dorsalis pedis pulses are 2+ on the left side. Right dorsalis pedis pulse not accessible.       Posterior tibial pulses are 1+ on the left side. Right posterior tibial pulse not accessible.  No leg edema, no JVD. Right AKA stump noted  Pulmonary/Chest: Effort normal and breath sounds normal.  Abdominal: Soft. Bowel sounds are normal.  Musculoskeletal:        General: No edema. Normal range of motion.     Cervical back: Neck supple.  Neurological: He is alert and oriented to person, place, and time.   Lab Results: BMP BNP (last 3 results) No results for input(s): BNP in the last 8760 hours.  ProBNP (last 3 results) No results for input(s): PROBNP in the last 8760 hours. BMP Latest Ref Rng & Units 09/24/2019 09/23/2019 09/23/2019  Glucose 70 - 99 mg/dL 115(H) 203(H) 133(H)  BUN 6 - 20 mg/dL 6 7 8   Creatinine 0.61 - 1.24 mg/dL 0.79 0.94 0.92  BUN/Creat Ratio 9 - 20 - - -  Sodium 135 - 145 mmol/L 139 141 143  Potassium 3.5 - 5.1 mmol/L 3.5 3.6 4.3  Chloride 98 - 111 mmol/L 104 106 108   CO2 22 - 32 mmol/L 21(L) 23 24  Calcium 8.9 - 10.3 mg/dL 8.4(L) 8.6(L) 8.9   Hepatic Function Latest Ref Rng & Units 09/23/2019 09/23/2019 02/23/2019  Total Protein 6.5 - 8.1 g/dL 6.4(L) 7.2 6.2  Albumin 3.5 - 5.0 g/dL 3.5 4.0 4.0  AST 15 - 41 U/L 32 37 19  ALT 0 - 44 U/L 40 46(H) 34  Alk Phosphatase 38 - 126 U/L 78 89 79  Total Bilirubin 0.3 - 1.2 mg/dL 0.5 1.0 0.3  Bilirubin, Direct 0.1 - 0.5 mg/dL - - -   CBC Latest Ref Rng & Units 09/24/2019 09/23/2019 09/23/2019  WBC 4.0 - 10.5 K/uL 7.9 8.2 9.7  Hemoglobin 13.0 - 17.0 g/dL 13.3 15.2 16.5  Hematocrit 39.0 - 52.0 % 38.1(L) 43.4 49.3  Platelets 150 - 400 K/uL 111(L) 127(L) 127(L)   Lipid Panel     Component Value Date/Time   CHOL 117 09/23/2019 1012   CHOL 109 02/23/2019 1013   TRIG 64 09/23/2019 1012   HDL 38 (L) 09/23/2019 1012   HDL 35 (L) 02/23/2019 1013   CHOLHDL 3.1 09/23/2019 1012   VLDL 13 09/23/2019 1012   LDLCALC 66 09/23/2019 1012   LDLCALC 30 02/23/2019 1013   Cardiac Panel (last 3 results) No results for input(s): CKTOTAL,  CKMB, TROPONINI, RELINDX in the last 72 hours.  HEMOGLOBIN A1C Lab Results  Component Value Date   HGBA1C 5.9 (H) 09/23/2019   MPG 122.63 09/23/2019    Ref Range & Units 1 d ago  (09/23/19) 1 d ago  (09/23/19) 1 d ago  (09/23/19)  Troponin I (High Sensitivity) <18 ng/L >27,000High Panic   851High Panic  CM  1,003High Panic  CM    TSH No results for input(s): TSH in the last 8760 hours. Imaging: DG Chest Port 1 View  Result Date: 09/23/2019 CLINICAL DATA:  Chest pain. EXAM: PORTABLE CHEST 1 VIEW COMPARISON:  October 26, 2018. FINDINGS: The heart size and mediastinal contours are within normal limits. Both lungs are clear. No pneumothorax or pleural effusion is noted. The visualized skeletal structures are unremarkable. IMPRESSION: No active disease. Electronically Signed   By: Marijo Conception M.D.   On: 09/23/2019 09:31   Cardiac Studies:  ABI 11/07/2016: Right ABI indicates mild reduction in  arterial flow. Left ABI  indicates normal arterial flow.  EKG: 09/23/2019: Normal sinus rhythm, inferior, posterior and lateral ST elevation myocardial infarction. EKG 09/24/2019: Normal sinus rhythm, inferior and lateral ischemia.  Posterior infarct old.  Prolonged QT.  Left Heart Catheterization 09/23/19:  LV: Mild LV dysfunction, EF 45 to 50% with inferior and inferolateral hypokinesis, EDP normal. RCA is large and has mild luminal irregularity. Left main is large and is normal. LAD is a large-caliber vessel giving origin to large D1, mild luminal irregularity to moderate disease is evident in the midsegment. Circumflex: Very large vessel.  Mid segment is occluded.  SP thrombectomy followed by stenting with 3.0 x 18 mm resolute Onyx DES.  The guidewire was sheared at the distal edge of the stent and had to be trapped with the second stent, 3.0 x 18 mm resolute DES.  Recommendation: Patient will need dual antiplatelet therapy for at least 1 year.  Do not expect any long-term complications from the sheared wire.  We will watch him closely.  175 mL  Echocardiogram 09/23/2019:   1. Left ventricular ejection fraction, by visual estimation, is 45 to 50%. The left ventricle has mildly decreased function. There is moderately increased left ventricular hypertrophy. Moderate hypokinesis of the left ventricular, entire inferolateral  wall, inferior wall and inferoseptal wall. Left ventricular diastolic function could not be evaluated. Moderate inferior, lateral and posterolateral myocardial infarction. 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  2. The mitral valve is myxomatous. Trivial mitral valve regurgitation.  3. The aortic valve is tricuspid. Aortic valve regurgitation is not visualized.  4. 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 Clinical correlation recommended.  5. The inferior vena cava is dilated in size with <50% respiratory variability, suggesting right atrial pressure of 15 mmHg.   Scheduled Meds: . aspirin  81 mg Oral Daily  . atorvastatin  40 mg Oral Daily  . Chlorhexidine Gluconate Cloth  6 each Topical Daily  . levETIRAcetam  1,000 mg Oral BID  . sodium chloride flush  3 mL Intravenous Q12H  . ticagrelor  90 mg Oral BID  . zolpidem  10 mg Oral QHS   Continuous Infusions: . sodium chloride    . sodium chloride     PRN Meds:.sodium chloride, acetaminophen, ALPRAZolam, ondansetron (ZOFRAN) IV, sodium chloride flush, traMADol  Assessment/Plan:  Acute STEMI involving inferior wall,  lateral wall and posterior wall SP successful angioplasty to codominant circumflex with implantation of 2 overlapping 3.0 x 18 mm Onyx resolute DES, guidewire sharing leading to use of second stent to sandwich the guidewire. 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  May benefit from long-term anticoagulation. Loop recorder in situ placed 2017, no data available.  Will interrogate. 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 History of seizure disorder following stroke in 2017. Hypertension, essential Hyperlipidemia  Recommendation: Transfer to telemetry, add beta-blocker along with an ACE inhibitor, continue dual antiplatelet therapy.  Discharge planning and smoking cessation.Hyperglycemia.  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  I will schedule him for TEE tomorrow morning, to reevaluate the aortic  valve.  He may benefit from being on long-term anticoagulants.   Adrian Prows, M.D. 09/24/2019, 6:53 AM [facility] Pager: [phone] Office: [phone] If no answer: [phone]

## 2019-09-25 ENCOUNTER — Inpatient Hospital Stay (HOSPITAL_COMMUNITY): Payer: Medicare Other | Admitting: Certified Registered Nurse Anesthetist

## 2019-09-25 ENCOUNTER — Encounter (HOSPITAL_COMMUNITY)
Admission: EM | Disposition: A | Discharge status: Home / Self Care | Payer: Self-pay | Source: Home / Self Care | Attending: Cardiology

## 2019-09-25 ENCOUNTER — Inpatient Hospital Stay (HOSPITAL_COMMUNITY): Payer: Medicare Other

## 2019-09-25 HISTORY — PX: BUBBLE STUDY: SHX6837

## 2019-09-25 HISTORY — PX: TEE WITHOUT CARDIOVERSION: SHX5443

## 2019-09-25 SURGERY — ECHOCARDIOGRAM, TRANSESOPHAGEAL
Anesthesia: Monitor Anesthesia Care

## 2019-09-25 MED ORDER — LISINOPRIL 10 MG PO TABS: 10.0000 mg | ORAL_TABLET | Freq: Every evening | ORAL | 3 refills | Status: DC

## 2019-09-25 MED ORDER — ASPIRIN 81 MG PO CHEW: 81.0000 mg | CHEWABLE_TABLET | Freq: Every day | ORAL | 3 refills | Status: DC

## 2019-09-25 MED ORDER — BUTAMBEN-TETRACAINE-BENZOCAINE 2-2-14 % EX AERO
INHALATION_SPRAY | CUTANEOUS | Status: DC | PRN
  Administered 2019-09-25: 1 via TOPICAL

## 2019-09-25 MED ORDER — PROPOFOL 500 MG/50ML IV EMUL
INTRAVENOUS | Status: DC | PRN
  Administered 2019-09-25: 125 ug/kg/min via INTRAVENOUS

## 2019-09-25 MED ORDER — TICAGRELOR 90 MG PO TABS: 90.0000 mg | ORAL_TABLET | Freq: Two times a day (BID) | ORAL | 0 refills | Status: DC

## 2019-09-25 MED ORDER — LIDOCAINE 2% (20 MG/ML) 5 ML SYRINGE
INTRAMUSCULAR | Status: DC | PRN
  Administered 2019-09-25: 60 mg via INTRAVENOUS

## 2019-09-25 MED ORDER — METOPROLOL SUCCINATE ER 50 MG PO TB24: 50.0000 mg | ORAL_TABLET | Freq: Every day | ORAL | 1 refills | Status: DC

## 2019-09-25 MED ORDER — PROPOFOL 10 MG/ML IV BOLUS
INTRAVENOUS | Status: DC | PRN
  Administered 2019-09-25: 40 mg via INTRAVENOUS
  Administered 2019-09-25: 60 mg via INTRAVENOUS

## 2019-09-25 MED FILL — Nitroglycerin IV Soln 100 MCG/ML in D5W: INTRA_ARTERIAL | Qty: 10 | Status: AC

## 2019-09-25 NOTE — Anesthesia Procedure Notes (Signed)
Procedure Name: MAC Date/Time: 09/25/2019 8:30 AM Performed by: Leonor Liv, CRNA Pre-anesthesia Checklist: Patient identified, Emergency Drugs available, Suction available, Patient being monitored and Timeout performed Patient Re-evaluated:Patient Re-evaluated prior to induction Oxygen Delivery Method: Nasal cannula Airway Equipment and Method: Bite block Placement Confirmation: positive ETCO2 Dental Injury: Teeth and Oropharynx as per pre-operative assessment

## 2019-09-25 NOTE — Anesthesia Preprocedure Evaluation (Signed)
Anesthesia Evaluation  Patient identified by MRN, date of birth, ID band Patient awake    Reviewed: Allergy & Precautions, NPO status , Patient's Chart, lab work & pertinent test results  Airway Mallampati: II  TM Distance: >3 FB Neck ROM: Full    Dental no notable dental hx.    Pulmonary Current Smoker, former smoker,    breath sounds clear to auscultation       Cardiovascular hypertension, + Past MI and + Peripheral Vascular Disease   Rhythm:Regular Rate:Normal     Neuro/Psych Seizures -,  PSYCHIATRIC DISORDERS Depression  Neuromuscular disease CVA    GI/Hepatic negative GI ROS, Neg liver ROS,   Endo/Other  diabetes  Renal/GU      Musculoskeletal   Abdominal   Peds  Hematology  (+) Blood dyscrasia, anemia ,   Anesthesia Other Findings   Reproductive/Obstetrics                             Anesthesia Physical  Anesthesia Plan  ASA: III  Anesthesia Plan: MAC   Post-op Pain Management:    Induction: Intravenous  PONV Risk Score and Plan: 0 and Propofol infusion and TIVA  Airway Management Planned: Natural Airway  Additional Equipment:   Intra-op Plan:   Post-operative Plan:   Informed Consent: I have reviewed the patients History and Physical, chart, labs and discussed the procedure including the risks, benefits and alternatives for the proposed anesthesia with the patient or authorized representative who has indicated his/her understanding and acceptance.     Dental advisory given  Plan Discussed with: CRNA  Anesthesia Plan Comments:         Anesthesia Quick Evaluation

## 2019-09-25 NOTE — Progress Notes (Signed)
*  PRELIMINARY RESULTS* Echocardiogram Echocardiogram Transesophageal has been performed.  Pieter Partridge 09/25/2019, 9:06 AM

## 2019-09-25 NOTE — Anesthesia Postprocedure Evaluation (Signed)
Anesthesia Post Note  Patient: Ronald Forbes [facility]  Procedure(s) Performed: TRANSESOPHAGEAL ECHOCARDIOGRAM (TEE) (N/A ) BUBBLE STUDY     Patient location during evaluation: PACU Anesthesia Type: MAC Level of consciousness: awake and alert Pain management: pain level controlled Vital Signs Assessment: post-procedure vital signs reviewed and stable Respiratory status: spontaneous breathing Cardiovascular status: stable Anesthetic complications: no    Last Vitals:  Vitals:   09/25/19 0905 09/25/19 0915  BP: 95/62 103/68  Pulse: 70 62  Resp: (!) 23 (!) 25  Temp:    SpO2: 98% 94%    Last Pain:  Vitals:   09/25/19 0905  TempSrc:   PainSc: 0-No pain                 Nolon Nations

## 2019-09-25 NOTE — CV Procedure (Signed)
TEE: Under deepsedation,  Using PropafolTEE was performed without complications: LV: Inferolateral hypokinesis. LVEF 45% RV: Normal LA: Normal. Left atrial appendage: Normal without thrombus. Normal function. Inter atrial septum is intact without defect. Double contrast study negative for atrial level shunting. No late appearance of bubbles either. RA: Normal  MV: Normal Trace MR. TV: Normal Trace TR AV: Mild aortic valve sclerosis. Lambl's excresences noted at the tip of the non coronary and left coronary cups. No vegetation. No AI.  PV: Normal. Trace PI.  Thoracic and ascending aorta: Normal without significant plaque or atheromatous changes.  [name], MD, [facility] 09/25/2019, 8:52 AM [facility] Office: [phone]

## 2019-09-25 NOTE — Discharge Summary (Signed)
Physician Discharge Summary  Patient ID: Ronald Forbes MRN: [id number] DOB/AGE: 1961/03/22 59 y.o.  Admit date: 09/23/2019 Discharge date: 09/26/2019  Primary Discharge Diagnosis 1. Acute STEMI involving inferior wall, lateral wall and posterior wall SP successful angioplasty to codominant circumflex with implantation of 2 overlapping 3.0 x 18 mm Onyx resolute DES, guidewire sharing leading to use of second stent to sandwich the guidewire. 2. Loop recorder in situ placed 2017, has reached EOL, no A. Fib on interogation and reveals AT (artifact). 3. History of stroke and right hemiparesis 2017 4. 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 5. History of seizure disorder following stroke in 2017. 6. Essential Hypertension  7. Hyperlipidemia  Significant Diagnostic Studies:  EKG: 09/23/2019: Normal sinus rhythm, inferior, posterior and lateral ST elevation myocardial infarction. EKG 09/24/2019: Normal sinus rhythm, inferior and lateral ischemia.  Posterior infarct old.  Prolonged QT.  Left Heart Catheterization 09/23/19: LV: Mild LV dysfunction, EF 45 to 50% with inferior and inferolateral hypokinesis, EDP normal. RCA is large and has mild luminal irregularity. Left main is large and is normal. LAD is a large-caliber vessel giving origin to large D1, mild luminal irregularity to moderate disease is evident in the midsegment. Circumflex: Very large vessel. Mid segment is occluded. SP thrombectomy followed by stenting with 3.0 x 18 mm resolute Onyx DES. The guidewire was sheared at the distal edge of the stent and had to be trapped with the second stent, 3.0 x 18 mm resolute DES.  Recommendation: Patient will need dual antiplatelet therapy for at least 1 year. Do not expect any long-term complications from the sheared wire. We will watch him closely. 175 mL  Echocardiogram 09/23/2019:   1. Left ventricular ejection  fraction, by visual estimation, is 45 to 50%. The left ventricle has mildly decreased function. There is moderately increased left ventricular hypertrophy. Moderate hypokinesis of the left ventricular, entire inferolateral  wall, inferior wall and inferoseptal wall. Left ventricular diastolic function could not be evaluated. Moderate inferior, lateral and posterolateral myocardial infarction. 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  2. The mitral valve is myxomatous. Trivial mitral valve regurgitation.  3. The aortic valve is tricuspid. Aortic valve regurgitation is not visualized.  4. 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 Clinical correlation recommended.  5. The inferior vena cava is dilated in size with <50% respiratory variability, suggesting right atrial pressure of 15 mmHg.  TEE 09/22/2017: 1. Left ventricular ejection fraction, by estimation, is 40 to 45%. The  left ventricle has mild to moderately decreased function. The left  ventrical demonstrates regional wall motion abnormalities (see scoring  diagram/findings for description). There is  mildly increased left ventricular hypertrophy. Left ventricular diastolic  function could not be evaluated. There is moderate hypokinesis of the left  ventricular, mid-apical inferoseptal wall, inferior wall, lateral wall and  inferolateral wall.  2. Thickening and fibrosis of the left and non coronary cusps. No  vegetation.. The aortic valve is tricuspid. Aortic valve regurgitation is  not visualized. No aortic stenosis is present.  5. Agitated saline contrast bubble study was negative, with no evidence  of any interatrial shunt  Hospital Course:   Ronald Forbes  is a 59 y.o. 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  Patient then presented with DVT and PE in May 2018 and was started on Xarelto.  Patient has discontinued this in the interim, past medical history significant for hypertension, seizure disorder following stroke in 2017, last episode 6 months ago, hyperglycemia, tobacco use disorder.  Presented with chest pain and ST elevation in inferolateral leads and ST depression in anterior leads suggestive of inferior, posterior and lateral MI on 09/23/2019, emergently taken to the cardiac catheterization lab where he underwent successful angioplasty and stenting to a large codominant circumflex coronary artery.  His loop recorder was interrogated to exclude the possibility of A. fib as he has had stroke and also right common femoral artery occlusion needing right AKA.  However he has not had any A. fib, loop recorder has reached EOL.  Echocardiogram had revealed abnormal aortic valve morphology suggestive of marantic endocarditis and also speckled pattern hence underwent TEE to make a decision regarding use of long-term anticoagulation.  Fortunately TEE revealed only mild aortic valve thickening.  The day of discharge he was asymptomatic and was willing to make lifestyle changes and completely quit smoking.  Recommendations on discharge: He will need dual antiplatelet therapy for at least 1 year.  He was continued on high-dose high intensity statins for hyperlipidemia and tolerating beta-blockers along with ACE inhibitor.  Will be followed up in the office in 1 week to 10 days.  Discharge Exam: Blood pressure 122/74, pulse 80, temperature 98.1 F (36.7 C), temperature source Oral,  resp. rate 17, height 6\' 3"  (1.905 m), weight 104.3 kg, SpO2 100 %.  Physical Exam  Constitutional: He is oriented to person, place, and time.  He is well-built and mildly obese in no acute distress.  Neck: No thyromegaly present.  Cardiovascular: Normal rate, regular rhythm, normal heart sounds and intact distal pulses. Exam reveals no gallop.  No murmur heard. Pulses:      Carotid pulses are 2+ on the right side and 2+ on the left side.      Femoral pulses are 2+ on the left side.      Right popliteal pulse not accessible.       Dorsalis pedis pulses are 2+ on the left side. Right dorsalis pedis pulse not accessible.       Posterior tibial pulses are 1+ on the left side. Right posterior tibial pulse not accessible.  No leg edema, no JVD. Right AKA stump noted  Pulmonary/Chest: Effort normal and breath sounds normal.  Abdominal: Soft. Bowel sounds are normal.  Musculoskeletal:        General: No edema. Normal range of motion.     Cervical back: Neck supple.  Neurological: He is alert and oriented to person, place, and time.    Labs:   Lab Results  Component Value Date   WBC 7.9 09/24/2019   HGB 13.3 09/24/2019   HCT 38.1 (L) 09/24/2019   MCV 91.1 09/24/2019   PLT 111 (L) 09/24/2019    Recent Labs  Lab 09/23/19 1012 09/23/19 1012 09/24/19 0229  NA 141   < > 139  K 3.6   < > 3.5  CL 106   < > 104  CO2 23   < > 21*  BUN 7   < >  6  CREATININE 0.94   < > 0.79  CALCIUM 8.6*   < > 8.4*  PROT 6.4*  --   --   BILITOT 0.5  --   --   ALKPHOS 78  --   --   ALT 40  --   --   AST 32  --   --   GLUCOSE 203*   < > 115*   < > = values in this interval not displayed.   Lipid Panel     Component Value Date/Time   CHOL 117 09/23/2019 1012   CHOL 109 02/23/2019 1013   TRIG 64 09/23/2019 1012   HDL 38 (L) 09/23/2019 1012   HDL 35 (L) 02/23/2019 1013   CHOLHDL 3.1 09/23/2019 1012   VLDL 13 09/23/2019 1012   LDLCALC 66 09/23/2019 1012   LDLCALC 30 02/23/2019 1013  HEMOGLOBIN  A1C Lab Results  Component Value Date   HGBA1C 5.9 (H) 09/23/2019   MPG 122.63 09/23/2019   Cardiac Panel (last 3 results)  Ref Range & Units 1 d ago  (09/23/19) 1 d ago  (09/23/19) 1 d ago  (09/23/19)  Troponin I (High Sensitivity) <18 ng/L >27,000High Panic   851High Panic  CM  1,003High Panic  CM    TSH Recent Labs    09/23/19 1153  TSH <0.010*  Radiology: [facility] Chest Port 1 View  Result Date: 09/23/2019 CLINICAL DATA: Chest pain. EXAM: PORTABLE CHEST 1 VIEW COMPARISON: October 26, 2018. FINDINGS: The heart size and mediastinal contours are within normal limits. Both lungs are clear. No pneumothorax or pleural effusion is noted. The visualized skeletal structures are unremarkable. IMPRESSION: No active disease. Electronically Signed By: Lupita Raider M.D. On: 09/23/2019 09:31   FOLLOW UP PLANS AND APPOINTMENTS Discharge Instructions    AMB Referral to Cardiac Rehabilitation - Phase II   Complete by: As directed    Diagnosis:  STEMI Coronary Stents     After initial evaluation and assessments completed: Virtual Based Care may be provided alone or in conjunction with Phase 2 Cardiac Rehab based on patient barriers.: Yes     Allergies as of 09/25/2019   No Known Allergies     Medication List    TAKE these medications   aspirin 81 MG chewable tablet Chew 1 tablet (81 mg total) by mouth daily.   atorvastatin 40 MG tablet Commonly known as: LIPITOR Take 1 tablet (40 mg total) by mouth daily. What changed: when to take this   levETIRAcetam 1000 MG tablet Commonly known as: KEPPRA Take 1 tablet (1,000 mg total) by mouth 2 (two) times daily.   lisinopril 10 MG tablet Commonly known as: ZESTRIL Take 1 tablet (10 mg total) by mouth every evening.   metoprolol succinate 50 MG 24 hr tablet Commonly known as: TOPROL-XL Take 1 tablet (50 mg total) by mouth daily. Take with or immediately following a meal.   ticagrelor 90 MG Tabs tablet Commonly known as: BRILINTA Take 1  tablet (90 mg total) by mouth 2 (two) times daily.      Follow-up Information    [name], MD On 10/03/2019.   Specialty: Cardiology Why: 11:45 AM appointment. Bring all you medication Contact information: [address] [place] [address] [phone]          [name], MD 09/26/2019, 8:20 PM  Pager: [phone] Office: [phone] If no answer: [phone]

## 2019-09-25 NOTE — Interval H&P Note (Signed)
History and Physical Interval Note:  09/25/2019 8:29 AM  Ronald Forbes [facility]  has presented today for surgery, with the diagnosis of r/o vegetation.  The various methods of treatment have been discussed with the patient and family. After consideration of risks, benefits and other options for treatment, the patient has consented to  Procedure(s): TRANSESOPHAGEAL ECHOCARDIOGRAM (TEE) (N/A) as a surgical intervention.  The patient's history has been reviewed, patient examined, no change in status, stable for surgery.  I have reviewed the patient's chart and labs.  Questions were answered to the patient's satisfaction.     [name]

## 2019-09-25 NOTE — Transfer of Care (Signed)
Immediate Anesthesia Transfer of Care Note  Patient: Ronald Forbes [facility]  Procedure(s) Performed: TRANSESOPHAGEAL ECHOCARDIOGRAM (TEE) (N/A ) BUBBLE STUDY  Patient Location: Endoscopy Unit  Anesthesia Type:MAC  Level of Consciousness: drowsy  Airway & Oxygen Therapy: Patient Spontanous Breathing and Patient connected to nasal cannula oxygen  Post-op Assessment: Report given to RN, Post -op Vital signs reviewed and stable and Patient moving all extremities X 4  Post vital signs: Reviewed and stable  Last Vitals:  Vitals Value Taken Time  BP 129/86 09/25/19 0855  Temp    Pulse 70 09/25/19 0856  Resp 27 09/25/19 0856  SpO2 92 % 09/25/19 0856  Vitals shown include unvalidated device data.  Last Pain:  Vitals:   09/25/19 0814  TempSrc: Oral  PainSc: 0-No pain         Complications: No apparent anesthesia complications

## 2019-09-26 ENCOUNTER — Telehealth: Payer: Self-pay

## 2019-09-26 NOTE — Telephone Encounter (Signed)
Transition Care Management Follow-up Telephone Call  The patient was asleep and the call was completed with his sister, Ronald Forbes.  DPR on file to speak to [place].  Date of discharge and from where: 09/25/2019, [facility]  How have you been since you were released from the hospital? She said he is " doing fine."   Any questions or concerns? No questions/concerns at this time.   Items Reviewed:  Did the pt receive and understand the discharge instructions provided? yes  Medications obtained and verified? He has all medications, including the new ones. No questions at this time. Ronald Forbes said that she reminds him which medications to take.   Any new allergies since your discharge?none reported   Do you have support at home? His sister, Ronald Forbes, lives with him.  Other (ie: DME, Home Health, etc) no home health ordered. No new DME ordered.   Has wheelchair, crutches and lower extremity prosthesis  Functional Questionnaire: (I = Independent and D = Dependent) ADL's: sister provides any needed assist. She reports that he is independent with personal care,    Follow up appointments reviewed:    PCP Hospital f/u appt confirmed? His sister said that she would call to schedule an appointment, she did not want to schedule one at this time   Specialist Hospital f/u appt confirmed? cardiology - 10/03/2019, neurology- 10/16/2019  Are transportation arrangements needed?   No, his sister drives  If their condition worsens, is the pt aware to call  their PCP or go to the ED? yes  Was the patient provided with contact information for the PCP's office or ED?   His sister has the phone number for the clinic  Was the pt encouraged to call back with questions or concerns? yes

## 2019-10-03 ENCOUNTER — Other Ambulatory Visit: Payer: Self-pay

## 2019-10-03 ENCOUNTER — Encounter: Payer: Self-pay | Admitting: Cardiology

## 2019-10-03 ENCOUNTER — Ambulatory Visit: Payer: Medicare Other | Admitting: Cardiology

## 2019-10-03 VITALS — BP 126/76 | HR 57 | Temp 98.1°F | Ht 75.0 in | Wt 229.3 lb

## 2019-10-03 DIAGNOSIS — E7800 Pure hypercholesterolemia, unspecified: Secondary | ICD-10-CM

## 2019-10-03 DIAGNOSIS — I739 Peripheral vascular disease, unspecified: Secondary | ICD-10-CM

## 2019-10-03 DIAGNOSIS — I10 Essential (primary) hypertension: Secondary | ICD-10-CM

## 2019-10-03 DIAGNOSIS — R7989 Other specified abnormal findings of blood chemistry: Secondary | ICD-10-CM

## 2019-10-03 DIAGNOSIS — I2510 Atherosclerotic heart disease of native coronary artery without angina pectoris: Secondary | ICD-10-CM

## 2019-10-03 NOTE — Progress Notes (Signed)
Primary Physician/Referring:  Grayce Sessions, NP  Patient ID: Ronald Forbes, male    DOB: 12/05/1960, 59 y.o.   MRN: [id number]  Chief Complaint  Patient presents with  . New Patient (Initial Visit)  . Coronary Artery Disease   HPI:    Ronald Forbes  is a 60 y.o. AAMwith history of stroke in 2017 and has a loop recorder in place placed on 02/09/2016 (EOL and no A. Fib since implant), hypertension, hyperlipidemia, tobacco use disorder, seizure disorder following stroke in 2017. 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 Patient has discontinued this over time.  Presented with chest pain and STEMI involving inferior, posterior and lateral MI on 09/23/2019, emergently taken to the cardiac catheterization lab where he underwent successful angioplasty and stenting to a large codominant circumflex coronary artery. He now presents for f/u. Since hospital discharge, he has quit smoking completely.  He has been taking all his medications well.  He has not had any recurrence of chest pain.  Accompanied by his sister.  Past Medical History:  Diagnosis Date  . Alcohol use disorder   . Cerebral vascular accident (HCC)   . 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 Diabetes mellitus type 2 in obese (HCC)   . DVT (deep venous thrombosis) (HCC) 01/25/2017  . HLD (hyperlipidemia)   . Morbid obesity (HCC)   . PAD (peripheral artery disease) (HCC) 10/20/2016  . Pulmonary embolism (HCC) 07/11/2017  . Seizures (HCC)   . Stroke (HCC)   . Tobacco abuse    Past Surgical History:  Procedure Laterality Date  . ABDOMINAL AORTOGRAM  W/LOWER EXTREMITY Right 10/20/2016   Procedure: Abdominal Aortogram w/Lower Extremity;  Surgeon: Maeola Harman, MD;  Location: [facility] INVASIVE CV LAB;  Service: Cardiovascular;  Laterality: Right;  lower leg  . AMPUTATION Right 11/15/2016   Procedure: AMPUTATION ABOVE KNEE;  Surgeon: Sherren Kerns, MD;  Location: [facility] OR;  Service: Vascular;  Laterality: Right;  . BUBBLE STUDY  09/25/2019   Procedure: BUBBLE STUDY;  Surgeon: [name], MD;  Location: [facility] ENDOSCOPY;  Service: Cardiovascular;;  . CORONARY/GRAFT ACUTE MI REVASCULARIZATION N/A 09/23/2019   Procedure: Coronary/Graft Acute MI Revascularization;  Surgeon: [name], MD;  Location: [facility] INVASIVE CV LAB;  Service: Cardiovascular;  Laterality: N/A;  . EP IMPLANTABLE DEVICE N/A 02/09/2016   Procedure: Loop Recorder Insertion;  Surgeon: Duke Salvia, MD;  Location: [facility] INVASIVE CV LAB;  Service: Cardiovascular;  Laterality: N/A;  . FEMORAL-TIBIAL BYPASS GRAFT Right 10/25/2016   Procedure: BYPASS GRAFT FEMORAL-TIBIAL ARTERY USING NON REVERSED SAPPHENOUS VEIN;  Surgeon: Sherren Kerns, MD;  Location: [facility] OR;  Service: Vascular;  Laterality: Right;  . INTRAOPERATIVE ARTERIOGRAM Right 10/25/2016   Procedure: INTRA OPERATIVE ARTERIOGRAM;  Surgeon: Sherren Kerns, MD;  Location: [facility] OR;  Service: Vascular;  Laterality: Right;  . LEFT HEART CATH AND CORONARY ANGIOGRAPHY N/A 09/23/2019   Procedure: LEFT HEART CATH AND CORONARY ANGIOGRAPHY;  Surgeon: [name], MD;  Location: [facility] INVASIVE CV LAB;  Service: Cardiovascular;  Laterality: N/A;  . TEE WITHOUT CARDIOVERSION N/A 02/06/2016   Procedure: TRANSESOPHAGEAL ECHOCARDIOGRAM (TEE);  Surgeon: Wendall Stade, MD;  Location:  [facility] ENDOSCOPY;  Service: Cardiovascular;  Laterality: N/A;  . TEE WITHOUT CARDIOVERSION N/A 09/25/2019   Procedure: TRANSESOPHAGEAL ECHOCARDIOGRAM (TEE);  Surgeon: Adrian Prows, MD;  Location: [facility] ENDOSCOPY;  Service: Cardiovascular;  Laterality: N/A;   Social History   Tobacco Use  .  Smoking status: Current Every Day Smoker    Packs/day: 0.25    Years: 40.00    Pack years: 10.00    Types: Cigarettes  . Smokeless tobacco: Never Used  . Tobacco comment: Less than 1 pk per day  Substance Use Topics  . Alcohol use: No    Alcohol/week: 0.0 standard drinks    Comment: 6 pack/day (past)   Family History  Problem Relation Age of Onset  . Hypertension Mother     ROS  Review of Systems  Cardiovascular: Negative for dyspnea on exertion and leg swelling.  Gastrointestinal: Negative for melena.   Objective  Blood pressure 126/76, pulse (!) 57, temperature 98.1 F (36.7 C), height 6\' 3"  (1.905 m), weight 229 lb 4.5 oz (104 kg), SpO2 98 %.  Vitals with BMI 10/03/2019 09/25/2019 09/25/2019  Height 6\' 3"  - -  Weight 229 lbs 4 oz - -  BMI 21.30 - -  Systolic 865 - -  Diastolic 76 - -  Pulse 57 80 76     Physical Exam  Constitutional: He is oriented to person, place, and time. He appears well-developed and well-nourished.  He is well-built and mildly obese in no acute distress.  Cardiovascular: Normal rate, regular rhythm, normal heart sounds and intact distal pulses. Exam reveals no gallop.  No murmur heard. Pulses:      Carotid pulses are 2+ on the right side and 2+ on the left side.      Femoral pulses are 2+ on the left side.      Right popliteal pulse not accessible.       Dorsalis pedis pulses are 2+ on the left side. Right dorsalis pedis pulse not accessible.       Posterior tibial pulses are 1+ on the left side. Right posterior tibial pulse not accessible.  No leg edema, no JVD. Right AKA stump noted  Pulmonary/Chest: Effort normal and breath sounds normal.  Abdominal: Soft. Bowel sounds are normal.  Neurological: He is alert and oriented to person, place, and time.   Laboratory examination:   Recent Labs    09/23/19 0919 09/23/19 0919 09/23/19 0958 09/23/19 1012 09/24/19 0229  NA 143   < > 142 141 139  K 4.3   < > 3.6 3.6 3.5  CL 108   < > 106 106 104   CO2 24  --   --  23 21*  GLUCOSE 133*   < > 204* 203* 115*  BUN 8   < > 8 7 6   CREATININE 0.92   < > 0.70 0.94 0.79  CALCIUM 8.9  --   --  8.6* 8.4*  GFRNONAA >60  --   --  >60 >60  GFRAA >60  --   --  >60 >60   < > = values in this interval not displayed.   estimated creatinine clearance is 133 mL/min (by C-G formula based on SCr of 0.79 mg/dL).  CMP Latest Ref Rng & Units 09/24/2019 09/23/2019 09/23/2019  Glucose 70 - 99 mg/dL 115(H) 203(H) 204(H)  BUN 6 - 20 mg/dL 6 7 8   Creatinine 0.61 - 1.24 mg/dL 0.79 0.94 0.70  Sodium 135 - 145 mmol/L 139 141 142  Potassium 3.5 -  5.1 mmol/L 3.5 3.6 3.6  Chloride 98 - 111 mmol/L 104 106 106  CO2 22 - 32 mmol/L 21(L) 23 -  Calcium 8.9 - 10.3 mg/dL 2.5(K) 5.3(Z) -  Total Protein 6.5 - 8.1 g/dL - 6.4(L) -  Total Bilirubin 0.3 - 1.2 mg/dL - 0.5 -  Alkaline Phos 38 - 126 U/L - 78 -  AST 15 - 41 U/L - 32 -  ALT 0 - 44 U/L - 40 -   CBC Latest Ref Rng & Units 09/24/2019 09/23/2019 09/23/2019  WBC 4.0 - 10.5 K/uL 7.9 8.2 -  Hemoglobin 13.0 - 17.0 g/dL 76.7 34.1 93.7  Hematocrit 39.0 - 52.0 % 38.1(L) 43.4 45.0  Platelets 150 - 400 K/uL 111(L) 127(L) -   Lipid Panel     Component Value Date/Time   CHOL 117 09/23/2019 1012   CHOL 109 02/23/2019 1013   TRIG 64 09/23/2019 1012   HDL 38 (L) 09/23/2019 1012   HDL 35 (L) 02/23/2019 1013   CHOLHDL 3.1 09/23/2019 1012   VLDL 13 09/23/2019 1012   LDLCALC 66 09/23/2019 1012   LDLCALC 30 02/23/2019 1013   HEMOGLOBIN A1C Lab Results  Component Value Date   HGBA1C 5.9 (H) 09/23/2019   MPG 122.63 09/23/2019   TSH Recent Labs    09/23/19 1153  TSH <0.010*   Medications and allergies  No Known Allergies   Current Outpatient Medications  Medication Instructions  . aspirin 81 mg, Oral, Daily  . atorvastatin (LIPITOR) 40 mg, Oral, Daily  . levETIRAcetam (KEPPRA) 1,000 mg, Oral, 2 times daily  . lisinopril (ZESTRIL) 10 mg, Oral, Every evening  . metoprolol succinate (TOPROL-XL) 50 mg, Oral, Daily, Take  with or immediately following a meal.  . ticagrelor (BRILINTA) 90 mg, Oral, 2 times daily    Radiology:   No results found.  Cardiac Studies:   Left Heart Catheterization 09/23/19: LV: Mild LV dysfunction, EF 45 to 50% with inferior and inferolateral hypokinesis, EDP normal. RCA is large and has mild luminal irregularity. Left main is large and is normal. LAD is a large-caliber vessel giving origin to large D1, mild luminal irregularity to moderate disease is evident in the midsegment. Circumflex: Very large vessel. Mid segment is occluded. SP thrombectomy followed by stenting with 3.0 x 18 mm resolute Onyx DES. The guidewire was sheared at the distal edge of the stent and had to be trapped with the second stent, 3.0 x 18 mm resolute DES.  Recommendation: Patient will need dual antiplatelet therapy for at least 1 year. Do not expect any long-term complications from the sheared wire. We will watch him closely. 175 mL  Echocardiogram 09/23/2019: 1. Left ventricular ejection fraction, by visual estimation, is 45 to 50%. The left ventricle has mildly decreased function. There is moderately increased left ventricular hypertrophy. Moderate hypokinesis of the left ventricular, entire inferolateral  wall, inferior wall and inferoseptal wall. Left ventricular diastolic function could not be evaluated. Moderate inferior, lateral and posterolateral myocardial infarction. 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 2. The mitral valve is myxomatous. Trivial mitral valve regurgitation. 3. The aortic valve is tricuspid. Aortic valve regurgitation is not visualized. 4. 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 Clinical correlation recommended. 5. The inferior vena cava is dilated in size with  <50% respiratory variability, suggesting right atrial pressure of 15 mmHg.  TEE 09/22/2017:  1. Left ventricular ejection fraction, by estimation, is 40 to 45%. The  left ventricle has mild to moderately decreased function. The left  ventrical demonstrates regional wall motion abnormalities (see scoring  diagram/findings for description). There is  mildly increased left ventricular hypertrophy. Left ventricular diastolic  function could not be evaluated. There is moderate hypokinesis of the left  ventricular, mid-apical inferoseptal wall, inferior wall, lateral wall and  inferolateral wall.  2. Thickening and fibrosis of the left and non coronary cusps. No  vegetation.. The aortic valve is tricuspid. Aortic valve regurgitation is  not visualized. No aortic stenosis is present.  5. Agitated saline contrast bubble study was negative, with no evidence  of any interatrial shunt  Assessment     ICD-10-CM   1. Coronary artery disease involving native coronary artery of native heart without angina pectoris  I25.10 EKG 12-Lead  2. Benign essential HTN  I10   3. Pure hypercholesterolemia  E78.00   4. PAD (peripheral artery disease) (HCC)  I73.9   5. Abnormal TSH  R79.89 TSH+T4F+T3Free    EKG 02/17/1: Sinus bradycardia at rate of 56 bpm with first-degree AV block.  Inferior infarct old, posterior infarct old.  Cannot exclude inferior ischemia, anterolateral ischemia.  Abnormal EKG.   No significant change from  EKG 09/24/2019: Normal sinus rhythm, inferior and lateral ischemia. Posterior infarct old. Prolonged QT.    No orders of the defined types were placed in this encounter.   There are no discontinued medications.   Recommendations:   Ronald Forbes  is a 59 y.o. AAMwith history of stroke in 2017 and has a loop recorder in place placed on 02/09/2016 (EOL and no A. Fib since implant), hypertension, hyperlipidemia, tobacco use disorder, seizure disorder following stroke in 2017. 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 Patient has discontinued this over time.  Presented with chest pain and STEMI involving inferior, posterior and lateral MI on 09/23/2019, emergently taken to the cardiac catheterization lab where he underwent successful angioplasty and stenting to a large codominant circumflex coronary artery. He now presents for f/u.   Patient is currently doing well, tolerating all his medications.  Blood pressure is well controlled, he is on high-dose high intensity statin.  I have congratulated him on having quit smoking.  No changes in the medications were done today.    He had markedly abnormal TSH the hospital, I will repeat thyroid function test today.  If abnormal he will need a cardiology referral.  I would like to see him back in 3 months to improve compliance.  He will continue with dual antiplatelet therapy for at least 1 year.   [name], MD, [facility] 10/06/2019, 5:11 PM [facility] [place] Office: [phone]

## 2019-10-16 ENCOUNTER — Other Ambulatory Visit: Payer: Self-pay

## 2019-10-16 ENCOUNTER — Telehealth (INDEPENDENT_AMBULATORY_CARE_PROVIDER_SITE_OTHER): Payer: Medicare Other | Admitting: Neurology

## 2019-10-16 ENCOUNTER — Encounter: Payer: Self-pay | Admitting: Neurology

## 2019-10-16 DIAGNOSIS — G40209 Localization-related (focal) (partial) symptomatic epilepsy and epileptic syndromes with complex partial seizures, not intractable, without status epilepticus: Secondary | ICD-10-CM

## 2019-10-16 MED ORDER — LEVETIRACETAM 1000 MG PO TABS: 1000.0000 mg | ORAL_TABLET | Freq: Two times a day (BID) | ORAL | 3 refills | Status: DC

## 2019-10-16 NOTE — Progress Notes (Signed)
Telephone (Audio) Visit The purpose of this telephone visit is to provide medical care while limiting exposure to the novel coronavirus.    Consent was obtained for telephone visit:  Yes.   Answered questions that patient had about telehealth interaction:  Yes.   I discussed the limitations, risks, security and privacy concerns of performing an evaluation and management service by telephone. I also discussed with the patient that there may be a patient responsible charge related to this service. The patient expressed understanding and agreed to proceed.  Pt location: Home Physician Location: office Name of referring provider:  Grayce Sessions, NP I connected with .Ronald Forbes at patients initiation/request on 10/16/2019 at 11:30 AM EST by telephone and verified that I am speaking with the correct person using two identifiers.  Pt MRN:  [id number] Pt DOB:  1961-06-27   History of Present Illness:  The patient had a telephone visit on 10/16/2019. He was last seen in the neurology clinic 4 months ago for seizures secondary to stroke. His sister [name] is present to provide additional information. On his last visit, they reported a seizure in October 2020 when he ran out of medication. Levetiracetam dose increased to 1000mg  BID. They deny any further seizures since then. has not seen any staring/unresponsive episodes. He denies any seizures, no headaches, dizziness, myoclonic jerks. No side effects on LEV. He was in the hospital last month for chest pain and STEMI, he had angioplasty and stenting, and is recovering well. He apparently stopped Xarelto and will be continuing on dual antiplatelet therapy for at least a year. He sleeps around 4-5 hours then is wide awake. Mood stable. No falls. He manages his own medications. He does not drive.  Outpatient Encounter Medications as of 10/16/2019  Medication Sig  . aspirin 81 MG chewable tablet Chew 1 tablet (81 mg total) by mouth daily.  12/16/2019  atorvastatin (LIPITOR) 40 MG tablet Take 1 tablet (40 mg total) by mouth daily. (Patient taking differently: Take 40 mg by mouth at bedtime. )  . levETIRAcetam (KEPPRA) 1000 MG tablet Take 1 tablet (1,000 mg total) by mouth 2 (two) times daily.  Marland Kitchen lisinopril (ZESTRIL) 10 MG tablet Take 1 tablet (10 mg total) by mouth every evening.  . metoprolol succinate (TOPROL-XL) 50 MG 24 hr tablet Take 1 tablet (50 mg total) by mouth daily. Take with or immediately following a meal.  . ticagrelor (BRILINTA) 90 MG TABS tablet Take 1 tablet (90 mg total) by mouth 2 (two) times daily.   No facility-administered encounter medications on file as of 10/16/2019.     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 His sister reports that he had seizures at age 59 and was on a seizure medication in the past but was not taking it like now. Review of records indicate that he had no medical care until he had a stroke in 2017, then was admitted in June 2018 when he had 3 seizures in one day. EEG showed mild left hemisphere focal slowing. I personally reviewed MRI brain without contrast done 01/2017 which did not show any acute changes, there was a remote left PCA territory infarct with extensive surrounding encephalomalacia also involving the medial left temporal lobe. It notes that he ran out of medication, however prior to June  2018, he was not on an AED for seizures, he was on gabapentin for pain. He was discharged home on Levetiracetam 500mg  BID. He was back in the hospital in March 2020 for 4 seizures, again in the setting of medication noncompliance. He and his sister report that when he is taking the Levetiracetam 500mg  BID, he does not have any seizures. He unfortunately again ran out of medication for the past  week and had a seizure this morning in our office lobby. He was noted by staff post-ictally to have a glazed look, initially not responding, covered in drool, then agitated. He and his sister deny any prior warning symptoms, she notes he usually leans to the left when he has a seizure. His sister moved in to live with him 2 years ago after his right BKA. He manages his own medications. His sister denies any staring/unresponsive episodes, he denies any olfactory/gustatory hallucinations, deja vu, rising epigastric sensation, focal numbness/tingling, myoclonic jerks. He denies any headaches, dizziness, neck/back pain, bowel/bladder dysfunction. He is wheelchair-bound and does his own transfers at home. His sister states he does not drink a lot of alcohol, he drinks a beer every once in a while.   Epilepsy Risk Factors:  Large left PCA stroke with encephalomalacia involving left medial temporal lobe. Otherwise he had a normal birth and early development.  There is no history of febrile convulsions, CNS infections such as meningitis/encephalitis, significant traumatic brain injury, neurosurgical procedures, or family history of seizures.    Observations/Objective:   Exam limited due to nature of phone visit, patient is awake, alert, able to answer questions without dysarthria.  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 No seizures since Levetiracetam dose increased to 1000mg  BID in October 2020. Refills sent. Continue control of vascular risk factors for secondary stroke prevention, he is dual antiplatelet therapy.  He does not drive. Follow-up in 4-5 months, they know to call for any changes.    Follow Up Instructions:   -I discussed the assessment and treatment plan with the patient. The patient was provided an  opportunity to ask questions and all were answered. The patient agreed with the plan and demonstrated an understanding of the instructions.   The patient was advised to call back or seek an in-person evaluation if the symptoms worsen or if the condition fails to improve as anticipated.    Total Time spent in visit with the patient was:  5:05 minutes, of which 100% of the time was spent in counseling and/or coordinating care on the above.   Pt understands and agrees with the plan of care outlined.     Cameron Sprang, MD

## 2019-10-23 ENCOUNTER — Other Ambulatory Visit: Payer: Self-pay

## 2019-10-23 MED ORDER — TICAGRELOR 90 MG PO TABS: 90.0000 mg | ORAL_TABLET | Freq: Two times a day (BID) | ORAL | 0 refills | Status: DC

## 2019-12-10 ENCOUNTER — Other Ambulatory Visit: Payer: Self-pay | Admitting: Cardiology

## 2020-01-17 ENCOUNTER — Other Ambulatory Visit: Payer: Self-pay | Admitting: Cardiology

## 2020-02-20 ENCOUNTER — Ambulatory Visit: Payer: Medicare Other | Admitting: Neurology

## 2020-02-25 ENCOUNTER — Other Ambulatory Visit: Payer: Self-pay

## 2020-02-25 ENCOUNTER — Encounter: Payer: Self-pay | Admitting: Neurology

## 2020-02-25 ENCOUNTER — Ambulatory Visit (INDEPENDENT_AMBULATORY_CARE_PROVIDER_SITE_OTHER): Payer: Medicare Other | Admitting: Neurology

## 2020-02-25 VITALS — BP 137/81 | HR 69 | Resp 18 | Ht 75.0 in | Wt 220.0 lb

## 2020-02-25 DIAGNOSIS — I2119 ST elevation (STEMI) myocardial infarction involving other coronary artery of inferior wall: Secondary | ICD-10-CM

## 2020-02-25 DIAGNOSIS — G40209 Localization-related (focal) (partial) symptomatic epilepsy and epileptic syndromes with complex partial seizures, not intractable, without status epilepticus: Secondary | ICD-10-CM | POA: Diagnosis not present

## 2020-02-25 DIAGNOSIS — Z8673 Personal history of transient ischemic attack (TIA), and cerebral infarction without residual deficits: Secondary | ICD-10-CM

## 2020-02-25 MED ORDER — LEVETIRACETAM 1000 MG PO TABS: 1000.0000 mg | ORAL_TABLET | Freq: Two times a day (BID) | ORAL | 3 refills | Status: DC

## 2020-02-25 NOTE — Progress Notes (Signed)
NEUROLOGY FOLLOW UP OFFICE NOTE  Alcus Bradly [id number] November 20, 1960  HISTORY OF PRESENT ILLNESS: I had the pleasure of seeing Erik Burkett [facility] in follow-up in the neurology clinic on 02/25/2020.  The patient was last evaluated in the neurology clinic via telephonic communication 4 months ago. He is again accompanied by his sister who helps supplement the history today. Since his last visit, he and his sister deny any seizures since October 2020 when he ran out of medication. He is taking Levetiracetam 1000mg  BID without side effects. He and his sister deny any staring/unresponsive episodes, gaps in time, olfactory/gustatory hallucinations, myoclonic jerks. He has occasional tingling on his right arm when he puts pressure on it. He denies any headaches, dizziness. His right ear has been "stopped up" and he has noticed decreased hearing in this ear for a year. He had a fall in the bathroom, no injuries. He manages his own medications. 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 Mood is good.    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 His sister reports that he had seizures at age 63 and was on a seizure medication in the past but was not taking it like now. Review of records indicate that he had no medical care until he had a stroke in 2017, then was admitted in June 2018 when he had 3 seizures in one day. EEG showed mild left hemisphere focal slowing. I personally reviewed MRI brain without contrast done 01/2017 which did not show any acute changes, there was a remote left PCA territory infarct with extensive surrounding encephalomalacia also involving the medial left temporal lobe. It notes that he  ran out of medication, however prior to June 2018, he was not on an AED for seizures, he was on gabapentin for pain. He was discharged home on Levetiracetam 500mg  BID. He was back in the hospital in March 2020 for 4 seizures, again in the setting of medication noncompliance. He and his sister report that when he is taking the Levetiracetam 500mg  BID, he does not have any seizures. He unfortunately again ran out of medication for the past week and had a seizure this morning in our office lobby. He was noted by staff post-ictally to have a glazed look, initially not responding, covered in drool, then agitated. He and his sister deny any prior warning symptoms, she notes he usually leans to the left when he has a seizure. His sister moved in to live with him 2 years ago after his right BKA. He manages his own medications. His sister denies any staring/unresponsive episodes, he denies any olfactory/gustatory hallucinations, deja vu, rising epigastric sensation, focal numbness/tingling, myoclonic jerks. He denies any headaches, dizziness, neck/back pain, bowel/bladder dysfunction. He is wheelchair-bound and does his own transfers at home. His sister states he does not drink a lot of alcohol, he drinks a beer every once in a while.   Epilepsy Risk Factors:  Large left PCA stroke with encephalomalacia involving left medial temporal lobe. Otherwise he had a normal birth and early development.  There is no history of febrile convulsions, CNS infections such as meningitis/encephalitis, significant traumatic brain injury, neurosurgical procedures, or family history of seizures.    PAST MEDICAL HISTORY: Past Medical  History:  Diagnosis Date  . Alcohol use disorder   . Cerebral vascular accident (HCC)   . 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 Diabetes mellitus type 2 in obese (HCC)   . DVT (deep venous thrombosis) (HCC) 01/25/2017  . HLD (hyperlipidemia)   .  Morbid obesity (HCC)   . PAD (peripheral artery disease) (HCC) 10/20/2016  . Pulmonary embolism (HCC) 07/11/2017  . Seizures (HCC)   . Stroke (HCC)   . Tobacco abuse     MEDICATIONS: Current Outpatient Medications on File Prior to Visit  Medication Sig Dispense Refill  . aspirin 81 MG chewable tablet Chew 1 tablet (81 mg total) by mouth daily. 100 tablet 3  . atorvastatin (LIPITOR) 40 MG tablet Take 1 tablet (40 mg total) by mouth daily. (Patient taking differently: Take 40 mg by mouth at bedtime. ) 30 tablet 6  . BRILINTA 90 MG TABS tablet TAKE 1 TABLET(90 MG) BY MOUTH TWICE DAILY 60 tablet 0  . levETIRAcetam (KEPPRA) 1000 MG tablet Take 1 tablet (1,000 mg total) by mouth 2 (two) times daily. 180 tablet 3  . lisinopril (ZESTRIL) 10 MG tablet Take 1 tablet (10 mg total) by mouth every evening. 90 tablet 3  . metoprolol succinate (TOPROL-XL) 50 MG 24 hr tablet Take 1 tablet (50 mg total) by mouth daily. Take with or immediately following a meal. 90 tablet 1   No current facility-administered medications on file prior to visit.    ALLERGIES: No Known Allergies  FAMILY HISTORY: Family History  Problem Relation Age of Onset  . Hypertension Mother     SOCIAL HISTORY: Social History   Socioeconomic History  . Marital status: Single    Spouse name: Not on file  . Number of children: Not on file  . Years of education: Not on file  . Highest education level: Not on file  Occupational History  . Occupation: Curator  Tobacco Use  . Smoking status: Current Every Day Smoker    Packs/day: 0.25    Years: 40.00    Pack years: 10.00    Types: Cigarettes  . Smokeless tobacco: Never Used  . Tobacco comment: Less than 1 pk per day  Vaping Use  . Vaping Use: Never used  Substance and Sexual Activity  . Alcohol use: No    Alcohol/week: 0.0 standard drinks    Comment: 6 pack/day (past)  . Drug use: Yes    Frequency: 1.0 times per week    Types: Marijuana    Comment: occasional  marijuana  . Sexual activity: Not Currently  Other Topics Concern  . Not on file  Social History Narrative   Lives alone   Used to work as a Air cabin crew bound.   Right leg amputated   Social Determinants of Health   Financial Resource Strain:   . Difficulty of Paying Living Expenses:   Food Insecurity:   . Worried About Programme researcher, broadcasting/film/video in the Last Year:   . Barista in the Last Year:   Transportation Needs:   . Freight forwarder (Medical):   Marland Kitchen Lack of Transportation (Non-Medical):   Physical Activity:   . Days of Exercise per Week:   . Minutes of Exercise per Session:   Stress:   . Feeling of Stress :   Social Connections:   . Frequency of Communication with Friends and Family:   . Frequency of Social Gatherings with Friends and  Family:   . Attends Religious Services:   . Active Member of Clubs or Organizations:   . Attends Banker Meetings:   Marland Kitchen Marital Status:   Intimate Partner Violence:   . Fear of Current or Ex-Partner:   . Emotionally Abused:   Marland Kitchen Physically Abused:   . Sexually Abused:     PHYSICAL EXAM: Vitals:   02/25/20 1127  BP: 137/81  Pulse: 69  Resp: 18  SpO2: 97%   General: No acute distress, sitting on wheelchair Head:  Normocephalic/atraumatic Skin/Extremities: No rash, no edema, s/p right BKA Neurological Exam: alert and oriented to person, place, and time. No aphasia or dysarthria. Fund of knowledge is appropriate.  Recent and remote memory are intact.  Attention and concentration are normal. Cranial nerves: Pupils equal, round, reactive to light.  Extraocular movements intact with no nystagmus. Visual fields full. No facial asymmetry. Motor: increased tone on right UE with spastic contracture at elbow. Muscle strength 4/5 on proximal right UE, spastic contracture distally, s/p right BKA, 5/5 on left UE and LE. Finger to nose testing intact on left.  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 He has been seizure-free since October 2020 on Levetiracetam 1000mg  BID, no side effects. Continue control of vascular risk factors for secondary stroke prevention, he is on aspirin and Brillinta prescribed by Cardiology. He is reporting right ear hearing loss and congestion, advised to try decongestant, and if no improvement, discuss ENT referral with PCP. He does not drive. Follow-up in 1 year, they know to call for any changes.   Thank you for allowing me to participate in his care.  Please do not hesitate to call for any questions or concerns.   , M.D.   CC: Patrcia Dolly, NP

## 2020-02-25 NOTE — Patient Instructions (Addendum)
1. Continue Keppra (Levetiracetam) 1000mg : take 1 tablet twice a day  2. Call your cardiologist Dr. for refills of your blood thinner: [phone] [name]  3. Try a decongestant for the right ear, if no change, let me or your PCP know so that referral can be sent to ENT  4. Follow-up in 1 year, call for any changes.  Seizure Precautions: 1. If medication has been prescribed for you to prevent seizures, take it exactly as directed.  Do not stop taking the medicine without talking to your doctor first, even if you have not had a seizure in a long time.   2. Avoid activities in which a seizure would cause danger to yourself or to others.  Don't operate dangerous machinery, swim alone, or climb in high or dangerous places, such as on ladders, roofs, or girders.  Do not drive unless your doctor says you may.  3. If you have any warning that you may have a seizure, lay down in a safe place where you can't hurt yourself.    4.  No driving for 6 months from last seizure, as per [facility].   Please refer to the following link on the Epilepsy Foundation of America's website for more information: http://www.epilepsyfoundation.org/answerplace/Social/driving/drivingu.cfm   5.  Maintain good sleep hygiene. Avoid alcohol.  6.  Contact your doctor if you have any problems that may be related to the medicine you are taking.  7.  Call 911 and bring the patient back to the ED if:        A.  The seizure lasts longer than 5 minutes.       B.  The patient doesn't awaken shortly after the seizure  C.  The patient has new problems such as difficulty seeing, speaking or moving  D.  The patient was injured during the seizure  E.  The patient has a temperature over 102 F (39C)  F.  The patient vomited and now is having trouble breathing

## 2020-02-27 ENCOUNTER — Other Ambulatory Visit (INDEPENDENT_AMBULATORY_CARE_PROVIDER_SITE_OTHER): Payer: Self-pay | Admitting: Primary Care

## 2020-02-27 DIAGNOSIS — E785 Hyperlipidemia, unspecified: Secondary | ICD-10-CM

## 2020-02-27 NOTE — Telephone Encounter (Signed)
Requested Prescriptions  Pending Prescriptions Disp Refills  . atorvastatin (LIPITOR) 40 MG tablet [Pharmacy Med Name: ATORVASTATIN 40MG  TABLETS] 30 tablet 6    Sig: TAKE 1 TABLET(40 MG) BY MOUTH DAILY     Cardiovascular:  Antilipid - Statins Failed - 02/27/2020  3:34 AM      Failed - HDL in normal range and within 360 days    HDL  Date Value Ref Range Status  09/23/2019 38 (L) >40 mg/dL Final  11/21/2019 35 (L) >39 mg/dL Final         Failed - Valid encounter within last 12 months    Recent Outpatient Visits          7 months ago Seizure [facility])   [facility], NP   1 year ago Seizure [facility])   [facility], NP   1 year ago Amputation stump pain (HCC)   [facility], PA-C   2 years ago Bilateral hearing loss due to cerumen impaction   [facility], NP   2 years ago Injury of left ankle, initial encounter   [facility], PA-C             Passed - Total Cholesterol in normal range and within 360 days    Cholesterol, Total  Date Value Ref Range Status  02/23/2019 109 100 - 199 mg/dL Final   Cholesterol  Date Value Ref Range Status  09/23/2019 117 0 - 200 mg/dL Final         Passed - LDL in normal range and within 360 days    LDL Calculated  Date Value Ref Range Status  02/23/2019 30 0 - 99 mg/dL Final   LDL Cholesterol  Date Value Ref Range Status  09/23/2019 66 0 - 99 mg/dL Final    Comment:           Total Cholesterol/HDL:CHD Risk Coronary Heart Disease Risk Table                     Men   Women  1/2 Average Risk   3.4   3.3  Average Risk       5.0   4.4  2 X Average Risk   9.6   7.1  3 X Average Risk  23.4   11.0        Use the calculated Patient Ratio above and the CHD Risk Table to determine the patient's CHD Risk.        ATP III CLASSIFICATION (LDL):   <100     mg/dL   Optimal  11/21/2019  mg/dL   Near or Above                    Optimal  130-159  mg/dL   Borderline  295-621  mg/dL   High  308-657     mg/dL   Very High Performed at [facility], [address] [place], [place]          Passed - Triglycerides in normal range and within 360 days    Triglycerides  Date Value Ref Range Status  09/23/2019 64 <150 mg/dL Final         Passed - Patient is not pregnant

## 2020-03-02 ENCOUNTER — Other Ambulatory Visit: Payer: Self-pay | Admitting: Cardiology

## 2020-03-28 ENCOUNTER — Other Ambulatory Visit: Payer: Self-pay | Admitting: Neurology

## 2020-03-28 ENCOUNTER — Other Ambulatory Visit: Payer: Self-pay | Admitting: Cardiology

## 2020-05-06 ENCOUNTER — Other Ambulatory Visit: Payer: Self-pay | Admitting: Cardiology

## 2020-08-16 ENCOUNTER — Other Ambulatory Visit: Payer: Self-pay | Admitting: Neurology

## 2020-11-22 ENCOUNTER — Other Ambulatory Visit: Payer: Self-pay | Admitting: Cardiology

## 2020-12-14 ENCOUNTER — Emergency Department (HOSPITAL_COMMUNITY): Payer: Medicare Other

## 2020-12-14 ENCOUNTER — Emergency Department (HOSPITAL_COMMUNITY)
Admission: EM | Admit: 2020-12-14 | Discharge: 2020-12-15 | Disposition: A | Discharge status: Home / Self Care | Payer: Medicare Other | Attending: Emergency Medicine | Admitting: Emergency Medicine

## 2020-12-14 DIAGNOSIS — Z79899 Other long term (current) drug therapy: Secondary | ICD-10-CM | POA: Diagnosis not present

## 2020-12-14 DIAGNOSIS — R0789 Other chest pain: Secondary | ICD-10-CM | POA: Diagnosis not present

## 2020-12-14 DIAGNOSIS — S0083XA Contusion of other part of head, initial encounter: Secondary | ICD-10-CM | POA: Diagnosis not present

## 2020-12-14 DIAGNOSIS — Z7982 Long term (current) use of aspirin: Secondary | ICD-10-CM | POA: Insufficient documentation

## 2020-12-14 DIAGNOSIS — F17210 Nicotine dependence, cigarettes, uncomplicated: Secondary | ICD-10-CM | POA: Diagnosis not present

## 2020-12-14 DIAGNOSIS — Z955 Presence of coronary angioplasty implant and graft: Secondary | ICD-10-CM | POA: Insufficient documentation

## 2020-12-14 DIAGNOSIS — Z20822 Contact with and (suspected) exposure to covid-19: Secondary | ICD-10-CM | POA: Insufficient documentation

## 2020-12-14 DIAGNOSIS — W050XXA Fall from non-moving wheelchair, initial encounter: Secondary | ICD-10-CM | POA: Diagnosis not present

## 2020-12-14 DIAGNOSIS — S0990XA Unspecified injury of head, initial encounter: Secondary | ICD-10-CM | POA: Diagnosis present

## 2020-12-14 DIAGNOSIS — E785 Hyperlipidemia, unspecified: Secondary | ICD-10-CM

## 2020-12-14 DIAGNOSIS — I10 Essential (primary) hypertension: Secondary | ICD-10-CM | POA: Diagnosis not present

## 2020-12-14 DIAGNOSIS — R569 Unspecified convulsions: Secondary | ICD-10-CM

## 2020-12-14 DIAGNOSIS — E1140 Type 2 diabetes mellitus with diabetic neuropathy, unspecified: Secondary | ICD-10-CM | POA: Insufficient documentation

## 2020-12-14 DIAGNOSIS — E1151 Type 2 diabetes mellitus with diabetic peripheral angiopathy without gangrene: Secondary | ICD-10-CM | POA: Diagnosis not present

## 2020-12-14 DIAGNOSIS — G40909 Epilepsy, unspecified, not intractable, without status epilepticus: Secondary | ICD-10-CM | POA: Insufficient documentation

## 2020-12-14 LAB — CBC
HCT: 47 % (ref 39.0–52.0)
Hemoglobin: 15.5 g/dL (ref 13.0–17.0)
MCH: 30.4 pg (ref 26.0–34.0)
MCHC: 33 g/dL (ref 30.0–36.0)
MCV: 92.2 fL (ref 80.0–100.0)
Platelets: 176 10*3/uL (ref 150–400)
RBC: 5.1 MIL/uL (ref 4.22–5.81)
RDW: 13.3 % (ref 11.5–15.5)
WBC: 14.2 10*3/uL — ABNORMAL HIGH (ref 4.0–10.5)
nRBC: 0 % (ref 0.0–0.2)

## 2020-12-14 LAB — PROTIME-INR
INR: 1.1 (ref 0.8–1.2)
Prothrombin Time: 14.5 seconds (ref 11.4–15.2)

## 2020-12-14 LAB — COMPREHENSIVE METABOLIC PANEL
ALT: 20 U/L (ref 0–44)
AST: 25 U/L (ref 15–41)
Albumin: 3.5 g/dL (ref 3.5–5.0)
Alkaline Phosphatase: 84 U/L (ref 38–126)
Anion gap: 16 — ABNORMAL HIGH (ref 5–15)
BUN: 10 mg/dL (ref 6–20)
CO2: 15 mmol/L — ABNORMAL LOW (ref 22–32)
Calcium: 9 mg/dL (ref 8.9–10.3)
Chloride: 105 mmol/L (ref 98–111)
Creatinine, Ser: 1.18 mg/dL (ref 0.61–1.24)
GFR, Estimated: >60 mL/min (ref >60)
Glucose, Bld: 185 mg/dL — ABNORMAL HIGH (ref 70–99)
Potassium: 4.3 mmol/L (ref 3.5–5.1)
Sodium: 136 mmol/L (ref 135–145)
Total Bilirubin: 1 mg/dL (ref 0.3–1.2)
Total Protein: 7.7 g/dL (ref 6.5–8.1)

## 2020-12-14 LAB — I-STAT CHEM 8, ED
BUN: 13 mg/dL (ref 6–20)
BUN: 13 mg/dL (ref 6–20)
Calcium, Ion: 1.06 mmol/L — ABNORMAL LOW (ref 1.15–1.40)
Calcium, Ion: 1.07 mmol/L — ABNORMAL LOW (ref 1.15–1.40)
Chloride: 107 mmol/L (ref 98–111)
Chloride: 107 mmol/L (ref 98–111)
Creatinine, Ser: 0.8 mg/dL (ref 0.61–1.24)
Creatinine, Ser: 0.9 mg/dL (ref 0.61–1.24)
Glucose, Bld: 188 mg/dL — ABNORMAL HIGH (ref 70–99)
Glucose, Bld: 189 mg/dL — ABNORMAL HIGH (ref 70–99)
HCT: 46 % (ref 39.0–52.0)
HCT: 47 % (ref 39.0–52.0)
Hemoglobin: 15.6 g/dL (ref 13.0–17.0)
Hemoglobin: 16 g/dL (ref 13.0–17.0)
Potassium: 4.5 mmol/L (ref 3.5–5.1)
Potassium: 4.6 mmol/L (ref 3.5–5.1)
Sodium: 138 mmol/L (ref 135–145)
Sodium: 138 mmol/L (ref 135–145)
TCO2: 17 mmol/L — ABNORMAL LOW (ref 22–32)
TCO2: 17 mmol/L — ABNORMAL LOW (ref 22–32)

## 2020-12-14 LAB — TROPONIN I (HIGH SENSITIVITY)
Troponin I (High Sensitivity): 96 ng/L — ABNORMAL HIGH (ref <18)
Troponin I (High Sensitivity): 111 ng/L (ref <18)

## 2020-12-14 LAB — URINALYSIS, ROUTINE W REFLEX MICROSCOPIC
Bilirubin Urine: NEGATIVE
Glucose, UA: NEGATIVE mg/dL
Hgb urine dipstick: NEGATIVE
Ketones, ur: 20 mg/dL — AB
Leukocytes,Ua: NEGATIVE
Nitrite: NEGATIVE
Protein, ur: 100 mg/dL — AB
Specific Gravity, Urine: 1.025 (ref 1.005–1.030)
pH: 5 (ref 5.0–8.0)

## 2020-12-14 LAB — LACTIC ACID, PLASMA: Lactic Acid, Venous: 5.1 mmol/L (ref 0.5–1.9)

## 2020-12-14 LAB — CBG MONITORING, ED: Glucose-Capillary: 200 mg/dL — ABNORMAL HIGH (ref 70–99)

## 2020-12-14 LAB — SAMPLE TO BLOOD BANK

## 2020-12-14 LAB — RESP PANEL BY RT-PCR (FLU A&B, COVID) ARPGX2
Influenza A by PCR: NEGATIVE
Influenza B by PCR: NEGATIVE
SARS Coronavirus 2 by RT PCR: NEGATIVE

## 2020-12-14 MED ORDER — LEVETIRACETAM 500 MG PO TABS
1000.0000 mg | ORAL_TABLET | Freq: Two times a day (BID) | ORAL | Status: DC
  Administered 2020-12-15 (×2): 1000 mg via ORAL
  Filled 2020-12-14 (×2): qty 2

## 2020-12-14 MED ORDER — TICAGRELOR 90 MG PO TABS
90.0000 mg | ORAL_TABLET | Freq: Two times a day (BID) | ORAL | Status: DC
  Administered 2020-12-15 (×2): 90 mg via ORAL
  Filled 2020-12-14 (×2): qty 1

## 2020-12-14 MED ORDER — ASPIRIN 81 MG PO CHEW
81.0000 mg | CHEWABLE_TABLET | Freq: Every day | ORAL | Status: DC
  Administered 2020-12-15: 81 mg via ORAL
  Filled 2020-12-14: qty 1

## 2020-12-14 MED ORDER — METOPROLOL SUCCINATE ER 25 MG PO TB24
50.0000 mg | ORAL_TABLET | Freq: Every day | ORAL | Status: DC
  Administered 2020-12-15: 50 mg via ORAL
  Filled 2020-12-14: qty 2

## 2020-12-14 MED ORDER — ATORVASTATIN CALCIUM 40 MG PO TABS
40.0000 mg | ORAL_TABLET | Freq: Every day | ORAL | Status: DC
  Administered 2020-12-15: 40 mg via ORAL
  Filled 2020-12-14: qty 1

## 2020-12-14 MED ORDER — LISINOPRIL 10 MG PO TABS
10.0000 mg | ORAL_TABLET | Freq: Every evening | ORAL | Status: DC
  Administered 2020-12-15: 10 mg via ORAL
  Filled 2020-12-14: qty 1

## 2020-12-14 MED ORDER — LEVETIRACETAM IN NACL 1000 MG/100ML IV SOLN
1000.0000 mg | INTRAVENOUS | Status: AC
  Administered 2020-12-14 (×2): 1000 mg via INTRAVENOUS
  Filled 2020-12-14 (×2): qty 100

## 2020-12-14 MED ORDER — SODIUM CHLORIDE 0.9 % IV SOLN: 2000.0000 mg | Freq: Once | INTRAVENOUS | Status: DC

## 2020-12-14 NOTE — ED Provider Notes (Signed)
  Physical Exam  BP 115/82   Pulse 85   Temp 97.7 F (36.5 C) (Oral)   Resp (!) 28   Ht 6\' 2"  (1.88 m)   Wt 99.8 kg   SpO2 94%   BMI 28.25 kg/m   Physical Exam  ED Course/Procedures     Procedures  MDM   Received patient in signout.  Had seizure.  Confused initially.  Right-sided hematoma on Brilinta.  Head CT reassuring.  As patient became more aware stated that he had been off his seizure medicines for a year now.  Had initially consulted transitions of care to help with medication assistance.  However patient was even during the leave.  However the patient's neighbor that helps take care of him stated that he needs placement and cannot live at home alone.  Patient states he does fine but thinks he may need help.  Patient states his sister helps him but his sister has been unable to take him to the doctor for the last year.  We will have transitional care consult for evaluation about placement versus extra help at home.      , MD 12/14/20 2137

## 2020-12-14 NOTE — ED Provider Notes (Signed)
[facility] EMERGENCY DEPARTMENT Provider Note   CSN: [id number] Arrival date & time: 12/14/20  1424     History Chief Complaint  Patient presents with  . Seizures  . seizure/ level 2    Ronald Forbes is a 60 y.o. male.  History limited due to altered mental status, acuity.  Level 5 caveat.  Per report, patient had witnessed seizure causing fall from wheelchair and large abrasion/hematoma to right scalp.  Patient takes Brilinta per review of chart.  History of stroke, chronic right upper extremity contraction.    HPI     Past Medical History:  Diagnosis Date  . Alcohol use disorder   . Cerebral vascular accident (HCC)   . 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 Diabetes mellitus type 2 in obese (HCC)   . DVT (deep venous thrombosis) (HCC) 01/25/2017  . HLD (hyperlipidemia)   . Morbid obesity (HCC)   . PAD (peripheral artery disease) (HCC) 10/20/2016  . Pulmonary embolism (HCC) 07/11/2017  . Seizures (HCC)   . Stroke (HCC)   . Tobacco abuse     Patient Active Problem List   Diagnosis Date Noted  . Acute MI, inferolateral wall, initial episode of care (HCC) 09/23/2019  . Acute ST elevation myocardial infarction (STEMI) of inferolateral wall (HCC) 09/23/2019  . Impacted cerumen of both ears 02/15/2018  . 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 Pressure injury of skin 07/12/2017  . Acute pulmonary embolism (HCC) 07/11/2017  . Alcohol abuse 07/11/2017  . Pulmonary embolism (HCC) 07/11/2017  . Seizure (HCC) 01/25/2017  . Depression 01/25/2017  . DVT (deep venous thrombosis) (HCC) 01/25/2017  . Seizures (HCC) 01/25/2017  . Hemiparesis affecting right side as late effect of cerebrovascular accident (CVA) (HCC) 12/07/2016  . Type 2 diabetes mellitus with diabetic peripheral angiopathy without gangrene, without long-term current use of insulin (HCC)   . Spastic hemiparesis of right  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   . History of CVA with residual deficit   . Phantom limb pain (HCC)   . 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 Unilateral AKA, right (HCC)   . Diabetic peripheral neuropathy (HCC)   . Diabetes mellitus type 2 in nonobese (HCC)   . Reactive hypertension   . Type 2 diabetes mellitus with peripheral neuropathy (HCC)   . Benign essential HTN   . Hypokalemia   . Hypoalbuminemia due to protein-calorie malnutrition (HCC)   . Acute blood loss anemia   . Debilitated 10/28/2016  . Ischemia of right lower extremity 10/25/2016  . PAD (peripheral artery disease) (HCC) 10/20/2016  . Thyroid nodule 02/29/2016  . 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 Cerebrovascular accident (CVA) due to embolism of left posterior cerebral artery (HCC)   . Diabetes mellitus type 2 in obese (HCC) 02/07/2016  . HLD (hyperlipidemia)   . Tobacco abuse 02/04/2016  . Alcohol use disorder 02/04/2016  . Morbid obesity (HCC) 02/04/2016  . CVA (cerebral vascular accident) (HCC) 02/04/2016    Past Surgical History:  Procedure Laterality Date  . ABDOMINAL AORTOGRAM W/LOWER EXTREMITY Right 10/20/2016   Procedure: Abdominal Aortogram w/Lower Extremity;  Surgeon: Maeola Harman, MD;  Location: [facility] INVASIVE CV LAB;  Service: Cardiovascular;  Laterality: Right;  lower leg  . AMPUTATION Right 11/15/2016   Procedure: AMPUTATION ABOVE KNEE;  Surgeon: Sherren Kerns, MD;  Location: [facility] OR;  Service: Vascular;  Laterality: Right;  . BUBBLE  STUDY  09/25/2019   Procedure: BUBBLE STUDY;  Surgeon: [name], MD;  Location: [facility] ENDOSCOPY;  Service: Cardiovascular;;  . CORONARY/GRAFT ACUTE MI REVASCULARIZATION N/A 09/23/2019   Procedure: Coronary/Graft Acute MI Revascularization;  Surgeon: [name], MD;  Location: [facility] INVASIVE CV LAB;  Service: Cardiovascular;  Laterality: N/A;  . EP IMPLANTABLE DEVICE N/A 02/09/2016   Procedure: Loop Recorder Insertion;  Surgeon: Duke Salvia, MD;  Location: [facility] INVASIVE CV LAB;  Service: Cardiovascular;  Laterality: N/A;  . FEMORAL-TIBIAL BYPASS GRAFT Right 10/25/2016   Procedure: BYPASS GRAFT FEMORAL-TIBIAL ARTERY USING NON REVERSED SAPPHENOUS VEIN;  Surgeon: Sherren Kerns, MD;  Location: [facility] OR;  Service: Vascular;  Laterality: Right;  . INTRAOPERATIVE ARTERIOGRAM Right 10/25/2016   Procedure: INTRA OPERATIVE ARTERIOGRAM;  Surgeon: Sherren Kerns, MD;  Location: [facility] OR;  Service: Vascular;  Laterality: Right;  . LEFT HEART CATH AND CORONARY ANGIOGRAPHY N/A 09/23/2019   Procedure: LEFT HEART CATH AND CORONARY ANGIOGRAPHY;  Surgeon: [name], MD;  Location: [facility] INVASIVE CV LAB;  Service: Cardiovascular;  Laterality: N/A;  . TEE WITHOUT CARDIOVERSION N/A 02/06/2016   Procedure: TRANSESOPHAGEAL ECHOCARDIOGRAM (TEE);  Surgeon: Wendall Stade, MD;  Location: [facility] ENDOSCOPY;  Service: Cardiovascular;  Laterality: N/A;  . TEE WITHOUT CARDIOVERSION N/A 09/25/2019   Procedure: TRANSESOPHAGEAL ECHOCARDIOGRAM (TEE);  Surgeon: [name], MD;  Location: [facility] ENDOSCOPY;  Service: Cardiovascular;  Laterality: N/A;       Family History  Problem Relation Age of Onset  . Hypertension Mother     Social History   Tobacco Use  . Smoking status: Current Every Day Smoker    Packs/day: 0.25    Years: 40.00    Pack years: 10.00    Types: Cigarettes  . Smokeless tobacco: Never Used  . Tobacco comment: Less than 1 pk per day  Vaping Use  . Vaping Use: Never used  Substance Use Topics  . Alcohol use: No    Alcohol/week: 0.0 standard drinks    Comment: 6 pack/day (past)  . Drug use: Yes    Frequency: 1.0 times per week    Types: Marijuana    Comment: occasional marijuana    Home Medications Prior to Admission medications   Medication Sig Start Date End Date Taking? Authorizing Provider  aspirin 81 MG chewable tablet Chew 1 tablet (81 mg total) by mouth daily. 09/25/19   [name], MD  atorvastatin (LIPITOR) 40 MG tablet TAKE 1 TABLET(40  MG) BY MOUTH DAILY 02/27/20   Grayce Sessions, NP  BRILINTA 90 MG TABS tablet TAKE 1 TABLET(90 MG) BY MOUTH TWICE DAILY 05/06/20   [name], MD  levETIRAcetam (KEPPRA) 1000 MG tablet Take 1 tablet (1,000 mg total) by mouth 2 (two) times daily. 02/25/20   Van Clines, MD  lisinopril (ZESTRIL) 10 MG tablet Take 1 tablet (10 mg total) by mouth every evening. 09/25/19   [name], MD  metoprolol succinate (TOPROL-XL) 50 MG 24 hr tablet TAKE 1 TABLET(50 MG) BY MOUTH DAILY WITH OR IMMEDIATELY FOLLOWING A MEAL 11/24/20   [name], MD    Allergies    Patient has no known allergies.  Review of Systems   Review of Systems  Unable to perform ROS: Mental status change    Physical Exam Updated Vital Signs BP (!) 144/86   Pulse 94   Resp (!) 30   Ht 6\' 2"  (1.88 m)   Wt 99.8 kg   SpO2 94%   BMI 28.25 kg/m   Physical Exam  Vitals and nursing note reviewed.  Constitutional:      Appearance: He is well-developed.  HENT:     Head: Normocephalic.     Comments: Right forehead superficial abrasion and hematoma, no significant laceration, no active bleeding Eyes:     Conjunctiva/sclera: Conjunctivae normal.  Neck:     Comments: C-collar intact Cardiovascular:     Rate and Rhythm: Normal rate and regular rhythm.     Heart sounds: No murmur heard.   Pulmonary:     Effort: Pulmonary effort is normal. No respiratory distress.     Breath sounds: Normal breath sounds.  Abdominal:     Palpations: Abdomen is soft.     Tenderness: There is no abdominal tenderness.  Musculoskeletal:     Comments: Chronic right upper extremity contraction, right leg AKA, no tenderness to palpation throughout  Skin:    General: Skin is warm and dry.  Neurological:     Mental Status: He is alert.     Comments: Alert, oriented to person but mildly confused, chronic right-sided weakness, follows commands     ED Results / Procedures / Treatments   Labs (all labs ordered are listed, but only abnormal  results are displayed) Labs Reviewed  CBG MONITORING, ED - Abnormal; Notable for the following components:      Result Value   Glucose-Capillary 200 (*)    All other components within normal limits  I-STAT CHEM 8, ED - Abnormal; Notable for the following components:   Glucose, Bld 189 (*)    Calcium, Ion 1.07 (*)    TCO2 17 (*)    All other components within normal limits  I-STAT CHEM 8, ED - Abnormal; Notable for the following components:   Glucose, Bld 188 (*)    Calcium, Ion 1.06 (*)    TCO2 17 (*)    All other components within normal limits  RESP PANEL BY RT-PCR (FLU A&B, COVID) ARPGX2  COMPREHENSIVE METABOLIC PANEL  CBC  ETHANOL  URINALYSIS, ROUTINE W REFLEX MICROSCOPIC  LACTIC ACID, PLASMA  PROTIME-INR  BLOOD GAS, VENOUS  SAMPLE TO BLOOD BANK  TROPONIN I (HIGH SENSITIVITY)    EKG None  Radiology No results found.  Procedures Procedures   Medications Ordered in ED Medications - No data to display  ED Course  I have reviewed the triage vital signs and the nursing notes.  Pertinent labs & imaging results that were available during my care of the patient were reviewed by me and considered in my medical decision making (see chart for details).    MDM Rules/Calculators/A&P                         60 year old male with extensive past medical history including seizures, stroke, heart disease on Brilinta presented to ER after seizure episode and head trauma.  On my initial evaluation, patient noted to be mildly confused but alert, large hematoma to his right forehead.  No active bleeding.  No other identifiable trauma on exam.  Given fall, confusion and on antiplatelet agent, called level 2 trauma alert.  Confusion may also be related to seizure, postictal state.  While awaiting work-up, reassessment and observation, patient signed out to Dr. Rubin PayorPickering.    Final Clinical Impression(s) / ED Diagnoses Final diagnoses:  Chest discomfort  Seizure-like activity (HCC)   Traumatic injury of head, initial encounter    Rx / DC Orders ED Discharge Orders    None       Briant Angelillo,  [place] Livings, MD 12/14/20 [phone]

## 2020-12-14 NOTE — ED Notes (Signed)
Pt to CT at this time.

## 2020-12-14 NOTE — ED Triage Notes (Signed)
Pt BIBA from home after a witnessed seizure causing patient to fall out of wheelchair causing an large abrasion to the right scalp. Bleeding controlled. Patient on blood thinners. Pt is poor historian. Pt chronically contracted on right upper extremity. Pt's speech is slurred, unknown if it is at baseline.

## 2020-12-14 NOTE — ED Notes (Signed)
Received verbal report from Mykenzie C. RN 

## 2020-12-15 ENCOUNTER — Other Ambulatory Visit: Payer: Self-pay

## 2020-12-15 DIAGNOSIS — S0083XA Contusion of other part of head, initial encounter: Secondary | ICD-10-CM | POA: Diagnosis not present

## 2020-12-15 MED ORDER — ATORVASTATIN CALCIUM 40 MG PO TABS: ORAL_TABLET | ORAL | 1 refills | Status: AC

## 2020-12-15 MED ORDER — METOPROLOL SUCCINATE ER 50 MG PO TB24: ORAL_TABLET | ORAL | 1 refills | Status: AC

## 2020-12-15 MED ORDER — ASPIRIN 81 MG PO CHEW: 81.0000 mg | CHEWABLE_TABLET | Freq: Every day | ORAL | 3 refills | Status: AC

## 2020-12-15 MED ORDER — LEVETIRACETAM 1000 MG PO TABS: 1000.0000 mg | ORAL_TABLET | Freq: Two times a day (BID) | ORAL | 3 refills | Status: AC

## 2020-12-15 MED ORDER — TICAGRELOR 90 MG PO TABS: ORAL_TABLET | ORAL | 0 refills | Status: AC

## 2020-12-15 MED ORDER — LISINOPRIL 10 MG PO TABS: 10.0000 mg | ORAL_TABLET | Freq: Every evening | ORAL | 3 refills | Status: AC

## 2020-12-15 NOTE — Evaluation (Signed)
Physical Therapy Evaluation Patient Details Name: Ronald Forbes MRN: [id number] DOB: 20-Feb-1961 Today's Date: 12/15/2020   History of Present Illness  Pt is a 60 y/o male presenting to the ED on 5/1 secondary to seizure activity with fall out of WC. CT negative for acute abnormality. PMH includes CVA with RUE deficits, R AKA, DM, PAD, PE.  Clinical Impression  Pt admitted secondary to problem above with deficits below. Pt agitated during PT session that he was not discharged yet. Very short responses during session. Required supervision for bed mobility and supervision to perform AP transfer along length of stretcher. Pt could not sequence to perform lateral scoots and did not have safe chair to transfer to during session in ED. Pt very adamant about returning home and refusing SNF. Feel he would benefit from HHPT/OT/RN follow up at d/c, however, may refuse that as well. Will continue to follow acutely.     Follow Up Recommendations Home health PT;Supervision/Assistance - 24 hour (Pt adamantly refusing SNF and may refuse HHPT. Would benefit from HHOT/HHRN as well.)    Equipment Recommendations  None recommended by PT    Recommendations for Other Services       Precautions / Restrictions Precautions Precautions: Fall Precaution Comments: R AKA at baseline Restrictions Weight Bearing Restrictions: No      Mobility  Bed Mobility Overal bed mobility: Needs Assistance Bed Mobility: Supine to Sit;Sit to Supine     Supine to sit: Supervision Sit to supine: Supervision   General bed mobility comments: Supervision for safety to sit at EOB. Required use of rails on stretcher to help with trunk elevation.    Transfers Overall transfer level: Needs assistance Equipment used: None Transfers: Licensed conveyancer transfers: Supervision   General transfer comment: Had pt perform AP transfers along length of stretcher. Supervision for safety. Was unable  to sequence to perform lateral transfers along EOB. Educated about how to perform AP transfers at home if needed. There was not a safe chair in ED room to practice squat pivot transfers.  Ambulation/Gait                Stairs            Wheelchair Mobility    Modified Rankin (Stroke Patients Only)       Balance Overall balance assessment: Needs assistance Sitting-balance support: No upper extremity supported;Feet supported Sitting balance-Leahy Scale: Fair                                       Pertinent Vitals/Pain Pain Assessment: No/denies pain    Home Living Family/patient expects to be discharged to:: Private residence Living Arrangements: Other relatives (sister) Available Help at Discharge: Family;Available 24 hours/day Type of Home: House Home Access: Level entry     Home Layout: One level Home Equipment: Bedside commode;Shower seat;Wheelchair - manual      Prior Function Level of Independence: Needs assistance   Gait / Transfers Assistance Needed: Reports independence with transfers to [facility]. Uses WC for primary mobility.  ADL's / Homemaking Assistance Needed: Needs assist with med management. Reports he normally is able to take a bath by himself but unsure of accuracy.        Hand Dominance        Extremity/Trunk Assessment   Upper Extremity Assessment Upper Extremity Assessment: RUE deficits/detail RUE Deficits / Details: RUE weakness  at baseline. Limited ROM into elbow extension and limited ROM at shoulder.    Lower Extremity Assessment Lower Extremity Assessment: RLE deficits/detail RLE Deficits / Details: R AKA at baseline    Cervical / Trunk Assessment Cervical / Trunk Assessment: Normal  Communication   Communication: No difficulties  Cognition Arousal/Alertness: Awake/alert Behavior During Therapy: Flat affect;Impulsive;Agitated Overall Cognitive Status: No family/caregiver present to determine baseline  cognitive functioning                                 General Comments: Pt very agitated with PT presence as he was wanting to go home. Very loud responses to questions at times. Decreased safety awareness and impairments with sequencing as well.      General Comments General comments (skin integrity, edema, etc.): Pt very adamant about return home. Did not want to hear about SNF and will likely refuse HHPT.    Exercises     Assessment/Plan    PT Assessment Patient needs continued PT services  PT Problem List Decreased strength;Decreased balance;Decreased mobility;Decreased knowledge of use of DME;Decreased knowledge of precautions;Decreased safety awareness;Decreased cognition;Decreased activity tolerance       PT Treatment Interventions DME instruction;Therapeutic activities;Functional mobility training;Therapeutic exercise;Neuromuscular re-education;Balance training;Wheelchair mobility training;Patient/family education    PT Goals (Current goals can be found in the Care Plan section)  Acute Rehab PT Goals Patient Stated Goal: to go home PT Goal Formulation: With patient Time For Goal Achievement: 12/29/20 Potential to Achieve Goals: Fair    Frequency Min 3X/week   Barriers to discharge        Co-evaluation               AM-PAC PT "6 Clicks" Mobility  Outcome Measure Help needed turning from your back to your side while in a flat bed without using bedrails?: None Help needed moving from lying on your back to sitting on the side of a flat bed without using bedrails?: A Little Help needed moving to and from a bed to a chair (including a wheelchair)?: A Little Help needed standing up from a chair using your arms (e.g., wheelchair or bedside chair)?: Total Help needed to walk in hospital room?: Total Help needed climbing 3-5 steps with a railing? : Total 6 Click Score: 13    End of Session   Activity Tolerance: Treatment limited secondary to  agitation Patient left: in bed;with call bell/phone within reach (on stretcher in ED) Nurse Communication: Mobility status PT Visit Diagnosis: Other abnormalities of gait and mobility (R26.89);Difficulty in walking, not elsewhere classified (R26.2)    Time: 9702-6378 PT Time Calculation (min) (ACUTE ONLY): 14 min   Charges:   PT Evaluation $PT Eval Moderate Complexity: 1 Mod          Farley Ly, PT, DPT  Acute Rehabilitation Services  Pager: [phone] Office: [phone]   Lehman Prom 12/15/2020, 11:48 AM

## 2020-12-15 NOTE — ED Notes (Signed)
Pt attempting to leave treatment area, this RN and primary RN Anise Salvo have attempted to coach pt to return to bed, pt in wheelchair and wheeling since from treatment area. Pt wheeled back to treatment area by this RN, pt unable to verbalize understanding. Pt screaming and yelling at staff at this time, security at bedside.

## 2020-12-15 NOTE — Discharge Planning (Signed)
RNCM had Rx transferred to [facility] for delivery and provided Medicaid transportation information to enroll in for appointments.

## 2020-12-15 NOTE — ED Notes (Signed)
Dinner tray ordered.

## 2020-12-15 NOTE — ED Notes (Signed)
Pt states he wants to go home. This RN discussed placement vs home health and patient states he does not care and he just wants to go home. Discussed this information with TOC team, Carollee Herter, CSW.

## 2020-12-15 NOTE — Discharge Planning (Signed)
RNCM has recommended that this patient have Home Health Services but pt declines at this time. I have discussed the benefits of home health services as wll as the risks of not having home health services with pt. RNCM informed patient that if he changed his mind, his primary care physician can order home health from office.The patient verbalizes understanding. No further RNCM identified need for Rx delivery to his home.   PT agreed to have Rx changed to pharmacy that delivers.

## 2020-12-15 NOTE — Progress Notes (Signed)
CSW spoke with patient about receiving Home Health services. CSW introduced herself to patient and he starred at CSW and stated AND. CSW explained a referral for home health and if he was interested. Patient turned his head to the wall and refused to speak with CSW. CSW again repeated are you interested in a referral for home health services. CSW waited for a response and patient continued to look at the wall. CSW stated she would inform the doctor that he is refusing services and not speaking. Patient continued to look at the wall and not speak.

## 2020-12-15 NOTE — Discharge Instructions (Signed)
All you medication was sent to [facility]

## 2020-12-15 NOTE — ED Notes (Signed)
Medicaid transport assistance was attempted with no return call. Secretary contacted [facility] for this pt at this RN request.

## 2020-12-15 NOTE — ED Notes (Signed)
Attempted to contacts pts sister to confirm she will be home when pt arrives. No answer. Pt is sure that sister will be home when [facility] comes.

## 2020-12-15 NOTE — ED Notes (Signed)
Pt was given a [place] sandwich & a snack since his wait for [facility] is still ongoing.

## 2020-12-15 NOTE — Discharge Planning (Signed)
RNCM consulted regarding safe discharge planning (Home with Home Health vs Skilled Nursing Facility Placement).  Physical Therapy evaluation placed; will follow up after recommendations from PT.     

## 2020-12-25 ENCOUNTER — Telehealth (INDEPENDENT_AMBULATORY_CARE_PROVIDER_SITE_OTHER): Payer: Medicare Other | Admitting: Primary Care

## 2021-01-23 ENCOUNTER — Emergency Department (HOSPITAL_COMMUNITY)
Admission: EM | Admit: 2021-01-23 | Discharge: 2021-01-24 | Disposition: A | Discharge status: Home / Self Care | Payer: Medicare Other | Attending: Emergency Medicine | Admitting: Emergency Medicine

## 2021-01-23 ENCOUNTER — Emergency Department (HOSPITAL_COMMUNITY): Payer: Medicare Other

## 2021-01-23 ENCOUNTER — Other Ambulatory Visit: Payer: Self-pay

## 2021-01-23 DIAGNOSIS — U071 COVID-19: Secondary | ICD-10-CM | POA: Diagnosis not present

## 2021-01-23 DIAGNOSIS — I10 Essential (primary) hypertension: Secondary | ICD-10-CM | POA: Diagnosis not present

## 2021-01-23 DIAGNOSIS — Z7982 Long term (current) use of aspirin: Secondary | ICD-10-CM | POA: Diagnosis not present

## 2021-01-23 DIAGNOSIS — E0590 Thyrotoxicosis, unspecified without thyrotoxic crisis or storm: Secondary | ICD-10-CM

## 2021-01-23 DIAGNOSIS — E1151 Type 2 diabetes mellitus with diabetic peripheral angiopathy without gangrene: Secondary | ICD-10-CM | POA: Diagnosis not present

## 2021-01-23 DIAGNOSIS — R0602 Shortness of breath: Secondary | ICD-10-CM | POA: Diagnosis present

## 2021-01-23 DIAGNOSIS — Z79899 Other long term (current) drug therapy: Secondary | ICD-10-CM | POA: Diagnosis not present

## 2021-01-23 DIAGNOSIS — F17210 Nicotine dependence, cigarettes, uncomplicated: Secondary | ICD-10-CM | POA: Insufficient documentation

## 2021-01-23 LAB — URINALYSIS, ROUTINE W REFLEX MICROSCOPIC
Bacteria, UA: NONE SEEN
Bilirubin Urine: NEGATIVE
Glucose, UA: NEGATIVE mg/dL
Hgb urine dipstick: NEGATIVE
Ketones, ur: NEGATIVE mg/dL
Leukocytes,Ua: NEGATIVE
Nitrite: NEGATIVE
Protein, ur: 30 mg/dL — AB
Specific Gravity, Urine: 1.021 (ref 1.005–1.030)
pH: 5 (ref 5.0–8.0)

## 2021-01-23 LAB — RESP PANEL BY RT-PCR (FLU A&B, COVID) ARPGX2
Influenza A by PCR: NEGATIVE
Influenza B by PCR: NEGATIVE
SARS Coronavirus 2 by RT PCR: POSITIVE — AB

## 2021-01-23 LAB — COMPREHENSIVE METABOLIC PANEL
ALT: 64 U/L — ABNORMAL HIGH (ref 0–44)
AST: 39 U/L (ref 15–41)
Albumin: 3.8 g/dL (ref 3.5–5.0)
Alkaline Phosphatase: 100 U/L (ref 38–126)
Anion gap: 10 (ref 5–15)
BUN: 35 mg/dL — ABNORMAL HIGH (ref 6–20)
CO2: 16 mmol/L — ABNORMAL LOW (ref 22–32)
Calcium: 8.9 mg/dL (ref 8.9–10.3)
Chloride: 106 mmol/L (ref 98–111)
Creatinine, Ser: 1.31 mg/dL — ABNORMAL HIGH (ref 0.61–1.24)
GFR, Estimated: >60 mL/min (ref >60)
Glucose, Bld: 169 mg/dL — ABNORMAL HIGH (ref 70–99)
Potassium: 4.6 mmol/L (ref 3.5–5.1)
Sodium: 132 mmol/L — ABNORMAL LOW (ref 135–145)
Total Bilirubin: 1.1 mg/dL (ref 0.3–1.2)
Total Protein: 7.8 g/dL (ref 6.5–8.1)

## 2021-01-23 LAB — CBC WITH DIFFERENTIAL/PLATELET
Abs Immature Granulocytes: 0.04 10*3/uL (ref 0.00–0.07)
Basophils Absolute: 0 10*3/uL (ref 0.0–0.1)
Basophils Relative: 0 %
Eosinophils Absolute: 0 10*3/uL (ref 0.0–0.5)
Eosinophils Relative: 0 %
HCT: 44.5 % (ref 39.0–52.0)
Hemoglobin: 16 g/dL (ref 13.0–17.0)
Immature Granulocytes: 1 %
Lymphocytes Relative: 16 %
Lymphs Abs: 1.3 10*3/uL (ref 0.7–4.0)
MCH: 30 pg (ref 26.0–34.0)
MCHC: 36 g/dL (ref 30.0–36.0)
MCV: 83.5 fL (ref 80.0–100.0)
Monocytes Absolute: 0.5 10*3/uL (ref 0.1–1.0)
Monocytes Relative: 6 %
Neutro Abs: 6.3 10*3/uL (ref 1.7–7.7)
Neutrophils Relative %: 77 %
Platelets: 108 10*3/uL — ABNORMAL LOW (ref 150–400)
RBC: 5.33 MIL/uL (ref 4.22–5.81)
RDW: 13.7 % (ref 11.5–15.5)
WBC: 8.7 10*3/uL (ref 4.0–10.5)
nRBC: 0 % (ref 0.0–0.2)

## 2021-01-23 LAB — ETHANOL: Alcohol, Ethyl (B): <10 mg/dL (ref <10)

## 2021-01-23 LAB — MAGNESIUM: Magnesium: 1.9 mg/dL (ref 1.7–2.4)

## 2021-01-23 LAB — TSH: TSH: <0.01 u[IU]/mL — ABNORMAL LOW (ref 0.350–4.500)

## 2021-01-23 LAB — VITAMIN B12: Vitamin B-12: 1493 pg/mL — ABNORMAL HIGH (ref 180–914)

## 2021-01-23 LAB — TROPONIN I (HIGH SENSITIVITY): Troponin I (High Sensitivity): 14 ng/L (ref <18)

## 2021-01-23 MED ORDER — ONDANSETRON HCL 4 MG PO TABS: 4.0000 mg | ORAL_TABLET | Freq: Four times a day (QID) | ORAL | 0 refills | Status: AC

## 2021-01-23 MED ORDER — SODIUM CHLORIDE 0.9 % IV BOLUS
1000.0000 mL | Freq: Once | INTRAVENOUS | Status: AC
  Administered 2021-01-23: 1000 mL via INTRAVENOUS

## 2021-01-23 MED ORDER — ONDANSETRON HCL 4 MG/2ML IJ SOLN: 4.0000 mg | INTRAMUSCULAR | Status: AC

## 2021-01-23 MED ORDER — SODIUM CHLORIDE 0.9 % IV SOLN: Freq: Once | INTRAVENOUS | Status: AC

## 2021-01-23 NOTE — ED Triage Notes (Signed)
Pt arrives from home via EMS. Pt SOB with N/V. Last emesis today at 1200. Poor historian. Brought in NRB, clammy. Took pt off NRB on arrival and SpO2 97% RA

## 2021-01-23 NOTE — ED Provider Notes (Addendum)
[facility] EMERGENCY DEPARTMENT Provider Note   CSN: [id number] Arrival date & time: 01/23/21  1617     History Chief Complaint  Patient presents with   Shortness of Breath    BIBA from home with increasing SOB over past month     Ronald Forbes is a 60 y.o. male.   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  Ronald Forbes is also known to have heavy alcohol use.  Ronald Forbes presents to the hospital today with a complaint of nausea although I suspect there is more to the story the patient is not willing to give much more history.  The paramedics reported that Ronald Forbes had been nauseated, progressively weak and the family members eventually called to have him brought to the hospital.  The patient states Ronald Forbes did not want to come but is willing to have a work-up.  Ronald Forbes does endorse some marijuana use but states that Ronald Forbes wants to stop smoking, Ronald Forbes wants to stop drinking, Ronald Forbes does not take any prescription medications according to Ronald Forbes report  The symptoms of been persistent, gradually worsening and not associated with diarrhea or blood in the stools     Past Medical History:  Diagnosis Date   Alcohol use disorder    Cerebral vascular accident [facility])    Cerebrovascular accident (CVA) due to thrombosis of left posterior cerebral artery (HCC)    Diabetes mellitus type 2 in obese [facility])    DVT (deep venous thrombosis) (HCC) 01/25/2017   HLD (hyperlipidemia)    Morbid obesity (HCC)    PAD (peripheral artery disease) (HCC) 10/20/2016   Pulmonary embolism (HCC) 07/11/2017   Seizures (HCC)    Stroke (HCC)    Tobacco abuse     Patient Active Problem List   Diagnosis Date Noted   Acute MI, inferolateral wall, initial episode of care (HCC) 09/23/2019   Acute ST  elevation myocardial infarction (STEMI) of inferolateral wall (HCC) 09/23/2019   Impacted cerumen of both ears 02/15/2018   Sensorineural hearing loss (SNHL), bilateral 02/15/2018   Pressure injury of skin 07/12/2017   Acute pulmonary embolism (HCC) 07/11/2017   Alcohol abuse 07/11/2017   Pulmonary embolism (HCC) 07/11/2017   Seizure (HCC) 01/25/2017   Depression 01/25/2017   DVT (deep venous thrombosis) (HCC) 01/25/2017   Seizures (HCC) 01/25/2017   Hemiparesis affecting right side as late effect of cerebrovascular accident (CVA) (HCC) 12/07/2016   Type 2 diabetes mellitus with diabetic peripheral angiopathy without gangrene, without long-term current use of insulin (HCC)    Spastic hemiparesis of right 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    History of CVA with residual deficit    Phantom limb pain (HCC)    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)    Unilateral AKA, right (HCC)    Diabetic peripheral neuropathy (HCC)    Diabetes mellitus type 2 in nonobese (HCC)    Reactive hypertension    Type 2 diabetes mellitus with peripheral neuropathy (HCC)    Benign essential HTN    Hypokalemia    Hypoalbuminemia due to protein-calorie malnutrition (HCC)    Acute blood loss anemia    Debilitated 10/28/2016   Ischemia of right lower extremity 10/25/2016   PAD (peripheral artery disease) (HCC) 10/20/2016   Thyroid nodule 02/29/2016  Elevated total protein 02/29/2016   Cerebrovascular accident (CVA) due to embolism of left posterior cerebral artery (HCC)    Diabetes mellitus type 2 in obese (HCC) 02/07/2016   HLD (hyperlipidemia)    Tobacco abuse 02/04/2016   Alcohol use disorder 02/04/2016   Morbid obesity (HCC) 02/04/2016   CVA (cerebral vascular accident) (HCC) 02/04/2016    Past Surgical History:  Procedure Laterality Date   ABDOMINAL AORTOGRAM W/LOWER EXTREMITY Right 10/20/2016   Procedure: Abdominal Aortogram w/Lower Extremity;  Surgeon: Maeola Harman, MD;  Location: [facility] INVASIVE CV LAB;  Service: Cardiovascular;  Laterality: Right;  lower leg   AMPUTATION Right 11/15/2016   Procedure: AMPUTATION ABOVE KNEE;  Surgeon: Sherren Kerns, MD;  Location: [facility] OR;  Service: Vascular;  Laterality: Right;   BUBBLE STUDY  09/25/2019   Procedure: BUBBLE STUDY;  Surgeon: [name], MD;  Location: [facility] ENDOSCOPY;  Service: Cardiovascular;;   CORONARY/GRAFT ACUTE MI REVASCULARIZATION N/A 09/23/2019   Procedure: Coronary/Graft Acute MI Revascularization;  Surgeon: [name], MD;  Location: [facility] INVASIVE CV LAB;  Service: Cardiovascular;  Laterality: N/A;   EP IMPLANTABLE DEVICE N/A 02/09/2016   Procedure: Loop Recorder Insertion;  Surgeon: Duke Salvia, MD;  Location: [facility] INVASIVE CV LAB;  Service: Cardiovascular;  Laterality: N/A;   FEMORAL-TIBIAL BYPASS GRAFT Right 10/25/2016   Procedure: BYPASS GRAFT FEMORAL-TIBIAL ARTERY USING NON REVERSED SAPPHENOUS VEIN;  Surgeon: Sherren Kerns, MD;  Location: [facility] OR;  Service: Vascular;  Laterality: Right;   INTRAOPERATIVE ARTERIOGRAM Right 10/25/2016   Procedure: INTRA OPERATIVE ARTERIOGRAM;  Surgeon: Sherren Kerns, MD;  Location: [facility] OR;  Service: Vascular;  Laterality: Right;   LEFT HEART CATH AND CORONARY ANGIOGRAPHY N/A 09/23/2019   Procedure: LEFT HEART CATH AND CORONARY ANGIOGRAPHY;  Surgeon: [name], MD;  Location: [facility] INVASIVE CV LAB;  Service: Cardiovascular;  Laterality: N/A;   TEE WITHOUT CARDIOVERSION N/A 02/06/2016   Procedure: TRANSESOPHAGEAL ECHOCARDIOGRAM (TEE);  Surgeon: Wendall Stade, MD;  Location: [facility] ENDOSCOPY;  Service: Cardiovascular;  Laterality: N/A;   TEE WITHOUT CARDIOVERSION N/A 09/25/2019   Procedure: TRANSESOPHAGEAL ECHOCARDIOGRAM (TEE);  Surgeon: [name], MD;  Location: [facility] ENDOSCOPY;  Service: Cardiovascular;  Laterality: N/A;       Family History  Problem Relation Age of Onset   Hypertension Mother     Social History   Tobacco Use   Smoking status: Every Day    Packs/day:  0.25    Years: 40.00    Pack years: 10.00    Types: Cigarettes   Smokeless tobacco: Never   Tobacco comments:    Less than 1 pk per day  Vaping Use   Vaping Use: Never used  Substance Use Topics   Alcohol use: No    Alcohol/week: 0.0 standard drinks    Comment: 6 pack/day (past)   Drug use: Yes    Frequency: 1.0 times per week    Types: Marijuana    Comment: occasional marijuana    Home Medications Prior to Admission medications   Medication Sig Start Date End Date Taking? Authorizing Provider  ondansetron (ZOFRAN) 4 MG tablet Take 1 tablet (4 mg total) by mouth every 6 (six) hours. 01/23/21  Yes Eber Hong, MD  aspirin 81 MG chewable tablet Chew 1 tablet (81 mg total) by mouth daily. 12/15/20   Gwyneth Sprout, MD  atorvastatin (LIPITOR) 40 MG tablet TAKE 1 TABLET(40 MG) BY MOUTH DAILY 12/15/20   Gwyneth Sprout, MD  levETIRAcetam (KEPPRA) 1000 MG tablet Take 1 tablet (1,000 mg  total) by mouth 2 (two) times daily. 12/15/20   Gwyneth Sprout, MD  lisinopril (ZESTRIL) 10 MG tablet Take 1 tablet (10 mg total) by mouth every evening. 12/15/20   Gwyneth Sprout, MD  metoprolol succinate (TOPROL-XL) 50 MG 24 hr tablet TAKE 1 TABLET(50 MG) BY MOUTH DAILY WITH OR IMMEDIATELY FOLLOWING A MEAL 12/15/20   Gwyneth Sprout, MD  ticagrelor (BRILINTA) 90 MG TABS tablet TAKE 1 TABLET(90 MG) BY MOUTH TWICE DAILY 12/15/20   Gwyneth Sprout, MD    Allergies    Patient has no known allergies.  Review of Systems   Review of Systems  Respiratory:  Positive for shortness of breath.   All other systems reviewed and are negative.  Physical Exam Updated Vital Signs BP 122/87   Pulse 77   Temp 98.5 F (36.9 C) (Oral)   Resp (!) 24   Ht 1.88 m (6\' 2" )   Wt 99.8 kg   SpO2 95%   BMI 28.25 kg/m   Physical Exam Vitals and nursing note reviewed.  Constitutional:      General: Ronald Forbes is not in acute distress.    Appearance: Ronald Forbes is well-developed.  HENT:     Head: Normocephalic and atraumatic.      Nose: No congestion or rhinorrhea.     Mouth/Throat:     Mouth: Mucous membranes are dry.     Pharynx: No oropharyngeal exudate.     Comments: Mucous membranes appear slightly dry, there is no exudate in the throat, the teeth are in generally poor care and poor state of health, there is no trismus torticollis or signs of periodontal abscesses Eyes:     General: No scleral icterus.       Right eye: No discharge.        Left eye: No discharge.     Conjunctiva/sclera: Conjunctivae normal.     Pupils: Pupils are equal, round, and reactive to light.  Neck:     Thyroid: No thyromegaly.     Vascular: No JVD.     Comments: The patient is neck is very supple, there is no JVD, Ronald Forbes right clavicle seems more prominent than the left Cardiovascular:     Rate and Rhythm: Normal rate and regular rhythm.     Heart sounds: Normal heart sounds. No murmur heard.   No friction rub. No gallop.     Comments: Heart sounds are normal, no murmurs, Ronald Forbes is not tachycardic, Ronald Forbes has weak peripheral pulses Pulmonary:     Effort: Pulmonary effort is normal. No respiratory distress.     Breath sounds: Normal breath sounds. No wheezing or rales.  Abdominal:     General: Bowel sounds are normal. There is no distension.     Palpations: Abdomen is soft. There is no mass.     Tenderness: There is no abdominal tenderness.     Comments: There is no abdominal tenderness to palpation  Musculoskeletal:        General: No tenderness. Normal range of motion.     Cervical back: Normal range of motion and neck supple.     Right lower leg: No edema.     Left lower leg: No edema.     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  Skin:    General: Skin is warm and dry.  Findings: No erythema or rash.  Neurological:     Mental Status: Ronald Forbes is  alert.     Cranial Nerves: No cranial nerve deficit.     Coordination: Coordination normal.     Comments: The patient is able to assist with sitting up in the bed, I do not see any signs of skin breakdown on Ronald Forbes back, Ronald Forbes has generalized diffuse weakness but is able to answer my questions, Ronald Forbes seems annoyed and has had for Ronald Forbes answers to my questions but answers seem appropriate  Psychiatric:        Behavior: Behavior normal.    ED Results / Procedures / Treatments   Labs (all labs ordered are listed, but only abnormal results are displayed) Labs Reviewed  RESP PANEL BY RT-PCR (FLU A&B, COVID) ARPGX2 - Abnormal; Notable for the following components:      Result Value   SARS Coronavirus 2 by RT PCR POSITIVE (*)    All other components within normal limits  TSH - Abnormal; Notable for the following components:   TSH <0.010 (*)    All other components within normal limits  URINALYSIS, ROUTINE W REFLEX MICROSCOPIC - Abnormal; Notable for the following components:   APPearance HAZY (*)    Protein, ur 30 (*)    All other components within normal limits  CBC WITH DIFFERENTIAL/PLATELET - Abnormal; Notable for the following components:   Platelets 108 (*)    All other components within normal limits  VITAMIN B12 - Abnormal; Notable for the following components:   Vitamin B-12 1,493 (*)    All other components within normal limits  COMPREHENSIVE METABOLIC PANEL - Abnormal; Notable for the following components:   Sodium 132 (*)    CO2 16 (*)    Glucose, Bld 169 (*)    BUN 35 (*)    Creatinine, Ser 1.31 (*)    ALT 64 (*)    All other components within normal limits  ETHANOL  MAGNESIUM  VITAMIN B1  RAPID URINE DRUG SCREEN, HOSP PERFORMED  TROPONIN I (HIGH SENSITIVITY)    EKG EKG Interpretation  Date/Time:  Friday January 23 2021 16:21:12 EDT Ventricular Rate:  84 PR Interval:  162 QRS Duration: 106 QT Interval:  383 QTC Calculation: 453 R Axis:   267 Text Interpretation: Sinus  rhythm Right ventricular hypertrophy Probable inferior infarct, recent Abnormal lateral Q waves ST depression V1-V3, suggest recording posterior leads compared with 12/14/20, rate slower, Q waves persist, no significant changes Confirmed by Eber Hong ([id number]) on 01/23/2021 4:24:22 PM  Radiology DG Chest Port 1 View  Result Date: 01/23/2021 CLINICAL DATA:  Cough, weakness and weight loss. EXAM: PORTABLE CHEST 1 VIEW COMPARISON:  12/14/2020 FINDINGS: Normal sized heart. Clear lungs. Interval minimally elevated left hemidiaphragm. Mild lower thoracic spine degenerative changes. IMPRESSION: No acute abnormality. Electronically Signed   By: Beckie Salts M.D.   On: 01/23/2021 17:38    Procedures Procedures   Medications Ordered in ED Medications  ondansetron (ZOFRAN) injection 4 mg (4 mg Intravenous Not Given 01/23/21 1931)  sodium chloride 0.9 % bolus 1,000 mL (0 mLs Intravenous Stopped 01/23/21 2035)    ED Course  I have reviewed the triage vital signs and the nursing notes.  Pertinent labs & imaging results that were available during my care of the patient were reviewed by me and considered in my medical decision making (see chart for details).    MDM Rules/Calculators/A&P  The patient's EKG is unchanged, Ronald Forbes has some abnormal ST segments which appear on older EKGs as well.  Ronald Forbes has generally poor state of health and appears to have lost a significant amount of weight based on the excess skin around Ronald Forbes abdomen.  Ronald Forbes has not been taking medications, it is hard to know what to make as Ronald Forbes does not have anything for diabetes on Ronald Forbes list, Ronald Forbes does have cholesterol medications and blood pressure medications but obviously is not taking them according to Ronald Forbes report.  Ronald Forbes is not febrile, Ronald Forbes is not hypotensive, Ronald Forbes is not hypoxic.  Ronald Forbes lung sounds are clear.  Ronald Forbes will need a work-up for further evaluation  The patient's results show that Ronald Forbes urinalysis is negative for infection, Ronald Forbes  thyroid test shows that it is TSH virtually undetectable, this could be consistent with hyperthyroidism however the patient is not tachycardic febrile, is not in atrial fibrillation or hyperstimulated.  Ronald Forbes will need close follow-up in the outpatient setting.  Electrolytes reviewed and show that the patient does have some slight increase in Ronald Forbes creatinine, sodium is 132, troponin is normal, CBC is normal except for thrombocytopenia which is chronic and Ronald Forbes alcohol level was undetectable.  Ronald Forbes is not hallucinating, Ronald Forbes is calm, Ronald Forbes is not tachycardic or tremulous or diaphoretic at this time.  Ronald Forbes has no findings of COVID on x-ray.  I have attempted to call Ronald Forbes sister with whom Ronald Forbes lives and nobody is answering the phone.  We have attempted to get a hold of family at home but nobody is answering, the patient is not safe to go by transport home where there is nobody to help care for him.  Ronald Forbes will be here tonight in the emergency department, Dr. Jacqulyn BathLong is aware of this patient, Ronald Forbes appears hemodynamically stable, is getting IV fluids, Ronald Forbes is on COVID precautions, Ronald Forbes is aware of thyroid abnormalities and I have included this on Ronald Forbes paperwork.  Social work to help get the patient home in the morning  Final Clinical Impression(s) / ED Diagnoses Final diagnoses:  COVID-19    Rx / DC Orders ED Discharge Orders          Ordered    ondansetron (ZOFRAN) 4 MG tablet  Every 6 hours        01/23/21 2250             Eber HongMiller, Mirabella Hilario, MD 01/23/21 Lamar [id number]    Eber HongMiller, Ajay Strubel, MD 01/23/21 2259

## 2021-01-23 NOTE — ED Notes (Signed)
Multiple attempts made to call family to dc pt, with no respond. SW consult placed to get pt home.

## 2021-01-23 NOTE — ED Notes (Signed)
Pt oriented to place and self only

## 2021-01-23 NOTE — Discharge Instructions (Addendum)
Your testing shows that you have COVID-19, please continue to take your daily medications exactly as prescribed, it appears that you have had symptoms for many days so I hope that you are on the mend, and thankfully you do not have any signs of pneumonia on the x-ray.  Your blood work was reassuring, please make sure that you are taking plenty of clear liquids such as water or Gatorade, Zofran as needed for nausea, seek medical exam for severe or worsening symptoms\  You will need to consider yourself to be contagious to others for another 5 days so please quarantine at home.  Your testing does show that your thyroid is not working well, you must follow-up with your family doctor for thyroid evaluation.  This can be a serious problem if it continues to go untreated.  Please call in the morning to make this appointment

## 2021-01-24 DIAGNOSIS — U071 COVID-19: Secondary | ICD-10-CM | POA: Diagnosis not present

## 2021-01-24 NOTE — ED Notes (Signed)
Patient contact on file have been called multiple time and no answer.

## 2021-01-24 NOTE — ED Notes (Signed)
Called sister [name], no answer and no option to leave a message. Called both numbers for brother Michelle Piper with no answer, left message on first number listed. Called sister in law Windell Moulding, no answer, left voicemail.

## 2021-01-24 NOTE — Care Management (Addendum)
Attempted to call numbers listed , unable to get through message states unable to leave message. [facility] PD for well check of apartment.   [phone] PD called the number they received the initial call for  for patient and the sister will pick the patient up. Sarah RN notified via secure chat  [phone] called PD back- have not heard from family to pick up patient. Thye gave me a number to call Charisse Klinefelter [phone] Bria she had said she was calling someone to pick him up. When I called this number it went straight to voice mail. I left a confidential message. To call this CM back

## 2021-01-24 NOTE — ED Notes (Signed)
Ordered a tray 

## 2021-02-06 LAB — VITAMIN B1: Vitamin B1 (Thiamine): 68 nmol/L (ref 66.5–200.0)

## 2021-02-25 ENCOUNTER — Ambulatory Visit: Payer: Medicare Other | Admitting: Neurology

## 2021-03-16 DEATH — deceased

## 2021-03-24 ENCOUNTER — Telehealth (INDEPENDENT_AMBULATORY_CARE_PROVIDER_SITE_OTHER): Payer: Self-pay

## 2021-03-24 NOTE — Telephone Encounter (Signed)
Good afternoon Dr. Alvis Lemmings, Al[facility]is [facility]house and [facility] with be sending the death certificate under your name so that it can appear in your [facility] account. Please allow a few minutes for the adjustments to be made.  Thank you

## 2021-03-24 NOTE — Telephone Encounter (Signed)
Can you please have them send a paper copy?  I do not have access to Michelle's inbox on [place] [employer] and when I try to search the patient under my account he does not show up. Thanks

## 2021-03-24 NOTE — Telephone Encounter (Signed)
Caller name: Nadine Counts  Relation to pt: from [employer] Call back number: [phone]   Reason for call:  Checking on the status of death certificate, stating deceased has to be cremated and nothing can be done until PCP signs certificate. Caller would like a follow up call as soon as possible

## 2021-03-24 NOTE — Telephone Encounter (Signed)
Copied from CRM [id number]. Topic: General - Other >> Mar 24, 2021 10:14 AM Gwenlyn Fudge wrote: Reason for CRM: Everardo All, from [employer] and [facility], calling stating that they found pt deceased on 03-Apr-2021. He states that the death certificate is uploaded on Ronald Forbes and that  PCP is just needing to sign.   Please advise.

## 2021-03-24 NOTE — Telephone Encounter (Signed)
Can you please review the thread from this conversation including my response and convey the message to the funeral home?  Thank you.

## 2023-02-21 IMAGING — CT CT CERVICAL SPINE W/O CM
3 of 4 series · 13 of 33 positions shown, 16 images · non-contrast
Comparison: None.

CLINICAL DATA: Seizure, fall from wheelchair

EXAM:
CT CERVICAL SPINE WITHOUT CONTRAST
TECHNIQUE: Multidetector CT imaging of the cervical spine was performed without
intravenous contrast. Multiplanar CT image reconstructions were also
generated.

[Series 4: c_spine 2.0 st · axial · 0.35mm/px · z∈[-306,-168]mm · 5 of 105 slices shown, 7 images]
[im 18/105  soft-tissue]
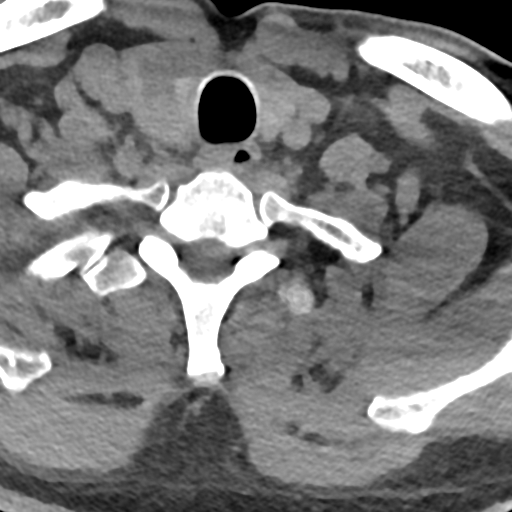
[im 18/105  bone]
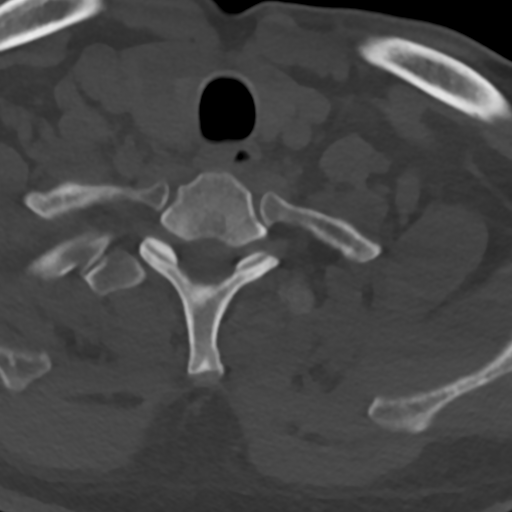
[im 35/105  bone]
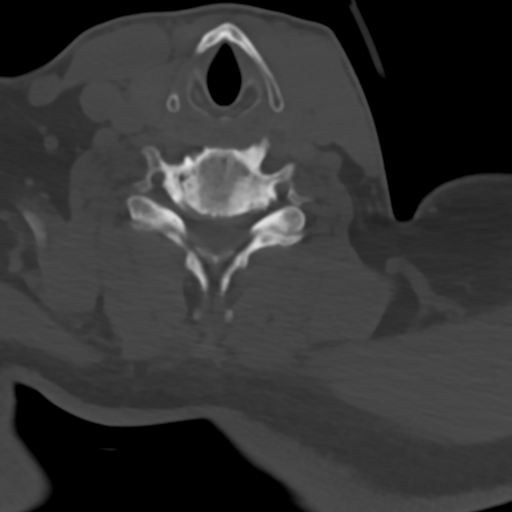
[im 53/105  bone]
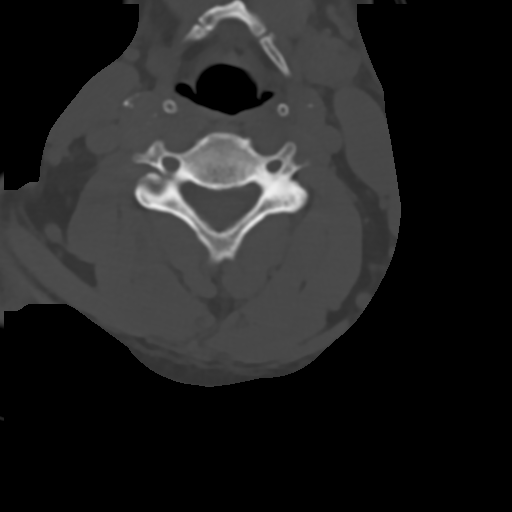
[im 70/105  bone]
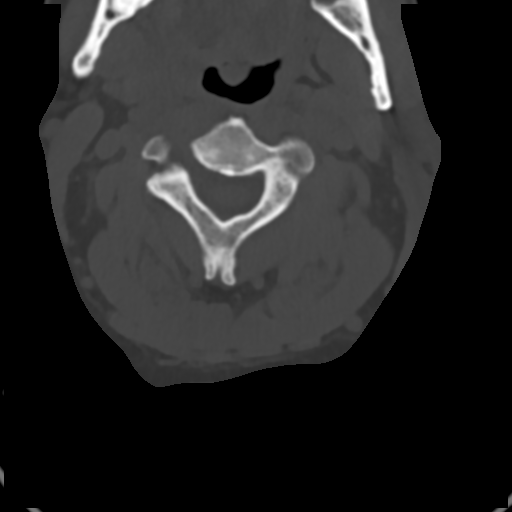
[im 87/105  soft-tissue]
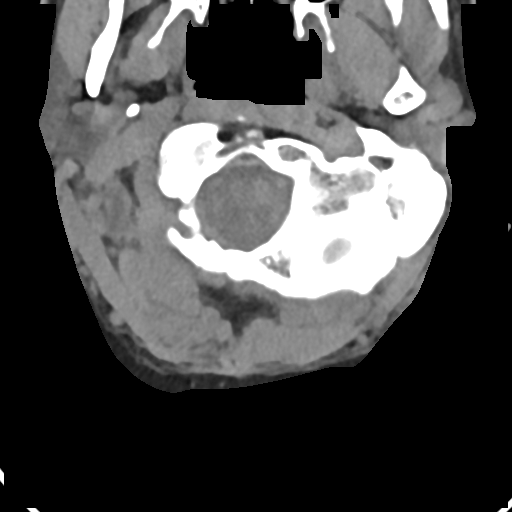
[im 87/105  bone]
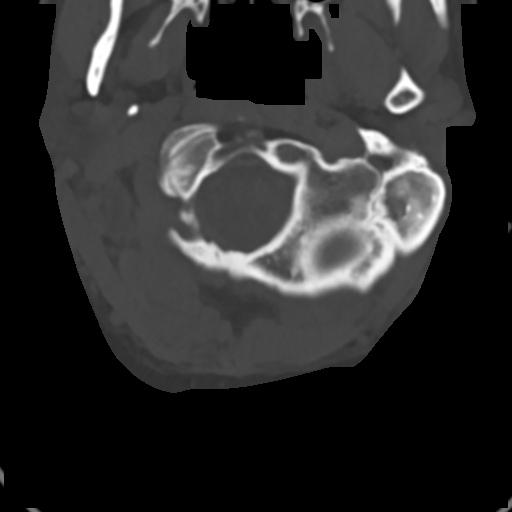

[Series 8: c_spine 2.0 sag bone · sagittal · 0.30mm/px · 5 of 61 slices shown, 6 images]
[im 21/61  bone]
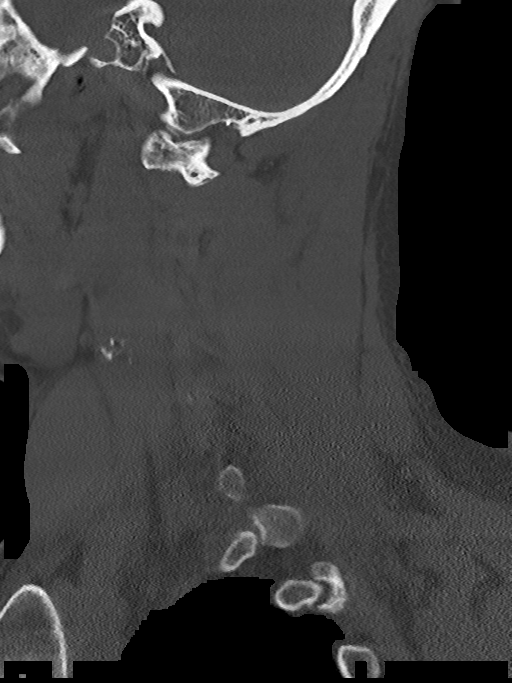
[im 26/61  bone]
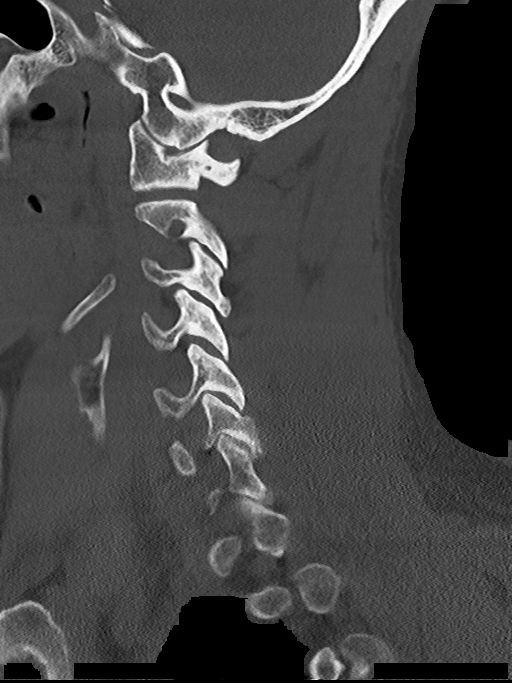
[im 31/61  soft-tissue]
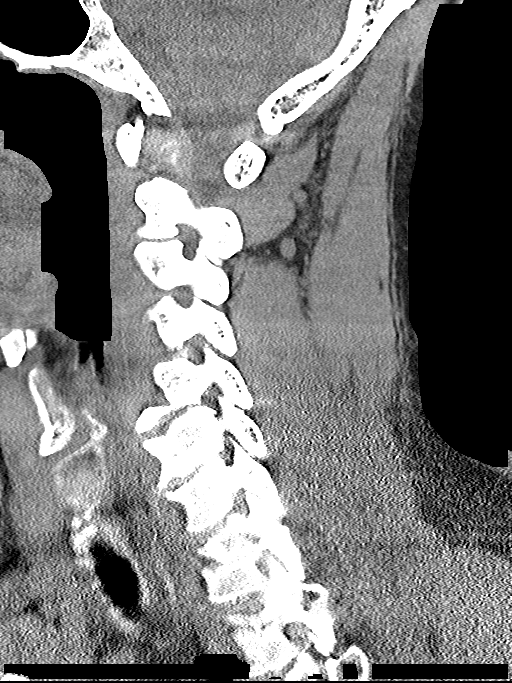
[im 31/61  bone]
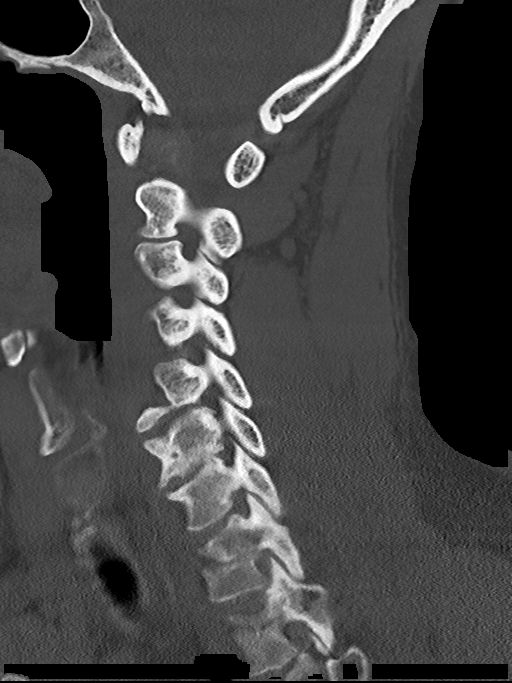
[im 36/61  bone]
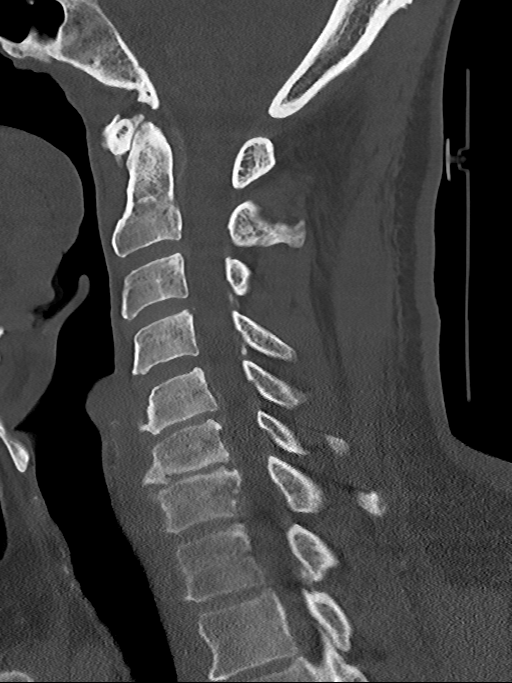
[im 41/61  bone]
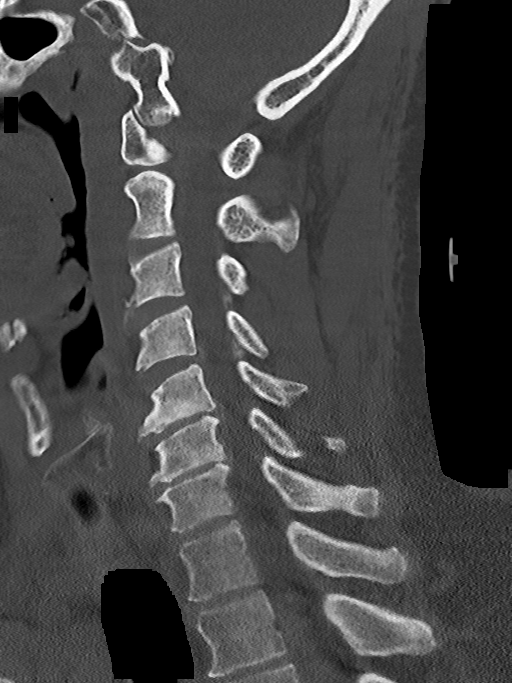

[Series 9: c_spine 2.0 cor bone · coronal · 0.30mm/px · 3 of 61 slices shown]
[im 13/61  bone]
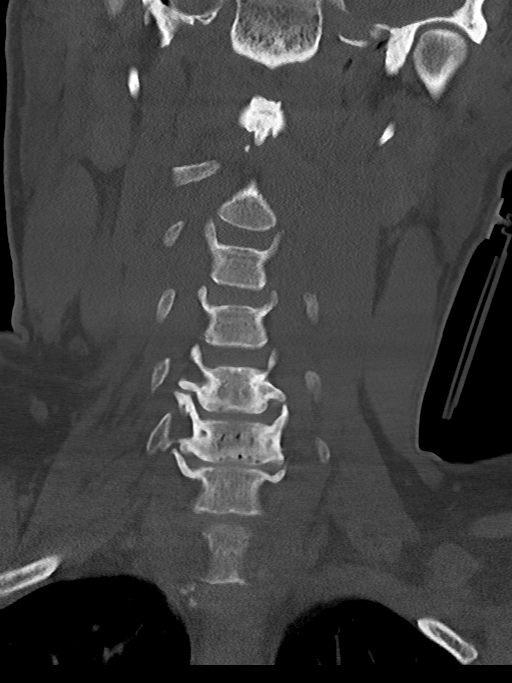
[im 25/61  bone]
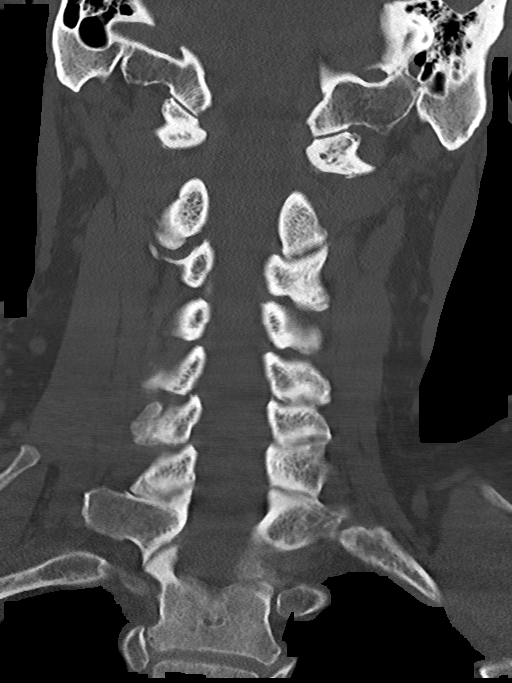
[im 37/61  bone]
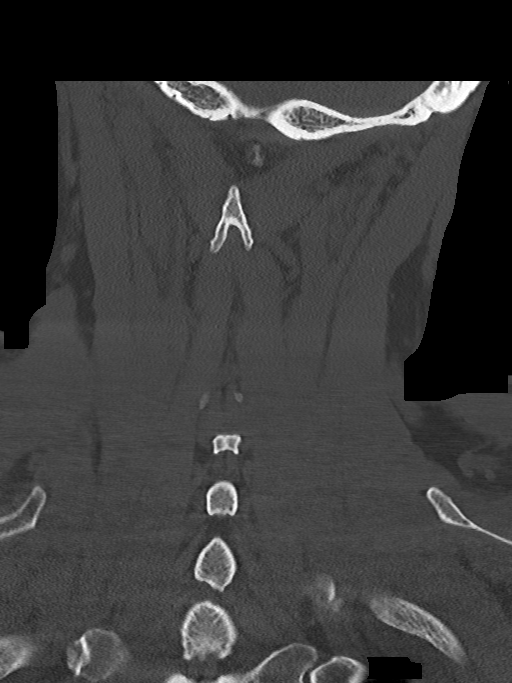

[13 of 33 positions shown; findings below may reference images not displayed]

FINDINGS: Alignment: No significant anteroposterior listhesis.

Skull base and vertebrae: No acute cervical spine fracture.
Vertebral body heights are maintained.

Soft tissues and spinal canal: No prevertebral fluid or swelling. No
visible canal hematoma.

Disc levels: Multilevel degenerative changes are present including
disc space narrowing, endplate osteophytes, and facet and
uncovertebral hypertrophy. These findings are greatest at C5-C6 and
C6-C7.

Upper chest: Included lung apices are clear.

Other: Enlarged, mildly heterogeneous right thyroid is poorly
evaluated due to artifact. Mild calcified plaque at the common
carotid bifurcations.
IMPRESSION: No acute cervical spine fracture.

Degenerative changes, greatest at C5-C6 and C6-C7.

Enlarged, mildly heterogeneous right thyroid. Nonemergent ultrasound
evaluation is recommended by current guidelines.

## 2023-02-21 IMAGING — CT CT HEAD W/O CM
4 series · 16 of 47 positions shown, 18 images · non-contrast
Comparison: 10/26/2018

CLINICAL DATA: Seizure, fall out of wheelchair

EXAM:
CT HEAD WITHOUT CONTRAST
TECHNIQUE: Contiguous axial images were obtained from the base of the skull
through the vertex without intravenous contrast.

[Series 3: head without · axial · non-contrast · 0.46mm/px · z∈[-129,+1]mm · 7 of 36 slices shown, 9 images]
[im 5/36  brain]
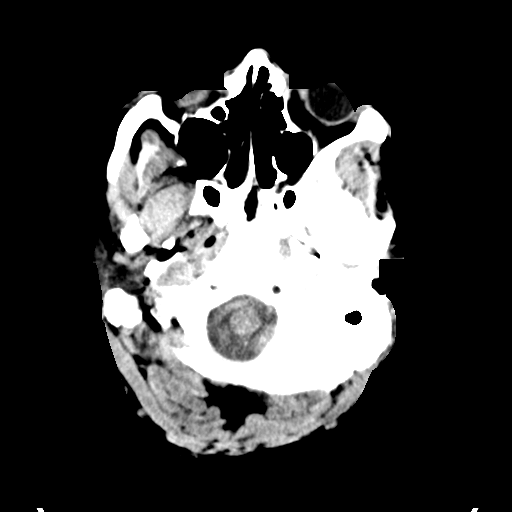
[im 5/36  bone]
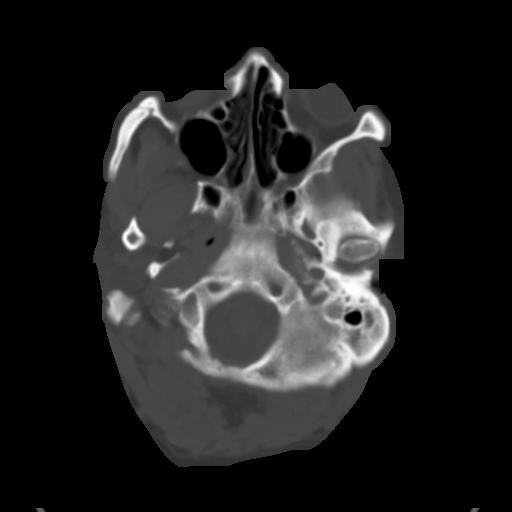
[im 9/36  brain]
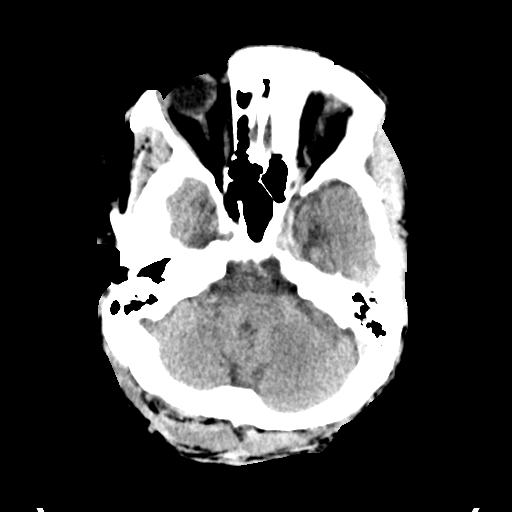
[im 14/36  brain]
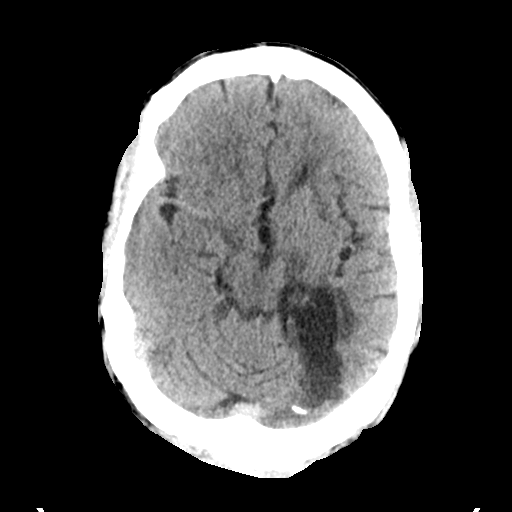
[im 18/36  brain]
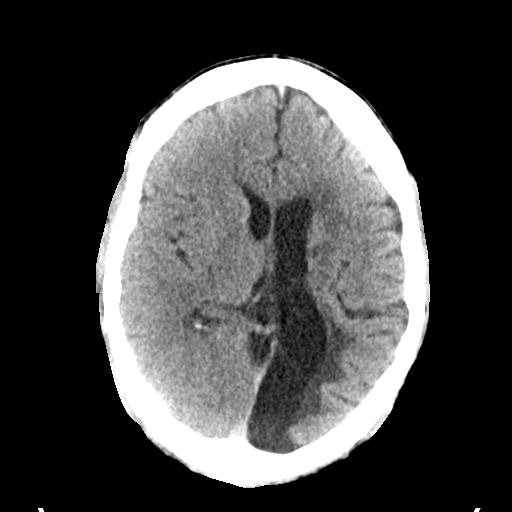
[im 22/36  brain]
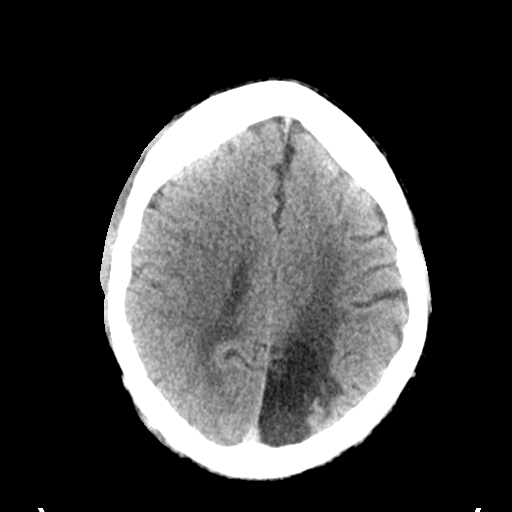
[im 22/36  bone]
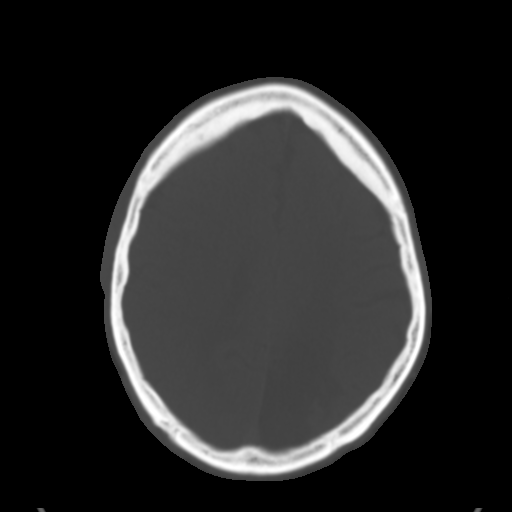
[im 27/36  brain]
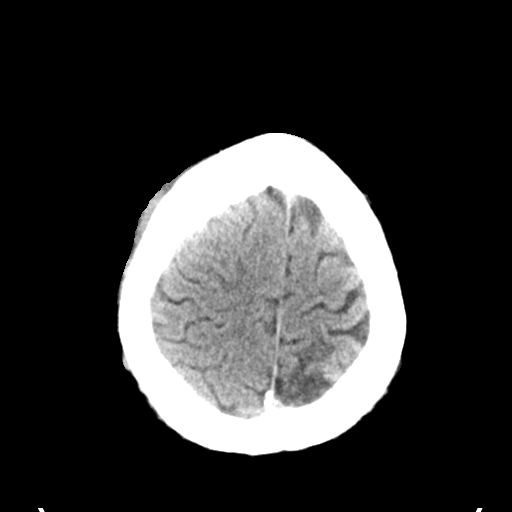
[im 31/36  brain]
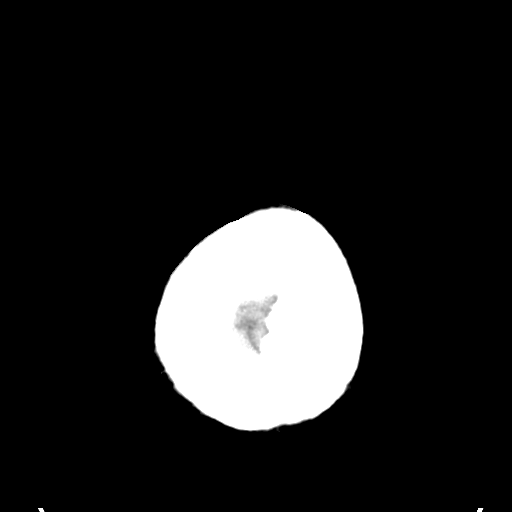

[Series 4: head bone · axial · 0.46mm/px · z∈[-133,-97]mm · 3 of 90 slices shown]
[im 9/90  bone]
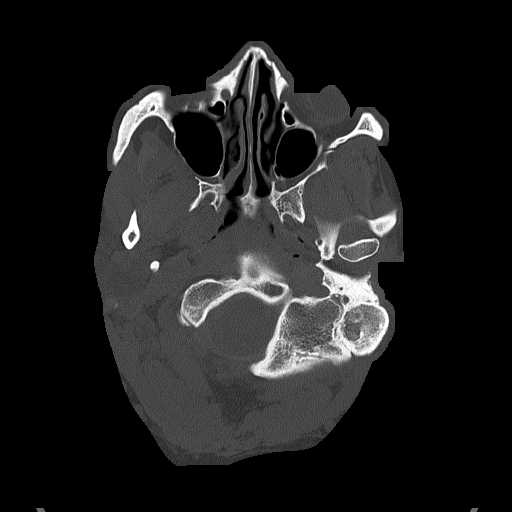
[im 18/90  bone]
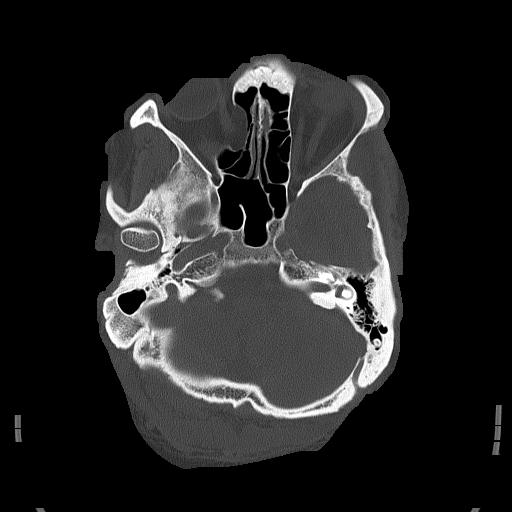
[im 27/90  bone]
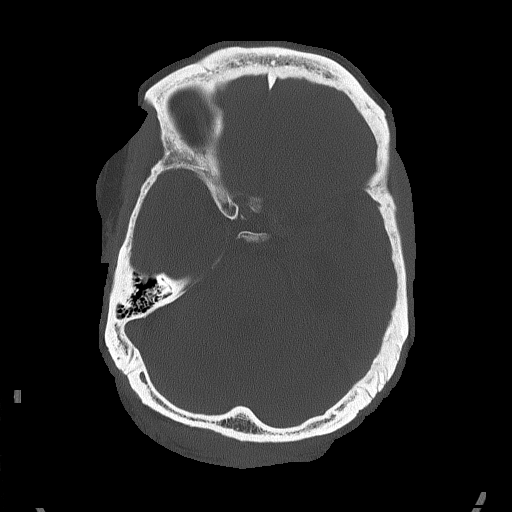

[Series 5: head without cor · coronal · non-contrast · 0.35mm/px · 3 of 77 slices shown]
[im 26/77  brain]
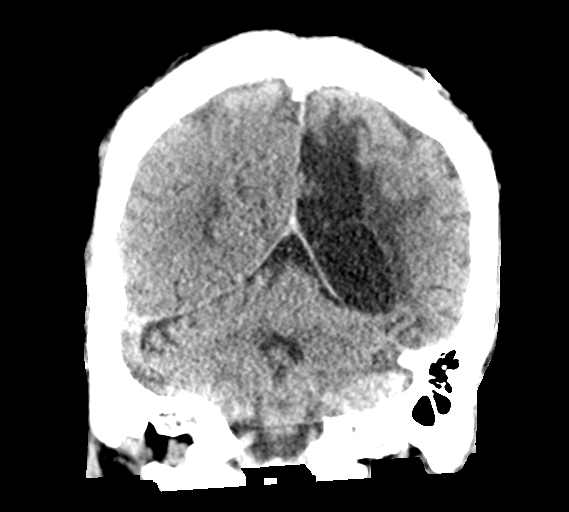
[im 34/77  brain]
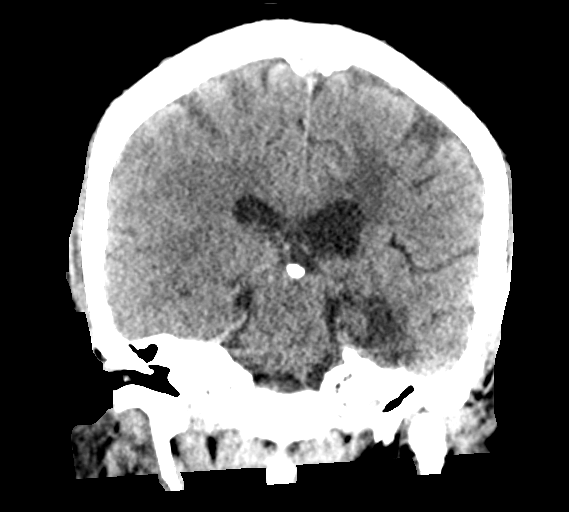
[im 43/77  brain]
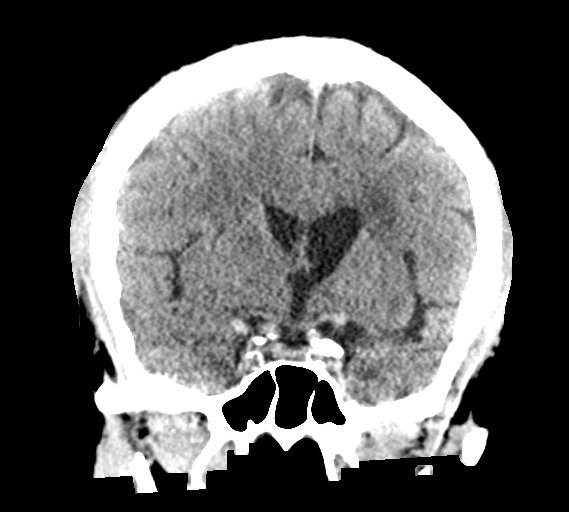

[Series 6: head without sag · sagittal · non-contrast · 0.36mm/px · 3 of 64 slices shown]
[im 22/64  brain]
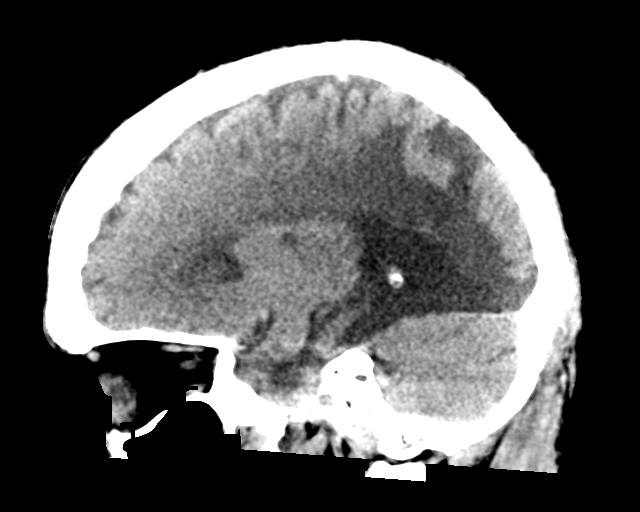
[im 32/64  brain]
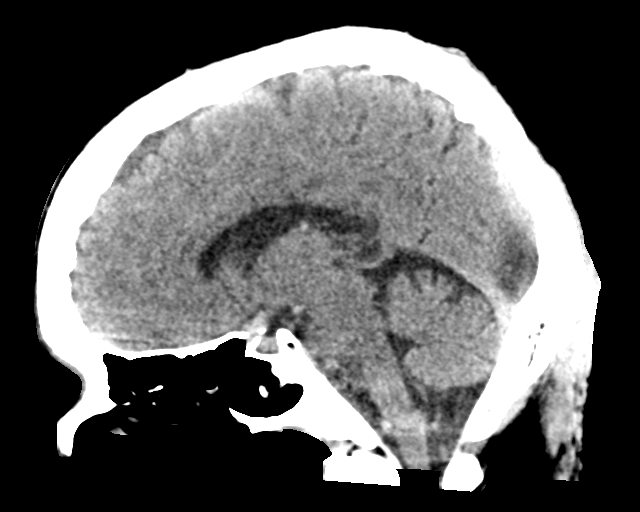
[im 43/64  brain]
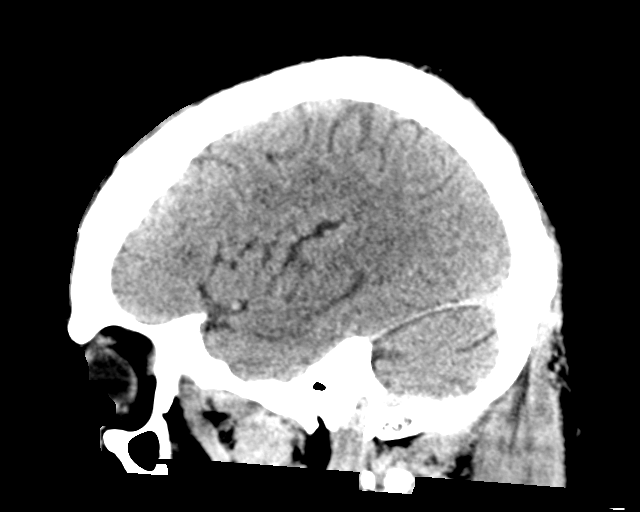

[16 of 47 positions shown; findings below may reference images not displayed]

FINDINGS: Brain: There is no acute intracranial hemorrhage, mass effect, or
edema. Left parieto-occipital and posterior temporal
encephalomalacia again identified with ex vacuo dilatation of the
adjacent left lateral ventricle. Additional areas of infarction in
left thalamus and left basal ganglia and adjacent white matter.
Wallerian degeneration along the left cerebral peduncle. Other
patchy hypoattenuation in the supratentorial white matter may
reflect chronic microvascular ischemic changes. There is no
extra-axial collection.

Vascular: There is atherosclerotic calcification at the skull base.

Skull: Calvarium is unremarkable.

Sinuses/Orbits: No acute finding. Chronic deformity of right medial
orbital wall with herniation of orbital fat.

Other: Right scalp hematoma.
IMPRESSION: No evidence of acute intracranial injury. Stable chronic/nonemergent
findings detailed above.
